# Patient Record
Sex: Male | Born: 1960 | Race: White | Hispanic: No | Marital: Married | Smoking: Former smoker
Health system: Southern US, Community
[De-identification: ages and names within clinical notes are randomized; demographics above are authoritative.]

## PROBLEM LIST (undated history)

## (undated) DIAGNOSIS — N200 Calculus of kidney: Secondary | ICD-10-CM

## (undated) DIAGNOSIS — K219 Gastro-esophageal reflux disease without esophagitis: Secondary | ICD-10-CM

## (undated) DIAGNOSIS — H8109 Meniere's disease, unspecified ear: Secondary | ICD-10-CM

## (undated) DIAGNOSIS — I4719 Other supraventricular tachycardia: Secondary | ICD-10-CM

## (undated) DIAGNOSIS — R0609 Other forms of dyspnea: Secondary | ICD-10-CM

## (undated) DIAGNOSIS — D649 Anemia, unspecified: Secondary | ICD-10-CM

## (undated) DIAGNOSIS — H5440 Blindness, one eye, unspecified eye: Secondary | ICD-10-CM

## (undated) DIAGNOSIS — R0600 Dyspnea, unspecified: Secondary | ICD-10-CM

## (undated) DIAGNOSIS — L299 Pruritus, unspecified: Secondary | ICD-10-CM

## (undated) DIAGNOSIS — B351 Tinea unguium: Secondary | ICD-10-CM

## (undated) DIAGNOSIS — I471 Supraventricular tachycardia: Secondary | ICD-10-CM

## (undated) DIAGNOSIS — K602 Anal fissure, unspecified: Secondary | ICD-10-CM

## (undated) DIAGNOSIS — R002 Palpitations: Secondary | ICD-10-CM

## (undated) DIAGNOSIS — R198 Other specified symptoms and signs involving the digestive system and abdomen: Secondary | ICD-10-CM

## (undated) DIAGNOSIS — H33051 Total retinal detachment, right eye: Secondary | ICD-10-CM

## (undated) DIAGNOSIS — R7303 Prediabetes: Secondary | ICD-10-CM

## (undated) DIAGNOSIS — Z8719 Personal history of other diseases of the digestive system: Secondary | ICD-10-CM

## (undated) DIAGNOSIS — M79641 Pain in right hand: Secondary | ICD-10-CM

## (undated) DIAGNOSIS — H9319 Tinnitus, unspecified ear: Secondary | ICD-10-CM

## (undated) DIAGNOSIS — G8929 Other chronic pain: Secondary | ICD-10-CM

## (undated) DIAGNOSIS — E291 Testicular hypofunction: Secondary | ICD-10-CM

## (undated) DIAGNOSIS — I10 Essential (primary) hypertension: Secondary | ICD-10-CM

## (undated) DIAGNOSIS — B356 Tinea cruris: Secondary | ICD-10-CM

## (undated) DIAGNOSIS — E042 Nontoxic multinodular goiter: Secondary | ICD-10-CM

## (undated) DIAGNOSIS — Z6830 Body mass index (BMI) 30.0-30.9, adult: Secondary | ICD-10-CM

## (undated) DIAGNOSIS — H3320 Serous retinal detachment, unspecified eye: Secondary | ICD-10-CM

## (undated) DIAGNOSIS — R079 Chest pain, unspecified: Secondary | ICD-10-CM

## (undated) DIAGNOSIS — N529 Male erectile dysfunction, unspecified: Secondary | ICD-10-CM

## (undated) HISTORY — DX: Pain in right hand: M79.641

## (undated) HISTORY — DX: Total retinal detachment, right eye: H33.051

## (undated) HISTORY — DX: Chest pain, unspecified: R07.9

## (undated) HISTORY — DX: Testicular hypofunction: E29.1

## (undated) HISTORY — DX: Other supraventricular tachycardia: I47.19

## (undated) HISTORY — DX: Male erectile dysfunction, unspecified: N52.9

## (undated) HISTORY — DX: Tinea unguium: B35.1

## (undated) HISTORY — DX: Nontoxic multinodular goiter: E04.2

## (undated) HISTORY — DX: Pruritus, unspecified: L29.9

## (undated) HISTORY — DX: Personal history of other diseases of the digestive system: Z87.19

## (undated) HISTORY — DX: Meniere's disease, unspecified ear: H81.09

## (undated) HISTORY — DX: Prediabetes: R73.03

## (undated) HISTORY — DX: Body mass index (BMI) 30.0-30.9, adult: Z68.30

## (undated) HISTORY — DX: Gastro-esophageal reflux disease without esophagitis: K21.9

## (undated) HISTORY — DX: Anemia, unspecified: D64.9

## (undated) HISTORY — DX: Serous retinal detachment, unspecified eye: H33.20

## (undated) HISTORY — DX: Palpitations: R00.2

## (undated) HISTORY — DX: Tinnitus, unspecified ear: H93.19

## (undated) HISTORY — DX: Other specified symptoms and signs involving the digestive system and abdomen: R19.8

## (undated) HISTORY — PX: APPENDECTOMY: SHX54

## (undated) HISTORY — PX: OTHER SURGICAL HISTORY: SHX169

## (undated) HISTORY — DX: Other forms of dyspnea: R06.09

## (undated) HISTORY — PX: BACK SURGERY: SHX140

## (undated) HISTORY — DX: Dyspnea, unspecified: R06.00

## (undated) HISTORY — DX: Anal fissure, unspecified: K60.2

## (undated) HISTORY — PX: COLONOSCOPY: SHX174

## (undated) HISTORY — DX: Supraventricular tachycardia: I47.1

## (undated) HISTORY — DX: Tinea cruris: B35.6

## (undated) HISTORY — DX: Other chronic pain: G89.29

---

## 1999-05-27 ENCOUNTER — Encounter: Payer: Self-pay | Admitting: Otolaryngology

## 1999-05-27 ENCOUNTER — Ambulatory Visit (HOSPITAL_COMMUNITY): Admission: RE | Admit: 1999-05-27 | Discharge: 1999-05-27 | Payer: Self-pay | Admitting: Otolaryngology

## 1999-11-22 ENCOUNTER — Ambulatory Visit (HOSPITAL_COMMUNITY): Admission: RE | Admit: 1999-11-22 | Discharge: 1999-11-22 | Payer: Self-pay | Admitting: Family Medicine

## 1999-11-22 ENCOUNTER — Encounter: Payer: Self-pay | Admitting: Family Medicine

## 2005-03-24 ENCOUNTER — Emergency Department (HOSPITAL_COMMUNITY): Admission: EM | Admit: 2005-03-24 | Discharge: 2005-03-25 | Payer: Self-pay | Admitting: Emergency Medicine

## 2011-01-05 ENCOUNTER — Encounter: Payer: Self-pay | Admitting: Otolaryngology

## 2012-08-22 ENCOUNTER — Encounter (HOSPITAL_COMMUNITY): Payer: Self-pay | Admitting: Emergency Medicine

## 2012-08-22 ENCOUNTER — Emergency Department (HOSPITAL_COMMUNITY)
Admission: EM | Admit: 2012-08-22 | Discharge: 2012-08-22 | Disposition: A | Discharge status: Home / Self Care | Payer: 59 | Attending: Emergency Medicine | Admitting: Emergency Medicine

## 2012-08-22 ENCOUNTER — Emergency Department (HOSPITAL_COMMUNITY): Payer: 59

## 2012-08-22 DIAGNOSIS — N200 Calculus of kidney: Secondary | ICD-10-CM | POA: Insufficient documentation

## 2012-08-22 DIAGNOSIS — N201 Calculus of ureter: Secondary | ICD-10-CM

## 2012-08-22 DIAGNOSIS — N1330 Unspecified hydronephrosis: Secondary | ICD-10-CM

## 2012-08-22 DIAGNOSIS — R911 Solitary pulmonary nodule: Secondary | ICD-10-CM

## 2012-08-22 LAB — CBC
HCT: 39.3 % (ref 39.0–52.0)
Hemoglobin: 14.2 g/dL (ref 13.0–17.0)
MCH: 31.1 pg (ref 26.0–34.0)
MCHC: 36.1 g/dL — ABNORMAL HIGH (ref 30.0–36.0)
MCV: 86 fL (ref 78.0–100.0)
Platelets: 233 10*3/uL (ref 150–400)
RBC: 4.57 MIL/uL (ref 4.22–5.81)
RDW: 12.1 % (ref 11.5–15.5)
WBC: 11.3 10*3/uL — ABNORMAL HIGH (ref 4.0–10.5)

## 2012-08-22 LAB — URINALYSIS, ROUTINE W REFLEX MICROSCOPIC
Bilirubin Urine: NEGATIVE
Glucose, UA: NEGATIVE mg/dL
Hgb urine dipstick: NEGATIVE
Ketones, ur: NEGATIVE mg/dL
Leukocytes, UA: NEGATIVE
Nitrite: NEGATIVE
Protein, ur: NEGATIVE mg/dL
Specific Gravity, Urine: 1.025 (ref 1.005–1.030)
Urobilinogen, UA: 0.2 mg/dL (ref 0.0–1.0)
pH: 5.5 (ref 5.0–8.0)

## 2012-08-22 LAB — COMPREHENSIVE METABOLIC PANEL
ALT: 16 U/L (ref 0–53)
AST: 18 U/L (ref 0–37)
Albumin: 4.4 g/dL (ref 3.5–5.2)
Alkaline Phosphatase: 48 U/L (ref 39–117)
BUN: 17 mg/dL (ref 6–23)
CO2: 25 mEq/L (ref 19–32)
Calcium: 9.2 mg/dL (ref 8.4–10.5)
Chloride: 100 mEq/L (ref 96–112)
Creatinine, Ser: 1.07 mg/dL (ref 0.50–1.35)
GFR calc Af Amer: >90 mL/min (ref >90)
GFR calc non Af Amer: 78 mL/min — ABNORMAL LOW (ref >90)
Glucose, Bld: 170 mg/dL — ABNORMAL HIGH (ref 70–99)
Potassium: 4 mEq/L (ref 3.5–5.1)
Sodium: 135 mEq/L (ref 135–145)
Total Bilirubin: 0.6 mg/dL (ref 0.3–1.2)
Total Protein: 7 g/dL (ref 6.0–8.3)

## 2012-08-22 LAB — LIPASE, BLOOD: Lipase: 63 U/L — ABNORMAL HIGH (ref 11–59)

## 2012-08-22 MED ORDER — SODIUM CHLORIDE 0.9 % IV SOLN: 1000.0000 mL | Freq: Once | INTRAVENOUS | Status: DC

## 2012-08-22 MED ORDER — FENTANYL CITRATE 0.05 MG/ML IJ SOLN
50.0000 ug | Freq: Once | INTRAMUSCULAR | Status: AC
  Administered 2012-08-22: 50 ug via INTRAVENOUS
  Filled 2012-08-22: qty 2

## 2012-08-22 MED ORDER — PROMETHAZINE HCL 25 MG/ML IJ SOLN
25.0000 mg | Freq: Once | INTRAMUSCULAR | Status: AC
  Administered 2012-08-22: 25 mg via INTRAVENOUS
  Filled 2012-08-22: qty 1

## 2012-08-22 MED ORDER — PROMETHAZINE HCL 25 MG PO TABS: 25.0000 mg | ORAL_TABLET | Freq: Four times a day (QID) | ORAL | Status: DC | PRN

## 2012-08-22 MED ORDER — OXYCODONE-ACETAMINOPHEN 5-325 MG PO TABS: 1.0000 | ORAL_TABLET | ORAL | Status: AC | PRN

## 2012-08-22 MED ORDER — SODIUM CHLORIDE 0.9 % IV SOLN
1000.0000 mL | Freq: Once | INTRAVENOUS | Status: AC
  Administered 2012-08-22: 1000 mL via INTRAVENOUS

## 2012-08-22 MED ORDER — TAMSULOSIN HCL 0.4 MG PO CAPS: 0.4000 mg | ORAL_CAPSULE | Freq: Every day | ORAL | Status: DC

## 2012-08-22 MED ORDER — HYDROMORPHONE HCL PF 1 MG/ML IJ SOLN
1.0000 mg | Freq: Once | INTRAMUSCULAR | Status: AC
  Administered 2012-08-22: 1 mg via INTRAVENOUS
  Filled 2012-08-22: qty 1

## 2012-08-22 MED ORDER — SODIUM CHLORIDE 0.9 % IV SOLN: 1000.0000 mL | INTRAVENOUS | Status: DC

## 2012-08-22 MED ORDER — OXYCODONE-ACETAMINOPHEN 5-325 MG PO TABS
2.0000 | ORAL_TABLET | Freq: Once | ORAL | Status: DC
  Filled 2012-08-22: qty 2

## 2012-08-22 MED ORDER — ONDANSETRON HCL 4 MG/2ML IJ SOLN
4.0000 mg | Freq: Once | INTRAMUSCULAR | Status: AC
  Administered 2012-08-22: 4 mg via INTRAVENOUS
  Filled 2012-08-22: qty 2

## 2012-08-22 NOTE — ED Notes (Signed)
Family at bedside. 

## 2012-08-22 NOTE — ED Provider Notes (Signed)
7:17 AM Assumed care of patient at change of shift.  Sign out received from Marlon Pel, PA-C.  Pt presenting with acute onset right testicular pain, US shows hydroceles and varicoceles.  Patient reports continued pain, have ordered dilaudid and phenergan.  States results to this point have been discussed with them.  Pending CT to r/o ureteral stone.   8:14 AM Pt resting comfortably, sleeping.  Discussed all results with wife and patient (though patient had difficulty staying awake).  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  Discussed return precautions with wife who verbalizes understanding and agrees with plan.    Results for orders placed during the hospital encounter of 08/22/12  URINALYSIS, ROUTINE W REFLEX MICROSCOPIC      Component Value Range   Color, Urine YELLOW  YELLOW   APPearance CLOUDY (*) CLEAR   Specific Gravity, Urine 1.025  1.005 - 1.030   pH 5.5  5.0 - 8.0   Glucose, UA NEGATIVE  NEGATIVE mg/dL   Hgb urine dipstick NEGATIVE  NEGATIVE   Bilirubin Urine NEGATIVE  NEGATIVE   Ketones, ur NEGATIVE  NEGATIVE mg/dL   Protein, ur NEGATIVE  NEGATIVE mg/dL   Urobilinogen, UA 0.2  0.0 - 1.0 mg/dL   Nitrite NEGATIVE  NEGATIVE   Leukocytes, UA NEGATIVE  NEGATIVE  CBC      Component Value Range   WBC 11.3 (*) 4.0 - 10.5 K/uL   RBC 4.57  4.22 - 5.81 MIL/uL   Hemoglobin 14.2  13.0 - 17.0 g/dL   HCT 91.4  78.2 - 95.6 %   MCV 86.0  78.0 - 100.0 fL   MCH 31.1  26.0 - 34.0 pg   MCHC 36.1 (*) 30.0 - 36.0 g/dL   RDW 21.3  08.6 - 57.8 %   Platelets 233  150 - 400 K/uL  COMPREHENSIVE METABOLIC PANEL      Component Value Range   Sodium 135  135 - 145 mEq/L   Potassium 4.0  3.5 - 5.1 mEq/L   Chloride 100  96 - 112 mEq/L   CO2 25  19 - 32 mEq/L   Glucose, Bld 170 (*) 70 - 99 mg/dL   BUN 17  6 - 23 mg/dL   Creatinine, Ser 4.69  0.50 - 1.35 mg/dL   Calcium 9.2  8.4 - 62.9 mg/dL     Total Protein 7.0  6.0 - 8.3 g/dL   Albumin 4.4  3.5 - 5.2 g/dL   AST 18  0 - 37 U/L   ALT 16  0 - 53 U/L   Alkaline Phosphatase 48  39 - 117 U/L   Total Bilirubin 0.6  0.3 - 1.2 mg/dL   GFR calc non Af Amer 78 (*) >90 mL/min   GFR calc Af Amer >90  >90 mL/min  LIPASE, BLOOD      Component Value Range   Lipase 63 (*) 11 - 59 U/L   Ct Abdomen Pelvis Wo Contrast  08/22/2012  *RADIOLOGY REPORT*  Clinical Data: Right testicular pain, vomiting, history of kidney stones  CT ABDOMEN AND PELVIS WITHOUT CONTRAST  Technique:  Multidetector CT imaging of the abdomen and pelvis was performed following the standard protocol without intravenous contrast.  Comparison: Testicular ultrasound performed today, 08/22/2012  Findings:  Lower Chest:  There is a 5 mm subpleural nodule in the right middle lobe on image 5  of series 6. The visualized lung bases are otherwise clear.  Visualized cardiac structures unremarkable.  Abdomen: Evaluation of the abdominal organs is limited in the absence of intravenous contrast material.  Given these limitations, the CT appearance of the stomach, duodenum, spleen, pancreas, adrenal glands, and liver are unremarkable. Gallbladder is unremarkable. No intra or extrahepatic biliary ductal dilatation.  Moderate right hydronephrosis with interstitial stranding in the perinephric space and around the renal pelvis.  There is mild ureterectasis leading up to with a 4 mm stone in the distal ureter just proximal to the UVJ.  No additional radiopaque renal calculi identified in the right kidney.  There are several small stones in the left kidney including a 3 mm stone in the upper pole collecting system and a tiny 1 - 2 mm stone in the interpolar collecting system.  There is mild - moderate left pelvicaliectasis without associated ureteral nephrosis. Some of the left pelvicaliectasis may in fact represent renal sinus cysts.  The ureter is normal caliber and no evidence of ureteral stone.   Normal-caliber large and small bowel throughout the abdomen without evidence of obstruction.  No significant colonic diverticular disease.  No free fluid in the abdomen, and no suspicious adenopathy.  Pelvis: 4 mm obstructing distal right ureteral stone just proximal to the UVJ.  The bladder is unremarkable in appearance as are the seminal vesicles.  Small central calcification in the prostate.  No free fluid or suspicious adenopathy in the pelvis.  Bones: No acute fracture or aggressive appearing lytic or blastic osseous lesion. Multilevel degenerative disc disease, most significant at L1-L2 where there is focal loss of disc space height, and reversal of the normal lumbar lordosis.  Vascular: Trace atherosclerotic vascular disease.  Evaluation significantly limited in the absence of intravenous contrast.  IMPRESSION:  1.  Obstructing 4 mm distal right ureteral stone with associated moderate hydronephrosis.  2.  Nonobstructing renal calculi in the left kidney measure up to 3 mm  3.  Query mild - moderate left hydronephrosis which may be chronic given the absence of associated ureteral dilatation.  Evaluation complicated by the presence of several renal sinus cysts.  In the absence of intravenous contrast, it is difficult to separate the renal sinus cysts from the collecting system.  4. A 5 mm sub pleural pulmonary nodule in the right middle lobe is highly likely to represent a subpleural lymph node.  However, additional follow-up is recommended to document stability.  If the patient is at high risk for bronchogenic carcinoma, follow-up chest CT at 6-12 months is recommended.  If the patient is at low risk for bronchogenic carcinoma, follow-up chest CT at 12 months is recommended.  This recommendation follows the consensus statement: Guidelines for Management of Small Pulmonary Nodules Detected on CT Scans: A Statement from the Fleischner Society as published in Radiology 2005; 237:395-400.   5.  Multilevel  degenerative disc disease in the lumbar spine most significant at L1-L2 and L2-L3.  6.  Trace atherosclerotic disease.   Original Report Authenticated By: Vilma Prader    US Scrotum  08/22/2012  *RADIOLOGY REPORT*  Clinical Data:  Right testicular pain.  No injury.  SCROTAL ULTRASOUND DOPPLER ULTRASOUND OF THE TESTICLES  Technique: Complete ultrasound examination of the testicles, epididymis, and other scrotal structures was performed.  Color and spectral Doppler ultrasound were also utilized to evaluate blood flow to the testicles.  Comparison:  None.  Findings:  Right testis:  The right testis measures 5.2 x 2.8 x 3.3 cm. Normal  homogeneous parenchymal echotexture.  Flow is demonstrated within the testis on color flow Doppler imaging.  No focal mass lesions.  Left testis:  The left testis measures 4.8 x 2.6 x 3.4 cm.  Normal homogeneous parenchymal echotexture.  Flow is demonstrated within the left testis on color flow Doppler imaging.  No focal mass lesions.  Right epididymis:  3 mm diameter right epididymal cyst or spermatic seal.  Low flow.  Left epididymis:  Normal in size and appearance.  Hydrocele:  Bilateral scrotal hydroceles are demonstrated.  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  Pulsed Doppler interrogation of both testes demonstrates low resistance flow bilaterally.  IMPRESSION: No evidence of testicular mass or torsion.  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   Original Report Authenticated By: Marlon Pel, M.D.    [place] Art/ven Flow Abd Pelv Doppler  08/22/2012  *RADIOLOGY REPORT*  Clinical Data:  Right testicular pain.  No injury.  SCROTAL ULTRASOUND DOPPLER ULTRASOUND OF THE TESTICLES  Technique: Complete ultrasound examination of the testicles, epididymis, and other scrotal structures was performed.  Color and spectral Doppler ultrasound were also utilized to evaluate blood flow to the testicles.  Comparison:  None.  Findings:  Right testis:   The right testis measures 5.2 x 2.8 x 3.3 cm. Normal homogeneous parenchymal echotexture.  Flow is demonstrated within the testis on color flow Doppler imaging.  No focal mass lesions.  Left testis:  The left testis measures 4.8 x 2.6 x 3.4 cm.  Normal homogeneous parenchymal echotexture.  Flow is demonstrated within the left testis on color flow Doppler imaging.  No focal mass lesions.  Right epididymis:  3 mm diameter right epididymal cyst or spermatic seal.  Low flow.  Left epididymis:  Normal in size and appearance.  Hydrocele:  Bilateral scrotal hydroceles are demonstrated.  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  Pulsed Doppler interrogation of both testes demonstrates low resistance flow bilaterally.  IMPRESSION: No evidence of testicular mass or torsion.  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   Original Report Authenticated By: Marlon Pel, M.D.       [place], [place] 08/22/12 [phone]

## 2012-08-22 NOTE — ED Notes (Signed)
Patient Ultrasound completed at bedside

## 2012-08-22 NOTE — ED Provider Notes (Signed)
Medical screening examination/treatment/procedure(s) were performed by non-physician practitioner and as supervising physician I was immediately available for consultation/collaboration.  Ambria Mayfield, MD 08/22/12 0718 

## 2012-08-22 NOTE — ED Notes (Signed)
Pt alert, arrives from home, c/o right testicle pain, onset was this evening, states radiates down right leg, resp even unlabored, skin pwd

## 2012-08-22 NOTE — ED Provider Notes (Signed)
History     CSN: [id number]  Arrival date & time 08/22/12  0224   First MD Initiated Contact with Patient 08/22/12 [phone]      Chief Complaint  Patient presents with  . Groin Pain    (Consider location/radiation/quality/duration/timing/severity/associated sxs/prior treatment) HPI  Pt to the ER from home after acute onset of right side testicular pain that woke him up from his sleep at midnight. He has had two episodes of vomiting. He feels as though his testicle is being pulled up into his abdomen. Normal bowel movement, no dysuria, no hematuria. His testicle does not hurt when palpated. Pt is uncomfortable but is in NAD at this time.  History reviewed. No pertinent past medical history.  History reviewed. No pertinent past surgical history.  No family history on file.  History  Substance Use Topics  . Smoking status: Never Smoker   . Smokeless tobacco: Not on file  . Alcohol Use: No      Review of Systems   Review of Systems  Gen: no weight loss, fevers, chills, night sweats  Eyes: no discharge or drainage, no occular pain or visual changes  Nose: no epistaxis or rhinorrhea  Mouth: no dental pain, no sore throat  Neck: no neck pain  Lungs:No wheezing, coughing or hemoptysis CV: no chest pain, palpitations, dependent edema or orthopnea  Abd: no abdominal pain, nausea, + vomiting  GU: no dysuria or gross hematuria, + testicular pain MSK:  No abnormalities  Neuro: no headache, no focal neurologic deficits  Skin: no abnormalities Psyche: negative.    Allergies  Review of patient's allergies indicates no known allergies.  Home Medications   Current Outpatient Rx  Name Route Sig Dispense Refill  . LISINOPRIL 10 MG PO TABS Oral Take 10 mg by mouth daily.      BP 119/75  Pulse 83  Temp 98.5 F (36.9 C) (Oral)  Resp 16  Wt 206 lb (93.441 kg)  SpO2 99%  Physical Exam  Nursing note and vitals reviewed. Constitutional: He appears well-developed and  well-nourished. No distress.  HENT:  Head: Normocephalic and atraumatic.  Eyes: Pupils are equal, round, and reactive to light.  Neck: Normal range of motion. Neck supple.  Cardiovascular: Normal rate and regular rhythm.   Pulmonary/Chest: Effort normal.  Abdominal: Soft.  Genitourinary: Testes normal and penis normal. Right testis shows no mass, no swelling and no tenderness. Right testis is descended. Cremasteric reflex is not absent on the right side. Left testis shows no mass, no swelling and no tenderness. Left testis is descended. Cremasteric reflex is not absent on the left side.  Neurological: He is alert.  Skin: Skin is warm and dry.    ED Course  Procedures (including critical care time)  Labs Reviewed  URINALYSIS, ROUTINE W REFLEX MICROSCOPIC - Abnormal; Notable for the following:    APPearance CLOUDY (*)     All other components within normal limits  CBC - Abnormal; Notable for the following:    WBC 11.3 (*)     MCHC 36.1 (*)     All other components within normal limits  COMPREHENSIVE METABOLIC PANEL - Abnormal; Notable for the following:    Glucose, Bld 170 (*)     GFR calc non Af Amer 78 (*)     All other components within normal limits  LIPASE, BLOOD - Abnormal; Notable for the following:    Lipase 63 (*)     All other components within normal limits   US Scrotum  08/22/2012  *RADIOLOGY REPORT*  Clinical Data:  Right testicular pain.  No injury.  SCROTAL ULTRASOUND DOPPLER ULTRASOUND OF THE TESTICLES  Technique: Complete ultrasound examination of the testicles, epididymis, and other scrotal structures was performed.  Color and spectral Doppler ultrasound were also utilized to evaluate blood flow to the testicles.  Comparison:  None.  Findings:  Right testis:  The right testis measures 5.2 x 2.8 x 3.3 cm. Normal homogeneous parenchymal echotexture.  Flow is demonstrated within the testis on color flow Doppler imaging.  No focal mass lesions.  Left testis:  The left  testis measures 4.8 x 2.6 x 3.4 cm.  Normal homogeneous parenchymal echotexture.  Flow is demonstrated within the left testis on color flow Doppler imaging.  No focal mass lesions.  Right epididymis:  3 mm diameter right epididymal cyst or spermatic seal.  Low flow.  Left epididymis:  Normal in size and appearance.  Hydrocele:  Bilateral scrotal hydroceles are demonstrated.  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  Pulsed Doppler interrogation of both testes demonstrates low resistance flow bilaterally.  IMPRESSION: No evidence of testicular mass or torsion.  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   Original Report Authenticated By: Marlon Pel, M.D.    [place] Art/ven Flow Abd Pelv Doppler  08/22/2012  *RADIOLOGY REPORT*  Clinical Data:  Right testicular pain.  No injury.  SCROTAL ULTRASOUND DOPPLER ULTRASOUND OF THE TESTICLES  Technique: Complete ultrasound examination of the testicles, epididymis, and other scrotal structures was performed.  Color and spectral Doppler ultrasound were also utilized to evaluate blood flow to the testicles.  Comparison:  None.  Findings:  Right testis:  The right testis measures 5.2 x 2.8 x 3.3 cm. Normal homogeneous parenchymal echotexture.  Flow is demonstrated within the testis on color flow Doppler imaging.  No focal mass lesions.  Left testis:  The left testis measures 4.8 x 2.6 x 3.4 cm.  Normal homogeneous parenchymal echotexture.  Flow is demonstrated within the left testis on color flow Doppler imaging.  No focal mass lesions.  Right epididymis:  3 mm diameter right epididymal cyst or spermatic seal.  Low flow.  Left epididymis:  Normal in size and appearance.  Hydrocele:  Bilateral scrotal hydroceles are demonstrated.  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  Pulsed Doppler interrogation of both testes demonstrates low resistance flow bilaterally.  IMPRESSION: No evidence of testicular mass or  torsion.  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   Original Report Authenticated By: Marlon Pel, M.D.      No diagnosis found.    MDM  After patient evaluated, STAT ultrasound of the testicle ordered.   Ultrasound of the testicles have come back normal, pt still in unbearable pain. CT abd/pelv wo contrast ordered to r/o atypical presentation of kidney stone.   End of shift patient hand off to [facility], PA-C. Pt needs Urology referral regardless of results.       Dorthula Matas, PA 08/22/12 [phone]

## 2012-08-23 NOTE — ED Provider Notes (Signed)
Medical screening examination/treatment/procedure(s) were performed by non-physician practitioner and as supervising physician I was immediately available for consultation/collaboration.  Mele Sylvester, MD 08/23/12 0049 

## 2012-08-24 ENCOUNTER — Inpatient Hospital Stay (HOSPITAL_COMMUNITY)
Admission: EM | Admit: 2012-08-24 | Discharge: 2012-08-27 | DRG: 854 | Disposition: A | Discharge status: Home / Self Care | Payer: 59 | Attending: Internal Medicine | Admitting: Internal Medicine

## 2012-08-24 ENCOUNTER — Inpatient Hospital Stay (HOSPITAL_COMMUNITY): Payer: 59 | Admitting: Anesthesiology

## 2012-08-24 ENCOUNTER — Encounter (HOSPITAL_COMMUNITY): Payer: Self-pay | Admitting: Anesthesiology

## 2012-08-24 ENCOUNTER — Emergency Department (HOSPITAL_COMMUNITY): Payer: 59

## 2012-08-24 ENCOUNTER — Other Ambulatory Visit: Payer: Self-pay | Admitting: Urology

## 2012-08-24 ENCOUNTER — Encounter (HOSPITAL_COMMUNITY): Payer: Self-pay

## 2012-08-24 ENCOUNTER — Encounter (HOSPITAL_COMMUNITY)
Admission: EM | Disposition: A | Discharge status: Home / Self Care | Payer: Self-pay | Source: Home / Self Care | Attending: Internal Medicine

## 2012-08-24 DIAGNOSIS — R6510 Systemic inflammatory response syndrome (SIRS) of non-infectious origin without acute organ dysfunction: Secondary | ICD-10-CM | POA: Diagnosis present

## 2012-08-24 DIAGNOSIS — E871 Hypo-osmolality and hyponatremia: Secondary | ICD-10-CM | POA: Diagnosis present

## 2012-08-24 DIAGNOSIS — N289 Disorder of kidney and ureter, unspecified: Secondary | ICD-10-CM

## 2012-08-24 DIAGNOSIS — Z9089 Acquired absence of other organs: Secondary | ICD-10-CM

## 2012-08-24 DIAGNOSIS — N1330 Unspecified hydronephrosis: Secondary | ICD-10-CM | POA: Diagnosis present

## 2012-08-24 DIAGNOSIS — A419 Sepsis, unspecified organism: Principal | ICD-10-CM | POA: Diagnosis present

## 2012-08-24 DIAGNOSIS — N390 Urinary tract infection, site not specified: Secondary | ICD-10-CM | POA: Diagnosis present

## 2012-08-24 DIAGNOSIS — I959 Hypotension, unspecified: Secondary | ICD-10-CM | POA: Clinically undetermined

## 2012-08-24 DIAGNOSIS — R109 Unspecified abdominal pain: Secondary | ICD-10-CM

## 2012-08-24 DIAGNOSIS — I10 Essential (primary) hypertension: Secondary | ICD-10-CM

## 2012-08-24 DIAGNOSIS — R000 Tachycardia, unspecified: Secondary | ICD-10-CM

## 2012-08-24 DIAGNOSIS — N201 Calculus of ureter: Secondary | ICD-10-CM | POA: Diagnosis present

## 2012-08-24 DIAGNOSIS — D72829 Elevated white blood cell count, unspecified: Secondary | ICD-10-CM | POA: Diagnosis present

## 2012-08-24 DIAGNOSIS — R1110 Vomiting, unspecified: Secondary | ICD-10-CM

## 2012-08-24 DIAGNOSIS — N179 Acute kidney failure, unspecified: Secondary | ICD-10-CM

## 2012-08-24 DIAGNOSIS — K5900 Constipation, unspecified: Secondary | ICD-10-CM | POA: Diagnosis present

## 2012-08-24 DIAGNOSIS — N200 Calculus of kidney: Secondary | ICD-10-CM | POA: Diagnosis present

## 2012-08-24 DIAGNOSIS — N132 Hydronephrosis with renal and ureteral calculous obstruction: Secondary | ICD-10-CM | POA: Diagnosis present

## 2012-08-24 DIAGNOSIS — Z79899 Other long term (current) drug therapy: Secondary | ICD-10-CM

## 2012-08-24 DIAGNOSIS — Z87442 Personal history of urinary calculi: Secondary | ICD-10-CM

## 2012-08-24 DIAGNOSIS — H5440 Blindness, one eye, unspecified eye: Secondary | ICD-10-CM | POA: Diagnosis present

## 2012-08-24 HISTORY — DX: Calculus of kidney: N20.0

## 2012-08-24 HISTORY — DX: Essential (primary) hypertension: I10

## 2012-08-24 HISTORY — PX: CYSTOSCOPY W/ URETERAL STENT PLACEMENT: SHX1429

## 2012-08-24 HISTORY — PX: CYSTOSCOPY/RETROGRADE/URETEROSCOPY: SHX5316

## 2012-08-24 HISTORY — DX: Blindness, one eye, unspecified eye: H54.40

## 2012-08-24 LAB — CBC WITH DIFFERENTIAL/PLATELET
Basophils Absolute: 0 10*3/uL (ref 0.0–0.1)
Basophils Relative: 0 % (ref 0–1)
Eosinophils Absolute: 0 10*3/uL (ref 0.0–0.7)
Eosinophils Relative: 0 % (ref 0–5)
HCT: 38.7 % — ABNORMAL LOW (ref 39.0–52.0)
Hemoglobin: 14.1 g/dL (ref 13.0–17.0)
Lymphocytes Relative: 4 % — ABNORMAL LOW (ref 12–46)
Lymphs Abs: 1 10*3/uL (ref 0.7–4.0)
MCH: 30.7 pg (ref 26.0–34.0)
MCHC: 36.4 g/dL — ABNORMAL HIGH (ref 30.0–36.0)
MCV: 84.1 fL (ref 78.0–100.0)
Monocytes Absolute: 3.2 10*3/uL — ABNORMAL HIGH (ref 0.1–1.0)
Monocytes Relative: 12 % (ref 3–12)
Neutro Abs: 22.7 10*3/uL — ABNORMAL HIGH (ref 1.7–7.7)
Neutrophils Relative %: 85 % — ABNORMAL HIGH (ref 43–77)
Platelets: 262 10*3/uL (ref 150–400)
RBC: 4.6 MIL/uL (ref 4.22–5.81)
RDW: 12.3 % (ref 11.5–15.5)
WBC: 26.9 10*3/uL — ABNORMAL HIGH (ref 4.0–10.5)

## 2012-08-24 LAB — URINALYSIS, ROUTINE W REFLEX MICROSCOPIC
Bilirubin Urine: NEGATIVE
Glucose, UA: NEGATIVE mg/dL
Ketones, ur: NEGATIVE mg/dL
Leukocytes, UA: NEGATIVE
Nitrite: NEGATIVE
Protein, ur: NEGATIVE mg/dL
Specific Gravity, Urine: 1.024 (ref 1.005–1.030)
Urobilinogen, UA: 0.2 mg/dL (ref 0.0–1.0)
pH: 6 (ref 5.0–8.0)

## 2012-08-24 LAB — MAGNESIUM: Magnesium: 2 mg/dL (ref 1.5–2.5)

## 2012-08-24 LAB — BASIC METABOLIC PANEL
BUN: 21 mg/dL (ref 6–23)
CO2: 25 mEq/L (ref 19–32)
Calcium: 9.1 mg/dL (ref 8.4–10.5)
Chloride: 88 mEq/L — ABNORMAL LOW (ref 96–112)
Creatinine, Ser: 1.38 mg/dL — ABNORMAL HIGH (ref 0.50–1.35)
GFR calc Af Amer: 66 mL/min — ABNORMAL LOW (ref >90)
GFR calc non Af Amer: 57 mL/min — ABNORMAL LOW (ref >90)
Glucose, Bld: 140 mg/dL — ABNORMAL HIGH (ref 70–99)
Potassium: 4.2 mEq/L (ref 3.5–5.1)
Sodium: 123 mEq/L — ABNORMAL LOW (ref 135–145)

## 2012-08-24 LAB — PHOSPHORUS: Phosphorus: 3.3 mg/dL (ref 2.3–4.6)

## 2012-08-24 LAB — TSH: TSH: 0.342 u[IU]/mL — ABNORMAL LOW (ref 0.350–4.500)

## 2012-08-24 LAB — MRSA PCR SCREENING: MRSA by PCR: NEGATIVE

## 2012-08-24 LAB — URINE MICROSCOPIC-ADD ON

## 2012-08-24 SURGERY — CYSTOSCOPY, WITH RETROGRADE PYELOGRAM AND URETERAL STENT INSERTION
Anesthesia: General | Site: Abdomen | Laterality: Right | Wound class: Clean Contaminated

## 2012-08-24 SURGERY — Surgical Case
Anesthesia: *Unknown

## 2012-08-24 MED ORDER — EPHEDRINE SULFATE 50 MG/ML IJ SOLN
INTRAMUSCULAR | Status: DC | PRN
  Administered 2012-08-24 (×2): 5 mg via INTRAVENOUS

## 2012-08-24 MED ORDER — SODIUM CHLORIDE 0.9 % IV SOLN: INTRAVENOUS | Status: DC

## 2012-08-24 MED ORDER — ONDANSETRON HCL 4 MG/2ML IJ SOLN
INTRAMUSCULAR | Status: DC | PRN
  Administered 2012-08-24: 4 mg via INTRAVENOUS

## 2012-08-24 MED ORDER — SENNA 8.6 MG PO TABS
1.0000 | ORAL_TABLET | Freq: Every day | ORAL | Status: DC | PRN
  Administered 2012-08-25: 8.6 mg via ORAL
  Filled 2012-08-24: qty 1

## 2012-08-24 MED ORDER — SODIUM CHLORIDE 0.9 % IJ SOLN
3.0000 mL | Freq: Two times a day (BID) | INTRAMUSCULAR | Status: DC
Start: 2012-08-24 — End: 2012-08-27

## 2012-08-24 MED ORDER — MORPHINE SULFATE 4 MG/ML IJ SOLN
4.0000 mg | Freq: Once | INTRAMUSCULAR | Status: AC
  Administered 2012-08-24: 4 mg via INTRAVENOUS
  Filled 2012-08-24: qty 1

## 2012-08-24 MED ORDER — MORPHINE SULFATE 2 MG/ML IJ SOLN: 1.0000 mg | INTRAMUSCULAR | Status: DC | PRN

## 2012-08-24 MED ORDER — BELLADONNA ALKALOIDS-OPIUM 16.2-60 MG RE SUPP
RECTAL | Status: AC
  Filled 2012-08-24: qty 1

## 2012-08-24 MED ORDER — LACTATED RINGERS IV SOLN
INTRAVENOUS | Status: DC
Start: 2012-08-24 — End: 2012-08-25

## 2012-08-24 MED ORDER — ONDANSETRON HCL 4 MG/2ML IJ SOLN: 4.0000 mg | Freq: Four times a day (QID) | INTRAMUSCULAR | Status: DC | PRN

## 2012-08-24 MED ORDER — DIPHENHYDRAMINE HCL 50 MG/ML IJ SOLN: 12.5000 mg | Freq: Four times a day (QID) | INTRAMUSCULAR | Status: DC | PRN

## 2012-08-24 MED ORDER — ACETAMINOPHEN 650 MG RE SUPP: 650.0000 mg | Freq: Four times a day (QID) | RECTAL | Status: DC | PRN

## 2012-08-24 MED ORDER — DEXTROSE 5 % IV SOLN
1.0000 g | Freq: Once | INTRAVENOUS | Status: AC
  Administered 2012-08-24: 1 g via INTRAVENOUS
  Filled 2012-08-24: qty 10

## 2012-08-24 MED ORDER — LIDOCAINE HCL 2 % EX GEL
CUTANEOUS | Status: AC
  Filled 2012-08-24: qty 10

## 2012-08-24 MED ORDER — PROPOFOL 10 MG/ML IV BOLUS
INTRAVENOUS | Status: DC | PRN
  Administered 2012-08-24: 170 mg via INTRAVENOUS
  Administered 2012-08-24: 30 mg via INTRAVENOUS

## 2012-08-24 MED ORDER — DEXTROSE-NACL 5-0.45 % IV SOLN
INTRAVENOUS | Status: DC
  Administered 2012-08-24: 100 mL/h via INTRAVENOUS
  Administered 2012-08-25: 06:00:00 via INTRAVENOUS

## 2012-08-24 MED ORDER — FENTANYL CITRATE 0.05 MG/ML IJ SOLN: 25.0000 ug | INTRAMUSCULAR | Status: DC | PRN

## 2012-08-24 MED ORDER — DOCUSATE SODIUM 100 MG PO CAPS
100.0000 mg | ORAL_CAPSULE | Freq: Every day | ORAL | Status: DC | PRN
  Filled 2012-08-24: qty 1

## 2012-08-24 MED ORDER — SODIUM CHLORIDE 0.9 % IV BOLUS (SEPSIS)
1000.0000 mL | Freq: Once | INTRAVENOUS | Status: AC
  Administered 2012-08-24: 1000 mL via INTRAVENOUS

## 2012-08-24 MED ORDER — ZOLPIDEM TARTRATE 5 MG PO TABS: 5.0000 mg | ORAL_TABLET | Freq: Every evening | ORAL | Status: DC | PRN

## 2012-08-24 MED ORDER — ACETAMINOPHEN 10 MG/ML IV SOLN
1000.0000 mg | Freq: Four times a day (QID) | INTRAVENOUS | Status: AC
  Administered 2012-08-25 (×4): 1000 mg via INTRAVENOUS
  Filled 2012-08-24 (×4): qty 100

## 2012-08-24 MED ORDER — IOHEXOL 300 MG/ML  SOLN
INTRAMUSCULAR | Status: AC
  Filled 2012-08-24: qty 1

## 2012-08-24 MED ORDER — FENTANYL CITRATE 0.05 MG/ML IJ SOLN
INTRAMUSCULAR | Status: DC | PRN
  Administered 2012-08-24: 50 ug via INTRAVENOUS
  Administered 2012-08-24: 100 ug via INTRAVENOUS

## 2012-08-24 MED ORDER — 0.9 % SODIUM CHLORIDE (POUR BTL) OPTIME
TOPICAL | Status: DC | PRN
  Administered 2012-08-24: 1000 mL

## 2012-08-24 MED ORDER — KCL IN DEXTROSE-NACL 20-5-0.45 MEQ/L-%-% IV SOLN: INTRAVENOUS | Status: DC

## 2012-08-24 MED ORDER — IBUPROFEN 200 MG PO TABS
600.0000 mg | ORAL_TABLET | Freq: Once | ORAL | Status: DC
  Filled 2012-08-24: qty 3

## 2012-08-24 MED ORDER — ONDANSETRON HCL 4 MG/2ML IJ SOLN
4.0000 mg | Freq: Once | INTRAMUSCULAR | Status: AC
  Administered 2012-08-24: 4 mg via INTRAVENOUS
  Filled 2012-08-24: qty 2

## 2012-08-24 MED ORDER — BELLADONNA ALKALOIDS-OPIUM 16.2-60 MG RE SUPP
RECTAL | Status: DC | PRN
  Administered 2012-08-24: 1 via RECTAL

## 2012-08-24 MED ORDER — MEPERIDINE HCL 50 MG/ML IJ SOLN: 6.2500 mg | INTRAMUSCULAR | Status: DC | PRN

## 2012-08-24 MED ORDER — SODIUM CHLORIDE 0.9 % IV SOLN
INTRAVENOUS | Status: DC | PRN
  Administered 2012-08-24 (×2): via INTRAVENOUS

## 2012-08-24 MED ORDER — KETOROLAC TROMETHAMINE 30 MG/ML IJ SOLN
30.0000 mg | Freq: Four times a day (QID) | INTRAMUSCULAR | Status: DC
  Administered 2012-08-24 – 2012-08-27 (×10): 30 mg via INTRAVENOUS
  Filled 2012-08-24 (×16): qty 1

## 2012-08-24 MED ORDER — SODIUM CHLORIDE 0.9 % IV SOLN
INTRAVENOUS | Status: AC
  Administered 2012-08-24: 16:00:00 via INTRAVENOUS

## 2012-08-24 MED ORDER — PROMETHAZINE HCL 25 MG/ML IJ SOLN: 6.2500 mg | INTRAMUSCULAR | Status: DC | PRN

## 2012-08-24 MED ORDER — OXYBUTYNIN CHLORIDE 5 MG PO TABS
5.0000 mg | ORAL_TABLET | Freq: Three times a day (TID) | ORAL | Status: DC | PRN
  Filled 2012-08-24: qty 1

## 2012-08-24 MED ORDER — CEFTRIAXONE SODIUM 1 G IJ SOLR
1.0000 g | INTRAMUSCULAR | Status: DC
  Administered 2012-08-25 – 2012-08-26 (×2): 1 g via INTRAVENOUS
  Filled 2012-08-24 (×3): qty 10

## 2012-08-24 MED ORDER — LIDOCAINE HCL (CARDIAC) 20 MG/ML IV SOLN
INTRAVENOUS | Status: DC | PRN
  Administered 2012-08-24: 80 mg via INTRAVENOUS

## 2012-08-24 MED ORDER — ACETAMINOPHEN 10 MG/ML IV SOLN
1000.0000 mg | Freq: Four times a day (QID) | INTRAVENOUS | Status: DC
  Administered 2012-08-24: 1000 mg via INTRAVENOUS
  Filled 2012-08-24 (×4): qty 100

## 2012-08-24 MED ORDER — ACETAMINOPHEN 325 MG PO TABS: 650.0000 mg | ORAL_TABLET | Freq: Four times a day (QID) | ORAL | Status: DC | PRN

## 2012-08-24 MED ORDER — KETOROLAC TROMETHAMINE 30 MG/ML IJ SOLN
INTRAMUSCULAR | Status: DC | PRN
  Administered 2012-08-24: 30 mg via INTRAVENOUS

## 2012-08-24 MED ORDER — HYDROMORPHONE HCL PF 1 MG/ML IJ SOLN: 0.5000 mg | INTRAMUSCULAR | Status: DC | PRN

## 2012-08-24 MED ORDER — DIPHENHYDRAMINE HCL 12.5 MG/5ML PO ELIX: 12.5000 mg | ORAL_SOLUTION | Freq: Four times a day (QID) | ORAL | Status: DC | PRN

## 2012-08-24 MED ORDER — SUCCINYLCHOLINE CHLORIDE 20 MG/ML IJ SOLN
INTRAMUSCULAR | Status: DC | PRN
  Administered 2012-08-24: 100 mg via INTRAVENOUS

## 2012-08-24 MED ORDER — SODIUM CHLORIDE 0.9 % IR SOLN
Status: DC | PRN
  Administered 2012-08-24: 3000 mL

## 2012-08-24 MED ORDER — ONDANSETRON HCL 4 MG PO TABS: 4.0000 mg | ORAL_TABLET | Freq: Four times a day (QID) | ORAL | Status: DC | PRN

## 2012-08-24 SURGICAL SUPPLY — 26 items
ADAPTER CATH URET PLST 4-6FR (CATHETERS) ×3 IMPLANT
ADPR CATH URET STRL DISP 4-6FR (CATHETERS) ×2
BAG URO CATCHER STRL LF (DRAPE) ×3 IMPLANT
BASKET STNLS GEMINI 4WIRE 3FR (BASKET) ×2 IMPLANT
BASKET ZERO TIP NITINOL 2.4FR (BASKET) IMPLANT
BSKT STON RTRVL GEM 120X11 3FR (BASKET) ×2
BSKT STON RTRVL ZERO TP 2.4FR (BASKET)
CATH INTERMIT  6FR 70CM (CATHETERS) ×3 IMPLANT
CATH URET 5FR 28IN OPEN ENDED (CATHETERS) ×3 IMPLANT
CLOTH BEACON ORANGE TIMEOUT ST (SAFETY) ×3 IMPLANT
DRAPE CAMERA CLOSED 9X96 (DRAPES) ×3 IMPLANT
GLOVE BIOGEL M STRL SZ7.5 (GLOVE) ×3 IMPLANT
GLOVE SURG SS PI 6.5 STRL IVOR (GLOVE) ×2 IMPLANT
GLOVE SURG SS PI 8.0 STRL IVOR (GLOVE) ×3 IMPLANT
GOWN PREVENTION PLUS XLARGE (GOWN DISPOSABLE) ×3 IMPLANT
GOWN STRL NON-REIN LRG LVL3 (GOWN DISPOSABLE) ×3 IMPLANT
GOWN STRL REIN XL XLG (GOWN DISPOSABLE) ×3 IMPLANT
GUIDEWIRE STR DUAL SENSOR (WIRE) ×3 IMPLANT
IV NS IRRIG 3000ML ARTHROMATIC (IV SOLUTION) ×4 IMPLANT
MANIFOLD NEPTUNE II (INSTRUMENTS) ×3 IMPLANT
MARKER SKIN DUAL TIP RULER LAB (MISCELLANEOUS) ×3 IMPLANT
NS IRRIG 1000ML POUR BTL (IV SOLUTION) ×3 IMPLANT
PACK CYSTO (CUSTOM PROCEDURE TRAY) ×3 IMPLANT
SCRUB PCMX 4 OZ (MISCELLANEOUS) ×1 IMPLANT
STENT CONTOUR 6FRX26X.038 (STENTS) ×2 IMPLANT
TUBING CONNECTING 10 (TUBING) ×3 IMPLANT

## 2012-08-24 NOTE — H&P (Signed)
Physician requesting consult: Caporossi  Reason for consult: Right ureteral stone with leukocytosis  History of Present Illness: Shawn [name] is a 51 y.o. cauc male with PMH significant for HTN who was initially evaled in the ED for RLQ and testicular pain on 08/22/12. He was found to have a right 4mm distal ureteral stone with moderate hydro. Pt was d/c'd home with pain medication and Flomax as well as a f/u appt with Dr. [name] in the office today at 1:00pm. Since that time the pt has been febrile at home with temp around 100 per his report . He was also having chills with intermittent vomiting and persistent lower abdominal discomfort. He states he became SOB last night and presented to the ED today for eval. Acute abd series reveals no bowel obstruction, increased stool in right colon, and possibly distal ureteral stone previously seen on CT although this could be a phlebolith. UA is not infected. His WBC is 26.9, NA 123, and Cr 1.38 (up from 1.07 on 08/22/12).  He currently denies chills, SOB, CP, N/V, diarrhea, hematuria, and dysuria. He has not had a BM since Sat 08/21/12.  IM is going to admit the pt to correct his medical issues. Urology has been consulted regarding probable infected ureteral stone.  He denies a history of voiding or storage urinary symptoms, hematuria, UTIs, STDs, or GU malignancy/trauma/surgery.  Past Medical History   Diagnosis  Date   .  Kidney stone    .  Hypertension    .  Blind right eye     Past Surgical History   Procedure  Date   .  Appendectomy    .  Right retina detachment     Current Hospital Medications:  Home Meds:  Prior to Admission medications   Medication  Sig  Start Date  End Date  Taking?  Authorizing Provider   lisinopril (PRINIVIL,ZESTRIL) 10 MG tablet  Take 10 mg by mouth daily.    Yes  Historical Provider, MD   oxyCODONE-acetaminophen (PERCOCET/ROXICET) 5-325 MG per tablet  Take 1-2 tablets by mouth every 4 (four) hours as needed for pain.  08/22/12   09/01/12  Yes  Trixie Dredge, PA   promethazine (PHENERGAN) 25 MG tablet  Take 1 tablet (25 mg total) by mouth every 6 (six) hours as needed for nausea.  08/22/12  08/29/12  Yes  Trixie Dredge, PA   Tamsulosin HCl (FLOMAX) 0.4 MG CAPS  Take 1 capsule (0.4 mg total) by mouth daily.  08/22/12   Yes  Trixie Dredge, PA   Scheduled Meds:  .  sodium chloride   Intravenous  STAT   .  acetaminophen  1,000 mg  Intravenous  Q6H   .  cefTRIAXone (ROCEPHIN) IV  1 g  Intravenous  Once   .  morphine injection  4 mg  Intravenous  Once   .  morphine injection  4 mg  Intravenous  Once   .  ondansetron (ZOFRAN) IV  4 mg  Intravenous  Once   .  sodium chloride  1,000 mL  Intravenous  Once   .  sodium chloride  1,000 mL  Intravenous  Once   .  DISCONTD: ibuprofen  600 mg  Oral  Once    Continuous Infusions:  PRN Meds:.  Allergies: No Known Allergies  History reviewed. No pertinent family history.  Social History: reports that he has never smoked. He has never used smokeless tobacco. He reports that he does not drink alcohol or use illicit  drugs.  ROS:  A complete review of systems was performed. All systems are negative except for pertinent findings as noted.  Physical Exam:  Vital signs in last 24 hours:  Temp: [98.2 F (36.8 C)-98.7 F (37.1 C)] 98.7 F (37.1 C) (09/10 1136)  Pulse Rate: [92-133] 92 (09/10 1136)  Resp: [18-26] 24 (09/10 1136)  BP: (121-138)/(69-81) 138/81 mmHg (09/10 1136)  SpO2: [96 %-100 %] 96 % (09/10 1136)  General: Alert and oriented, No acute distress  HEENT: Normocephalic, atraumatic  Neck: No JVD or lymphadenopathy  Cardiovascular: Regular rate and rhythm  Lungs: Clear bilaterally  Abdomen: Soft, nondistended, no abdominal masses; tender RLQ  Back: mild right CVA tenderness  Extremities: No edema  Neurologic: Grossly intact  Laboratory Data:   Basename  08/24/12 1022  08/22/12 0245   WBC  26.9*  11.3*   HGB  14.1  14.2   HCT  38.7*  39.3   PLT  262  233     Basename  08/24/12  1022  08/22/12 0245   NA  123*  135   K  4.2  4.0   CL  88*  100   GLUCOSE  140*  170*   BUN  21  17   CALCIUM  9.1  9.2   CREATININE  1.38*  1.07    Results for orders placed during the hospital encounter of 08/24/12 (from the past 24 hour(s))   BASIC METABOLIC PANEL Status: Abnormal    Collection Time    08/24/12 10:22 AM   Component  Value  Range    Sodium  123 (*)  135 - 145 mEq/L    Potassium  4.2  3.5 - 5.1 mEq/L    Chloride  88 (*)  96 - 112 mEq/L    CO2  25  19 - 32 mEq/L    Glucose, Bld  140 (*)  70 - 99 mg/dL    BUN  21  6 - 23 mg/dL    Creatinine, Ser  1.61 (*)  0.50 - 1.35 mg/dL    Calcium  9.1  8.4 - 10.5 mg/dL    GFR calc non Af Amer  57 (*)  >90 mL/min    GFR calc Af Amer  66 (*)  >90 mL/min   CBC WITH DIFFERENTIAL Status: Abnormal    Collection Time    08/24/12 10:22 AM   Component  Value  Range    WBC  26.9 (*)  4.0 - 10.5 K/uL    RBC  4.60  4.22 - 5.81 MIL/uL    Hemoglobin  14.1  13.0 - 17.0 g/dL    HCT  09.6 (*)  04.5 - 52.0 %    MCV  84.1  78.0 - 100.0 fL    MCH  30.7  26.0 - 34.0 pg    MCHC  36.4 (*)  30.0 - 36.0 g/dL    RDW  40.9  81.1 - 91.4 %    Platelets  262  150 - 400 K/uL    Neutrophils Relative  85 (*)  43 - 77 %    Neutro Abs  22.7 (*)  1.7 - 7.7 K/uL    Lymphocytes Relative  4 (*)  12 - 46 %    Lymphs Abs  1.0  0.7 - 4.0 K/uL    Monocytes Relative  12  3 - 12 %    Monocytes Absolute  3.2 (*)  0.1 - 1.0 K/uL    Eosinophils Relative  0  0 - 5 %    Eosinophils Absolute  0.0  0.0 - 0.7 K/uL    Basophils Relative  0  0 - 1 %    Basophils Absolute  0.0  0.0 - 0.1 K/uL   URINALYSIS, ROUTINE W REFLEX MICROSCOPIC Status: Abnormal    Collection Time    08/24/12 11:11 AM   Component  Value  Range    Color, Urine  AMBER (*)  YELLOW    APPearance  CLEAR  CLEAR    Specific Gravity, Urine  1.024  1.005 - 1.030    pH  6.0  5.0 - 8.0    Glucose, UA  NEGATIVE  NEGATIVE mg/dL    Hgb urine dipstick  TRACE (*)  NEGATIVE    Bilirubin Urine  NEGATIVE   NEGATIVE    Ketones, ur  NEGATIVE  NEGATIVE mg/dL    Protein, ur  NEGATIVE  NEGATIVE mg/dL    Urobilinogen, UA  0.2  0.0 - 1.0 mg/dL    Nitrite  NEGATIVE  NEGATIVE    Leukocytes, UA  NEGATIVE  NEGATIVE   URINE MICROSCOPIC-ADD ON Status: Normal    Collection Time    08/24/12 11:11 AM   Component  Value  Range    Squamous Epithelial / LPF  RARE  RARE    WBC, UA  0-2  <3 WBC/hpf    RBC / HPF  0-2  <3 RBC/hpf    Bacteria, UA  RARE  RARE    No results found for this or any previous visit (from the past 240 hour(s)).  Renal Function:   Basename  08/24/12 1022  08/22/12 0245   CREATININE  1.38*  1.07    CrCl is unknown because there is no height on file for the current visit.  Radiologic Imaging:  Dg Abd Acute W/chest  08/24/2012 *RADIOLOGY REPORT* Clinical Data: Right-sided abdominal pain for 3 days. History of kidney stones. ACUTE ABDOMEN SERIES (ABDOMEN 2 VIEW & CHEST 1 VIEW) Comparison: Abdomen and pelvis CT, 08/22/2012 Findings: A round stone projects in the right lower pelvis which is consistent in size and position with the distal ureteral stone seen on the recent prior CT. Other round calcifications in the pelvis are likely phleboliths. No definitive renal stone is evident. The soft tissues are otherwise unremarkable. There is mild increase stool most notable in the right colon. There is no bowel dilation to suggest obstruction. There are a few air-fluid levels on the erect view. Findings may reflect a mild adynamic ileus. No free air. The included frontal radiograph of the chest shows a right basilar lung opacity. This is new from the prior CT. It is likely atelectasis. Infiltrate should be considered in the proper clinical setting. The lungs otherwise clear. The bony thorax is intact. IMPRESSION: No evidence of a small bowel obstruction. Mild increase stool right colon and nonspecific air-fluid levels on the erect view suggests a mild adynamic ileus. Stone seen in the right distal ureter on  the recent CT is suggested in the right lower pelvis on the current study suggesting that the stone has not passed in the interval. It is possible that this is a phlebolith and not the distal ureteral stone seen on the recent prior CT. The right lung base opacity, likely atelectasis although infiltrate is possible. Original Report Authenticated By: Domenic Moras, M.D.   I independently reviewed the above imaging studies.  Impression/Assessment:  Distal right ureteral stone in pt with abnormal electrolytes and leukocytosis  Plan:  Admit per IM for electrolyte correction and treatment of constipation  Will schedule for cysto/possible right DJ stent placement/possible laser stone vaporization/possible stone extraction today with Dr. Patsi Sears  Rocephin 1g IV on call to OR  NPO  Consent to procedure  IV Tylenol for persistent abd pain and headache after Morphine  YARBROUGH,AMANDA G.  08/24/2012, 2:02 PM  Revision History.Marland KitchenMarland Kitchen

## 2012-08-24 NOTE — ED Provider Notes (Addendum)
History     CSN: [id number]  Arrival date & time 08/24/12  4098   First MD Initiated Contact with Patient 08/24/12 [phone]      Chief Complaint  Patient presents with  . Abdominal Pain    x1 day  . Constipation    x 3 days  . Nephrolithiasis    DX on Saturday here at [facility]  . Shortness of Breath    last night    (Consider location/radiation/quality/duration/timing/severity/associated sxs/prior treatment) Patient is a 51 y.o. male presenting with abdominal pain.  Abdominal Pain The primary symptoms of the illness include abdominal pain, fever, nausea and vomiting. The primary symptoms of the illness do not include shortness of breath or diarrhea.  Additional symptoms associated with the illness include constipation and hematuria. Symptoms associated with the illness do not include chills, diaphoresis or back pain.   51 year old, male, with history of appendectomy, and bilateral kidney stones, presents to emergency department complaining of abdominal pain, with nausea and vomiting, and no bowel movement for the past 3 days.  He has had fevers, and chills as well.  He denies blood in his emesis.  He denies cough, or shortness of breath.  He has had blood in his urine.  Level V caveat applies for severe pain  Past Medical History  Diagnosis Date  . Kidney stone   . Hypertension     No past surgical history on file.  No family history on file.  History  Substance Use Topics  . Smoking status: Never Smoker   . Smokeless tobacco: Not on file  . Alcohol Use: No      Review of Systems  Constitutional: Positive for fever. Negative for chills and diaphoresis.  Respiratory: Negative for cough and shortness of breath.   Cardiovascular: Negative for chest pain.  Gastrointestinal: Positive for nausea, vomiting, abdominal pain and constipation. Negative for diarrhea.  Genitourinary: Positive for hematuria.  Musculoskeletal: Negative for back pain.  Neurological: Negative for  headaches.  All other systems reviewed and are negative.    Allergies  Review of patient's allergies indicates no known allergies.  Home Medications   Current Outpatient Rx  Name Route Sig Dispense Refill  . LISINOPRIL 10 MG PO TABS Oral Take 10 mg by mouth daily.    . OXYCODONE-ACETAMINOPHEN 5-325 MG PO TABS Oral Take 1-2 tablets by mouth every 4 (four) hours as needed for pain. 20 tablet 0  . PROMETHAZINE HCL 25 MG PO TABS Oral Take 1 tablet (25 mg total) by mouth every 6 (six) hours as needed for nausea. 20 tablet 0  . TAMSULOSIN HCL 0.4 MG PO CAPS Oral Take 1 capsule (0.4 mg total) by mouth daily. 30 capsule 0    BP 132/73  Pulse 133  Temp 98.2 F (36.8 C) (Oral)  Resp 26  SpO2 97%  Physical Exam  Nursing note and vitals reviewed. Constitutional: He is oriented to person, place, and time. He appears well-developed and well-nourished. No distress.       Uncomfortable appearing holding lower abdomen  HENT:  Head: Normocephalic and atraumatic.  Eyes: Conjunctivae and EOM are normal.  Neck: Normal range of motion. Neck supple.  Cardiovascular: Regular rhythm and intact distal pulses.   No murmur heard.      Tachycardia  Pulmonary/Chest: Effort normal and breath sounds normal. No respiratory distress. He has no wheezes. He has no rales.  Abdominal: Soft. He exhibits no distension. There is tenderness. There is no rebound and no guarding.  Suprapubic and right lower quadrant tenderness, without peritoneal signs  Musculoskeletal: Normal range of motion. He exhibits no edema.  Neurological: He is alert and oriented to person, place, and time.  Skin: Skin is warm and dry.  Psychiatric: He has a normal mood and affect. Thought content normal.    ED Course  Procedures (including critical care time) 51 year old, male, with history of appendectomy, and bilateral kidney stones, presents emergency room with abdominal pain, nausea, and vomiting.  No bowel movement for 2 days.   We'll establish an IV give him analgesics and antiemetics, and perform laboratory testing, and an abdominal x-ray, to look for bowel obstruction.   Labs Reviewed  BASIC METABOLIC PANEL  CBC WITH DIFFERENTIAL  URINALYSIS, ROUTINE W REFLEX MICROSCOPIC   No results found.   No diagnosis found.  12:08 PM Pt still in pain  Discussed with RN for [facility].  She took data.  Will speak with him and call me back concerning who should admit pt.   1:01 PM Discussed with Dr. Patsi Sears.  He suggested medical admission.  Urology will consult.  1:10 PM Spoke with triad.  They will admit  CRITICAL CARE Performed by: Jasmine Mcbeth P   Total critical care time: 30 min  Critical care time was exclusive of separately billable procedures and treating other patients.  Critical care was necessary to treat or prevent imminent or life-threatening deterioration.  Critical care was time spent personally by me on the following activities: development of treatment plan with patient and/or surrogate as well as nursing, discussions with consultants, evaluation of patient's response to treatment, examination of patient, obtaining history from patient or surrogate, ordering and performing treatments and interventions, ordering and review of laboratory studies, ordering and review of radiographic studies, pulse oximetry and re-evaluation of patient's condition.   MDM  Abdominal pain, with nausea, and vomiting Hyponatremia Renal insufficiency leukocytosis        Cheri Guppy, MD 08/24/12 1209  Cheri Guppy, MD 08/24/12 1301  Cheri Guppy, MD 08/24/12 1311

## 2012-08-24 NOTE — ED Notes (Addendum)
Pt c/o of sudden on set of chest pain in triage- central chest only- pt c/o of pain to chest comes and goes

## 2012-08-24 NOTE — Progress Notes (Signed)
Shawn [name], is a 51 y.o. male,   MRN: [id number]  -  DOB - [birth year]  Outpatient Primary MD for the patient is No primary provider on file.  in for    Chief Complaint  Patient presents with  . Abdominal Pain    x1 day  . Constipation    x 3 days  . Nephrolithiasis    DX on Saturday here at [facility]  . Shortness of Breath    last night     Blood pressure 138/81, pulse 92, temperature 98.7 F (37.1 C), temperature source Oral, resp. rate 24, SpO2 96.00%.  Principal Problem:  *SIRS (systemic inflammatory response syndrome) Active Problems:  Hyponatremia  Kidney stone  Hypertension  Blind right eye  Nephrolithiasis  Hydronephrosis  Acute renal failure  51 yo hx kidney stone, HTN presents to ED cc abdominal pain, nausea, vomiting. Seen in ED 2 days ago and dx with kidney stone. Discharged with meds and follow up with urology. Symptoms worsened and developed fever, chills, constipation. Reports pain so intense he became SOB last evening. Denies CP, palpitation. Denies diarrhea, hematuria or coffee ground emesis.  In ED WC 26, sodium 123, Chloride 88 creatinine 1.3. Pt with HR 120's, respers 28, afebrile BP stable. CT abdomen/pelvis done 9/8 yields obstructing 4 mm distal right ureteral stone with associated moderate hydronephrosis.  Nonobstructing renal calculi in the left kidney measure up to 3 mm.  Query mild - moderate left hydronephrosis which may be chronic given the absence of associated ureteral dilatation. Evaluation complicated by the presence of several renal sinus cysts. In the absence of intravenous contrast, it is difficult to separate the renal sinus cysts from the collecting system.  A 5 mm sub pleural pulmonary nodule in the right middle lobe is highly likely to represent a subpleural lymph node. However, additional follow-up is recommended to document stability. If the patient is at high risk for bronchogenic carcinoma, follow-up chest  CT at 6-12 months is recommended. If  the patient is at low risk for bronchogenic carcinoma, follow-up chest CT at 12 months is recommended.  Multilevel degenerative disc disease in the lumbar spine most significant at L1-L2 and L2-L3.  Trace atherosclerotic disease. Acute abdominal xray with chest yields No evidence of a small bowel obstruction. Mild increase stool right colon and nonspecific air-fluid levels on the erect view suggests a mild adynamic ileus. Stone seen in the right distal ureter on the recent CT is suggested in the right lower pelvis on the current study suggesting that the stone has not passed in the interval. It is possible that this is a phlebolith and not the distal ureteral stone seen on the recent prior CT. The right lung base opacity, likely atelectasis although infiltrate is possible.  [place] of scrotum done 08/22/12 yields No evidence of testicular mass or torsion. 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   In ED given IV fluids at 150/hr after 1L bolus,  morphine x2 and zofran. ED MD spoke to urology and they will consult.   On exam pt alert, in obvious pain, ill appearing but not toxic. Concern for worsening symptoms/SIRS. Will admit to SD.

## 2012-08-24 NOTE — Anesthesia Postprocedure Evaluation (Signed)
  Anesthesia Post-op Note  Patient: Shawn [name]  Procedure(s) Performed: Procedure(s) (LRB): CYSTOSCOPY WITH RETROGRADE PYELOGRAM/URETERAL STENT PLACEMENT (Right) CYSTOSCOPY/RETROGRADE/URETEROSCOPY (Right)  Patient Location: PACU  Anesthesia Type: General  Level of Consciousness: awake and alert   Airway and Oxygen Therapy: Patient Spontanous Breathing  Post-op Pain: mild  Post-op Assessment: Post-op Vital signs reviewed, Patient's Cardiovascular Status Stable, Respiratory Function Stable, Patent Airway and No signs of Nausea or vomiting  Post-op Vital Signs: stable  Complications: No apparent anesthesia complications

## 2012-08-24 NOTE — H&P (Signed)
[facility] History and Physical  Shawn [name] DOB: 1961-02-04 DOA: 08/24/2012  Referring physician: Dr. Cheri Guppy PCP: Dr. Fulton Mole  Chief Complaint: Abdominal discomfort, nausea, constipation  HPI: Shawn [name] is a 51 y.o. male  Patient is a 51 y/o that was recently evaluated at the hospital for RLQ discomfort and testicular pain on 9/08.  Work up found a 4 mm distal ureteral stone with moderate hydro.  At that time patient was discharged with pain medication and flomax and he was instructed to f/u with Dr. [name] in the office today 9/10 at 1300.  He mentions that the pain got worse to the point that he was having difficulty breathing despite taking his pain medication and presented to the ED for further evaluation.  The problem was insidious since Saturday has persisted and got worse.  The problem is associated with nausea and poor oral intake since Sunday. Problem is characterized as a sharp pain that is non radiating in his abdomen. Denies any hematuria.  Also complaining of difficulty with bowel movements since this last weekend.    In the Ed was found to have an elevated WBC of 26.9, elevated initial HR of 133 which has since decreased to 100 with IVF's and pain medication, and CT of abdomen showed obstructing 4 mm distal right ureteral stone with moderate hydronephrosis as well as a non obstructing renal calculi in the left kidney measuring 3mm.  Urology was subsequently called for further evaluation and recommendations. They have made the patient NPO for possible procedural options today.  Review of Systems: The patient denies anorexia, + fever, weight loss,, vision loss, decreased hearing, hoarseness, chest pain, syncope, dyspnea on exertion, peripheral edema, balance deficits, hemoptysis, + abdominal pain, melena, hematochezia, severe indigestion/heartburn, hematuria, incontinence, genital sores, muscle weakness, suspicious skin lesions, transient  blindness, difficulty walking, depression, unusual weight change, abnormal bleeding, enlarged lymph nodes, angioedema, and breast masses.    Past Medical History  Diagnosis Date  . Kidney stone   . Hypertension   . Blind right eye    Past Surgical History  Procedure Date  . Appendectomy   . Right retina detachment    Social History:  reports that he has never smoked. He has never used smokeless tobacco. He reports that he does not drink alcohol or use illicit drugs. Lives at home with family  Can patient participate in ADLs? Yes  No Known Allergies  Family history: Reportedly mother has diabetes and father has had cardiac disease  Prior to Admission medications   Medication Sig Start Date End Date Taking? Authorizing Provider  lisinopril (PRINIVIL,ZESTRIL) 10 MG tablet Take 10 mg by mouth daily.   Yes Historical Provider, MD  oxyCODONE-acetaminophen (PERCOCET/ROXICET) 5-325 MG per tablet Take 1-2 tablets by mouth every 4 (four) hours as needed for pain. 08/22/12 09/01/12 Yes Trixie Dredge, PA  promethazine (PHENERGAN) 25 MG tablet Take 1 tablet (25 mg total) by mouth every 6 (six) hours as needed for nausea. 08/22/12 08/29/12 Yes Trixie Dredge, PA  Tamsulosin HCl (FLOMAX) 0.4 MG CAPS Take 1 capsule (0.4 mg total) by mouth daily. 08/22/12  Yes [place], [place]   Physical Exam: Filed Vitals:   08/24/12 1136 08/24/12 1424 08/24/12 1500 08/24/12 1507  BP: 138/81 119/72 125/74   Pulse: 92 100    Temp: 98.7 F (37.1 C)     TempSrc:      Resp: 24 20    Height:    6\' 1"  (1.854 m)  Weight:  91.7 kg (202 lb 2.6 oz)  SpO2: 96% 95%       General:  Pt looks uncomfortable but in NAD, Alert and Awake  Eyes: EOMI, non icteric  ENT: No masses on visual inspection, normal exterior appearance   Neck: no masses on visual inspection, no goiter  Cardiovascular: No MRG, RRR  Respiratory: Clear to auscultation, no wheezes  Abdomen: RLQ abdominal discomfort, positive bowel sounds  Skin: No rashes  or diaphoresis  Musculoskeletal: No cyanosis or clubbing  Psychiatric: Mood and affect appropriate  Neurologic: No focal neurological deficits, moves all extremities  Labs on Admission:  Basic Metabolic Panel:  Lab 08/24/12 1914 08/22/12 0245  NA 123* 135  K 4.2 4.0  CL 88* 100  CO2 25 25  GLUCOSE 140* 170*  BUN 21 17  CREATININE 1.38* 1.07  CALCIUM 9.1 9.2  MG -- --  PHOS -- --   Liver Function Tests:  Lab 08/22/12 0245  AST 18  ALT 16  ALKPHOS 48  BILITOT 0.6  PROT 7.0  ALBUMIN 4.4    Lab 08/22/12 0245  LIPASE 63*  AMYLASE --   No results found for this basename: AMMONIA:5 in the last 168 hours CBC:  Lab 08/24/12 1022 08/22/12 0245  WBC 26.9* 11.3*  NEUTROABS 22.7* --  HGB 14.1 14.2  HCT 38.7* 39.3  MCV 84.1 86.0  PLT 262 233   Cardiac Enzymes: No results found for this basename: CKTOTAL:5,CKMB:5,CKMBINDEX:5,TROPONINI:5 in the last 168 hours  BNP (last 3 results) No results found for this basename: PROBNP:3 in the last 8760 hours CBG: No results found for this basename: GLUCAP:5 in the last 168 hours  Radiological Exams on Admission: Dg Abd Acute W/chest  08/24/2012  *RADIOLOGY REPORT*  Clinical Data: Right-sided abdominal pain for 3 days.  History of kidney stones.  ACUTE ABDOMEN SERIES (ABDOMEN 2 VIEW & CHEST 1 VIEW)  Comparison: Abdomen and pelvis CT, 08/22/2012  Findings: A round stone projects in the right lower pelvis which is consistent in size and position with the distal ureteral stone seen on the recent prior CT.  Other round calcifications in the pelvis are likely phleboliths.  No definitive renal stone is evident.  The soft tissues are otherwise unremarkable.  There is mild increase stool most notable in the right colon. There is no bowel dilation to suggest obstruction.  There are a few air-fluid levels on the erect view.  Findings may reflect a mild adynamic ileus.  No free air.  The included frontal radiograph of the chest shows a right  basilar lung opacity.  This is new from the prior CT.  It is likely atelectasis.  Infiltrate should be considered in the proper clinical setting.  The lungs otherwise clear.  The bony thorax is intact.  IMPRESSION: No evidence of a small bowel obstruction.  Mild increase stool right colon and nonspecific air-fluid levels on the erect view suggests a mild adynamic ileus.  Stone seen in the right distal ureter on the recent CT is suggested in the right lower pelvis on the current study suggesting that the stone has not passed in the interval.  It is possible that this is a phlebolith and not the distal ureteral stone seen on the recent prior  CT.  The right lung base opacity, likely atelectasis although infiltrate is possible.   Original Report Authenticated By: Domenic Moras, M.D.     EKG: Independently reviewed. Normal sinus rhythm no st elevations or depressions  Assessment/Plan Principal Problem:  *  SIRS (systemic inflammatory response syndrome) Active Problems:  Hyponatremia  Kidney stone  Hypertension  Blind right eye  Nephrolithiasis  Hydronephrosis  Acute renal failure   1. Obstructing nephrolithiasis - Per urology - Continue NPO  2. Hyponatremia - Likely secondary to poor oral intake given history - Place on normal saline while patient is NPO.  Will avoid 1/2 normal saline as this could make patient's hyponatremia worse - Given poor oral intake history feel like this is most likely cause.  Should patient's condition not improve with normal saline would consider further work up with Urine/serum osmolality and urine sodium levels  3. ARF - At this point likely due to combination of obstructing nephrolithiasis and poor oral intake  - discontinue nephrotoxins lisinopril - check BMP next am - Provide IVF's and reassess  4. Constipation - Likely made worse secondary to poor oral intake and recent discomfort from nephrolithiasis.  Once patient can take po would consider encouraging  po intake with fluids, ambulation, senna and colace.    5. Leukocytosis - Likely secondary from stress of recent discomfort associated from nephrolithiasis - Would plan on continuing rocephin and reassessing need for expanding antibiotic regimen pending clinical scenario.   Code Status: full Family Communication: spoke with patient and family at bedside Disposition Plan: Pending clinical improvement  Time spent: > 65 minutes  Penny Pia [facility] Pager [phone]  If 7PM-7AM, please contact night-coverage [email or web address] Password [email or web address] 08/24/2012, 4:00 PM

## 2012-08-24 NOTE — Interval H&P Note (Signed)
History and Physical Interval Note:  08/24/2012 7:11 PM  Shawn [name]  has presented today for surgery, with the diagnosis of left ureteral obstuction  The various methods of treatment have been discussed with the patient and family. After consideration of risks, benefits and other options for treatment, the patient has consented to  Procedure(s) (LRB) with comments: CYSTOSCOPY WITH RETROGRADE PYELOGRAM/URETERAL STENT PLACEMENT (Right) CYSTOSCOPY/RETROGRADE/URETEROSCOPY (Right) HOLMIUM LASER APPLICATION (Right) as a surgical intervention .  The patient's history has been reviewed, patient examined, no change in status, stable for surgery.  I have reviewed the patient's chart and labs.  Questions were answered to the patient's satisfaction.     Jethro Bolus I

## 2012-08-24 NOTE — OR Nursing (Signed)
Posted for left , stone is on right. Surgery was done on the right side. Jeanell Sparrow, RN

## 2012-08-24 NOTE — ED Notes (Signed)
Report called to Amanda, RN.

## 2012-08-24 NOTE — Consult Note (Signed)
Urology Consult   Physician requesting consult: Caporossi  Reason for consult: Right ureteral stone with leukocytosis  History of Present Illness: Shawn [name] is a 51 y.o. cauc male with PMH significant for HTN who was initially evaled in the ED for RLQ and testicular pain on 08/22/12.  He was found to have a right 4mm distal ureteral stone with moderate hydro.  Pt was d/c'd home with pain medication and Flomax as well as a f/u appt with Dr. [name] in the office today at 1:00pm.  Since that time the pt has been febrile at home with temp around 100 per his report .  He was also having chills with intermittent vomiting and persistent lower abdominal discomfort.  He states he became SOB last night and presented to the ED today for eval. Acute abd series reveals no bowel obstruction, increased stool in right colon, and possibly distal ureteral stone previously seen on CT although this could be a phlebolith.  UA is not infected.  His WBC is 26.9, NA 123, and Cr 1.38 (up from 1.07 on 08/22/12).    He currently denies chills, SOB, CP, N/V, diarrhea, hematuria, and dysuria.  He has not had a BM since Sat 08/21/12.  IM is going to admit the pt to correct his medical issues.  Urology has been consulted regarding probable infected ureteral stone.    He denies a history of voiding or storage urinary symptoms, hematuria, UTIs, STDs, or GU malignancy/trauma/surgery.  Past Medical History  Diagnosis Date  . Kidney stone   . Hypertension   . Blind right eye     Past Surgical History  Procedure Date  . Appendectomy   . Right retina detachment     Current Hospital Medications:  Home Meds:  Prior to Admission medications   Medication Sig Start Date End Date Taking? Authorizing Provider  lisinopril (PRINIVIL,ZESTRIL) 10 MG tablet Take 10 mg by mouth daily.   Yes Historical Provider, MD  oxyCODONE-acetaminophen (PERCOCET/ROXICET) 5-325 MG per tablet Take 1-2 tablets by mouth every 4 (four) hours as needed  for pain. 08/22/12 09/01/12 Yes Trixie Dredge, PA  promethazine (PHENERGAN) 25 MG tablet Take 1 tablet (25 mg total) by mouth every 6 (six) hours as needed for nausea. 08/22/12 08/29/12 Yes Trixie Dredge, PA  Tamsulosin HCl (FLOMAX) 0.4 MG CAPS Take 1 capsule (0.4 mg total) by mouth daily. 08/22/12  Yes Trixie Dredge, PA     Scheduled Meds:   . sodium chloride   Intravenous STAT  . acetaminophen  1,000 mg Intravenous Q6H  . cefTRIAXone (ROCEPHIN)  IV  1 g Intravenous Once  .  morphine injection  4 mg Intravenous Once  .  morphine injection  4 mg Intravenous Once  . ondansetron (ZOFRAN) IV  4 mg Intravenous Once  . sodium chloride  1,000 mL Intravenous Once  . sodium chloride  1,000 mL Intravenous Once  . DISCONTD: ibuprofen  600 mg Oral Once   Continuous Infusions:  PRN Meds:.  Allergies: No Known Allergies  History reviewed. No pertinent family history.  Social History:  reports that he has never smoked. He has never used smokeless tobacco. He reports that he does not drink alcohol or use illicit drugs.  ROS: A complete review of systems was performed.  All systems are negative except for pertinent findings as noted.  Physical Exam:  Vital signs in last 24 hours: Temp:  [98.2 F (36.8 C)-98.7 F (37.1 C)] 98.7 F (37.1 C) (09/10 1136) Pulse Rate:  [92-133] 92  (  09/10 1136) Resp:  [18-26] 24  (09/10 1136) BP: (121-138)/(69-81) 138/81 mmHg (09/10 1136) SpO2:  [96 %-100 %] 96 % (09/10 1136) General:  Alert and oriented, No acute distress HEENT: Normocephalic, atraumatic Neck: No JVD or lymphadenopathy Cardiovascular: Regular rate and rhythm Lungs: Clear bilaterally Abdomen: Soft, nondistended, no abdominal masses; tender RLQ Back: mild right CVA tenderness Extremities: No edema Neurologic: Grossly intact  Laboratory Data:   Basename 08/24/12 1022 08/22/12 0245  WBC 26.9* 11.3*  HGB 14.1 14.2  HCT 38.7* 39.3  PLT 262 233     Basename 08/24/12 1022 08/22/12 0245  NA 123* 135    K 4.2 4.0  CL 88* 100  GLUCOSE 140* 170*  BUN 21 17  CALCIUM 9.1 9.2  CREATININE 1.38* 1.07     Results for orders placed during the hospital encounter of 08/24/12 (from the past 24 hour(s))  BASIC METABOLIC PANEL     Status: Abnormal   Collection Time   08/24/12 10:22 AM      Component Value Range   Sodium 123 (*) 135 - 145 mEq/L   Potassium 4.2  3.5 - 5.1 mEq/L   Chloride 88 (*) 96 - 112 mEq/L   CO2 25  19 - 32 mEq/L   Glucose, Bld 140 (*) 70 - 99 mg/dL   BUN 21  6 - 23 mg/dL   Creatinine, Ser 4.09 (*) 0.50 - 1.35 mg/dL   Calcium 9.1  8.4 - 81.1 mg/dL   GFR calc non Af Amer 57 (*) >90 mL/min   GFR calc Af Amer 66 (*) >90 mL/min  CBC WITH DIFFERENTIAL     Status: Abnormal   Collection Time   08/24/12 10:22 AM      Component Value Range   WBC 26.9 (*) 4.0 - 10.5 K/uL   RBC 4.60  4.22 - 5.81 MIL/uL   Hemoglobin 14.1  13.0 - 17.0 g/dL   HCT 91.4 (*) 78.2 - 95.6 %   MCV 84.1  78.0 - 100.0 fL   MCH 30.7  26.0 - 34.0 pg   MCHC 36.4 (*) 30.0 - 36.0 g/dL   RDW 21.3  08.6 - 57.8 %   Platelets 262  150 - 400 K/uL   Neutrophils Relative 85 (*) 43 - 77 %   Neutro Abs 22.7 (*) 1.7 - 7.7 K/uL   Lymphocytes Relative 4 (*) 12 - 46 %   Lymphs Abs 1.0  0.7 - 4.0 K/uL   Monocytes Relative 12  3 - 12 %   Monocytes Absolute 3.2 (*) 0.1 - 1.0 K/uL   Eosinophils Relative 0  0 - 5 %   Eosinophils Absolute 0.0  0.0 - 0.7 K/uL   Basophils Relative 0  0 - 1 %   Basophils Absolute 0.0  0.0 - 0.1 K/uL  URINALYSIS, ROUTINE W REFLEX MICROSCOPIC     Status: Abnormal   Collection Time   08/24/12 11:11 AM      Component Value Range   Color, Urine AMBER (*) YELLOW   APPearance CLEAR  CLEAR   Specific Gravity, Urine 1.024  1.005 - 1.030   pH 6.0  5.0 - 8.0   Glucose, UA NEGATIVE  NEGATIVE mg/dL   Hgb urine dipstick TRACE (*) NEGATIVE   Bilirubin Urine NEGATIVE  NEGATIVE   Ketones, ur NEGATIVE  NEGATIVE mg/dL   Protein, ur NEGATIVE  NEGATIVE mg/dL   Urobilinogen, UA 0.2  0.0 - 1.0 mg/dL    Nitrite NEGATIVE  NEGATIVE   Leukocytes,  UA NEGATIVE  NEGATIVE  URINE MICROSCOPIC-ADD ON     Status: Normal   Collection Time   08/24/12 11:11 AM      Component Value Range   Squamous Epithelial / LPF RARE  RARE   WBC, UA 0-2  <3 WBC/hpf   RBC / HPF 0-2  <3 RBC/hpf   Bacteria, UA RARE  RARE   No results found for this or any previous visit (from the past 240 hour(s)).  Renal Function:  Basename 08/24/12 1022 08/22/12 0245  CREATININE 1.38* 1.07   CrCl is unknown because there is no height on file for the current visit.  Radiologic Imaging: Dg Abd Acute W/chest  08/24/2012  *RADIOLOGY REPORT*  Clinical Data: Right-sided abdominal pain for 3 days.  History of kidney stones.  ACUTE ABDOMEN SERIES (ABDOMEN 2 VIEW & CHEST 1 VIEW)  Comparison: Abdomen and pelvis CT, 08/22/2012  Findings: A round stone projects in the right lower pelvis which is consistent in size and position with the distal ureteral stone seen on the recent prior CT.  Other round calcifications in the pelvis are likely phleboliths.  No definitive renal stone is evident.  The soft tissues are otherwise unremarkable.  There is mild increase stool most notable in the right colon. There is no bowel dilation to suggest obstruction.  There are a few air-fluid levels on the erect view.  Findings may reflect a mild adynamic ileus.  No free air.  The included frontal radiograph of the chest shows a right basilar lung opacity.  This is new from the prior CT.  It is likely atelectasis.  Infiltrate should be considered in the proper clinical setting.  The lungs otherwise clear.  The bony thorax is intact.  IMPRESSION: No evidence of a small bowel obstruction.  Mild increase stool right colon and nonspecific air-fluid levels on the erect view suggests a mild adynamic ileus.  Stone seen in the right distal ureter on the recent CT is suggested in the right lower pelvis on the current study suggesting that the stone has not passed in the interval.   It is possible that this is a phlebolith and not the distal ureteral stone seen on the recent prior  CT.  The right lung base opacity, likely atelectasis although infiltrate is possible.   Original Report Authenticated By: Domenic Moras, M.D.     I independently reviewed the above imaging studies.  Impression/Assessment:  Distal right ureteral stone in pt with abnormal electrolytes and leukocytosis  Plan:  Admit per IM for electrolyte correction and treatment of constipation Will schedule for cysto/possible right DJ stent placement/possible laser stone vaporization/possible stone extraction today with Dr. Patsi Sears Rocephin 1g IV on call to OR NPO Consent to procedure IV Tylenol for persistent abd pain and headache after Morphine  YARBROUGH,AMANDA G. 08/24/2012, 2:02 PM

## 2012-08-24 NOTE — ED Notes (Signed)
HR at 1000 am incorrect.  HR 91.

## 2012-08-24 NOTE — Transfer of Care (Signed)
Immediate Anesthesia Transfer of Care Note  Patient: Shawn [name]  Procedure(s) Performed: Procedure(s) (LRB) with comments: CYSTOSCOPY WITH RETROGRADE PYELOGRAM/URETERAL STENT PLACEMENT (Right) CYSTOSCOPY/RETROGRADE/URETEROSCOPY (Right)  Patient Location: PACU  Anesthesia Type: General  Level of Consciousness: awake, alert  and patient cooperative  Airway & Oxygen Therapy: Patient Spontanous Breathing and Patient connected to face mask oxygen  Post-op Assessment: Report given to PACU RN and Post -op Vital signs reviewed and stable  Post vital signs: Reviewed and stable  Complications: No apparent anesthesia complications

## 2012-08-24 NOTE — Op Note (Signed)
Pre-operative diagnosis : Obstructing right lower ureteral calculus with hydronephrosis and urosepsis  Postoperative diagnosis: Same  Operation: Cystourethroscopy, right retrograde pyelogram with interpretation, right ureteroscopy, basket extraction of right lower ureteral calculus, insertion of 6 French x 26 cm right double-J stent.  Surgeon:  Kathie Rhodes. Shawn Sears, MD  First assistant: None  Anesthesia:  general  Preparation: After appropriate preanesthesia, the patient was brought to the operating room, placed on the operating table in the dorsal supine position where general endotracheal anesthesia was introduced. The patient was replaced in the dorsal lithotomy position where the armband was double checked. The pubis was prepped with Betadine solution and draped in usual fashion.  Review history:: Shawn [name] is a 51 y.o. cauc male with PMH significant for HTN who was initially evaled in the ED for RLQ and testicular pain on 08/22/12. He was found to have a right 4mm distal ureteral stone with moderate hydro. Pt was d/c'd home with pain medication and Flomax as well as a f/u appt with Dr. [name] in the office today at 1:00pm. Since that time the pt has been febrile at home with temp around 100 per his report . He was also having chills with intermittent vomiting and persistent lower abdominal discomfort. He states he became SOB last night and presented to the ED today for eval. Acute abd series reveals no bowel obstruction, increased stool in right colon, and possibly distal ureteral stone previously seen on CT although this could be a phlebolith. UA is not infected. His WBC is 26.9, NA 123, and Cr 1.38 (up from 1.07 on 08/22/12).  He currently denies chills, SOB, CP, N/V, diarrhea, hematuria, and dysuria. He has not had a BM since Sat 08/21/12.  IM is going to admit the pt to correct his medical issues. Urology has been consulted regarding probable infected ureteral stone.  He denies a history of voiding  or storage urinary symptoms, hematuria, UTIs, STDs, or GU malignancy/trauma/surgery.    Statement of  Likelihood of Success: Excellent. TIME-OUT observed.:  Procedure: Cystourethroscopy was accomplished. The penile glans was normal. The pendulous urethra was normal. The proximal urethra was normal. The prostate was nonocclusive. The bladder neck was normal. The bladder base was normal. There was no evidence of bladder stone, tumor, or bladder diverticular formation. The trigone was normal. The right ureteral orifice appeared to be smaller than the left orifice. Retrograde PolyGram was difficult to perform because it was difficult to insert the 6 [place] open-ended catheter through the orifice. However, contrast was placed through the orifice, and retrograde pyelogram showed the 6 mm stone free-floating in the lower ureter. A guidewire was passed through the 6 [place] open-ended catheter, into the renal pelvis, and the open-end catheter was removed.  The short ureteroscope was then passed into the orifice, and negotiated around the wire, into the lower ureter with difficulty. The stone was identified after negotiating the ureteroscope into the mid ureter, and a basket was placed around the stone, and photodocumentation was accomplished. The stone was then dragged easily through the mid to lower ureter, which had been dilated with the ureteroscope, and removed. I then elected to place a double-J stent, and a 6 [place] by 26 cm double-J stent without the suture was then placed over the guidewire, and coiled under fluoroscopic control in the renal pelvis, and in the bladder. The patient received 30 mg of IV Toradol, was awakened, and taken to recovery room in good condition.

## 2012-08-24 NOTE — H&P (View-Only) (Signed)
ANTIBIOTIC CONSULT NOTE - INITIAL  Pharmacy Consult for Ceftriaxone Indication: Urinary coverage  No Known Allergies  Patient Measurements: Height: 6' 1" (185.4 cm) Weight: 202 lb 2.6 oz (91.7 kg) IBW/kg (Calculated) : 79.9   Vital Signs: Temp: 101.7 F (38.7 C) (09/10 1600) Temp src: Oral (09/10 1600) BP: 117/56 mmHg (09/10 1600) Pulse Rate: 96  (09/10 1600) Intake/Output from previous day:   Intake/Output from this shift: Total I/O In: 250 [I.V.:150; IV Piggyback:100] Out: 280 [Urine:280]  Labs:  Basename 08/24/12 1022 08/22/12 0245  WBC 26.9* 11.3*  HGB 14.1 14.2  PLT 262 233  LABCREA -- --  CREATININE 1.38* 1.07   Estimated Creatinine Clearance: 70.8 ml/min (by C-G formula based on Cr of 1.38). No results found for this basename: VANCOTROUGH:2,VANCOPEAK:2,VANCORANDOM:2,GENTTROUGH:2,GENTPEAK:2,GENTRANDOM:2,TOBRATROUGH:2,TOBRAPEAK:2,TOBRARND:2,AMIKACINPEAK:2,AMIKACINTROU:2,AMIKACIN:2, in the last 72 hours   Microbiology: Recent Results (from the past 720 hour(s))  MRSA PCR SCREENING     Status: Normal   Collection Time   08/24/12  3:14 PM      Component Value Range Status Comment   MRSA by PCR NEGATIVE  NEGATIVE Final     Medical History: Past Medical History  Diagnosis Date  . Kidney stone   . Hypertension   . Blind right eye    Assessment:  50 yof with obstructing nephrolithiasis.  MD would like to continue Ceftriaxone for urinary coverage.   Tmax 101.7, WBC 26.9.  NO cultures collected.  UA not impressive for infection.    MD noted to expand antibiotics as needed.  Patient received a dose of Rocephin at 1405 on 08/24/12.  Plan:   Rocephin 1gm IV q24h  Pharmacy will sign off since Rocephin is not dosed renally.    Aeon Kessner Thi 08/24/2012,4:46 PM  

## 2012-08-24 NOTE — ED Notes (Signed)
Pt presents with severe abdominal pain generalized. N/V since yesterday- pt c/o of constipation since Saturday making it difficulty to breath

## 2012-08-24 NOTE — Anesthesia Preprocedure Evaluation (Addendum)
Anesthesia Evaluation  Patient identified by MRN, date of birth, ID band Patient awake    Reviewed: Allergy & Precautions, H&P , NPO status , Patient's Chart, lab work & pertinent test results  Airway Mallampati: II TM Distance: >3 FB Neck ROM: Full    Dental No notable dental hx.    Pulmonary neg pulmonary ROS,  breath sounds clear to auscultation  Pulmonary exam normal       Cardiovascular hypertension, Pt. on medications negative cardio ROS  Rhythm:Regular Rate:Normal     Neuro/Psych negative neurological ROS  negative psych ROS   GI/Hepatic negative GI ROS, Neg liver ROS,   Endo/Other  negative endocrine ROS  Renal/GU negative Renal ROS  negative genitourinary   Musculoskeletal negative musculoskeletal ROS (+)   Abdominal   Peds negative pediatric ROS (+)  Hematology negative hematology ROS (+)   Anesthesia Other Findings   Reproductive/Obstetrics negative OB ROS                          Anesthesia Physical Anesthesia Plan  ASA: II  Anesthesia Plan: General   Post-op Pain Management:    Induction: Intravenous  Airway Management Planned: Oral ETT  Additional Equipment:   Intra-op Plan:   Post-operative Plan: Extubation in OR  Informed Consent: I have reviewed the patients History and Physical, chart, labs and discussed the procedure including the risks, benefits and alternatives for the proposed anesthesia with the patient or authorized representative who has indicated his/her understanding and acceptance.   Dental advisory given  Plan Discussed with: CRNA  Anesthesia Plan Comments:         Anesthesia Quick Evaluation  

## 2012-08-24 NOTE — Progress Notes (Signed)
ANTIBIOTIC CONSULT NOTE - INITIAL  Pharmacy Consult for Ceftriaxone Indication: Urinary coverage  No Known Allergies  Patient Measurements: Height: 6\' 1"  (185.4 cm) Weight: 202 lb 2.6 oz (91.7 kg) IBW/kg (Calculated) : 79.9   Vital Signs: Temp: 101.7 F (38.7 C) (09/10 1600) Temp src: Oral (09/10 1600) BP: 117/56 mmHg (09/10 1600) Pulse Rate: 96  (09/10 1600) Intake/Output from previous day:   Intake/Output from this shift: Total I/O In: 250 [I.V.:150; IV Piggyback:100] Out: 280 [Urine:280]  Labs:  [facility] 08/24/12 1022 08/22/12 0245  WBC 26.9* 11.3*  HGB 14.1 14.2  PLT 262 233  LABCREA -- --  CREATININE 1.38* 1.07   Estimated Creatinine Clearance: 70.8 ml/min (by C-G formula based on Cr of 1.38). No results found for this basename: VANCOTROUGH:2,VANCOPEAK:2,VANCORANDOM:2,GENTTROUGH:2,GENTPEAK:2,GENTRANDOM:2,TOBRATROUGH:2,TOBRAPEAK:2,TOBRARND:2,AMIKACINPEAK:2,AMIKACINTROU:2,AMIKACIN:2, in the last 72 hours   Microbiology: Recent Results (from the past 720 hour(s))  MRSA PCR SCREENING     Status: Normal   Collection Time   08/24/12  3:14 PM      Component Value Range Status Comment   MRSA by PCR NEGATIVE  NEGATIVE Final     Medical History: Past Medical History  Diagnosis Date  . Kidney stone   . Hypertension   . Blind right eye    Assessment:  32 yof with obstructing nephrolithiasis.  MD would like to continue Ceftriaxone for urinary coverage.   Tmax 101.7, WBC 26.9.  NO cultures collected.  UA not impressive for infection.    MD noted to expand antibiotics as needed.  Patient received a dose of Rocephin at 1405 on 08/24/12.  Plan:   Rocephin 1gm IV q24h  Pharmacy will sign off since Rocephin is not dosed renally.    Geoffry Paradise Thi 08/24/2012,4:46 PM

## 2012-08-24 NOTE — Preoperative (Signed)
Beta Blockers   Reason not to administer Beta Blockers:Not Applicable 

## 2012-08-25 ENCOUNTER — Inpatient Hospital Stay (HOSPITAL_COMMUNITY): Payer: 59

## 2012-08-25 ENCOUNTER — Encounter (HOSPITAL_COMMUNITY): Payer: Self-pay | Admitting: Urology

## 2012-08-25 DIAGNOSIS — R6510 Systemic inflammatory response syndrome (SIRS) of non-infectious origin without acute organ dysfunction: Secondary | ICD-10-CM

## 2012-08-25 DIAGNOSIS — N132 Hydronephrosis with renal and ureteral calculous obstruction: Secondary | ICD-10-CM | POA: Diagnosis present

## 2012-08-25 DIAGNOSIS — I959 Hypotension, unspecified: Secondary | ICD-10-CM

## 2012-08-25 LAB — CBC
HCT: 32.8 % — ABNORMAL LOW (ref 39.0–52.0)
Hemoglobin: 11.5 g/dL — ABNORMAL LOW (ref 13.0–17.0)
MCH: 30.6 pg (ref 26.0–34.0)
MCHC: 35.1 g/dL (ref 30.0–36.0)
MCV: 87.2 fL (ref 78.0–100.0)
Platelets: 199 10*3/uL (ref 150–400)
RBC: 3.76 MIL/uL — ABNORMAL LOW (ref 4.22–5.81)
RDW: 12.5 % (ref 11.5–15.5)
WBC: 16 10*3/uL — ABNORMAL HIGH (ref 4.0–10.5)

## 2012-08-25 LAB — BASIC METABOLIC PANEL
BUN: 26 mg/dL — ABNORMAL HIGH (ref 6–23)
CO2: 26 mEq/L (ref 19–32)
Calcium: 8 mg/dL — ABNORMAL LOW (ref 8.4–10.5)
Chloride: 99 mEq/L (ref 96–112)
Creatinine, Ser: 1.49 mg/dL — ABNORMAL HIGH (ref 0.50–1.35)
GFR calc Af Amer: 61 mL/min — ABNORMAL LOW (ref >90)
GFR calc non Af Amer: 52 mL/min — ABNORMAL LOW (ref >90)
Glucose, Bld: 109 mg/dL — ABNORMAL HIGH (ref 70–99)
Potassium: 4.2 mEq/L (ref 3.5–5.1)
Sodium: 132 mEq/L — ABNORMAL LOW (ref 135–145)

## 2012-08-25 LAB — URINALYSIS, ROUTINE W REFLEX MICROSCOPIC
Bilirubin Urine: NEGATIVE
Glucose, UA: NEGATIVE mg/dL
Nitrite: NEGATIVE
Protein, ur: 100 mg/dL — AB
Specific Gravity, Urine: 1.017 (ref 1.005–1.030)
Urobilinogen, UA: 0.2 mg/dL (ref 0.0–1.0)
pH: 6 (ref 5.0–8.0)

## 2012-08-25 LAB — CORTISOL: Cortisol, Plasma: 9 ug/dL

## 2012-08-25 LAB — LACTIC ACID, PLASMA: Lactic Acid, Venous: 1.5 mmol/L (ref 0.5–2.2)

## 2012-08-25 LAB — URINE MICROSCOPIC-ADD ON

## 2012-08-25 LAB — PROCALCITONIN: Procalcitonin: 0.55 ng/mL

## 2012-08-25 MED ORDER — IOHEXOL 300 MG/ML  SOLN
INTRAMUSCULAR | Status: DC | PRN
  Administered 2012-08-25: 10 mL

## 2012-08-25 MED ORDER — SODIUM CHLORIDE 0.9 % IV BOLUS (SEPSIS)
500.0000 mL | Freq: Once | INTRAVENOUS | Status: AC
  Administered 2012-08-25: 500 mL via INTRAVENOUS

## 2012-08-25 MED ORDER — SODIUM CHLORIDE 0.9 % IV SOLN
INTRAVENOUS | Status: DC
  Administered 2012-08-25: 125 mL/h via INTRAVENOUS
  Administered 2012-08-25 – 2012-08-26 (×3): via INTRAVENOUS
  Administered 2012-08-26: 1000 mL via INTRAVENOUS

## 2012-08-25 MED ORDER — BIOTENE DRY MOUTH MT LIQD
15.0000 mL | Freq: Two times a day (BID) | OROMUCOSAL | Status: DC
  Administered 2012-08-25 – 2012-08-26 (×3): 15 mL via OROMUCOSAL

## 2012-08-25 NOTE — Progress Notes (Signed)
CARE MANAGEMENT NOTE 08/25/2012  Patient:  Shawn [name], Shawn [name]   Account Number:  [id number]  Date Initiated:  08/25/2012  Documentation initiated by:  Parnell Spieler  Subjective/Objective Assessment:   sirs , pt seen in ed several days ago, now present with fever, elevated wbc and increased testicular pain     Action/Plan:   lives at home   Anticipated DC Date:  08/28/2012   Anticipated DC Plan:  HOME/SELF CARE  In-house referral  NA      DC Planning Services  NA      [facility] Choice  NA   Choice offered to / List presented to:  NA   DME arranged  NA      DME agency  NA     HH arranged  NA      HH agency  NA   Status of service:  In process, will continue to follow Medicare Important Message given?  NA - LOS <3 / Initial given by admissions (If response is "NO", the following Medicare IM given date fields will be blank) Date Medicare IM given:   Date Additional Medicare IM given:    Discharge Disposition:    Per UR Regulation:  Reviewed for med. necessity/level of care/duration of stay  If discussed at Long Length of Stay Meetings, dates discussed:    Comments:  [id number]/Alianah Lofton Earlene Plater, RN, BSN, CCM: CHART REVIEWED AND UPDATED. NO DISCHARGE NEEDS PRESENT AT THIS TIME. CASE MANAGEMENT [phone]

## 2012-08-25 NOTE — Progress Notes (Signed)
INITIAL ADULT NUTRITION ASSESSMENT Date: 08/25/2012   Time: 11:01 AM Reason for Assessment: Nutrition Risk   ASSESSMENT: Male 51 y.o.  Dx: SIRS (systemic inflammatory response syndrome)  INTERVENTION: 1. Will order patient snacks TID to increase PO intake.  2. RD to follow for nutrition plan of care.  Hx:  Past Medical History  Diagnosis Date  . Kidney stone   . Hypertension   . Blind right eye     Related Meds:  Scheduled Meds:   . sodium chloride   Intravenous STAT  . acetaminophen  1,000 mg Intravenous Q6H  . antiseptic oral rinse  15 mL Mouth Rinse BID  . cefTRIAXone (ROCEPHIN)  IV  1 g Intravenous Once  . cefTRIAXone (ROCEPHIN)  IV  1 g Intravenous Q24H  . ketorolac  30 mg Intravenous Q6H  .  morphine injection  4 mg Intravenous Once  .  morphine injection  4 mg Intravenous Once  . ondansetron (ZOFRAN) IV  4 mg Intravenous Once  . sodium chloride  1,000 mL Intravenous Once  . sodium chloride  1,000 mL Intravenous Once  . sodium chloride  500 mL Intravenous Once  . sodium chloride  500 mL Intravenous Once  . sodium chloride  3 mL Intravenous Q12H  . DISCONTD: acetaminophen  1,000 mg Intravenous Q6H  . DISCONTD: ibuprofen  600 mg Oral Once   Continuous Infusions:   . sodium chloride 125 mL/hr at 08/25/12 1017  . DISCONTD: sodium chloride 125 mL/hr at 08/24/12 1645  . DISCONTD: dextrose 5 % and 0.45 % NaCl with KCl 20 mEq/L Stopped (08/24/12 1630)  . DISCONTD: dextrose 5 % and 0.45% NaCl 100 mL/hr at 08/25/12 0620  . DISCONTD: lactated ringers     PRN Meds:.acetaminophen, diphenhydrAMINE, diphenhydrAMINE, fentaNYL, HYDROmorphone (DILAUDID) injection, meperidine (DEMEROL) injection, morphine injection, ondansetron (ZOFRAN) IV, ondansetron, oxybutynin, promethazine, senna, zolpidem, DISCONTD: 0.9 % irrigation (POUR BTL), DISCONTD: acetaminophen, DISCONTD: docusate sodium, DISCONTD: opium-belladonna, DISCONTD: sodium chloride irrigation   Ht: 6\' 1"  (185.4 cm)  Wt:  198 lb 13.7 oz (90.2 kg)  Ideal Wt: 83.6 kg % Ideal Wt: 107.6% Wt Readings from Last 10 Encounters:  08/25/12 198 lb 13.7 oz (90.2 kg)  08/25/12 198 lb 13.7 oz (90.2 kg)  08/22/12 206 lb (93.441 kg)  *Weight loss of 8 lb over 3 days, 3.8% from baseline.   Usual Wt: 217 lb per patient  % Usual Wt: 91.2%  Body mass index is 26.24 kg/(m^2). (Overweight)  Food/Nutrition Related Hx: Patient reported his appetite and intake have not been well over the past five days. He reported only bites of food at meals. Patient reported prior to 5 days ago his appetite and intake were very well. Patient diet advanced from clear to regular today. Patient reported his UBW ranges between 215-220 lb..   Labs:  CMP     Component Value Date/Time   NA 132* 08/25/2012 0306   K 4.2 08/25/2012 0306   CL 99 08/25/2012 0306   CO2 26 08/25/2012 0306   GLUCOSE 109* 08/25/2012 0306   BUN 26* 08/25/2012 0306   CREATININE 1.49* 08/25/2012 0306   CALCIUM 8.0* 08/25/2012 0306   PROT 7.0 08/22/2012 0245   ALBUMIN 4.4 08/22/2012 0245   AST 18 08/22/2012 0245   ALT 16 08/22/2012 0245   ALKPHOS 48 08/22/2012 0245   BILITOT 0.6 08/22/2012 0245   GFRNONAA 52* 08/25/2012 0306   GFRAA 61* 08/25/2012 0306    Intake/Output Summary (Last 24 hours) at 08/25/12 1103 Last data  filed at 08/25/12 0700  Gross per 24 hour  Intake 3811.67 ml  Output   1420 ml  Net 2391.67 ml  *Fluid retention noted.    Diet Order: General  Supplements/Tube Feeding: none at this time  IVF:    sodium chloride Last Rate: 125 mL/hr at 08/25/12 1017  DISCONTD: sodium chloride Last Rate: 125 mL/hr at 08/24/12 1645  DISCONTD: dextrose 5 % and 0.45 % NaCl with KCl 20 mEq/L Last Rate: Stopped (08/24/12 1630)  DISCONTD: dextrose 5 % and 0.45% NaCl Last Rate: 100 mL/hr at 08/25/12 0620  DISCONTD: lactated ringers     Estimated Nutritional Needs:   Kcal: 9604-5409 Protein: 90-108 grams  Fluid: 1 ml per kcal intake   NUTRITION DIAGNOSIS: -Inadequate oral  intake (NI-2.1).  Status: Ongoing  RELATED TO: poor appetite and weight loss  AS EVIDENCE BY: pt reports only bites at meals over the past 5 days and unintentional weight loss of 8 lb over past few days.   MONITORING/EVALUATION(Goals): PO intake/ tolerance, weights, labs, I/O's 1. PO intake > 75% at meals and supplements. 2. Promote weight maintenance.   EDUCATION NEEDS: -No education needs identified at this time  INTERVENTION: 1. Will order patient snacks TID to increase PO intake.  2. RD to follow for nutrition plan of care.   Dietitian [phone]  DOCUMENTATION CODES Per approved criteria  -Severe malnutrition in the context of acute illness or injury  *Patient meets malnutrition criteria due to >2 weight loss from baseline over 1 week and PO intake meeting < 50% of estimated energy needs over the past 5 days.   Iven Finn [facility] 08/25/2012, 11:01 AM

## 2012-08-25 NOTE — Progress Notes (Signed)
Urology Progress Note  1 Day Post-Op   Subjective: awake and alert. No pain.     No acute urologic events overnight. Ambulation:   negative Flatus:    negative Bowel movement  negative  Pain: complete resolution  Objective:  Blood pressure 90/54, pulse 77, temperature 98.6 F (37 C), temperature source Oral, resp. rate 16, height 6\' 1"  (1.854 m), weight 90.2 kg (198 lb 13.7 oz), SpO2 93.00%.  Physical Exam:  General:  No acute distress, awake Resp: clear to auscultation bilaterally Genitourinary:  Normal GU exam. Foley: no foley    I/O last 3 completed shifts: In: 3811.7 [I.V.:2511.7; Other:1000; IV Piggyback:300] Out: 1420 [Urine:1420]  Recent Labs  [facility] 08/25/12 0306 08/24/12 1022   HGB 11.5* 14.1   WBC 16.0* 26.9*   PLT 199 262    Recent Labs  Basename 08/25/12 0306 08/24/12 1022   NA 132* 123*   K 4.2 4.2   CL 99 88*   CO2 26 25   BUN 26* 21   CREATININE 1.49* 1.38*   CALCIUM 8.0* 9.1   GFRNONAA 52* 57*   GFRAA 61* 66*     No results found for this basename: PT:2,INR:2,APTT:2 in the last 72 hours   No components found with this basename: ABG:2  Assessment/Plan:  Pt with hypovolemia/hypotention/ sepsis responding to volume/antibiotics. Na 132 this AM, ( (123 yesterday) but Creatinine 1.49  ( 1.38 yesterday) ? ATN.  WBC 16,000 ( was 26.9) Hgb 11.5 ( was 14.1)  He feels much better this AM. He is beginning clear liquids.Will need ambulation. Improved sepsis, but drop in Hgb and elevation in Cr concerning this AM. Stone will be sent for analysis. Pt has Right JJ stent-will need to be removed in office in approximately 2 weeks.  Would continue Roecphin 1g/24 hrs.

## 2012-08-25 NOTE — Plan of Care (Signed)
Problem: Phase I Progression Outcomes Goal: Other Phase I Outcomes/Goals Outcome: Progressing Cough/turn/deep breathe - patient alert and oriented.  Using teach back method reviewed strategies to cough, turn and deep breathe, and use incentive spirometer to maintain lung expansion.

## 2012-08-25 NOTE — Progress Notes (Signed)
[facility] PROGRESS NOTE  Shawn [name] JYN:[id number] DOB: 07-28-1961 DOA: 08/24/2012 PCP: [name], MD  Assessment/Plan: Principal Problem:  *SIRS (systemic inflammatory response syndrome) Active Problems:  Hyponatremia  Kidney stone  Hypertension  Blind right eye  Nephrolithiasis  Hydronephrosis  Acute renal failure  Leukocytosis  Hypotension  Hydronephrosis with obstructing calculus  #1 systemic inflammatory response syndrome Likely secondary to all obstructing nephrolithiasis with possible UTI versus infectious etiology. Patient with hypotension this morning. Urinalysis which was done on admission was negative. Will repeat UA with cultures and sensitivities. Will check blood cultures x2. Will check a pro calcitonin level. Check a lactic acid level. Repeat chest x-ray. Leukocytosis is trending down. Patient is currently afebrile. Patient is status post double JJ stent. Continue empiric IV Rocephin. Follow.  #2 hypotension Likely secondary to an infectious etiology. Repeat UA with cultures and sensitivities. Check a chest x-ray. Check blood cultures x2. Check a pro calcitonin level. Check a lactic acid level. Check a random cortisol level. Hydrate with IV fluids. Continue empiric IV antibiotics.  #3 hyponatremia Likely secondary to hypovolemic hyponatremia. Will check a urine sodium and a urine creatinine. Change IV fluids to normal saline from D5 half normal. Follow.  #4 acute renal failure Likely secondary to a post renal azotemia secondary to obstructing nephrolithiasis in the setting of lisinopril and poor oral intake. Will check a urine sodium and a urine creatinine. Patient is status post double JJ stent. IV fluids. Follow.  #5 leukocytosis Likely secondary to obstructing nephrolithiasis. Will repeat UA with cultures and sensitivities. Check a portable chest x-ray. Check blood cultures x2. Check a pro calcitonin level. Check a lactic acid level.  Leukocytosis trending down. Continue empiric IV Rocephin.  #5 hydronephrosis with obstructing calculus Patient has been seen by urology. Patient is status post right double-J stent. Continue empiric IV Rocephin. Repeat UA with cultures and sensitivities. Per urology.  #6 hypertension Will hold BP medications secondary to problem #2.  #7 prophylaxis SCDs for DVT prophylaxis.   Code Status: Full Family Communication: Updated patient at bedside. Disposition Plan: Home when medically stable   Brief narrative: Patient is a 51 y/o that was recently evaluated at the hospital for RLQ discomfort and testicular pain on 9/08. Work up found a 4 mm distal ureteral stone with moderate hydro. At that time patient was discharged with pain medication and flomax and he was instructed to f/u with Dr. [name] in the office today 9/10 at 1300. He mentions that the pain got worse to the point that he was having difficulty breathing despite taking his pain medication and presented to the ED for further evaluation. The problem was insidious since Saturday has persisted and got worse. The problem is associated with nausea and poor oral intake since Sunday. Problem is characterized as a sharp pain that is non radiating in his abdomen. Denies any hematuria. Also complaining of difficulty with bowel movements since this last weekend.  In the Ed was found to have an elevated WBC of 26.9, elevated initial HR of 133 which has since decreased to 100 with IVF's and pain medication, and CT of abdomen showed obstructing 4 mm distal right ureteral stone with moderate hydronephrosis as well as a non obstructing renal calculi in the left kidney measuring 3mm.  Urology was subsequently called for further evaluation and recommendations. They have made the patient NPO for possible procedural options today.   Consultants:  Urology Dr. Patsi Sears 08/24/2012  Procedures:  Acute abdominal series 08/24/2012  Cystoscopy ureteroscopy,  right retrograde myelogram with interpretation, right keratoscopy, basket extraction of right lower ureteral calculus, insertion of 6 French x26 cm right double J stent 08/24/2012 per Dr. Patsi Sears  Antibiotics:  IV Rocephin 08/24/2012  HPI/Subjective: Patient states abdominal pain improved. Patient denies any chest pain. Patient denies any shortness of breath. Patient states has good urine output. No complaints.  Objective: Filed Vitals:   08/25/12 0600 08/25/12 0630 08/25/12 0700 08/25/12 0800  BP: 87/55 94/63 90/54  93/59  Pulse: 79 77  74  Temp:    97.9 F (36.6 C)  TempSrc:    Axillary  Resp: 20 15 16 19   Height:      Weight:      SpO2: 100% 100% 93% 98%    Intake/Output Summary (Last 24 hours) at 08/25/12 0905 Last data filed at 08/25/12 0700  Gross per 24 hour  Intake 3811.67 ml  Output   1420 ml  Net 2391.67 ml   Filed Weights   08/24/12 1507 08/25/12 0000  Weight: 91.7 kg (202 lb 2.6 oz) 90.2 kg (198 lb 13.7 oz)    Exam: General: Alert, awake, oriented x3, in no acute distress. HEENT: No bruits, no goiter. Heart: Regular rate and rhythm, without murmurs, rubs, gallops. Lungs: Left basilar coarse BS. No wheezing Abdomen: Soft, nontender, nondistended, positive bowel sounds. Extremities: No clubbing cyanosis or edema with positive pedal pulses. Neuro: Grossly intact, nonfocal.  Data Reviewed: Basic Metabolic Panel:  Lab 08/25/12 1610 08/24/12 1750 08/24/12 1022 08/22/12 0245  NA 132* -- 123* 135  K 4.2 -- 4.2 4.0  CL 99 -- 88* 100  CO2 26 -- 25 25  GLUCOSE 109* -- 140* 170*  BUN 26* -- 21 17  CREATININE 1.49* -- 1.38* 1.07  CALCIUM 8.0* -- 9.1 9.2  MG -- 2.0 -- --  PHOS -- 3.3 -- --   Liver Function Tests:  Lab 08/22/12 0245  AST 18  ALT 16  ALKPHOS 48  BILITOT 0.6  PROT 7.0  ALBUMIN 4.4    Lab 08/22/12 0245  LIPASE 63*  AMYLASE --   No results found for this basename: AMMONIA:5 in the last 168 hours CBC:  Lab 08/25/12 0306 08/24/12  1022 08/22/12 0245  WBC 16.0* 26.9* 11.3*  NEUTROABS -- 22.7* --  HGB 11.5* 14.1 14.2  HCT 32.8* 38.7* 39.3  MCV 87.2 84.1 86.0  PLT 199 262 233   Cardiac Enzymes: No results found for this basename: CKTOTAL:5,CKMB:5,CKMBINDEX:5,TROPONINI:5 in the last 168 hours BNP (last 3 results) No results found for this basename: PROBNP:3 in the last 8760 hours CBG: No results found for this basename: GLUCAP:5 in the last 168 hours  Recent Results (from the past 240 hour(s))  MRSA PCR SCREENING     Status: Normal   Collection Time   08/24/12  3:14 PM      Component Value Range Status Comment   MRSA by PCR NEGATIVE  NEGATIVE Final      Studies: Dg Abd Acute W/chest  08/24/2012  *RADIOLOGY REPORT*  Clinical Data: Right-sided abdominal pain for 3 days.  History of kidney stones.  ACUTE ABDOMEN SERIES (ABDOMEN 2 VIEW & CHEST 1 VIEW)  Comparison: Abdomen and pelvis CT, 08/22/2012  Findings: A round stone projects in the right lower pelvis which is consistent in size and position with the distal ureteral stone seen on the recent prior CT.  Other round calcifications in the pelvis are likely phleboliths.  No definitive renal stone is evident.  The soft tissues are otherwise  unremarkable.  There is mild increase stool most notable in the right colon. There is no bowel dilation to suggest obstruction.  There are a few air-fluid levels on the erect view.  Findings may reflect a mild adynamic ileus.  No free air.  The included frontal radiograph of the chest shows a right basilar lung opacity.  This is new from the prior CT.  It is likely atelectasis.  Infiltrate should be considered in the proper clinical setting.  The lungs otherwise clear.  The bony thorax is intact.  IMPRESSION: No evidence of a small bowel obstruction.  Mild increase stool right colon and nonspecific air-fluid levels on the erect view suggests a mild adynamic ileus.  Stone seen in the right distal ureter on the recent CT is suggested in the  right lower pelvis on the current study suggesting that the stone has not passed in the interval.  It is possible that this is a phlebolith and not the distal ureteral stone seen on the recent prior  CT.  The right lung base opacity, likely atelectasis although infiltrate is possible.   Original Report Authenticated By: Domenic Moras, M.D.     Scheduled Meds:    . sodium chloride   Intravenous STAT  . acetaminophen  1,000 mg Intravenous Q6H  . antiseptic oral rinse  15 mL Mouth Rinse BID  . cefTRIAXone (ROCEPHIN)  IV  1 g Intravenous Once  . cefTRIAXone (ROCEPHIN)  IV  1 g Intravenous Q24H  . ketorolac  30 mg Intravenous Q6H  .  morphine injection  4 mg Intravenous Once  .  morphine injection  4 mg Intravenous Once  . ondansetron (ZOFRAN) IV  4 mg Intravenous Once  . sodium chloride  1,000 mL Intravenous Once  . sodium chloride  1,000 mL Intravenous Once  . sodium chloride  500 mL Intravenous Once  . sodium chloride  500 mL Intravenous Once  . sodium chloride  3 mL Intravenous Q12H  . DISCONTD: acetaminophen  1,000 mg Intravenous Q6H  . DISCONTD: ibuprofen  600 mg Oral Once   Continuous Infusions:    . sodium chloride    . DISCONTD: sodium chloride 125 mL/hr at 08/24/12 1645  . DISCONTD: dextrose 5 % and 0.45 % NaCl with KCl 20 mEq/L Stopped (08/24/12 1630)  . DISCONTD: dextrose 5 % and 0.45% NaCl 100 mL/hr at 08/25/12 0620  . DISCONTD: lactated ringers      Principal Problem:  *SIRS (systemic inflammatory response syndrome) Active Problems:  Hyponatremia  Kidney stone  Hypertension  Blind right eye  Nephrolithiasis  Hydronephrosis  Acute renal failure  Leukocytosis  Hypotension  Hydronephrosis with obstructing calculus    Time spent: > 30 mins    [facility]  [facility] Pager [phone]. If 8PM-8AM, please contact night-coverage at [email or web address], password [facility] 08/25/2012, 9:05 AM  LOS: 1 day

## 2012-08-26 DIAGNOSIS — N390 Urinary tract infection, site not specified: Secondary | ICD-10-CM | POA: Diagnosis present

## 2012-08-26 LAB — CBC WITH DIFFERENTIAL/PLATELET
Basophils Absolute: 0 10*3/uL (ref 0.0–0.1)
Basophils Relative: 0 % (ref 0–1)
Eosinophils Absolute: 0.2 10*3/uL (ref 0.0–0.7)
Eosinophils Relative: 2 % (ref 0–5)
HCT: 32.8 % — ABNORMAL LOW (ref 39.0–52.0)
Hemoglobin: 11.3 g/dL — ABNORMAL LOW (ref 13.0–17.0)
Lymphocytes Relative: 12 % (ref 12–46)
Lymphs Abs: 1.2 10*3/uL (ref 0.7–4.0)
MCH: 30.2 pg (ref 26.0–34.0)
MCHC: 34.5 g/dL (ref 30.0–36.0)
MCV: 87.7 fL (ref 78.0–100.0)
Monocytes Absolute: 0.8 10*3/uL (ref 0.1–1.0)
Monocytes Relative: 8 % (ref 3–12)
Neutro Abs: 8.1 10*3/uL — ABNORMAL HIGH (ref 1.7–7.7)
Neutrophils Relative %: 79 % — ABNORMAL HIGH (ref 43–77)
Platelets: 214 10*3/uL (ref 150–400)
RBC: 3.74 MIL/uL — ABNORMAL LOW (ref 4.22–5.81)
RDW: 12.2 % (ref 11.5–15.5)
WBC: 10.2 10*3/uL (ref 4.0–10.5)

## 2012-08-26 LAB — URINE CULTURE
Colony Count: NO GROWTH
Culture: NO GROWTH

## 2012-08-26 LAB — BASIC METABOLIC PANEL
BUN: 21 mg/dL (ref 6–23)
CO2: 24 mEq/L (ref 19–32)
Calcium: 7.9 mg/dL — ABNORMAL LOW (ref 8.4–10.5)
Chloride: 104 mEq/L (ref 96–112)
Creatinine, Ser: 0.92 mg/dL (ref 0.50–1.35)
GFR calc Af Amer: >90 mL/min (ref >90)
GFR calc non Af Amer: >90 mL/min (ref >90)
Glucose, Bld: 96 mg/dL (ref 70–99)
Potassium: 4.2 mEq/L (ref 3.5–5.1)
Sodium: 135 mEq/L (ref 135–145)

## 2012-08-26 LAB — CREATININE, URINE, RANDOM: Creatinine, Urine: 98 mg/dL

## 2012-08-26 LAB — SODIUM, URINE, RANDOM: Sodium, Ur: <15 mEq/L

## 2012-08-26 NOTE — Progress Notes (Signed)
Urology Progress Note  2 Days Post-Op . Awake, alert. Discussed case with hospital ist.   Subjective:     No acute urologic events overnight. Ambulation:   positive Flatus:    positive Bowel movement  positive  Pain: some relief  Objective:  Blood pressure 125/79, pulse 83, temperature 98.8 F (37.1 C), temperature source Oral, resp. rate 19, height 6\' 1"  (1.854 m), weight 90.2 kg (198 lb 13.7 oz), SpO2 99.00%.  Physical Exam:  General:  No acute distress, awake Resp: clear to auscultation bilaterally Genitourinary:  Normal.  Foley:voiding spontaneously. ( has JJ stent)    I/O last 3 completed shifts: In: 5951.3 [I.V.:4501.3; Other:1000; IV Piggyback:450] Out: 3165 [Urine:3165]  Recent Labs  [facility] 08/26/12 0800 08/25/12 0306   HGB 11.3* 11.5*   WBC 10.2 16.0*   PLT 214 199    Recent Labs  Basename 08/26/12 0800 08/25/12 0306   NA 135 132*   K 4.2 4.2   CL 104 99   CO2 24 26   BUN 21 26*   CREATININE 0.92 1.49*   CALCIUM 7.9* 8.0*   GFRNONAA >90 52*   GFRAA >90 61*     No results found for this basename: PT:2,INR:2,APTT:2 in the last 72 hours   No components found with this basename: ABG:2  Assessment/Plan:  Follow up in 1 day. Agree with plan for transfer to floor, and possible d/c in AM on ceftin. He will RTC next week for JJ removal. He has had stone sent for analysis, and I wlil review with him at op visit.

## 2012-08-26 NOTE — Progress Notes (Signed)
Shawn [name] 1228 TRANSFERRED TO 1409- REPORT WAS CALLED TO KRISTYN RN. NO CHANGE IN HIS CONDITION. ON RA. DENIES PAIN. VSS.

## 2012-08-26 NOTE — Progress Notes (Signed)
[facility] PROGRESS NOTE  Shawn [name] WUJ:[id number] DOB: [birth year] DOA: 08/24/2012 PCP: Shawn Patella, MD  Assessment/Plan: Principal Problem:  *SIRS (systemic inflammatory response syndrome) Active Problems:  Hyponatremia  Kidney stone  Hypertension  Blind right eye  Nephrolithiasis  Hydronephrosis  Acute renal failure  Leukocytosis  Hypotension  Hydronephrosis with obstructing calculus  #1 systemic inflammatory response syndrome Likely secondary to all obstructing nephrolithiasis with possible UTI versus infectious etiology. Patient with hypotension yesterday and improved. Urinalysis which was done on admission was negative. Repeat UA c/w UTI. Blood cultures pending.  Repeat chest x-ray neg.. Leukocytosis is trending down. Patient is currently afebrile. Patient is status post double JJ stent. Continue empiric IV Rocephin. Follow.  #2 hypotension Likely secondary to an infectious etiology. Repeat UA c/w UTI.with cultures and sensitivities. Check a Chest x-ray negative. Bood cultures pending.  Improved with IVF. Continue empiric IV antibiotics.  #3 hyponatremia Likely secondary to hypovolemic hyponatremia. Urine sodium <15, improving with hydration. Decrease IVF. Follow.  #4 acute renal failure Likely secondary to pre renal azotemia with urine sodium  < 15 in setting of lisinopril and poor oral intake. Improved. Patient is status post double JJ stent. Decrease IV fluids. Follow.  #5 leukocytosis Likely secondary to obstructing nephrolithiasis. Repeat UA  C/w UTI, cultures pending. Chest x-ray neg. Blood cultures pending. Leukocytosis trending down. Continue empiric IV Rocephin.  #5 hydronephrosis with obstructing calculus Patient has been seen by urology. Patient is status post right double-J stent. Continue empiric IV Rocephin. Repeat UA c/w UTI. Cultures pending. Per urology.  #6 hypertension Will hold BP medications secondary to problem #2.  #7  prophylaxis SCDs for DVT prophylaxis.   Code Status: Full Family Communication: Updated patient at bedside. Disposition Plan: Transfer to telemetry. Home when medically stable   Brief narrative: Patient is a 51 y/o that was recently evaluated at the hospital for RLQ discomfort and testicular pain on 9/08. Work up found a 4 mm distal ureteral stone with moderate hydro. At that time patient was discharged with pain medication and flomax and he was instructed to f/u with Dr. [name] in the office today 9/10 at 1300. He mentions that the pain got worse to the point that he was having difficulty breathing despite taking his pain medication and presented to the ED for further evaluation. The problem was insidious since Saturday has persisted and got worse. The problem is associated with nausea and poor oral intake since Sunday. Problem is characterized as a sharp pain that is non radiating in his abdomen. Denies any hematuria. Also complaining of difficulty with bowel movements since this last weekend.  In the Ed was found to have an elevated WBC of 26.9, elevated initial HR of 133 which has since decreased to 100 with IVF's and pain medication, and CT of abdomen showed obstructing 4 mm distal right ureteral stone with moderate hydronephrosis as well as a non obstructing renal calculi in the left kidney measuring 3mm.  Urology was subsequently called for further evaluation and recommendations. They have made the patient NPO for possible procedural options today.   Consultants:  Urology Dr. Patsi Sears 08/24/2012  Procedures:  Acute abdominal series 08/24/2012  Cystoscopy ureteroscopy, right retrograde myelogram with interpretation, right keratoscopy, basket extraction of right lower ureteral calculus, insertion of 6 French x26 cm right double J stent 08/24/2012 per Dr. Patsi Sears  Antibiotics:  IV Rocephin 08/24/2012  HPI/Subjective: Patient states abdominal pain improved. Patient denies any  chest pain. Patient denies any shortness of breath. Patient states has  good urine output. No complaints.  Objective: Filed Vitals:   08/26/12 0400 08/26/12 0500 08/26/12 0600 08/26/12 0800  BP: 116/71 111/72 118/76   Pulse: 79 80 80   Temp: 99.6 F (37.6 C)   98.8 F (37.1 C)  TempSrc: Oral   Oral  Resp: 22 21 19    Height:      Weight:      SpO2: 95% 93% 99%     Intake/Output Summary (Last 24 hours) at 08/26/12 0903 Last data filed at 08/26/12 0600  Gross per 24 hour  Intake 2714.58 ml  Output   2175 ml  Net 539.58 ml   Filed Weights   08/24/12 1507 08/25/12 0000  Weight: 91.7 kg (202 lb 2.6 oz) 90.2 kg (198 lb 13.7 oz)    Exam: General: Alert, awake, oriented x3, in no acute distress. HEENT: No bruits, no goiter. Heart: Regular rate and rhythm, without murmurs, rubs, gallops. Lungs: CTAB Abdomen: Soft, nontender, nondistended, positive bowel sounds. Extremities: No clubbing cyanosis or edema with positive pedal pulses. Neuro: Grossly intact, nonfocal.  Data Reviewed: Basic Metabolic Panel:  Lab 08/26/12 1610 08/25/12 0306 08/24/12 1750 08/24/12 1022 08/22/12 0245  NA 135 132* -- 123* 135  K 4.2 4.2 -- 4.2 4.0  CL 104 99 -- 88* 100  CO2 24 26 -- 25 25  GLUCOSE 96 109* -- 140* 170*  BUN 21 26* -- 21 17  CREATININE 0.92 1.49* -- 1.38* 1.07  CALCIUM 7.9* 8.0* -- 9.1 9.2  MG -- -- 2.0 -- --  PHOS -- -- 3.3 -- --   Liver Function Tests:  Lab 08/22/12 0245  AST 18  ALT 16  ALKPHOS 48  BILITOT 0.6  PROT 7.0  ALBUMIN 4.4    Lab 08/22/12 0245  LIPASE 63*  AMYLASE --   No results found for this basename: AMMONIA:5 in the last 168 hours CBC:  Lab 08/26/12 0800 08/25/12 0306 08/24/12 1022 08/22/12 0245  WBC 10.2 16.0* 26.9* 11.3*  NEUTROABS 8.1* -- 22.7* --  HGB 11.3* 11.5* 14.1 14.2  HCT 32.8* 32.8* 38.7* 39.3  MCV 87.7 87.2 84.1 86.0  PLT 214 199 262 233   Cardiac Enzymes: No results found for this basename:  CKTOTAL:5,CKMB:5,CKMBINDEX:5,TROPONINI:5 in the last 168 hours BNP (last 3 results) No results found for this basename: PROBNP:3 in the last 8760 hours CBG: No results found for this basename: GLUCAP:5 in the last 168 hours  Recent Results (from the past 240 hour(s))  MRSA PCR SCREENING     Status: Normal   Collection Time   08/24/12  3:14 PM      Component Value Range Status Comment   MRSA by PCR NEGATIVE  NEGATIVE Final   CULTURE, BLOOD (ROUTINE X 2)     Status: Normal (Preliminary result)   Collection Time   08/25/12  9:15 AM      Component Value Range Status Comment   Specimen Description BLOOD RIGHT ARM   Final    Special Requests BOTTLES DRAWN AEROBIC AND ANAEROBIC 5CC   Final    Culture  Setup Time 08/25/2012 14:38   Final    Culture     Final    Value:        BLOOD CULTURE RECEIVED NO GROWTH TO DATE CULTURE WILL BE HELD FOR 5 DAYS BEFORE ISSUING A FINAL NEGATIVE REPORT   Report Status PENDING   Incomplete   CULTURE, BLOOD (ROUTINE X 2)     Status: Normal (Preliminary result)  Collection Time   08/25/12  9:25 AM      Component Value Range Status Comment   Specimen Description BLOOD RIGHT HAND   Final    Special Requests BOTTLES DRAWN AEROBIC AND ANAEROBIC [facility]   Final    Culture  Setup Time 08/25/2012 14:38   Final    Culture     Final    Value:        BLOOD CULTURE RECEIVED NO GROWTH TO DATE CULTURE WILL BE HELD FOR 5 DAYS BEFORE ISSUING A FINAL NEGATIVE REPORT   Report Status PENDING   Incomplete   URINE CULTURE     Status: Normal   Collection Time   08/25/12 10:42 AM      Component Value Range Status Comment   Specimen Description URINE, CLEAN CATCH   Final    Special Requests NONE   Final    Culture  Setup Time 08/25/2012 14:59   Final    Colony Count NO GROWTH   Final    Culture NO GROWTH   Final    Report Status 08/26/2012 FINAL   Final      Studies: Dg Chest Port 1 View  08/25/2012  *RADIOLOGY REPORT*  Clinical Data: Systemic inflammatory response syndrome.  Hypotension.  Productive cough.  Difficulty breathing.  PORTABLE CHEST - 1 VIEW 08/25/2012 0912 hours:  Comparison: One-view chest x-ray yesterday.  Findings: Suboptimal inspiration accounts for crowded bronchovascular markings, especially in the lung bases, and accentuates the cardiac silhouette.  Taking this into account, cardiomediastinal silhouette unremarkable for technique. Consolidation at the right lung base, unchanged.  New linear atelectasis at the left lung base.  Upper lungs remain clear.  IMPRESSION: Stable atelectasis versus pneumonia at the right lung base.  New linear atelectasis at the left lung base.   Original Report Authenticated By: Arnell Sieving, M.D.    Dg Abd Acute W/chest  08/24/2012  *RADIOLOGY REPORT*  Clinical Data: Right-sided abdominal pain for 3 days.  History of kidney stones.  ACUTE ABDOMEN SERIES (ABDOMEN 2 VIEW & CHEST 1 VIEW)  Comparison: Abdomen and pelvis CT, 08/22/2012  Findings: A round stone projects in the right lower pelvis which is consistent in size and position with the distal ureteral stone seen on the recent prior CT.  Other round calcifications in the pelvis are likely phleboliths.  No definitive renal stone is evident.  The soft tissues are otherwise unremarkable.  There is mild increase stool most notable in the right colon. There is no bowel dilation to suggest obstruction.  There are a few air-fluid levels on the erect view.  Findings may reflect a mild adynamic ileus.  No free air.  The included frontal radiograph of the chest shows a right basilar lung opacity.  This is new from the prior CT.  It is likely atelectasis.  Infiltrate should be considered in the proper clinical setting.  The lungs otherwise clear.  The bony thorax is intact.  IMPRESSION: No evidence of a small bowel obstruction.  Mild increase stool right colon and nonspecific air-fluid levels on the erect view suggests a mild adynamic ileus.  Stone seen in the right distal ureter on the  recent CT is suggested in the right lower pelvis on the current study suggesting that the stone has not passed in the interval.  It is possible that this is a phlebolith and not the distal ureteral stone seen on the recent prior  CT.  The right lung base opacity, likely atelectasis although infiltrate is  possible.   Original Report Authenticated By: Domenic Moras, M.D.     Scheduled Meds:    . acetaminophen  1,000 mg Intravenous Q6H  . antiseptic oral rinse  15 mL Mouth Rinse BID  . cefTRIAXone (ROCEPHIN)  IV  1 g Intravenous Q24H  . ketorolac  30 mg Intravenous Q6H  . sodium chloride  3 mL Intravenous Q12H   Continuous Infusions:    . sodium chloride 125 mL/hr at 08/26/12 0325    Principal Problem:  *SIRS (systemic inflammatory response syndrome) Active Problems:  Hyponatremia  Kidney stone  Hypertension  Blind right eye  Nephrolithiasis  Hydronephrosis  Acute renal failure  Leukocytosis  Hypotension  Hydronephrosis with obstructing calculus    Time spent: > 30 mins    [facility]  [facility] Pager [phone]. If 8PM-8AM, please contact night-coverage at [email or web address], password [facility] 08/26/2012, 9:03 AM  LOS: 2 days

## 2012-08-27 DIAGNOSIS — A419 Sepsis, unspecified organism: Secondary | ICD-10-CM

## 2012-08-27 LAB — CBC
HCT: 30.2 % — ABNORMAL LOW (ref 39.0–52.0)
Hemoglobin: 10.4 g/dL — ABNORMAL LOW (ref 13.0–17.0)
MCH: 30.1 pg (ref 26.0–34.0)
MCHC: 34.4 g/dL (ref 30.0–36.0)
MCV: 87.5 fL (ref 78.0–100.0)
Platelets: 244 10*3/uL (ref 150–400)
RBC: 3.45 MIL/uL — ABNORMAL LOW (ref 4.22–5.81)
RDW: 12 % (ref 11.5–15.5)
WBC: 9 10*3/uL (ref 4.0–10.5)

## 2012-08-27 LAB — BASIC METABOLIC PANEL
BUN: 19 mg/dL (ref 6–23)
CO2: 23 mEq/L (ref 19–32)
Calcium: 7.9 mg/dL — ABNORMAL LOW (ref 8.4–10.5)
Chloride: 107 mEq/L (ref 96–112)
Creatinine, Ser: 0.93 mg/dL (ref 0.50–1.35)
GFR calc Af Amer: >90 mL/min (ref >90)
GFR calc non Af Amer: >90 mL/min (ref >90)
Glucose, Bld: 107 mg/dL — ABNORMAL HIGH (ref 70–99)
Potassium: 3.9 mEq/L (ref 3.5–5.1)
Sodium: 137 mEq/L (ref 135–145)

## 2012-08-27 MED ORDER — CEFUROXIME AXETIL 500 MG PO TABS: 500.0000 mg | ORAL_TABLET | Freq: Two times a day (BID) | ORAL | Status: AC

## 2012-08-27 MED ORDER — OXYBUTYNIN CHLORIDE 5 MG PO TABS: 5.0000 mg | ORAL_TABLET | Freq: Three times a day (TID) | ORAL | Status: DC | PRN

## 2012-08-27 MED ORDER — CEFUROXIME AXETIL 500 MG PO TABS
500.0000 mg | ORAL_TABLET | Freq: Two times a day (BID) | ORAL | Status: DC
  Administered 2012-08-27: 500 mg via ORAL
  Filled 2012-08-27 (×2): qty 1

## 2012-08-27 NOTE — Progress Notes (Signed)
TO WHOM IT MAY CONCERN  Mr. Wise Fees was admitted to [facility] in [place] and under my care from 08/24/2012 through 08/27/2012. Mr. Trompeter will need to be out of work until 09/02/2012 after he has been assessed by his urologist Dr. Patsi Sears. Please excuse him for this abscence. You may call [phone] if you have any questions. Thank you for attention to this matter.   Yours sincerely,   Ramiro Harvest, M.D.

## 2012-08-27 NOTE — Discharge Summary (Signed)
Physician Discharge Summary  Shawn [name] ZHY:[id number] DOB: [birth year] DOA: 08/24/2012  PCP: [name], MD  Admit date: 08/24/2012 Discharge date: 08/27/2012  Recommendations for Outpatient Follow-up:  1. Followup with urology Dr. Patsi Sears next week for stent removal and further management. 2. Patient is to followup with PCP one week post discharge on followup will need a basic metabolic profile done to followup on his electrolytes and renal function. Patient blood pressure will also need to be reassessed at that time. Patient will also need a CBC done to followup on his leukocytosis.  Discharge Diagnoses:  Principal Problem:  *Sepsis Active Problems:  SIRS (systemic inflammatory response syndrome)  Hyponatremia  Kidney stone  Hypertension  Blind right eye  Nephrolithiasis  Hydronephrosis  Acute renal failure  Leukocytosis  Hypotension  Hydronephrosis with obstructing calculus  UTI (lower urinary tract infection)   Discharge Condition: Stable and improved  Diet recommendation: Regular  Filed Weights   08/24/12 1507 08/25/12 0000  Weight: 91.7 kg (202 lb 2.6 oz) 90.2 kg (198 lb 13.7 oz)    History of present illness:  Patient is a 51 y/o that was recently evaluated at the hospital for RLQ discomfort and testicular pain on 9/08. Work up found a 4 mm distal ureteral stone with moderate hydro. At that time patient was discharged with pain medication and flomax and he was instructed to f/u with Dr. [name] in the office today 9/10 at 1300. He mentions that the pain got worse to the point that he was having difficulty breathing despite taking his pain medication and presented to the ED for further evaluation. The problem was insidious since Saturday has persisted and got worse. The problem is associated with nausea and poor oral intake since Sunday. Problem is characterized as a sharp pain that is non radiating in his abdomen. Denies any hematuria. Also complaining of  difficulty with bowel movements since this last weekend.  In the Ed was found to have an elevated WBC of 26.9, elevated initial HR of 133 which has since decreased to 100 with IVF's and pain medication, and CT of abdomen showed obstructing 4 mm distal right ureteral stone with moderate hydronephrosis as well as a non obstructing renal calculi in the left kidney measuring 3mm.  Urology was subsequently called for further evaluation and recommendations. They have made the patient NPO for possible procedural options today.      Hospital Course:  #1 sepsis/systemic inflammatory response syndrome  On admission patient was noted to have an elevated white count of 26.9, heart rate of 133 prior to IV fluids which improved to 100 also did have subjective fevers and a CT of the abdomen and pelvis showing an obstructing 4 mm distal right ureteral stone with moderate hydronephrosis as well as a non-obstructing renal calculi in the left kidney measuring 3 mm. It was felt patient was a likely septic/systemic inflammatory response syndrome secondary to obstructing nephrolithiasis with possible UTI. Patient was placed on empiric IV Rocephin and admitted to the step down unit. Blood cultures were obtained and were pending to date. Chest x-ray which was obtained was negative for any acute infection. Patient was seen in consultation by Dr. Patsi Sears of urology was subsequently took patient to the operating room where he had a cystourethroscopy, right retrograde pyelogram with interpretation, right ureteroscopy, basket extraction of right lower ureteral calculus and insertion of 6 French x26 cm right double-J stent. Patient was monitored. In the step down unit patient was noted to be hypotensive and placed on  IV fluids. Random cortisol level was obtained which was within normal limits. Initial urinalysis which was done on admission was negative however repeat urinalysis which was done was consistent with a UTI. Patient's also  noted to have a leukocytosis. Patient was monitored patient improved clinically his blood pressure regimen was held and he was hydrated aggressively with IV fluids. Patient's leukocytosis improved such that by day of discharge patient's white count was down to 9.0 from 26.9 on admission. Urine cultures which were obtained were negative the patient had been on antibiotics prior to repeat the urinalysis. Patient improved clinically IV fluids were subsequently saline lock with stabilization of his blood pressure. Patient remained in stable condition he was followed by urology during the hospitalization be discharged home in stable and improved condition. Patient is to followup with PCP as outpatient. Patient is to resume his blood pressure regimen tomorrow. Patient is also to followup with Dr. Patsi Sears of urology in one week for stent removal and hospital followup. Patient was discharged in stable and improved condition. Patient will be discharged on 11 days of oral Ceftin.   #2 hypotension  During the hospitalization patient was noted to be hypotensive. It was felt patient hypotension was secondary to sepsis secondary to probable complicated urinary tract infection. Blood cultures which were obtained with no growth to date. Random cortisol level was within normal limits. Lactic acid level was within normal limits a pro calcitonin level was 0.55. Patient was hydrated with IV fluids. Repeat urinalysis which was done was consistent with a UTI however urine cultures had no growth as patient had been on antibiotics one to 2 days prior to repeat urinalysis. Patient was maintained on IV Rocephin he improved clinically IV fluids and subsequently saline lock and his systolic blood pressure remained greater than 100. Patient be discharged in stable and improved condition to followup with PCP and urology as outpatient.  #3 hyponatremia  Likely secondary to hypovolemic hyponatremia. Urine sodium was < 15. Patient was  hydrated with IVF with resolution of hyponatremia. #4 acute renal failure  During the hospitalization patient was noted to be in acute renal failure with his creatinine going up as high as 1.49. It was felt this was likely secondary to a prerenal azotemia in the setting of ACE inhibitor and hydronephrosis with obstructing calculus. Patient's ACE inhibitor was discontinued. A urine sodium was obtained which was less than 15. Patient was hydrated with IV fluids. Patient also underwent double-J stent placement and stone extraction per his urologist. Patient's renal function improved on a daily basis and had resolved by day of discharge. Patient will need to followup with his PCP one week post discharge where repeat basic metabolic profile need to be obtained to followup on his electrolytes and renal function. Patient will be discharged in stable and improved condition. #5 complicated urinary tract infection On admission patient was noted to be septic and CT scan did show hydronephrosis with obstructing calculus. Initial urinalysis which was done on admission was negative however repeat urinalysis which was done was consistent with a UTI. Patient on admission was started on IV Rocephin and he was seen per urology. Patient was subsequently taken to the operating room where he had stone extraction and placement of right double-J stent. Patient was maintained on IV Rocephin urine cultures which were done came back negative as repeat cultures which were done were done after patient had been on antibiotics. Patient improved clinically. Due to patient's obstructing calculus and sepsis patient's UTI is being treated  as a complicated urinary tract infection. Patient will be discharged home on oral Ceftin for 11 more days to complete a two-week course of antibiotic therapy. Patient will followup with urology one week post discharge. #6 leukocytosis  On admission patient was noted to have a leukocytosis with a white count  of 26.9. It was felt likely secondary to obstructive nephrolithiasis or urinary tract infection. Patient was placed on IV Rocephin. Blood cultures which were obtained with no growth to date. Chest x-ray was also obtained which was negative. Patient's white count improved and trended down such that by day of discharge his leukocytosis had resolved and his white count was 9.0. Patient was maintained on IV Rocephin during the hospitalization has been transitioned to oral Ceftin for 11 more days to complete a two-week course of antibiotic therapy for complicated urinary tract infection. Patient will followup with urology at his PCP as outpatient. #7 hydronephrosis with obstructing calculus  On admission patient had presented in sepsis CT scan of the abdomen did show hydronephrosis with obstructing calculus. Blood cultures were obtained. UA which was done on admission was negative however repeat urinalysis which was done was consistent with a UTI. Patient was empirically started on IV Rocephin and a urology consultation was obtained. Patient was seen in consultation by Dr. Patsi Sears who took the patient to the operating room where patient underwent cystourethroscopy, right retrograde pyelogram with interpretation, right ureteroscopy, basket extraction of right lower ureteral calculus, insertion of 6 French x26 cm right double-J stent. Patient had good urinary output. Patient improved clinically. Patient was maintained on IV Rocephin throughout the hospitalization and has been transitioned to oral Ceftin to complete a two-week course of antibiotic therapy for complicated UTI. Patient will followup with Dr. Patsi Sears as outpatient for stent removal and further management.  #8 hypertension  During the hospitalization due to patient's sepsis and hypotension patient's blood pressure regimen was held. Patient was noted to also be in acute renal failure and a such his ACE inhibitor was held. Patient's hypotension improved  and resolved with IV fluids and empiric IV Rocephin. Patient's blood pressure remained stable. Patient will be discharged with resumption of his home regimen of antihypertensive medications and will need to followup with PCP one week post discharge. Patient be discharged in stable and improved condition.   The rest of patient's chronic medical issues remained stable throughout the hospitalization and patient was discharged in stable and improved condition.     Procedures: Acute abdominal series 08/24/2012  Cystoscopy ureteroscopy, right retrograde pyelogram with interpretation, right ureteroscopy, basket extraction of right lower ureteral calculus, insertion of 6 French x26 cm right double J stent 08/24/2012 per Dr. Patsi Sears   Consultations:  Urology: Dr. Patsi Sears 08/24/2012  Discharge Exam: Filed Vitals:   08/26/12 2148 08/27/12 0224 08/27/12 0534 08/27/12 1010  BP: 135/76 137/78 145/89 147/82  Pulse: 85 74 73 84  Temp: 98.7 F (37.1 C) 98.1 F (36.7 C) 97.8 F (36.6 C) 98.6 F (37 C)  TempSrc: Oral Oral Oral Oral  Resp: 18 18 18 18   Height:      Weight:      SpO2: 97% 94% 98% 97%    General: NAD Cardiovascular: RRR Respiratory: soft/NT/ND/+BS  Discharge Instructions  Discharge Orders    Future Orders Please Complete By Expires   Diet general      Increase activity slowly      Discharge instructions      Comments:   Follow up with Dt Patsi Sears as scheduled Follow up  with [name], MD in 1 week.       Medication List     As of 08/27/2012  2:36 PM    TAKE these medications         cefUROXime 500 MG tablet   Commonly known as: CEFTIN   Take 1 tablet (500 mg total) by mouth 2 (two) times daily with a meal. Take for 11 days.      lisinopril 10 MG tablet   Commonly known as: PRINIVIL,ZESTRIL   Take 10 mg by mouth daily.      oxybutynin 5 MG tablet   Commonly known as: DITROPAN   Take 1 tablet (5 mg total) by mouth every 8 (eight) hours as  needed. Take as needed for bladder spasms.      oxyCODONE-acetaminophen 5-325 MG per tablet   Commonly known as: PERCOCET/ROXICET   Take 1-2 tablets by mouth every 4 (four) hours as needed for pain.      promethazine 25 MG tablet   Commonly known as: PHENERGAN   Take 1 tablet (25 mg total) by mouth every 6 (six) hours as needed for nausea.      Tamsulosin HCl 0.4 MG Caps   Commonly known as: FLOMAX   Take 1 capsule (0.4 mg total) by mouth daily.           Follow-up Information    Follow up with Jethro Bolus I, MD. (call for appointment for stent removal)    Contact information:   [address] [place], [address] [facility] [place] [address] [phone]       Follow up with [name], MD. Schedule an appointment as soon as possible for a visit in 1 week.   Contact information:   [facility] [address] [place] [address] [phone]           The results of significant diagnostics from this hospitalization (including imaging, microbiology, ancillary and laboratory) are listed below for reference.    Significant Diagnostic Studies: Ct Abdomen Pelvis Wo Contrast  08/22/2012  *RADIOLOGY REPORT*  Clinical Data: Right testicular pain, vomiting, history of kidney stones  CT ABDOMEN AND PELVIS WITHOUT CONTRAST  Technique:  Multidetector CT imaging of the abdomen and pelvis was performed following the standard protocol without intravenous contrast.  Comparison: Testicular ultrasound performed today, 08/22/2012  Findings:  Lower Chest:  There is a 5 mm subpleural nodule in the right middle lobe on image 5 of series 6. The visualized lung bases are otherwise clear.  Visualized cardiac structures unremarkable.  Abdomen: Evaluation of the abdominal organs is limited in the absence of intravenous contrast material.  Given these limitations, the CT appearance of the stomach, duodenum, spleen,  pancreas, adrenal glands, and liver are unremarkable. Gallbladder is unremarkable. No intra or extrahepatic biliary ductal dilatation.  Moderate right hydronephrosis with interstitial stranding in the perinephric space and around the renal pelvis.  There is mild ureterectasis leading up to with a 4 mm stone in the distal ureter just proximal to the UVJ.  No additional radiopaque renal calculi identified in the right kidney.  There are several small stones in the left kidney including a 3 mm stone in the upper pole collecting system and a tiny 1 - 2 mm stone in the interpolar collecting system.  There is mild - moderate left pelvicaliectasis without associated ureteral nephrosis. Some of the left pelvicaliectasis may in fact represent renal sinus cysts.  The ureter is normal  caliber and no evidence of ureteral stone.  Normal-caliber large and small bowel throughout the abdomen without evidence of obstruction.  No significant colonic diverticular disease.  No free fluid in the abdomen, and no suspicious adenopathy.  Pelvis: 4 mm obstructing distal right ureteral stone just proximal to the UVJ.  The bladder is unremarkable in appearance as are the seminal vesicles.  Small central calcification in the prostate.  No free fluid or suspicious adenopathy in the pelvis.  Bones: No acute fracture or aggressive appearing lytic or blastic osseous lesion. Multilevel degenerative disc disease, most significant at L1-L2 where there is focal loss of disc space height, and reversal of the normal lumbar lordosis.  Vascular: Trace atherosclerotic vascular disease.  Evaluation significantly limited in the absence of intravenous contrast.  IMPRESSION:  1.  Obstructing 4 mm distal right ureteral stone with associated moderate hydronephrosis.  2.  Nonobstructing renal calculi in the left kidney measure up to 3 mm  3.  Query mild - moderate left hydronephrosis which may be chronic given the absence of associated ureteral dilatation.   Evaluation complicated by the presence of several renal sinus cysts.  In the absence of intravenous contrast, it is difficult to separate the renal sinus cysts from the collecting system.  4. A 5 mm sub pleural pulmonary nodule in the right middle lobe is highly likely to represent a subpleural lymph node.  However, additional follow-up is recommended to document stability.  If the patient is at high risk for bronchogenic carcinoma, follow-up chest CT at 6-12 months is recommended.  If the patient is at low risk for bronchogenic carcinoma, follow-up chest CT at 12 months is recommended.  This recommendation follows the consensus statement: Guidelines for Management of Small Pulmonary Nodules Detected on CT Scans: A Statement from the Fleischner Society as published in Radiology 2005; 237:395-400.   5.  Multilevel degenerative disc disease in the lumbar spine most significant at L1-L2 and L2-L3.  6.  Trace atherosclerotic disease.   Original Report Authenticated By: Vilma Prader    US Scrotum  08/22/2012  *RADIOLOGY REPORT*  Clinical Data:  Right testicular pain.  No injury.  SCROTAL ULTRASOUND DOPPLER ULTRASOUND OF THE TESTICLES  Technique: Complete ultrasound examination of the testicles, epididymis, and other scrotal structures was performed.  Color and spectral Doppler ultrasound were also utilized to evaluate blood flow to the testicles.  Comparison:  None.  Findings:  Right testis:  The right testis measures 5.2 x 2.8 x 3.3 cm. Normal homogeneous parenchymal echotexture.  Flow is demonstrated within the testis on color flow Doppler imaging.  No focal mass lesions.  Left testis:  The left testis measures 4.8 x 2.6 x 3.4 cm.  Normal homogeneous parenchymal echotexture.  Flow is demonstrated within the left testis on color flow Doppler imaging.  No focal mass lesions.  Right epididymis:  3 mm diameter right epididymal cyst or spermatic seal.  Low flow.  Left epididymis:  Normal in size and appearance.  Hydrocele:   Bilateral scrotal hydroceles are demonstrated.  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  Pulsed Doppler interrogation of both testes demonstrates low resistance flow bilaterally.  IMPRESSION: No evidence of testicular mass or torsion.  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   Original Report Authenticated By: Marlon Pel, M.D.    [place] Art/ven Flow Abd Pelv Doppler  08/22/2012  *RADIOLOGY REPORT*  Clinical Data:  Right testicular pain.  No injury.  SCROTAL ULTRASOUND DOPPLER ULTRASOUND OF THE TESTICLES  Technique:  Complete ultrasound examination of the testicles, epididymis, and other scrotal structures was performed.  Color and spectral Doppler ultrasound were also utilized to evaluate blood flow to the testicles.  Comparison:  None.  Findings:  Right testis:  The right testis measures 5.2 x 2.8 x 3.3 cm. Normal homogeneous parenchymal echotexture.  Flow is demonstrated within the testis on color flow Doppler imaging.  No focal mass lesions.  Left testis:  The left testis measures 4.8 x 2.6 x 3.4 cm.  Normal homogeneous parenchymal echotexture.  Flow is demonstrated within the left testis on color flow Doppler imaging.  No focal mass lesions.  Right epididymis:  3 mm diameter right epididymal cyst or spermatic seal.  Low flow.  Left epididymis:  Normal in size and appearance.  Hydrocele:  Bilateral scrotal hydroceles are demonstrated.  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  Pulsed Doppler interrogation of both testes demonstrates low resistance flow bilaterally.  IMPRESSION: No evidence of testicular mass or torsion.  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   Original Report Authenticated By: Marlon Pel, M.D.    Dg Chest Port 1 View  08/25/2012  *RADIOLOGY REPORT*  Clinical Data: Systemic inflammatory response syndrome. Hypotension.  Productive cough.  Difficulty breathing.  PORTABLE CHEST - 1 VIEW  08/25/2012 0912 hours:  Comparison: One-view chest x-ray yesterday.  Findings: Suboptimal inspiration accounts for crowded bronchovascular markings, especially in the lung bases, and accentuates the cardiac silhouette.  Taking this into account, cardiomediastinal silhouette unremarkable for technique. Consolidation at the right lung base, unchanged.  New linear atelectasis at the left lung base.  Upper lungs remain clear.  IMPRESSION: Stable atelectasis versus pneumonia at the right lung base.  New linear atelectasis at the left lung base.   Original Report Authenticated By: Arnell Sieving, M.D.    Dg Abd Acute W/chest  08/24/2012  *RADIOLOGY REPORT*  Clinical Data: Right-sided abdominal pain for 3 days.  History of kidney stones.  ACUTE ABDOMEN SERIES (ABDOMEN 2 VIEW & CHEST 1 VIEW)  Comparison: Abdomen and pelvis CT, 08/22/2012  Findings: A round stone projects in the right lower pelvis which is consistent in size and position with the distal ureteral stone seen on the recent prior CT.  Other round calcifications in the pelvis are likely phleboliths.  No definitive renal stone is evident.  The soft tissues are otherwise unremarkable.  There is mild increase stool most notable in the right colon. There is no bowel dilation to suggest obstruction.  There are a few air-fluid levels on the erect view.  Findings may reflect a mild adynamic ileus.  No free air.  The included frontal radiograph of the chest shows a right basilar lung opacity.  This is new from the prior CT.  It is likely atelectasis.  Infiltrate should be considered in the proper clinical setting.  The lungs otherwise clear.  The bony thorax is intact.  IMPRESSION: No evidence of a small bowel obstruction.  Mild increase stool right colon and nonspecific air-fluid levels on the erect view suggests a mild adynamic ileus.  Stone seen in the right distal ureter on the recent CT is suggested in the right lower pelvis on the current study suggesting  that the stone has not passed in the interval.  It is possible that this is a phlebolith and not the distal ureteral stone seen on the recent prior  CT.  The right lung base opacity, likely atelectasis although infiltrate is possible.   Original Report Authenticated  By: Domenic Moras, M.D.     Microbiology: Recent Results (from the past 240 hour(s))  MRSA PCR SCREENING     Status: Normal   Collection Time   08/24/12  3:14 PM      Component Value Range Status Comment   MRSA by PCR NEGATIVE  NEGATIVE Final   CULTURE, BLOOD (ROUTINE X 2)     Status: Normal (Preliminary result)   Collection Time   08/25/12  9:15 AM      Component Value Range Status Comment   Specimen Description BLOOD RIGHT ARM   Final    Special Requests BOTTLES DRAWN AEROBIC AND ANAEROBIC 5CC   Final    Culture  Setup Time 08/25/2012 14:38   Final    Culture     Final    Value:        BLOOD CULTURE RECEIVED NO GROWTH TO DATE CULTURE WILL BE HELD FOR 5 DAYS BEFORE ISSUING A FINAL NEGATIVE REPORT   Report Status PENDING   Incomplete   CULTURE, BLOOD (ROUTINE X 2)     Status: Normal (Preliminary result)   Collection Time   08/25/12  9:25 AM      Component Value Range Status Comment   Specimen Description BLOOD RIGHT HAND   Final    Special Requests BOTTLES DRAWN AEROBIC AND ANAEROBIC 6CC   Final    Culture  Setup Time 08/25/2012 14:38   Final    Culture     Final    Value:        BLOOD CULTURE RECEIVED NO GROWTH TO DATE CULTURE WILL BE HELD FOR 5 DAYS BEFORE ISSUING A FINAL NEGATIVE REPORT   Report Status PENDING   Incomplete   URINE CULTURE     Status: Normal   Collection Time   08/25/12 10:42 AM      Component Value Range Status Comment   Specimen Description URINE, CLEAN CATCH   Final    Special Requests NONE   Final    Culture  Setup Time 08/25/2012 14:59   Final    Colony Count NO GROWTH   Final    Culture NO GROWTH   Final    Report Status 08/26/2012 FINAL   Final      Labs: Basic Metabolic Panel:  Lab  08/27/12 0430 08/26/12 0800 08/25/12 0306 08/24/12 1750 08/24/12 1022 08/22/12 0245  NA 137 135 132* -- 123* 135  K 3.9 4.2 4.2 -- 4.2 4.0  CL 107 104 99 -- 88* 100  CO2 23 24 26  -- 25 25  GLUCOSE 107* 96 109* -- 140* 170*  BUN 19 21 26* -- 21 17  CREATININE 0.93 0.92 1.49* -- 1.38* 1.07  CALCIUM 7.9* 7.9* 8.0* -- 9.1 9.2  MG -- -- -- 2.0 -- --  PHOS -- -- -- 3.3 -- --   Liver Function Tests:  Lab 08/22/12 0245  AST 18  ALT 16  ALKPHOS 48  BILITOT 0.6  PROT 7.0  ALBUMIN 4.4    Lab 08/22/12 0245  LIPASE 63*  AMYLASE --   No results found for this basename: AMMONIA:5 in the last 168 hours CBC:  Lab 08/27/12 0430 08/26/12 0800 08/25/12 0306 08/24/12 1022 08/22/12 0245  WBC 9.0 10.2 16.0* 26.9* 11.3*  NEUTROABS -- 8.1* -- 22.7* --  HGB 10.4* 11.3* 11.5* 14.1 14.2  HCT 30.2* 32.8* 32.8* 38.7* 39.3  MCV 87.5 87.7 87.2 84.1 86.0  PLT 244 214 199 262 233   Cardiac Enzymes: No  results found for this basename: CKTOTAL:5,CKMB:5,CKMBINDEX:5,TROPONINI:5 in the last 168 hours BNP: BNP (last 3 results) No results found for this basename: PROBNP:3 in the last 8760 hours CBG: No results found for this basename: GLUCAP:5 in the last 168 hours  Time coordinating discharge: 60 minutes  Signed:  THOMPSON,DANIEL  [facility] 08/27/2012, 2:36 PM

## 2012-08-31 LAB — CULTURE, BLOOD (ROUTINE X 2)
Culture: NO GROWTH
Culture: NO GROWTH

## 2013-03-14 ENCOUNTER — Encounter (HOSPITAL_COMMUNITY): Payer: Self-pay | Admitting: Emergency Medicine

## 2013-03-14 ENCOUNTER — Emergency Department (HOSPITAL_COMMUNITY): Payer: BC Managed Care – PPO

## 2013-03-14 ENCOUNTER — Emergency Department (HOSPITAL_COMMUNITY)
Admission: EM | Admit: 2013-03-14 | Discharge: 2013-03-15 | Disposition: A | Discharge status: Home / Self Care | Payer: BC Managed Care – PPO | Attending: Emergency Medicine | Admitting: Emergency Medicine

## 2013-03-14 DIAGNOSIS — Z87442 Personal history of urinary calculi: Secondary | ICD-10-CM | POA: Insufficient documentation

## 2013-03-14 DIAGNOSIS — Z9089 Acquired absence of other organs: Secondary | ICD-10-CM | POA: Insufficient documentation

## 2013-03-14 DIAGNOSIS — N201 Calculus of ureter: Secondary | ICD-10-CM

## 2013-03-14 DIAGNOSIS — H5440 Blindness, one eye, unspecified eye: Secondary | ICD-10-CM | POA: Insufficient documentation

## 2013-03-14 DIAGNOSIS — I10 Essential (primary) hypertension: Secondary | ICD-10-CM | POA: Insufficient documentation

## 2013-03-14 DIAGNOSIS — Z79899 Other long term (current) drug therapy: Secondary | ICD-10-CM | POA: Insufficient documentation

## 2013-03-14 DIAGNOSIS — R110 Nausea: Secondary | ICD-10-CM | POA: Insufficient documentation

## 2013-03-14 DIAGNOSIS — Z791 Long term (current) use of non-steroidal anti-inflammatories (NSAID): Secondary | ICD-10-CM | POA: Insufficient documentation

## 2013-03-14 LAB — URINALYSIS, MICROSCOPIC ONLY
Bilirubin Urine: NEGATIVE
Glucose, UA: NEGATIVE mg/dL
Ketones, ur: NEGATIVE mg/dL
Leukocytes, UA: NEGATIVE
Nitrite: NEGATIVE
Protein, ur: NEGATIVE mg/dL
Specific Gravity, Urine: 1.025 (ref 1.005–1.030)
Urobilinogen, UA: 0.2 mg/dL (ref 0.0–1.0)
pH: 7 (ref 5.0–8.0)

## 2013-03-14 LAB — COMPREHENSIVE METABOLIC PANEL
ALT: 24 U/L (ref 0–53)
AST: 21 U/L (ref 0–37)
Albumin: 4.3 g/dL (ref 3.5–5.2)
Alkaline Phosphatase: 46 U/L (ref 39–117)
BUN: 17 mg/dL (ref 6–23)
CO2: 27 mEq/L (ref 19–32)
Calcium: 9.1 mg/dL (ref 8.4–10.5)
Chloride: 101 mEq/L (ref 96–112)
Creatinine, Ser: 1.48 mg/dL — ABNORMAL HIGH (ref 0.50–1.35)
GFR calc Af Amer: 61 mL/min — ABNORMAL LOW (ref >90)
GFR calc non Af Amer: 52 mL/min — ABNORMAL LOW (ref >90)
Glucose, Bld: 131 mg/dL — ABNORMAL HIGH (ref 70–99)
Potassium: 4 mEq/L (ref 3.5–5.1)
Sodium: 137 mEq/L (ref 135–145)
Total Bilirubin: 0.5 mg/dL (ref 0.3–1.2)
Total Protein: 6.9 g/dL (ref 6.0–8.3)

## 2013-03-14 LAB — CBC WITH DIFFERENTIAL/PLATELET
Basophils Absolute: 0.1 10*3/uL (ref 0.0–0.1)
Basophils Relative: 0 % (ref 0–1)
Eosinophils Absolute: 0.2 10*3/uL (ref 0.0–0.7)
Eosinophils Relative: 1 % (ref 0–5)
HCT: 38 % — ABNORMAL LOW (ref 39.0–52.0)
Hemoglobin: 13.6 g/dL (ref 13.0–17.0)
Lymphocytes Relative: 11 % — ABNORMAL LOW (ref 12–46)
Lymphs Abs: 1.4 10*3/uL (ref 0.7–4.0)
MCH: 30.8 pg (ref 26.0–34.0)
MCHC: 35.8 g/dL (ref 30.0–36.0)
MCV: 86 fL (ref 78.0–100.0)
Monocytes Absolute: 0.7 10*3/uL (ref 0.1–1.0)
Monocytes Relative: 6 % (ref 3–12)
Neutro Abs: 10 10*3/uL — ABNORMAL HIGH (ref 1.7–7.7)
Neutrophils Relative %: 81 % — ABNORMAL HIGH (ref 43–77)
Platelets: 217 10*3/uL (ref 150–400)
RBC: 4.42 MIL/uL (ref 4.22–5.81)
RDW: 12.1 % (ref 11.5–15.5)
WBC: 12.4 10*3/uL — ABNORMAL HIGH (ref 4.0–10.5)

## 2013-03-14 LAB — LIPASE, BLOOD: Lipase: 46 U/L (ref 11–59)

## 2013-03-14 MED ORDER — HYDROMORPHONE HCL PF 1 MG/ML IJ SOLN
1.0000 mg | Freq: Once | INTRAMUSCULAR | Status: AC
  Administered 2013-03-14: 1 mg via INTRAVENOUS
  Filled 2013-03-14: qty 1

## 2013-03-14 NOTE — ED Provider Notes (Signed)
History     CSN: [id number]  Arrival date & time 03/14/13  2101   First MD Initiated Contact with Patient 03/14/13 2211      Chief Complaint  Patient presents with  . Flank Pain    (Consider location/radiation/quality/duration/timing/severity/associated sxs/prior treatment) HPI Comments: Hx of renal stones on the right side before - leading to septic stone. This time the pain is left sided. Started yday, was intermittent, but starting today at 7 pm, it's constant, sharp pain, with some nausea. No UTI like sx.   Patient is a 52 y.o. male presenting with flank pain. The history is provided by the patient and medical records.  Flank Pain This is a new problem. The current episode started 1 to 2 hours ago. The problem occurs constantly. The problem has not changed since onset.Pertinent negatives include no chest pain, no abdominal pain and no shortness of breath. Nothing aggravates the symptoms. Nothing relieves the symptoms.    Past Medical History  Diagnosis Date  . Kidney stone   . Hypertension   . Blind right eye     Past Surgical History  Procedure Laterality Date  . Appendectomy    . Right retina detachment    . Cystoscopy w/ ureteral stent placement  08/24/2012    Procedure: CYSTOSCOPY WITH RETROGRADE PYELOGRAM/URETERAL STENT PLACEMENT;  Surgeon: Kathi Ludwig, MD;  Location: [facility];  Service: Urology;  Laterality: Right;  . Cystoscopy/retrograde/ureteroscopy  08/24/2012    Procedure: CYSTOSCOPY/RETROGRADE/URETEROSCOPY;  Surgeon: Kathi Ludwig, MD;  Location: [facility];  Service: Urology;  Laterality: Right;  Stone Basketing    History reviewed. No pertinent family history.  History  Substance Use Topics  . Smoking status: Never Smoker   . Smokeless tobacco: Never Used  . Alcohol Use: No      Review of Systems  Constitutional: Negative for activity change and appetite change.  Respiratory: Negative for cough and shortness of breath.    Cardiovascular: Negative for chest pain.  Gastrointestinal: Positive for nausea. Negative for abdominal pain.  Genitourinary: Positive for flank pain. Negative for dysuria.    Allergies  Review of patient's allergies indicates no known allergies.  Home Medications   Current Outpatient Rx  Name  Route  Sig  Dispense  Refill  . lisinopril (PRINIVIL,ZESTRIL) 10 MG tablet   Oral   Take 10 mg by mouth daily.         . meloxicam (MOBIC) 7.5 MG tablet   Oral   Take 7.5 mg by mouth daily.         . metoprolol (LOPRESSOR) 50 MG tablet   Oral   Take 50 mg by mouth 2 (two) times daily.           BP 156/94  Pulse 76  Temp(Src) 98.8 F (37.1 C) (Oral)  Resp 18  Wt 217 lb (98.431 kg)  BMI 28.64 kg/m2  SpO2 100%  Physical Exam  Constitutional: He is oriented to person, place, and time. He appears well-developed.  HENT:  Head: Normocephalic and atraumatic.  Eyes: Conjunctivae and EOM are normal. Pupils are equal, round, and reactive to light.  Neck: Normal range of motion. Neck supple.  Cardiovascular: Normal rate and regular rhythm.   Pulmonary/Chest: Effort normal and breath sounds normal.  Abdominal: Soft. Bowel sounds are normal. He exhibits no distension. There is no tenderness. There is no rebound and no guarding.  Neurological: He is alert and oriented to person, place, and time.  Skin: Skin is warm.  ED Course  Procedures (including critical care time)  Labs Reviewed  CBC WITH DIFFERENTIAL - Abnormal; Notable for the following:    WBC 12.4 (*)    HCT 38.0 (*)    Neutrophils Relative 81 (*)    Neutro Abs 10.0 (*)    Lymphocytes Relative 11 (*)    All other components within normal limits  COMPREHENSIVE METABOLIC PANEL - Abnormal; Notable for the following:    Glucose, Bld 131 (*)    Creatinine, Ser 1.48 (*)    GFR calc non Af Amer 52 (*)    GFR calc Af Amer 61 (*)    All other components within normal limits  URINALYSIS, MICROSCOPIC ONLY - Abnormal;  Notable for the following:    Hgb urine dipstick TRACE (*)    All other components within normal limits  LIPASE, BLOOD   Ct Abdomen Pelvis Wo Contrast  03/14/2013  *RADIOLOGY REPORT*  Clinical Data: Left flank pain today.  Previous history of stones.  CT ABDOMEN AND PELVIS WITHOUT CONTRAST  Technique:  Multidetector CT imaging of the abdomen and pelvis was performed following the standard protocol without intravenous contrast.  Comparison: 08/22/2012  Findings: 5 mm subpleural nodule in the anterior right middle lung is stable since previous study.  Mild dependent changes in the lung bases.  There is a 4 mm stone in the distal left ureter just above the ureterovesicle junction.  There is periureteral and pararenal stranding with left pyelocaliectasis and ureterectasis.  A stone previously demonstrated in the left upper pole is no longer visualized, likely representing the current ureteral stone.  No right-sided pyelocaliectasis or ureterectasis.  No right renal, ureteral, or bladder stones are visualized.  Bladder wall is not thickened.  The unenhanced appearance of the liver, spleen, gallbladder, pancreas, adrenal glands, abdominal aorta, inferior vena cava, retroperitoneal lymph nodes, stomach, small bowel, and colon is unremarkable.  No free air or free fluid in the abdomen.  Abdominal wall musculature appears intact.  Pelvis:  Prostate gland is not enlarged.  Bladder wall is not thickened.  No free or loculated pelvic fluid collections.  The appendix is not identified.  No evidence of diverticulitis.  No significant pelvic lymphadenopathy.  Degenerative changes in the lumbar spine.  IMPRESSION: 4 mm stone in the distal left ureter with moderate proximal obstruction.   Original Report Authenticated By: Burman Nieves, M.D.      No diagnosis found.    MDM  Pt comes in with renal stones comes in with cc of left sided stone per CT. UA is clean. No emesis. Called Dr. Margarita Grizzle, who is recommending  close f/u - especially given previous hx of severe complications.   Derwood Kaplan, MD 03/15/13 0030

## 2013-03-14 NOTE — ED Notes (Signed)
Patient states that he has had pain to his lower back radiating down into his groin area. N/ V

## 2013-03-15 MED ORDER — HYDROCODONE-ACETAMINOPHEN 5-325 MG PO TABS: 1.0000 | ORAL_TABLET | Freq: Four times a day (QID) | ORAL | Status: DC | PRN

## 2013-03-15 MED ORDER — ONDANSETRON 8 MG PO TBDP: 8.0000 mg | ORAL_TABLET | Freq: Three times a day (TID) | ORAL | Status: DC | PRN

## 2013-03-15 MED ORDER — TAMSULOSIN HCL 0.4 MG PO CAPS: 0.4000 mg | ORAL_CAPSULE | Freq: Every day | ORAL | Status: DC

## 2013-03-15 MED ORDER — HYDROMORPHONE HCL PF 1 MG/ML IJ SOLN
1.0000 mg | Freq: Once | INTRAMUSCULAR | Status: AC
  Administered 2013-03-15: 1 mg via INTRAVENOUS
  Filled 2013-03-15: qty 1

## 2013-04-05 ENCOUNTER — Emergency Department (HOSPITAL_COMMUNITY)
Admission: EM | Admit: 2013-04-05 | Discharge: 2013-04-06 | Disposition: A | Discharge status: Home / Self Care | Payer: BC Managed Care – PPO | Attending: Emergency Medicine | Admitting: Emergency Medicine

## 2013-04-05 ENCOUNTER — Other Ambulatory Visit: Payer: Self-pay | Admitting: Urology

## 2013-04-05 ENCOUNTER — Encounter (HOSPITAL_COMMUNITY): Payer: Self-pay | Admitting: *Deleted

## 2013-04-05 ENCOUNTER — Emergency Department (HOSPITAL_COMMUNITY): Payer: BC Managed Care – PPO

## 2013-04-05 DIAGNOSIS — I10 Essential (primary) hypertension: Secondary | ICD-10-CM | POA: Insufficient documentation

## 2013-04-05 DIAGNOSIS — R112 Nausea with vomiting, unspecified: Secondary | ICD-10-CM | POA: Insufficient documentation

## 2013-04-05 DIAGNOSIS — Z79899 Other long term (current) drug therapy: Secondary | ICD-10-CM | POA: Insufficient documentation

## 2013-04-05 DIAGNOSIS — H5440 Blindness, one eye, unspecified eye: Secondary | ICD-10-CM | POA: Insufficient documentation

## 2013-04-05 DIAGNOSIS — N200 Calculus of kidney: Secondary | ICD-10-CM

## 2013-04-05 DIAGNOSIS — R630 Anorexia: Secondary | ICD-10-CM | POA: Insufficient documentation

## 2013-04-05 LAB — URINALYSIS, ROUTINE W REFLEX MICROSCOPIC
Bilirubin Urine: NEGATIVE
Glucose, UA: NEGATIVE mg/dL
Ketones, ur: NEGATIVE mg/dL
Leukocytes, UA: NEGATIVE
Nitrite: NEGATIVE
Protein, ur: NEGATIVE mg/dL
Specific Gravity, Urine: 1.028 (ref 1.005–1.030)
Urobilinogen, UA: 1 mg/dL (ref 0.0–1.0)
pH: 7 (ref 5.0–8.0)

## 2013-04-05 LAB — URINE MICROSCOPIC-ADD ON

## 2013-04-05 MED ORDER — SODIUM CHLORIDE 0.9 % IV BOLUS (SEPSIS)
1000.0000 mL | Freq: Once | INTRAVENOUS | Status: AC
  Administered 2013-04-05: 1000 mL via INTRAVENOUS

## 2013-04-05 MED ORDER — HYDROMORPHONE HCL PF 1 MG/ML IJ SOLN
1.0000 mg | Freq: Once | INTRAMUSCULAR | Status: AC
  Administered 2013-04-05: 1 mg via INTRAVENOUS
  Filled 2013-04-05: qty 1

## 2013-04-05 MED ORDER — ONDANSETRON HCL 4 MG/2ML IJ SOLN
4.0000 mg | Freq: Once | INTRAMUSCULAR | Status: AC
  Administered 2013-04-05: 4 mg via INTRAVENOUS
  Filled 2013-04-05: qty 2

## 2013-04-05 NOTE — ED Notes (Signed)
Pt has known kidney stone; c/o lower left abd pain since lunchtime; having nausea/vomiting; pale/diaphoretic; having urine urgency and hesitation no hematuria noted

## 2013-04-06 LAB — POCT I-STAT, CHEM 8
BUN: 21 mg/dL (ref 6–23)
Calcium, Ion: 1.1 mmol/L — ABNORMAL LOW (ref 1.12–1.23)
Chloride: 104 mEq/L (ref 96–112)
Creatinine, Ser: 1.4 mg/dL — ABNORMAL HIGH (ref 0.50–1.35)
Glucose, Bld: 135 mg/dL — ABNORMAL HIGH (ref 70–99)
HCT: 38 % — ABNORMAL LOW (ref 39.0–52.0)
Hemoglobin: 12.9 g/dL — ABNORMAL LOW (ref 13.0–17.0)
Potassium: 5.1 mEq/L (ref 3.5–5.1)
Sodium: 136 mEq/L (ref 135–145)
TCO2: 22 mmol/L (ref 0–100)

## 2013-04-06 MED ORDER — OXYCODONE-ACETAMINOPHEN 5-325 MG PO TABS
1.0000 | ORAL_TABLET | Freq: Once | ORAL | Status: AC
  Administered 2013-04-06: 1 via ORAL
  Filled 2013-04-06: qty 1

## 2013-04-06 MED ORDER — ONDANSETRON HCL 4 MG/2ML IJ SOLN
4.0000 mg | Freq: Once | INTRAMUSCULAR | Status: AC
  Administered 2013-04-06: 4 mg via INTRAVENOUS
  Filled 2013-04-06: qty 2

## 2013-04-06 MED ORDER — HYDROMORPHONE HCL PF 1 MG/ML IJ SOLN
0.5000 mg | Freq: Once | INTRAMUSCULAR | Status: AC
  Administered 2013-04-06: 0.5 mg via INTRAVENOUS
  Filled 2013-04-06: qty 1

## 2013-04-06 NOTE — ED Provider Notes (Addendum)
History     CSN: [id number]  Arrival date & time 04/05/13  2125   First MD Initiated Contact with Patient 04/05/13 2155      Chief Complaint  Patient presents with  . Abdominal Pain    (Consider location/radiation/quality/duration/timing/severity/associated sxs/prior treatment) Patient is a 52 y.o. male presenting with abdominal pain. The history is provided by the patient.  Abdominal Pain Pain location:  L flank Pain quality: sharp and stabbing   Pain radiates to:  Groin Pain severity:  Severe Onset quality:  Sudden Duration:  8 hours Timing:  Constant Progression:  Unchanged Chronicity:  Recurrent Context comment:  Known left kidney stone for the last 1 month and scheduled for removal on monday when today developed severe pain Relieved by:  Nothing Worsened by:  Nothing tried Ineffective treatments: opiates. Associated symptoms: anorexia, nausea and vomiting   Associated symptoms: no diarrhea, no dysuria and no fever   Associated symptoms comment:  Urinary urgency and feelings of retention Risk factors comment:  Known kidney stone   Past Medical History  Diagnosis Date  . Kidney stone   . Hypertension   . Blind right eye     Past Surgical History  Procedure Laterality Date  . Appendectomy    . Right retina detachment    . Cystoscopy w/ ureteral stent placement  08/24/2012    Procedure: CYSTOSCOPY WITH RETROGRADE PYELOGRAM/URETERAL STENT PLACEMENT;  Surgeon: Kathi Ludwig, MD;  Location: [facility];  Service: Urology;  Laterality: Right;  . Cystoscopy/retrograde/ureteroscopy  08/24/2012    Procedure: CYSTOSCOPY/RETROGRADE/URETEROSCOPY;  Surgeon: Kathi Ludwig, MD;  Location: [facility];  Service: Urology;  Laterality: Right;  Stone Basketing    No family history on file.  History  Substance Use Topics  . Smoking status: Never Smoker   . Smokeless tobacco: Never Used  . Alcohol Use: No      Review of Systems  Constitutional: Negative for fever.   Gastrointestinal: Positive for nausea, vomiting, abdominal pain and anorexia. Negative for diarrhea.  Genitourinary: Negative for dysuria.  All other systems reviewed and are negative.    Allergies  Review of patient's allergies indicates no known allergies.  Home Medications   Current Outpatient Rx  Name  Route  Sig  Dispense  Refill  . HYDROcodone-acetaminophen (NORCO/VICODIN) 5-325 MG per tablet   Oral   Take 1 tablet by mouth every 6 (six) hours as needed for pain.   15 tablet   0   . lisinopril (PRINIVIL,ZESTRIL) 10 MG tablet   Oral   Take 10 mg by mouth daily.         . metoprolol (LOPRESSOR) 50 MG tablet   Oral   Take 50 mg by mouth 2 (two) times daily.         . ondansetron (ZOFRAN ODT) 8 MG disintegrating tablet   Oral   Take 1 tablet (8 mg total) by mouth every 8 (eight) hours as needed for nausea.   20 tablet   0   . tamsulosin (FLOMAX) 0.4 MG CAPS   Oral   Take 1 capsule (0.4 mg total) by mouth daily.   10 capsule   0     BP 141/100  Pulse 94  Temp(Src) 98.7 F (37.1 C) (Oral)  Resp 18  Ht 6\' 1"  (1.854 m)  Wt 215 lb (97.523 kg)  BMI 28.37 kg/m2  SpO2 100%  Physical Exam  Nursing note and vitals reviewed. Constitutional: He is oriented to person, place, and time. He appears well-developed  and well-nourished. He appears distressed.  HENT:  Head: Normocephalic and atraumatic.  Mouth/Throat: Oropharynx is clear and moist.  Eyes: Conjunctivae and EOM are normal. Pupils are equal, round, and reactive to light.  Neck: Normal range of motion. Neck supple.  Cardiovascular: Normal rate, regular rhythm and intact distal pulses.   No murmur heard. Pulmonary/Chest: Effort normal and breath sounds normal. No respiratory distress. He has no wheezes. He has no rales.  Abdominal: Soft. He exhibits no distension. There is no tenderness. There is CVA tenderness. There is no rebound and no guarding.  Left CVA tenderness  Musculoskeletal: Normal range of  motion. He exhibits no edema and no tenderness.  Neurological: He is alert and oriented to person, place, and time.  Skin: Skin is warm and dry. No rash noted. No erythema.  Psychiatric: He has a normal mood and affect. His behavior is normal.    ED Course  Procedures (including critical care time)  Labs Reviewed  URINALYSIS, ROUTINE W REFLEX MICROSCOPIC - Abnormal; Notable for the following:    Hgb urine dipstick MODERATE (*)    All other components within normal limits  URINE MICROSCOPIC-ADD ON   Dg Abd 1 View  04/05/2013  *RADIOLOGY REPORT*  Clinical Data: Left lower abdominal pain, known left sided stone  ABDOMEN - 1 VIEW  Comparison: 04/04/2013  Findings: 4 mm calcific density projecting over the region of the bladder or left UVJ.  Multiple pelvic phlebolith. The bowel gas pattern is non-obstructive. Organ outlines are normal where seen. No acute or aggressive osseous abnormality identified. Hemidiaphragms excluded from the image.  IMPRESSION: 4 mm calcification in a similar location to the recent CT, either at the left UVJ or within the bladder.   Original Report Authenticated By: Jearld Lesch, M.D.      1. Kidney stone on left side       MDM   Patient with known 4 mm stem on the left he has had for approximately one month and had followed up with urology to have it removed on Monday when he developed severe pain starting at lunchtime today with nausea, vomiting and feelings of urinary retention. Bedside ultrasound shows no sign of urinary retention plain film shows a 4 mm stone in the left UVJ which appear to be in a similar location as prior. I-STAT with a normal creatinine and UA without signs of infection.  After pain medicine patient is feeling better we'll discharge home to follow up with Dr. Delman Cheadle, MD 04/06/13 1610  Gwyneth Sprout, MD 04/06/13 [id number]

## 2013-04-08 ENCOUNTER — Encounter (HOSPITAL_BASED_OUTPATIENT_CLINIC_OR_DEPARTMENT_OTHER): Payer: Self-pay | Admitting: *Deleted

## 2013-04-08 NOTE — Progress Notes (Signed)
To [facility] at 0930- Istat on arrival,Ekg in epic-Npo after Mn-instructed to take metoprolol with small amt water that am.

## 2013-04-11 ENCOUNTER — Encounter (HOSPITAL_BASED_OUTPATIENT_CLINIC_OR_DEPARTMENT_OTHER): Payer: Self-pay | Admitting: Anesthesiology

## 2013-04-11 ENCOUNTER — Encounter (HOSPITAL_BASED_OUTPATIENT_CLINIC_OR_DEPARTMENT_OTHER): Payer: Self-pay

## 2013-04-11 ENCOUNTER — Ambulatory Visit (HOSPITAL_BASED_OUTPATIENT_CLINIC_OR_DEPARTMENT_OTHER)
Admission: RE | Admit: 2013-04-11 | Discharge: 2013-04-11 | Disposition: A | Discharge status: Home / Self Care | Payer: BC Managed Care – PPO | Source: Ambulatory Visit | Attending: Urology | Admitting: Urology

## 2013-04-11 ENCOUNTER — Encounter (HOSPITAL_BASED_OUTPATIENT_CLINIC_OR_DEPARTMENT_OTHER)
Admission: RE | Disposition: A | Discharge status: Home / Self Care | Payer: Self-pay | Source: Ambulatory Visit | Attending: Urology

## 2013-04-11 ENCOUNTER — Ambulatory Visit (HOSPITAL_BASED_OUTPATIENT_CLINIC_OR_DEPARTMENT_OTHER): Payer: BC Managed Care – PPO | Admitting: Anesthesiology

## 2013-04-11 DIAGNOSIS — N201 Calculus of ureter: Secondary | ICD-10-CM | POA: Insufficient documentation

## 2013-04-11 DIAGNOSIS — N200 Calculus of kidney: Secondary | ICD-10-CM | POA: Insufficient documentation

## 2013-04-11 DIAGNOSIS — I10 Essential (primary) hypertension: Secondary | ICD-10-CM | POA: Insufficient documentation

## 2013-04-11 DIAGNOSIS — Z79899 Other long term (current) drug therapy: Secondary | ICD-10-CM | POA: Insufficient documentation

## 2013-04-11 HISTORY — PX: CYSTOSCOPY/RETROGRADE/URETEROSCOPY/STONE EXTRACTION WITH BASKET: SHX5317

## 2013-04-11 LAB — POCT I-STAT, CHEM 8
BUN: 16 mg/dL (ref 6–23)
Calcium, Ion: 1.22 mmol/L (ref 1.12–1.23)
Chloride: 103 mEq/L (ref 96–112)
Creatinine, Ser: 1 mg/dL (ref 0.50–1.35)
Glucose, Bld: 104 mg/dL — ABNORMAL HIGH (ref 70–99)
HCT: 41 % (ref 39.0–52.0)
Hemoglobin: 13.9 g/dL (ref 13.0–17.0)
Potassium: 4.4 mEq/L (ref 3.5–5.1)
Sodium: 140 mEq/L (ref 135–145)
TCO2: 27 mmol/L (ref 0–100)

## 2013-04-11 SURGERY — CYSTOSCOPY, WITH CALCULUS REMOVAL USING BASKET
Anesthesia: General | Site: Ureter | Laterality: Left | Wound class: Clean Contaminated

## 2013-04-11 MED ORDER — KETOROLAC TROMETHAMINE 30 MG/ML IJ SOLN
INTRAMUSCULAR | Status: DC | PRN
  Administered 2013-04-11: 30 mg via INTRAVENOUS

## 2013-04-11 MED ORDER — ONDANSETRON HCL 4 MG/2ML IJ SOLN
INTRAMUSCULAR | Status: DC | PRN
  Administered 2013-04-11: 4 mg via INTRAVENOUS

## 2013-04-11 MED ORDER — OXYCODONE-ACETAMINOPHEN 5-325 MG PO TABS: 1.0000 | ORAL_TABLET | ORAL | Status: DC | PRN

## 2013-04-11 MED ORDER — MIDAZOLAM HCL 5 MG/5ML IJ SOLN
INTRAMUSCULAR | Status: DC | PRN
  Administered 2013-04-11: 2 mg via INTRAVENOUS

## 2013-04-11 MED ORDER — URELLE 81 MG PO TABS: 1.0000 | ORAL_TABLET | Freq: Three times a day (TID) | ORAL | Status: DC

## 2013-04-11 MED ORDER — SODIUM CHLORIDE 0.9 % IR SOLN
Status: DC | PRN
  Administered 2013-04-11: 3000 mL

## 2013-04-11 MED ORDER — CEFAZOLIN SODIUM-DEXTROSE 2-3 GM-% IV SOLR
2.0000 g | INTRAVENOUS | Status: AC
  Administered 2013-04-11: 2 g via INTRAVENOUS
  Filled 2013-04-11: qty 50

## 2013-04-11 MED ORDER — LACTATED RINGERS IV SOLN
INTRAVENOUS | Status: DC
Start: 2013-04-11 — End: 2013-04-11
  Administered 2013-04-11: 10:00:00 via INTRAVENOUS
  Filled 2013-04-11: qty 1000

## 2013-04-11 MED ORDER — DEXAMETHASONE SODIUM PHOSPHATE 4 MG/ML IJ SOLN
INTRAMUSCULAR | Status: DC | PRN
  Administered 2013-04-11: 10 mg via INTRAVENOUS

## 2013-04-11 MED ORDER — ACETAMINOPHEN 10 MG/ML IV SOLN
INTRAVENOUS | Status: DC | PRN
  Administered 2013-04-11: 1000 mg via INTRAVENOUS

## 2013-04-11 MED ORDER — PROPOFOL 10 MG/ML IV BOLUS
INTRAVENOUS | Status: DC | PRN
  Administered 2013-04-11: 300 mg via INTRAVENOUS

## 2013-04-11 MED ORDER — URELLE 81 MG PO TABS
1.0000 | ORAL_TABLET | Freq: Four times a day (QID) | ORAL | Status: DC
  Administered 2013-04-11: 81 mg via ORAL
  Filled 2013-04-11: qty 1

## 2013-04-11 MED ORDER — OXYCODONE-ACETAMINOPHEN 5-325 MG PO TABS
1.0000 | ORAL_TABLET | Freq: Once | ORAL | Status: AC
  Administered 2013-04-11: 1 via ORAL
  Filled 2013-04-11: qty 1

## 2013-04-11 MED ORDER — FENTANYL CITRATE 0.05 MG/ML IJ SOLN
INTRAMUSCULAR | Status: DC | PRN
  Administered 2013-04-11 (×3): 50 ug via INTRAVENOUS

## 2013-04-11 MED ORDER — LIDOCAINE HCL (CARDIAC) 20 MG/ML IV SOLN
INTRAVENOUS | Status: DC | PRN
  Administered 2013-04-11: 100 mg via INTRAVENOUS

## 2013-04-11 MED ORDER — FENTANYL CITRATE 0.05 MG/ML IJ SOLN
25.0000 ug | INTRAMUSCULAR | Status: DC | PRN
  Filled 2013-04-11: qty 1

## 2013-04-11 MED ORDER — BELLADONNA ALKALOIDS-OPIUM 16.2-60 MG RE SUPP
RECTAL | Status: DC | PRN
  Administered 2013-04-11: 1 via RECTAL

## 2013-04-11 MED ORDER — CEPHALEXIN 500 MG PO CAPS: 500.0000 mg | ORAL_CAPSULE | Freq: Two times a day (BID) | ORAL | Status: DC

## 2013-04-11 MED ORDER — IOHEXOL 350 MG/ML SOLN
INTRAVENOUS | Status: DC | PRN
  Administered 2013-04-11: 20 mL

## 2013-04-11 MED ORDER — EPHEDRINE SULFATE 50 MG/ML IJ SOLN
INTRAMUSCULAR | Status: DC | PRN
  Administered 2013-04-11 (×2): 10 mg via INTRAVENOUS

## 2013-04-11 MED ORDER — LACTATED RINGERS IV SOLN
INTRAVENOUS | Status: DC
  Filled 2013-04-11: qty 1000

## 2013-04-11 SURGICAL SUPPLY — 36 items
ADAPTER CATH URET PLST 4-6FR (CATHETERS) IMPLANT
ADPR CATH URET STRL DISP 4-6FR (CATHETERS)
BAG DRAIN URO-CYSTO SKYTR STRL (DRAIN) ×2 IMPLANT
BAG DRN UROCATH (DRAIN) ×1
BASKET LASER NITINOL 1.9FR (BASKET) IMPLANT
BASKET STNLS GEMINI 4WIRE 3FR (BASKET) IMPLANT
BASKET ZERO TIP NITINOL 2.4FR (BASKET) ×1 IMPLANT
BOOTIES KNEE HIGH SLOAN (MISCELLANEOUS) ×2 IMPLANT
BRUSH URET BIOPSY 3F (UROLOGICAL SUPPLIES) IMPLANT
BSKT STON RTRVL 120 1.9FR (BASKET)
BSKT STON RTRVL GEM 120X11 3FR (BASKET)
BSKT STON RTRVL ZERO TP 2.4FR (BASKET) ×1
CANISTER SUCT LVC 12 LTR MEDI- (MISCELLANEOUS) IMPLANT
CATH CLEAR GEL 3F BACKSTOP (CATHETERS) ×1 IMPLANT
CATH INTERMIT  6FR 70CM (CATHETERS) IMPLANT
CATH URET 5FR 28IN CONE TIP (BALLOONS)
CATH URET 5FR 28IN OPEN ENDED (CATHETERS) IMPLANT
CATH URET 5FR 70CM CONE TIP (BALLOONS) IMPLANT
CATH URET DUAL LUMEN 6-10FR 50 (CATHETERS) IMPLANT
CLOTH BEACON ORANGE TIMEOUT ST (SAFETY) ×2 IMPLANT
DRAPE CAMERA CLOSED 9X96 (DRAPES) ×2 IMPLANT
ELECT REM PT RETURN 9FT ADLT (ELECTROSURGICAL)
ELECTRODE REM PT RTRN 9FT ADLT (ELECTROSURGICAL) IMPLANT
GLOVE BIO SURGEON STRL SZ7 (GLOVE) ×2 IMPLANT
GOWN PREVENTION PLUS LG XLONG (DISPOSABLE) ×2 IMPLANT
GOWN STRL REIN XL XLG (GOWN DISPOSABLE) ×2 IMPLANT
GUIDEWIRE 0.038 PTFE COATED (WIRE) IMPLANT
GUIDEWIRE ANG ZIPWIRE 038X150 (WIRE) IMPLANT
GUIDEWIRE STR DUAL SENSOR (WIRE) IMPLANT
IV NS IRRIG 3000ML ARTHROMATIC (IV SOLUTION) ×4 IMPLANT
KIT BALLIN UROMAX 15FX10 (LABEL) IMPLANT
KIT BALLN UROMAX 15FX4 (MISCELLANEOUS) IMPLANT
KIT BALLN UROMAX 26 75X4 (MISCELLANEOUS)
SET HIGH PRES BAL DIL (LABEL)
SHEATH ACCESS URETERAL 38CM (SHEATH) IMPLANT
SHEATH ACCESS URETERAL 54CM (SHEATH) IMPLANT

## 2013-04-11 NOTE — Interval H&P Note (Signed)
History and Physical Interval Note:  04/11/2013 11:02 AM  Shawn [name]  has presented today for surgery, with the diagnosis of Left Lower Ureteral Stone  The various methods of treatment have been discussed with the patient and family. After consideration of risks, benefits and other options for treatment, the patient has consented to  Procedure(s) with comments: CYSTOSCOPY/RETROGRADE/URETEROSCOPY/STONE EXTRACTION WITH BASKET (Left) - BASKET STONE EXTRACTION WITH URETEROSCOPE   as a surgical intervention .  The patient's history has been reviewed, patient examined, no change in status, stable for surgery.  I have reviewed the patient's chart and labs.  Questions were answered to the patient's satisfaction.     Jethro Bolus I

## 2013-04-11 NOTE — Anesthesia Preprocedure Evaluation (Signed)
Anesthesia Evaluation  Patient identified by MRN, date of birth, ID band Patient awake    Reviewed: Allergy & Precautions, H&P , NPO status , Patient's Chart, lab work & pertinent test results, reviewed documented beta blocker date and time   Airway Mallampati: II TM Distance: >3 FB Neck ROM: full    Dental no notable dental hx. (+) Teeth Intact and Dental Advisory Given   Pulmonary neg pulmonary ROS,  breath sounds clear to auscultation  Pulmonary exam normal       Cardiovascular Exercise Tolerance: Good hypertension, Pt. on home beta blockers Rhythm:regular Rate:Normal     Neuro/Psych negative neurological ROS  negative psych ROS   GI/Hepatic negative GI ROS, Neg liver ROS,   Endo/Other  negative endocrine ROS  Renal/GU negative Renal ROS  negative genitourinary   Musculoskeletal   Abdominal   Peds  Hematology negative hematology ROS (+)   Anesthesia Other Findings   Reproductive/Obstetrics negative OB ROS                           Anesthesia Physical Anesthesia Plan  ASA: II  Anesthesia Plan: General   Post-op Pain Management:    Induction: Intravenous  Airway Management Planned: LMA  Additional Equipment:   Intra-op Plan:   Post-operative Plan:   Informed Consent: I have reviewed the patients History and Physical, chart, labs and discussed the procedure including the risks, benefits and alternatives for the proposed anesthesia with the patient or authorized representative who has indicated his/her understanding and acceptance.   Dental Advisory Given  Plan Discussed with: CRNA and Surgeon  Anesthesia Plan Comments:         Anesthesia Quick Evaluation

## 2013-04-11 NOTE — Anesthesia Procedure Notes (Signed)
Procedure Name: LMA Insertion Date/Time: 04/11/2013 11:08 AM Performed by: Norva Pavlov Pre-anesthesia Checklist: Patient identified, Emergency Drugs available, Suction available and Patient being monitored Patient Re-evaluated:Patient Re-evaluated prior to inductionOxygen Delivery Method: Circle System Utilized Preoxygenation: Pre-oxygenation with 100% oxygen Intubation Type: IV induction Ventilation: Mask ventilation without difficulty LMA: LMA inserted LMA Size: 5.0 Number of attempts: 1 Airway Equipment and Method: bite block Placement Confirmation: positive ETCO2 Tube secured with: Tape Dental Injury: Teeth and Oropharynx as per pre-operative assessment

## 2013-04-11 NOTE — Transfer of Care (Signed)
Immediate Anesthesia Transfer of Care Note  Patient: Shawn [name]  Procedure(s) Performed: Procedure(s) (LRB): CYSTOSCOPY/RETROGRADE/URETEROSCOPY/STONE EXTRACTION WITH BASKET (Left)  Patient Location: PACU  Anesthesia Type: General  Level of Consciousness: awake, alert  and oriented  Airway & Oxygen Therapy: Patient Spontanous Breathing and Patient connected to face mask oxygen  Post-op Assessment: Report given to PACU RN and Post -op Vital signs reviewed and stable  Post vital signs: Reviewed and stable  Complications: No apparent anesthesia complications

## 2013-04-11 NOTE — Interval H&P Note (Signed)
History and Physical Interval Note:  04/11/2013 8:26 AM  Shawn [name]  has presented today for surgery, with the diagnosis of Left Lower Ureteral Stone  The various methods of treatment have been discussed with the patient and family. After consideration of risks, benefits and other options for treatment, the patient has consented to  Procedure(s) with comments: CYSTOSCOPY/RETROGRADE/URETEROSCOPY/STONE EXTRACTION WITH BASKET (Left) - BASKET STONE EXTRACTION WITH URETEROSCOPE   as a surgical intervention .  The patient's history has been reviewed, patient examined, no change in status, stable for surgery.  I have reviewed the patient's chart and labs.  Questions were answered to the patient's satisfaction.   ctive Problems Problems  1. Distal Ureteral Stone On The Left 592.1 2. 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  Hx of a 4mm Lt distal ureteral stone.  Originally seen in the ER on 03/14/13 for Lt flank pain.  CT showed a 4mm Lt distal ureteral stone with moderate obstruction.       Hx of a previously having a 4mm Rt distal obstructing ureteral stone requiring cystoscopy, Rt RPG, Rt ureteroscopy, basket extraction & Rt JJ stent on 08/24/12.     [place]: 1 Dr. Tomasita Morrow ( hx of  6/day x 35 yrs). ; fast foods: occasional only: 2x/week. Water: 3-4 8 oz glasses.   Past Medical History Problems  1. History of  Hypertension 401.9 2. 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 History of  Appendectomy 2. History of  Cystoscopy With Insertion Of Ureteral Stent Right 3. History of  Cystoscopy With Ureteroscopy With Removal Of Calculus 4. History of  Repair Of Retinal Detachment Right  Current Meds 1. Lisinopril 10 MG Oral Tablet; Therapy: (Recorded:18Sep2013) to 2. Meloxicam 7.5 MG Oral Tablet; Therapy: (Recorded:01Apr2014) to 3. Metoprolol Tartrate 50 MG  Oral Tablet; Therapy: (Recorded:01Apr2014) to 4. Rapaflo 8 MG Oral Capsule; TAKE 1 CAPSULE DAILY WITH FOOD; Therapy: 01Apr2014 to  (Evaluate:01May2014)  Requested for: 01Apr2014; Last Rx:01Apr2014  Allergies Medication  1. No Known Drug Allergies  Family History Problems  1. Maternal history of  Diabetes Mellitus V18.0 2. Family history of  Family Health Status - Father's Age 79. Family history of  Family Health Status - Mother's Age 22. Family history of  Family Health Status Number Of Children 1 son 5. Paternal history of  Heart Disease V17.49 6. Paternal history of  Nephrolithiasis  Social History Problems  1. Alcohol Use 1-2 per mo 2. Caffeine Use 3-4 per day 3. Marital History - Currently Married 4. Never A Smoker 5. Occupation: Engineer, water  Review of Systems Genitourinary, constitutional, skin, eye, otolaryngeal, hematologic/lymphatic, cardiovascular, pulmonary, endocrine, musculoskeletal, gastrointestinal, neurological and psychiatric system(s) were reviewed and pertinent findings if present are noted.    Vitals Vital Signs [Data Includes: Last 1 Day]  21Apr2014 02:56PM  Blood Pressure: 116 / 78 Temperature: 98.3 F Heart Rate: 102  Results/Data Urine [Data Includes: Last 1 Day]   21Apr2014  COLOR AMBER   APPEARANCE CLEAR   SPECIFIC GRAVITY 1.025   pH 6.0   GLUCOSE NEG mg/dL  BILIRUBIN NEG   KETONE TRACE mg/dL  BLOOD NEG   PROTEIN NEG mg/dL  UROBILINOGEN 0.2 mg/dL  NITRITE NEG   LEUKOCYTE ESTERASE NEG   Selected Results  UA With REFLEX 21Apr2014 02:27PM Jethro Bolus   Test Name Result Flag Reference  COLOR AMBER A YELLOW  BIOCHEMICALS MAY BE AFFECTED BY THE COLOR OF THE URINE.  APPEARANCE CLEAR  CLEAR  SPECIFIC GRAVITY 1.025  1.005-1.030  pH 6.0  5.0-8.0  GLUCOSE NEG mg/dL  NEG  BILIRUBIN NEG  NEG  KETONE TRACE mg/dL A NEG  BLOOD NEG  NEG  PROTEIN NEG mg/dL  NEG  UROBILINOGEN 0.2 mg/dL  1.6-1.0  NITRITE NEG  NEG  LEUKOCYTE ESTERASE NEG   NEG   Assessment Assessed  1. Distal Ureteral Stone On The Left 592.1   52 yo male with L lower ureteral stone, with CT today-awaiting CT overread. He is having his 25th anniversary in May, and is going on a Church mission to [place] in August. We will await his CT overread, and then plan cysto Left retrograde pyelogram and basket extraction in June.   Plan Health Maintenance (V70.0)  1. UA With REFLEX  Done: 21Apr2014 02:27PM   Plan basket extraction for May/June.   Signatures Electronically signed by : Jethro Bolus, M.D.; Apr 04 2013  3:43PM  ctive Problems Problems  1. Distal Ureteral Stone On The Left 592.1 2. 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  Hx of a 4mm Lt distal ureteral stone.  Originally seen in the ER on 03/14/13 for Lt flank pain.  CT showed a 4mm Lt distal ureteral stone with moderate obstruction.       Hx of a previously having a 4mm Rt distal obstructing ureteral stone requiring cystoscopy, Rt RPG, Rt ureteroscopy, basket extraction & Rt JJ stent on 08/24/12.     [place]: 1 Dr. Tomasita Morrow ( hx of  6/day x 35 yrs). ; fast foods: occasional only: 2x/week. Water: 3-4 8 oz glasses.   Past Medical History Problems  1. History of  Hypertension 401.9 2. 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 History of  Appendectomy 2. History of  Cystoscopy With Insertion Of Ureteral Stent Right 3. History of  Cystoscopy With Ureteroscopy With Removal Of Calculus 4. History of  Repair Of Retinal Detachment Right  Current Meds 1. Lisinopril 10 MG Oral Tablet; Therapy: (Recorded:18Sep2013) to 2. Meloxicam 7.5 MG Oral Tablet; Therapy: (Recorded:01Apr2014) to 3. Metoprolol Tartrate 50 MG Oral Tablet; Therapy: (Recorded:01Apr2014) to 4. Rapaflo 8 MG Oral Capsule; TAKE 1 CAPSULE DAILY WITH FOOD; Therapy: 01Apr2014 to   (Evaluate:01May2014)  Requested for: 01Apr2014; Last Rx:01Apr2014  Allergies Medication  1. No Known Drug Allergies  Family History Problems  1. Maternal history of  Diabetes Mellitus V18.0 2. Family history of  Family Health Status - Father's Age 1. Family history of  Family Health Status - Mother's Age 14. Family history of  Family Health Status Number Of Children 1 son 5. Paternal history of  Heart Disease V17.49 6. Paternal history of  Nephrolithiasis  Social History Problems  1. Alcohol Use 1-2 per mo 2. Caffeine Use 3-4 per day 3. Marital History - Currently Married 4. Never A Smoker 5. Occupation: Engineer, water  Review of Systems Genitourinary, constitutional, skin, eye, otolaryngeal, hematologic/lymphatic, cardiovascular, pulmonary, endocrine, musculoskeletal, gastrointestinal, neurological and psychiatric system(s) were reviewed and pertinent findings if present are noted.    Vitals Vital Signs [Data Includes: Last 1 Day]  21Apr2014 02:56PM  Blood Pressure: 116 / 78 Temperature: 98.3 F Heart Rate: 102  Results/Data Urine [Data Includes: Last 1 Day]  21Apr2014  COLOR AMBER   APPEARANCE CLEAR   SPECIFIC GRAVITY 1.025   pH 6.0   GLUCOSE NEG mg/dL  BILIRUBIN NEG   KETONE TRACE mg/dL  BLOOD NEG   PROTEIN NEG mg/dL  UROBILINOGEN 0.2 mg/dL  NITRITE NEG   LEUKOCYTE ESTERASE NEG   Selected Results  UA With REFLEX 21Apr2014 02:27PM Jethro Bolus   Test Name Result Flag Reference  COLOR AMBER A YELLOW  BIOCHEMICALS MAY BE AFFECTED BY THE COLOR OF THE URINE.  APPEARANCE CLEAR  CLEAR  SPECIFIC GRAVITY 1.025  1.005-1.030  pH 6.0  5.0-8.0  GLUCOSE NEG mg/dL  NEG  BILIRUBIN NEG  NEG  KETONE TRACE mg/dL A NEG  BLOOD NEG  NEG  PROTEIN NEG mg/dL  NEG  UROBILINOGEN 0.2 mg/dL  0.4-5.4  NITRITE NEG  NEG  LEUKOCYTE ESTERASE NEG  NEG   Assessment Assessed  1. Distal Ureteral Stone On The Left 592.1   52 yo male with L lower ureteral stone, with CT  today-awaiting CT overread. He is having his 25th anniversary in May, and is going on a Church mission to [place] in August. We will await his CT overread, and then plan cysto Left retrograde pyelogram and basket extraction in June.   Plan Health Maintenance (V70.0)  1. UA With REFLEX  Done: 21Apr2014 02:27PM   Plan basket extraction for May/June.   Signatures Electronically signed by : Jethro Bolus, M.D.; Apr 04 2013  3:43PM    Jethro Bolus I

## 2013-04-11 NOTE — Anesthesia Postprocedure Evaluation (Signed)
  Anesthesia Post-op Note  Patient: Shawn [name]  Procedure(s) Performed: Procedure(s) (LRB): CYSTOSCOPY/RETROGRADE/URETEROSCOPY/STONE EXTRACTION WITH BASKET (Left)  Patient Location: PACU  Anesthesia Type: General  Level of Consciousness: awake and alert   Airway and Oxygen Therapy: Patient Spontanous Breathing  Post-op Pain: mild  Post-op Assessment: Post-op Vital signs reviewed, Patient's Cardiovascular Status Stable, Respiratory Function Stable, Patent Airway and No signs of Nausea or vomiting  Last Vitals:  Filed Vitals:   04/11/13 1150  BP: 125/78  Pulse:   Temp: 37 C  Resp: 14    Post-op Vital Signs: stable   Complications: No apparent anesthesia complications

## 2013-04-11 NOTE — H&P (View-Only) (Signed)
To [facility] at 0930- Istat on arrival,Ekg in epic-Npo after Mn-instructed to take metoprolol with small amt water that am. 

## 2013-04-11 NOTE — Op Note (Signed)
Pre-operative diagnosis :    Left lower ureteral calculus  Postoperative diagnosis: Same     Operation:   Cystourethroscopy, left retrograde PolyGram interpretation, left ureteroscopy, instillation of backstop, basket extraction of left midureteral stone   Surgeon:  S. Patsi Sears, MD  First assistant:  None   Anesthesia:  General  LMA   Preparation:  After appropriate preanesthesia, the patient was brought to the operating room, placed on the operating table in the dorsal supine position where general LMA anesthesia was introduced. He was replaced in the dorsal lithotomy position with pubis was prepped with Betadine solution and draped in usual fashion. The left arm was previously marked. History was reviewed.   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 Hx of a 4mm Lt distal ureteral stone. Originally seen in the ER on 03/14/13 for Lt flank pain. CT showed a 4mm Lt distal ureteral stone with moderate obstruction.  Hx of a previously having a 4mm Rt distal obstructing ureteral stone requiring cystoscopy, Rt RPG, Rt ureteroscopy, basket extraction & Rt JJ stent on 08/24/12.  [place]: 1 Dr. Tomasita Morrow ( hx of 6/day x 35 yrs). ; fast foods: occasional only: 2x/week. Water: 3-4 8 oz glasses.      Statement of  Likelihood of Success: Excellent. TIME-OUT observed.:  Procedure:  Cystourethroscopy was accomplished, which showed normal appearing urethra. The bladder neck was normal. The bladder base showed normal trigone. There was no trabeculation. Clear reflux was seen from the right orifice. The left orifice was in normal position on the trigone. There was no evidence of bladder stone or tumor or diverticular formation.  Left retrograde pyelogram revealed stone in the ureterovesical junction of the left lower ureter. The left ureteral orifice was very small, and a guidewire was placed through the left ureteral orifice into the left  kidney under fluoroscopic control. The left ureteral orifice would not easily admit the short 6 French ureteroscope because of a very small ureteral orifice opening. Finally, however, the scope was passed into the ureteral orifice, and I was able to identify that the ureteral wire traversed submucosally, and this was removed, and replaced. It is felt that the stone forceps of the wire lateralward, submucosally. The fluid of the ureteroscope pushed the stone back into the mid ureter. Following replacement of the wire into the renal pelvis, the ureteroscope was replaced, and the stone was identified the mid ureter. The stone was multifaceted and was photo documented. A catheter was placed above the stone, and Backstop was placed above the stone to prevent further proximal migration. With this in place, a 4 wire flat basket was placed around the stone, and the stone was extracted without difficulty. Repeat ureteroscopy revealed no evidence of any other stone. The ureteral orifice appeared to be dilated. I elected to not leave a double-J stent. The safety wire was removed. The patient received IV Toradol, and was awakened, and taken to recovery room in good condition.

## 2013-04-11 NOTE — H&P (Signed)
ctive Problems Problems  1. Distal Ureteral Stone On The Left 592.1 2. 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  Hx of a 4mm Lt distal ureteral stone.  Originally seen in the ER on 03/14/13 for Lt flank pain.  CT showed a 4mm Lt distal ureteral stone with moderate obstruction.       Hx of a previously having a 4mm Rt distal obstructing ureteral stone requiring cystoscopy, Rt RPG, Rt ureteroscopy, basket extraction & Rt JJ stent on 08/24/12.     [place]: 1 Dr. Tomasita Morrow ( hx of  6/day x 35 yrs). ; fast foods: occasional only: 2x/week. Water: 3-4 8 oz glasses.   Past Medical History Problems  1. History of  Hypertension 401.9 2. 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 History of  Appendectomy 2. History of  Cystoscopy With Insertion Of Ureteral Stent Right 3. History of  Cystoscopy With Ureteroscopy With Removal Of Calculus 4. History of  Repair Of Retinal Detachment Right  Current Meds 1. Lisinopril 10 MG Oral Tablet; Therapy: (Recorded:18Sep2013) to 2. Meloxicam 7.5 MG Oral Tablet; Therapy: (Recorded:01Apr2014) to 3. Metoprolol Tartrate 50 MG Oral Tablet; Therapy: (Recorded:01Apr2014) to 4. Rapaflo 8 MG Oral Capsule; TAKE 1 CAPSULE DAILY WITH FOOD; Therapy: 01Apr2014 to  (Evaluate:01May2014)  Requested for: 01Apr2014; Last Rx:01Apr2014  Allergies Medication  1. No Known Drug Allergies  Family History Problems  1. Maternal history of  Diabetes Mellitus V18.0 2. Family history of  Family Health Status - Father's Age 501. Family history of  Family Health Status - Mother's Age 50. Family history of  Family Health Status Number Of Children 1 son 5. Paternal history of  Heart Disease V17.49 6. Paternal history of  Nephrolithiasis  Social History Problems  1. Alcohol Use 1-2 per mo 2. Caffeine Use 3-4 per day 3. Marital History - Currently  Married 4. Never A Smoker 5. Occupation: Engineer, water  Review of Systems Genitourinary, constitutional, skin, eye, otolaryngeal, hematologic/lymphatic, cardiovascular, pulmonary, endocrine, musculoskeletal, gastrointestinal, neurological and psychiatric system(s) were reviewed and pertinent findings if present are noted.    Vitals Vital Signs [Data Includes: Last 1 Day]  21Apr2014 02:56PM  Blood Pressure: 116 / 78 Temperature: 98.3 F Heart Rate: 102  Results/Data Urine [Data Includes: Last 1 Day]   21Apr2014  COLOR AMBER   APPEARANCE CLEAR   SPECIFIC GRAVITY 1.025   pH 6.0   GLUCOSE NEG mg/dL  BILIRUBIN NEG   KETONE TRACE mg/dL  BLOOD NEG   PROTEIN NEG mg/dL  UROBILINOGEN 0.2 mg/dL  NITRITE NEG   LEUKOCYTE ESTERASE NEG   Selected Results  UA With REFLEX 21Apr2014 02:27PM Jethro Bolus   Test Name Result Flag Reference  COLOR AMBER A YELLOW  BIOCHEMICALS MAY BE AFFECTED BY THE COLOR OF THE URINE.  APPEARANCE CLEAR  CLEAR  SPECIFIC GRAVITY 1.025  1.005-1.030  pH 6.0  5.0-8.0  GLUCOSE NEG mg/dL  NEG  BILIRUBIN NEG  NEG  KETONE TRACE mg/dL A NEG  BLOOD NEG  NEG  PROTEIN NEG mg/dL  NEG  UROBILINOGEN 0.2 mg/dL  1.6-1.0  NITRITE NEG  NEG  LEUKOCYTE ESTERASE NEG  NEG   Assessment Assessed  1. Distal Ureteral Stone On The Left 592.1   52 yo male with L lower ureteral stone, with CT today-awaiting CT overread. He is having his 25th anniversary in  May, and is going on a Church mission to [place] in August. We will await his CT overread, and then plan cysto Left retrograde pyelogram and basket extraction in June.   Plan Health Maintenance (V70.0)  1. UA With REFLEX  Done: 21Apr2014 02:27PM   Plan basket extraction for May/June.   Signatures Electronically signed by : Jethro Bolus, M.D.; Apr 04 2013  3:43PM  ctive Problems Problems  1. Distal Ureteral Stone On The Left 592.1 2. 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  Hx of a 4mm Lt distal ureteral stone.  Originally seen in the ER on 03/14/13 for Lt flank pain.  CT showed a 4mm Lt distal ureteral stone with moderate obstruction.       Hx of a previously having a 4mm Rt distal obstructing ureteral stone requiring cystoscopy, Rt RPG, Rt ureteroscopy, basket extraction & Rt JJ stent on 08/24/12.     [place]: 1 Dr. Tomasita Morrow ( hx of  6/day x 35 yrs). ; fast foods: occasional only: 2x/week. Water: 3-4 8 oz glasses.   Past Medical History Problems  1. History of  Hypertension 401.9 2. 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 History of  Appendectomy 2. History of  Cystoscopy With Insertion Of Ureteral Stent Right 3. History of  Cystoscopy With Ureteroscopy With Removal Of Calculus 4. History of  Repair Of Retinal Detachment Right  Current Meds 1. Lisinopril 10 MG Oral Tablet; Therapy: (Recorded:18Sep2013) to 2. Meloxicam 7.5 MG Oral Tablet; Therapy: (Recorded:01Apr2014) to 3. Metoprolol Tartrate 50 MG Oral Tablet; Therapy: (Recorded:01Apr2014) to 4. Rapaflo 8 MG Oral Capsule; TAKE 1 CAPSULE DAILY WITH FOOD; Therapy: 01Apr2014 to  (Evaluate:01May2014)  Requested for: 01Apr2014; Last Rx:01Apr2014  Allergies Medication  1. No Known Drug Allergies  Family History Problems  1. Maternal history of  Diabetes Mellitus V18.0 2. Family history of  Family Health Status - Father's Age 18. Family history of  Family Health Status - Mother's Age 63. Family history of  Family Health Status Number Of Children 1 son 5. Paternal history of  Heart Disease V17.49 6. Paternal history of  Nephrolithiasis  Social History Problems  1. Alcohol Use 1-2 per mo 2. Caffeine Use 3-4 per day 3. Marital History - Currently Married 4. Never A Smoker 5. Occupation: Engineer, water  Review of Systems Genitourinary, constitutional, skin, eye, otolaryngeal,  hematologic/lymphatic, cardiovascular, pulmonary, endocrine, musculoskeletal, gastrointestinal, neurological and psychiatric system(s) were reviewed and pertinent findings if present are noted.    Vitals Vital Signs [Data Includes: Last 1 Day]  21Apr2014 02:56PM  Blood Pressure: 116 / 78 Temperature: 98.3 F Heart Rate: 102  Results/Data Urine [Data Includes: Last 1 Day]   21Apr2014  COLOR AMBER   APPEARANCE CLEAR   SPECIFIC GRAVITY 1.025   pH 6.0   GLUCOSE NEG mg/dL  BILIRUBIN NEG   KETONE TRACE mg/dL  BLOOD NEG   PROTEIN NEG mg/dL  UROBILINOGEN 0.2 mg/dL  NITRITE NEG   LEUKOCYTE ESTERASE NEG   Selected Results  UA With REFLEX 21Apr2014 02:27PM Jethro Bolus   Test Name Result Flag Reference  COLOR AMBER A YELLOW  BIOCHEMICALS MAY BE AFFECTED BY THE COLOR OF THE URINE.  APPEARANCE CLEAR  CLEAR  SPECIFIC GRAVITY 1.025  1.005-1.030  pH 6.0  5.0-8.0  GLUCOSE NEG mg/dL  NEG  BILIRUBIN NEG  NEG  KETONE TRACE mg/dL A NEG  BLOOD NEG  NEG  PROTEIN NEG mg/dL  NEG  UROBILINOGEN 0.2 mg/dL  0.8-6.5  NITRITE NEG  NEG  LEUKOCYTE ESTERASE NEG  NEG   Assessment Assessed  1. Distal Ureteral Stone On The Left 592.1   52 yo male with L lower ureteral stone, with CT today-awaiting CT overread. He is having his 25th anniversary in May, and is going on a Church mission to [place] in August. We will await his CT overread, and then plan cysto Left retrograde pyelogram and basket extraction in June.   Plan Health Maintenance (V70.0)  1. UA With REFLEX  Done: 21Apr2014 02:27PM   Plan basket extraction for May/June.   Signatures Electronically signed by : Jethro Bolus, M.D.; Apr 04 2013  3:43PM  ctive Problems Problems  1. Distal Ureteral Stone On The Left 592.1 2. 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  Hx of a 4mm Lt distal ureteral stone.   Originally seen in the ER on 03/14/13 for Lt flank pain.  CT showed a 4mm Lt distal ureteral stone with moderate obstruction.       Hx of a previously having a 4mm Rt distal obstructing ureteral stone requiring cystoscopy, Rt RPG, Rt ureteroscopy, basket extraction & Rt JJ stent on 08/24/12.     [place]: 1 Dr. Tomasita Morrow ( hx of  6/day x 35 yrs). ; fast foods: occasional only: 2x/week. Water: 3-4 8 oz glasses.   Past Medical History Problems  1. History of  Hypertension 401.9 2. 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 History of  Appendectomy 2. History of  Cystoscopy With Insertion Of Ureteral Stent Right 3. History of  Cystoscopy With Ureteroscopy With Removal Of Calculus 4. History of  Repair Of Retinal Detachment Right  Current Meds 1. Lisinopril 10 MG Oral Tablet; Therapy: (Recorded:18Sep2013) to 2. Meloxicam 7.5 MG Oral Tablet; Therapy: (Recorded:01Apr2014) to 3. Metoprolol Tartrate 50 MG Oral Tablet; Therapy: (Recorded:01Apr2014) to 4. Rapaflo 8 MG Oral Capsule; TAKE 1 CAPSULE DAILY WITH FOOD; Therapy: 01Apr2014 to  (Evaluate:01May2014)  Requested for: 01Apr2014; Last Rx:01Apr2014  Allergies Medication  1. No Known Drug Allergies  Family History Problems  1. Maternal history of  Diabetes Mellitus V18.0 2. Family history of  Family Health Status - Father's Age 27. Family history of  Family Health Status - Mother's Age 83. Family history of  Family Health Status Number Of Children 1 son 5. Paternal history of  Heart Disease V17.49 6. Paternal history of  Nephrolithiasis  Social History Problems  1. Alcohol Use 1-2 per mo 2. Caffeine Use 3-4 per day 3. Marital History - Currently Married 4. Never A Smoker 5. Occupation: Engineer, water  Review of Systems Genitourinary, constitutional, skin, eye, otolaryngeal, hematologic/lymphatic, cardiovascular, pulmonary, endocrine, musculoskeletal, gastrointestinal, neurological and psychiatric system(s)  were reviewed and pertinent findings if present are noted.    Vitals Vital Signs [Data Includes: Last 1 Day]  21Apr2014 02:56PM  Blood Pressure: 116 / 78 Temperature: 98.3 F Heart Rate: 102  Results/Data Urine [Data Includes: Last 1 Day]   21Apr2014  COLOR AMBER   APPEARANCE CLEAR   SPECIFIC GRAVITY 1.025   pH 6.0   GLUCOSE NEG mg/dL  BILIRUBIN NEG   KETONE TRACE mg/dL  BLOOD NEG   PROTEIN NEG mg/dL  UROBILINOGEN 0.2  mg/dL  NITRITE NEG   LEUKOCYTE ESTERASE NEG   Selected Results  UA With REFLEX 21Apr2014 02:27PM Jethro Bolus   Test Name Result Flag Reference  COLOR AMBER A YELLOW  BIOCHEMICALS MAY BE AFFECTED BY THE COLOR OF THE URINE.  APPEARANCE CLEAR  CLEAR  SPECIFIC GRAVITY 1.025  1.005-1.030  pH 6.0  5.0-8.0  GLUCOSE NEG mg/dL  NEG  BILIRUBIN NEG  NEG  KETONE TRACE mg/dL A NEG  BLOOD NEG  NEG  PROTEIN NEG mg/dL  NEG  UROBILINOGEN 0.2 mg/dL  4.0-9.8  NITRITE NEG  NEG  LEUKOCYTE ESTERASE NEG  NEG   Assessment Assessed  1. Distal Ureteral Stone On The Left 592.1   52 yo male with L lower ureteral stone, with CT today-awaiting CT overread. He is having his 25th anniversary in May, and is going on a Church mission to [place] in August. We will await his CT overread, and then plan cysto Left retrograde pyelogram and basket extraction in June.   Plan Health Maintenance (V70.0)  1. UA With REFLEX  Done: 21Apr2014 02:27PM   Plan basket extraction for May/June.   Signatures Electronically signed by : Jethro Bolus, M.D.; Apr 04 2013  3:43PM   ctive Problems Problems  1. Distal Ureteral Stone On The Left 592.1 2. 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  Hx of a 4mm Lt distal ureteral stone.  Originally seen in the ER on 03/14/13 for Lt flank pain.  CT showed a 4mm Lt distal ureteral stone with moderate obstruction.       Hx of a  previously having a 4mm Rt distal obstructing ureteral stone requiring cystoscopy, Rt RPG, Rt ureteroscopy, basket extraction & Rt JJ stent on 08/24/12.     [place]: 1 Dr. Tomasita Morrow ( hx of  6/day x 35 yrs). ; fast foods: occasional only: 2x/week. Water: 3-4 8 oz glasses.   Past Medical History Problems  1. History of  Hypertension 401.9 2. 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 History of  Appendectomy 2. History of  Cystoscopy With Insertion Of Ureteral Stent Right 3. History of  Cystoscopy With Ureteroscopy With Removal Of Calculus 4. History of  Repair Of Retinal Detachment Right  Current Meds 1. Lisinopril 10 MG Oral Tablet; Therapy: (Recorded:18Sep2013) to 2. Meloxicam 7.5 MG Oral Tablet; Therapy: (Recorded:01Apr2014) to 3. Metoprolol Tartrate 50 MG Oral Tablet; Therapy: (Recorded:01Apr2014) to 4. Rapaflo 8 MG Oral Capsule; TAKE 1 CAPSULE DAILY WITH FOOD; Therapy: 01Apr2014 to  (Evaluate:01May2014)  Requested for: 01Apr2014; Last Rx:01Apr2014  Allergies Medication  1. No Known Drug Allergies  Family History Problems  1. Maternal history of  Diabetes Mellitus V18.0 2. Family history of  Family Health Status - Father's Age 54. Family history of  Family Health Status - Mother's Age 80. Family history of  Family Health Status Number Of Children 1 son 5. Paternal history of  Heart Disease V17.49 6. Paternal history of  Nephrolithiasis  Social History Problems  1. Alcohol Use 1-2 per mo 2. Caffeine Use 3-4 per day 3. Marital History - Currently Married 4. Never A Smoker 5. Occupation: Engineer, water  Review of Systems Genitourinary, constitutional, skin, eye, otolaryngeal, hematologic/lymphatic, cardiovascular, pulmonary, endocrine, musculoskeletal, gastrointestinal, neurological and psychiatric system(s) were reviewed and pertinent findings if present are noted.    Vitals Vital Signs Tempie Donning  Includes: Last 1 Day]  21Apr2014 02:56PM  Blood  Pressure: 116 / 78 Temperature: 98.3 F Heart Rate: 102  Results/Data Urine [Data Includes: Last 1 Day]   21Apr2014  COLOR AMBER   APPEARANCE CLEAR   SPECIFIC GRAVITY 1.025   pH 6.0   GLUCOSE NEG mg/dL  BILIRUBIN NEG   KETONE TRACE mg/dL  BLOOD NEG   PROTEIN NEG mg/dL  UROBILINOGEN 0.2 mg/dL  NITRITE NEG   LEUKOCYTE ESTERASE NEG   Selected Results  UA With REFLEX 21Apr2014 02:27PM Jethro Bolus   Test Name Result Flag Reference  COLOR AMBER A YELLOW  BIOCHEMICALS MAY BE AFFECTED BY THE COLOR OF THE URINE.  APPEARANCE CLEAR  CLEAR  SPECIFIC GRAVITY 1.025  1.005-1.030  pH 6.0  5.0-8.0  GLUCOSE NEG mg/dL  NEG  BILIRUBIN NEG  NEG  KETONE TRACE mg/dL A NEG  BLOOD NEG  NEG  PROTEIN NEG mg/dL  NEG  UROBILINOGEN 0.2 mg/dL  5.6-2.1  NITRITE NEG  NEG  LEUKOCYTE ESTERASE NEG  NEG   Assessment Assessed  1. Distal Ureteral Stone On The Left 592.1   52 yo male with L lower ureteral stone, with CT today-awaiting CT overread. He is having his 25th anniversary in May, and is going on a Church mission to [place] in August. We will await his CT overread, and then plan cysto Left retrograde pyelogram and basket extraction in June.   Plan Health Maintenance (V70.0)  1. UA With REFLEX  Done: 21Apr2014 02:27PM   Plan basket extraction for May/June.   Signatures Electronically signed by : Jethro Bolus, M.D.; Apr 04 2013  3:43PM

## 2013-04-12 ENCOUNTER — Observation Stay (HOSPITAL_COMMUNITY)
Admission: EM | Admit: 2013-04-12 | Discharge: 2013-04-14 | Disposition: A | Discharge status: Home / Self Care | Payer: BC Managed Care – PPO | Attending: Urology | Admitting: Urology

## 2013-04-12 ENCOUNTER — Encounter (HOSPITAL_COMMUNITY): Payer: Self-pay | Admitting: *Deleted

## 2013-04-12 ENCOUNTER — Emergency Department (HOSPITAL_COMMUNITY): Payer: BC Managed Care – PPO

## 2013-04-12 DIAGNOSIS — Z79899 Other long term (current) drug therapy: Secondary | ICD-10-CM | POA: Insufficient documentation

## 2013-04-12 DIAGNOSIS — D72829 Elevated white blood cell count, unspecified: Secondary | ICD-10-CM | POA: Insufficient documentation

## 2013-04-12 DIAGNOSIS — N132 Hydronephrosis with renal and ureteral calculous obstruction: Secondary | ICD-10-CM

## 2013-04-12 DIAGNOSIS — Z87442 Personal history of urinary calculi: Secondary | ICD-10-CM | POA: Insufficient documentation

## 2013-04-12 DIAGNOSIS — I10 Essential (primary) hypertension: Secondary | ICD-10-CM | POA: Insufficient documentation

## 2013-04-12 DIAGNOSIS — R109 Unspecified abdominal pain: Principal | ICD-10-CM | POA: Insufficient documentation

## 2013-04-12 DIAGNOSIS — N1330 Unspecified hydronephrosis: Secondary | ICD-10-CM | POA: Insufficient documentation

## 2013-04-12 DIAGNOSIS — N200 Calculus of kidney: Secondary | ICD-10-CM

## 2013-04-12 LAB — CBC WITH DIFFERENTIAL/PLATELET
Basophils Absolute: 0 10*3/uL (ref 0.0–0.1)
Basophils Relative: 0 % (ref 0–1)
Eosinophils Absolute: 0 10*3/uL (ref 0.0–0.7)
Eosinophils Relative: 0 % (ref 0–5)
HCT: 39.8 % (ref 39.0–52.0)
Hemoglobin: 14.5 g/dL (ref 13.0–17.0)
Lymphocytes Relative: 4 % — ABNORMAL LOW (ref 12–46)
Lymphs Abs: 0.7 10*3/uL (ref 0.7–4.0)
MCH: 30.9 pg (ref 26.0–34.0)
MCHC: 36.4 g/dL — ABNORMAL HIGH (ref 30.0–36.0)
MCV: 84.7 fL (ref 78.0–100.0)
Monocytes Absolute: 0.9 10*3/uL (ref 0.1–1.0)
Monocytes Relative: 5 % (ref 3–12)
Neutro Abs: 17.6 10*3/uL — ABNORMAL HIGH (ref 1.7–7.7)
Neutrophils Relative %: 92 % — ABNORMAL HIGH (ref 43–77)
Platelets: 284 10*3/uL (ref 150–400)
RBC: 4.7 MIL/uL (ref 4.22–5.81)
RDW: 11.7 % (ref 11.5–15.5)
WBC: 19.2 10*3/uL — ABNORMAL HIGH (ref 4.0–10.5)

## 2013-04-12 LAB — COMPREHENSIVE METABOLIC PANEL
ALT: 19 U/L (ref 0–53)
AST: 16 U/L (ref 0–37)
Albumin: 4.3 g/dL (ref 3.5–5.2)
Alkaline Phosphatase: 47 U/L (ref 39–117)
BUN: 20 mg/dL (ref 6–23)
CO2: 25 mEq/L (ref 19–32)
Calcium: 9 mg/dL (ref 8.4–10.5)
Chloride: 93 mEq/L — ABNORMAL LOW (ref 96–112)
Creatinine, Ser: 1.43 mg/dL — ABNORMAL HIGH (ref 0.50–1.35)
GFR calc Af Amer: 63 mL/min — ABNORMAL LOW (ref >90)
GFR calc non Af Amer: 55 mL/min — ABNORMAL LOW (ref >90)
Glucose, Bld: 150 mg/dL — ABNORMAL HIGH (ref 70–99)
Potassium: 4.6 mEq/L (ref 3.5–5.1)
Sodium: 130 mEq/L — ABNORMAL LOW (ref 135–145)
Total Bilirubin: 0.6 mg/dL (ref 0.3–1.2)
Total Protein: 7.4 g/dL (ref 6.0–8.3)

## 2013-04-12 LAB — URINALYSIS, ROUTINE W REFLEX MICROSCOPIC
Bilirubin Urine: NEGATIVE
Glucose, UA: NEGATIVE mg/dL
Ketones, ur: NEGATIVE mg/dL
Leukocytes, UA: NEGATIVE
Nitrite: NEGATIVE
Protein, ur: 30 mg/dL — AB
Specific Gravity, Urine: 1.031 — ABNORMAL HIGH (ref 1.005–1.030)
Urobilinogen, UA: 0.2 mg/dL (ref 0.0–1.0)
pH: 6 (ref 5.0–8.0)

## 2013-04-12 LAB — BASIC METABOLIC PANEL
BUN: 23 mg/dL (ref 6–23)
CO2: 25 mEq/L (ref 19–32)
Calcium: 8.5 mg/dL (ref 8.4–10.5)
Chloride: 100 mEq/L (ref 96–112)
Creatinine, Ser: 1.61 mg/dL — ABNORMAL HIGH (ref 0.50–1.35)
GFR calc Af Amer: 55 mL/min — ABNORMAL LOW (ref >90)
GFR calc non Af Amer: 47 mL/min — ABNORMAL LOW (ref >90)
Glucose, Bld: 100 mg/dL — ABNORMAL HIGH (ref 70–99)
Potassium: 4.6 mEq/L (ref 3.5–5.1)
Sodium: 136 mEq/L (ref 135–145)

## 2013-04-12 LAB — URINE MICROSCOPIC-ADD ON

## 2013-04-12 MED ORDER — DEXTROSE 5 % IV SOLN
1.0000 g | Freq: Once | INTRAVENOUS | Status: AC
  Administered 2013-04-12: 1 g via INTRAVENOUS
  Filled 2013-04-12: qty 10

## 2013-04-12 MED ORDER — KETOROLAC TROMETHAMINE 30 MG/ML IJ SOLN
30.0000 mg | Freq: Once | INTRAMUSCULAR | Status: AC
  Administered 2013-04-12: 30 mg via INTRAVENOUS
  Filled 2013-04-12: qty 1

## 2013-04-12 MED ORDER — SODIUM CHLORIDE 0.9 % IV BOLUS (SEPSIS)
1000.0000 mL | Freq: Once | INTRAVENOUS | Status: AC
  Administered 2013-04-12: 1000 mL via INTRAVENOUS

## 2013-04-12 MED ORDER — ONDANSETRON HCL 4 MG/2ML IJ SOLN
4.0000 mg | Freq: Four times a day (QID) | INTRAMUSCULAR | Status: DC | PRN
  Administered 2013-04-12 – 2013-04-13 (×2): 4 mg via INTRAVENOUS
  Filled 2013-04-12 (×3): qty 2

## 2013-04-12 MED ORDER — SODIUM CHLORIDE 0.45 % IV SOLN
INTRAVENOUS | Status: DC
  Administered 2013-04-12 – 2013-04-14 (×5): via INTRAVENOUS

## 2013-04-12 MED ORDER — FENTANYL CITRATE 0.05 MG/ML IJ SOLN
50.0000 ug | Freq: Once | INTRAMUSCULAR | Status: AC
  Administered 2013-04-12: 50 ug via INTRAVENOUS
  Filled 2013-04-12: qty 2

## 2013-04-12 MED ORDER — ONDANSETRON HCL 4 MG/2ML IJ SOLN
4.0000 mg | Freq: Once | INTRAMUSCULAR | Status: AC
  Administered 2013-04-12: 4 mg via INTRAVENOUS
  Filled 2013-04-12: qty 2

## 2013-04-12 MED ORDER — ACETAMINOPHEN 325 MG PO TABS: 650.0000 mg | ORAL_TABLET | ORAL | Status: DC | PRN

## 2013-04-12 MED ORDER — KETOROLAC TROMETHAMINE 15 MG/ML IJ SOLN
15.0000 mg | Freq: Four times a day (QID) | INTRAMUSCULAR | Status: DC
  Administered 2013-04-12 – 2013-04-14 (×8): 15 mg via INTRAVENOUS
  Filled 2013-04-12 (×18): qty 1

## 2013-04-12 MED ORDER — HYDROMORPHONE HCL PF 1 MG/ML IJ SOLN
1.0000 mg | Freq: Once | INTRAMUSCULAR | Status: AC
  Administered 2013-04-12: 1 mg via INTRAVENOUS
  Filled 2013-04-12: qty 1

## 2013-04-12 MED ORDER — SODIUM CHLORIDE 0.9 % IV SOLN
Freq: Once | INTRAVENOUS | Status: AC
  Administered 2013-04-12: 08:00:00 via INTRAVENOUS

## 2013-04-12 MED ORDER — HYDROMORPHONE HCL PF 1 MG/ML IJ SOLN
0.5000 mg | INTRAMUSCULAR | Status: DC | PRN
  Administered 2013-04-12 – 2013-04-13 (×5): 1 mg via INTRAVENOUS
  Filled 2013-04-12 (×6): qty 1

## 2013-04-12 MED ORDER — OXYBUTYNIN CHLORIDE 5 MG PO TABS
5.0000 mg | ORAL_TABLET | Freq: Three times a day (TID) | ORAL | Status: DC | PRN
  Administered 2013-04-12 – 2013-04-13 (×2): 5 mg via ORAL
  Filled 2013-04-12 (×3): qty 1

## 2013-04-12 MED ORDER — ONDANSETRON HCL 4 MG/2ML IJ SOLN
4.0000 mg | Freq: Once | INTRAMUSCULAR | Status: AC
  Administered 2013-04-12: 4 mg via INTRAVENOUS
  Filled 2013-04-12 (×2): qty 2

## 2013-04-12 MED ORDER — OXYCODONE-ACETAMINOPHEN 5-325 MG PO TABS
1.0000 | ORAL_TABLET | ORAL | Status: DC | PRN
  Administered 2013-04-13 – 2013-04-14 (×3): 2 via ORAL
  Filled 2013-04-12 (×4): qty 2

## 2013-04-12 NOTE — ED Notes (Signed)
Patient transported to CT 

## 2013-04-12 NOTE — ED Notes (Signed)
Pt states he had a kidney stone removed 4/28 at [facility]; pt states that he has had increased pain, nausea and vomiting this pm and has continued to have blood with urination; pt states that the pain medication prescribed this am after surgery is not able to control pain.

## 2013-04-12 NOTE — H&P (Signed)
Shawn [name] is an 52 y.o. male.    Chief Complaint: Left Flank Pain After Ureteroscopy  HPI:  1 - Left Flank Pain and Hydronephrosis After Ureteroscopic Stone Manipulation - pt POD 1 s/p left ureteroscopic stone manipulation for distal left ureteral stone on 04/11/13 which was uncomplicated. He presented to ER this AM with increased left flank pain and refractory nausea / vomitting. CT demonstrates let hydro without residual stone fragments. Cr 1.43, WBC 19k. No fevers.   Past Medical History  Diagnosis Date  . Kidney stone   . Hypertension   . Blind right eye     accident age 59    Past Surgical History  Procedure Laterality Date  . Appendectomy    . Right retina detachment    . Cystoscopy w/ ureteral stent placement  08/24/2012    Procedure: CYSTOSCOPY WITH RETROGRADE PYELOGRAM/URETERAL STENT PLACEMENT;  Surgeon: Kathi Ludwig, MD;  Location: [facility];  Service: Urology;  Laterality: Right;  . Cystoscopy/retrograde/ureteroscopy  08/24/2012    Procedure: CYSTOSCOPY/RETROGRADE/URETEROSCOPY;  Surgeon: Kathi Ludwig, MD;  Location: [facility];  Service: Urology;  Laterality: Right;  Stone Basketing  . Cystoscopy/retrograde/ureteroscopy/stone extraction with basket Left 04/11/2013    Procedure: CYSTOSCOPY/RETROGRADE/URETEROSCOPY/STONE EXTRACTION WITH BASKET;  Surgeon: Kathi Ludwig, MD;  Location: [facility];  Service: Urology;  Laterality: Left;  BASKET STONE EXTRACTION WITH URETEROSCOPE      History reviewed. No pertinent family history. Social History:  reports that he has never smoked. He has never used smokeless tobacco. He reports that he does not drink alcohol or use illicit drugs.  Allergies: No Known Allergies  Medications Prior to Admission  Medication Sig Dispense Refill  . cephALEXin (KEFLEX) 500 MG capsule Take 1 capsule (500 mg total) by mouth 2 (two) times daily.  10 capsule  0  . HYDROcodone-acetaminophen (NORCO/VICODIN) 5-325 MG per  tablet Take 1 tablet by mouth every 6 (six) hours as needed for pain.  15 tablet  0  . URELLE (URELLE/URISED) 81 MG TABS Take 1 tablet (81 mg total) by mouth 3 (three) times daily.  30 each  2  . lisinopril (PRINIVIL,ZESTRIL) 10 MG tablet Take 10 mg by mouth daily.      . metoprolol (LOPRESSOR) 50 MG tablet Take 50 mg by mouth 2 (two) times daily.      . ondansetron (ZOFRAN ODT) 8 MG disintegrating tablet Take 1 tablet (8 mg total) by mouth every 8 (eight) hours as needed for nausea.  20 tablet  0  . oxyCODONE-acetaminophen (ROXICET) 5-325 MG per tablet Take 1 tablet by mouth every 4 (four) hours as needed for pain.  30 tablet  0  . tamsulosin (FLOMAX) 0.4 MG CAPS Take 1 capsule (0.4 mg total) by mouth daily.  10 capsule  0    Results for orders placed during the hospital encounter of 04/12/13 (from the past 48 hour(s))  CBC WITH DIFFERENTIAL     Status: Abnormal   Collection Time    04/12/13  3:00 AM      Result Value Range   WBC 19.2 (*) 4.0 - 10.5 K/uL   RBC 4.70  4.22 - 5.81 MIL/uL   Hemoglobin 14.5  13.0 - 17.0 g/dL   HCT 16.1  09.6 - 04.5 %   MCV 84.7  78.0 - 100.0 fL   MCH 30.9  26.0 - 34.0 pg   MCHC 36.4 (*) 30.0 - 36.0 g/dL   RDW 40.9  81.1 - 91.4 %   Platelets 284  150 - 400 K/uL   Neutrophils Relative 92 (*) 43 - 77 %   Neutro Abs 17.6 (*) 1.7 - 7.7 K/uL   Lymphocytes Relative 4 (*) 12 - 46 %   Lymphs Abs 0.7  0.7 - 4.0 K/uL   Monocytes Relative 5  3 - 12 %   Monocytes Absolute 0.9  0.1 - 1.0 K/uL   Eosinophils Relative 0  0 - 5 %   Eosinophils Absolute 0.0  0.0 - 0.7 K/uL   Basophils Relative 0  0 - 1 %   Basophils Absolute 0.0  0.0 - 0.1 K/uL  COMPREHENSIVE METABOLIC PANEL     Status: Abnormal   Collection Time    04/12/13  3:00 AM      Result Value Range   Sodium 130 (*) 135 - 145 mEq/L   Comment: DELTA CHECK NOTED     REPEATED TO VERIFY   Potassium 4.6  3.5 - 5.1 mEq/L   Chloride 93 (*) 96 - 112 mEq/L   Comment: DELTA CHECK NOTED     REPEATED TO VERIFY   CO2  25  19 - 32 mEq/L   Glucose, Bld 150 (*) 70 - 99 mg/dL   BUN 20  6 - 23 mg/dL   Creatinine, Ser 1.61 (*) 0.50 - 1.35 mg/dL   Calcium 9.0  8.4 - 09.6 mg/dL   Total Protein 7.4  6.0 - 8.3 g/dL   Albumin 4.3  3.5 - 5.2 g/dL   AST 16  0 - 37 U/L   ALT 19  0 - 53 U/L   Alkaline Phosphatase 47  39 - 117 U/L   Total Bilirubin 0.6  0.3 - 1.2 mg/dL   GFR calc non Af Amer 55 (*) >90 mL/min   GFR calc Af Amer 63 (*) >90 mL/min   Comment:            The eGFR has been calculated     using the CKD EPI equation.     This calculation has not been     validated in all clinical     situations.     eGFR's persistently     <90 mL/min signify     possible Chronic Kidney Disease.  URINALYSIS, ROUTINE W REFLEX MICROSCOPIC     Status: Abnormal   Collection Time    04/12/13  4:44 AM      Result Value Range   Color, Urine GREEN (*) YELLOW   Comment: BIOCHEMICALS MAY BE AFFECTED BY COLOR   APPearance CLOUDY (*) CLEAR   Specific Gravity, Urine 1.031 (*) 1.005 - 1.030   pH 6.0  5.0 - 8.0   Glucose, UA NEGATIVE  NEGATIVE mg/dL   Hgb urine dipstick LARGE (*) NEGATIVE   Bilirubin Urine NEGATIVE  NEGATIVE   Ketones, ur NEGATIVE  NEGATIVE mg/dL   Protein, ur 30 (*) NEGATIVE mg/dL   Urobilinogen, UA 0.2  0.0 - 1.0 mg/dL   Nitrite NEGATIVE  NEGATIVE   Leukocytes, UA NEGATIVE  NEGATIVE  URINE MICROSCOPIC-ADD ON     Status: Abnormal   Collection Time    04/12/13  4:44 AM      Result Value Range   Squamous Epithelial / LPF RARE  RARE   WBC, UA 0-2  <3 WBC/hpf   RBC / HPF 21-50  <3 RBC/hpf   Bacteria, UA FEW (*) RARE   Ct Abdomen Pelvis Wo Contrast  04/12/2013  *RADIOLOGY REPORT*  Clinical Data: Left lower quadrant pain  CT ABDOMEN  AND PELVIS WITHOUT CONTRAST  Technique:  Multidetector CT imaging of the abdomen and pelvis was performed following the standard protocol without intravenous contrast.  Comparison: The CT 04/04/2013  Findings:  Renal:  There is interval increase in hydronephrosis and hydroureter  on the left compared to CT of 04/04/2013.  The calculus previously seen in the distal left ureter is no longer identified. There is perinephric stranding surrounding the right kidney which is also new from prior.  The right kidney is normal.  No evidence of right ureterolithiasis.  There is small amount of gas within the bladder.  This may relate to recent instrumentation.  There is no stone evident within the bladder.  Lung bases are clear.  There is mild basilar linear atelectasis. There is no focal hepatic lesion this noncontrast exam.  The gallbladder, pancreas, spleen, adrenal glands are normal.  The stomach, small bowel, and colon are normal.  Abdominal aorta normal caliber.  No retroperitoneal periportal lymphadenopathy.  No free fluid the pelvis.  Prostate gland bladder normal.  IMPRESSION:  1.  Interval increase in hydronephrosis, hydroureter, and perinephric fluid on the left compared to prior.  2.  Interval clearing of the distal left ureteral calculus.  Small amount gas in the bladder suggests instrumentation.   Original Report Authenticated By: Genevive Bi, M.D.     Review of Systems  Constitutional: Negative.  Negative for fever and chills.  HENT: Negative.   Eyes: Negative.   Respiratory: Negative.   Cardiovascular: Negative.   Gastrointestinal: Positive for nausea and vomiting.  Genitourinary: Positive for hematuria and flank pain.  Musculoskeletal: Negative.   Skin: Negative.   Neurological: Negative.   Endo/Heme/Allergies: Negative.   Psychiatric/Behavioral: Negative.     Blood pressure 135/83, pulse 82, temperature 98.5 F (36.9 C), temperature source Oral, resp. rate 18, height 6\' 1"  (1.854 m), weight 98.431 kg (217 lb), SpO2 99.00%. Physical Exam  Constitutional: He is oriented to person, place, and time. He appears well-developed and well-nourished.  HENT:  Head: Normocephalic and atraumatic.  Eyes: EOM are normal. Pupils are equal, round, and reactive to light.   Neck: Normal range of motion. Neck supple.  Cardiovascular: Normal rate and regular rhythm.   Respiratory: Effort normal and breath sounds normal.  GI: Soft. Bowel sounds are normal.  Moderate Left CVAT  Genitourinary: Penis normal.  Musculoskeletal: Normal range of motion.  Neurological: He is alert and oriented to person, place, and time.  Skin: Skin is warm and dry.  Psychiatric: He has a normal mood and affect. His behavior is normal. Judgment and thought content normal.     Assessment/Plan  1 - Left Flank Pain and Hydronephrosis After Ureteroscopic Stone Manipulation - Likely ureteral edema leading to renal colic symptoms. Leukocytosis likely reactive from pain as no fevers. Discussed OR for stent v. Admit for observation and pain control, as hydro and edema should resolve without intervention. He has opted for observation admission, this is reasonable. NPO for now and re-assess in PM. If improving will remain non-operative intervention.  Will obtain AM BMP, CBC to verify Cr and WBC trend down.   Querida Beretta 04/12/2013, 4:24 PM

## 2013-04-12 NOTE — ED Notes (Addendum)
Pt transported to 1415 accompanied by Sharon Seller, NT with chart, meds and personal belongings, condition stable at time of transfer.

## 2013-04-12 NOTE — Care Management Note (Addendum)
    Page 1 of 1   04/14/2013     11:28:14 AM   CARE MANAGEMENT NOTE 04/14/2013  Patient:  ZIERE, DOCKEN   Account Number:  [id number]  Date Initiated:  04/12/2013  Documentation initiated by:  Lanier Clam  Subjective/Objective Assessment:   ADMITTED W/L URETERAL STONE.     Action/Plan:   FROM HOME W/SPOUSE.HAS PCP,PHARMACY.   Anticipated DC Date:  04/14/2013   Anticipated DC Plan:  HOME/SELF CARE      DC Planning Services  CM consult      Choice offered to / List presented to:             Status of service:  Completed, signed off Medicare Important Message given?   (If response is "NO", the following Medicare IM given date fields will be blank) Date Medicare IM given:   Date Additional Medicare IM given:    Discharge Disposition:  HOME/SELF CARE  Per UR Regulation:  Reviewed for med. necessity/level of care/duration of stay  If discussed at Long Length of Stay Meetings, dates discussed:    Comments:  04/14/13 Cecile Gillispie RN,BSN NCM [phone] D/C HOME NO ORDERS OR NEEDS.  04/12/13 Chereese Cilento RN,BSN NCM [phone] POD#1 CYSTOURETHROSCOPY.

## 2013-04-12 NOTE — ED Provider Notes (Signed)
History     CSN: [id number]  Arrival date & time 04/12/13  0142   First MD Initiated Contact with Patient 04/12/13 0701      Chief Complaint  Patient presents with  . Postoperative Pain     (Consider location/radiation/quality/duration/timing/severity/associated sxs/prior treatment) HPI Patient s/p basket retrieval of left ureteral stone yesterday by Dr. Patsi Sears with pain increasing through afternoon.  Took 4 oxycodone q2 hours.  No pain control- Pain worsened about 6 pm.  Pain worsened to 10.  Pain sudden and severe like with kidney stone.  Nausea, some emesisi, only dry heaves here.  No fever, chills.  Patient making urine- blue.   Past Medical History  Diagnosis Date  . Kidney stone   . Hypertension   . Blind right eye     accident age 62    Past Surgical History  Procedure Laterality Date  . Appendectomy    . Right retina detachment    . Cystoscopy w/ ureteral stent placement  08/24/2012    Procedure: CYSTOSCOPY WITH RETROGRADE PYELOGRAM/URETERAL STENT PLACEMENT;  Surgeon: Kathi Ludwig, MD;  Location: [facility];  Service: Urology;  Laterality: Right;  . Cystoscopy/retrograde/ureteroscopy  08/24/2012    Procedure: CYSTOSCOPY/RETROGRADE/URETEROSCOPY;  Surgeon: Kathi Ludwig, MD;  Location: [facility];  Service: Urology;  Laterality: Right;  Stone Basketing    No family history on file.  History  Substance Use Topics  . Smoking status: Never Smoker   . Smokeless tobacco: Never Used  . Alcohol Use: No      Review of Systems  All other systems reviewed and are negative.    Allergies  Review of patient's allergies indicates no known allergies.  Home Medications   Current Outpatient Rx  Name  Route  Sig  Dispense  Refill  . cephALEXin (KEFLEX) 500 MG capsule   Oral   Take 1 capsule (500 mg total) by mouth 2 (two) times daily.   10 capsule   0   . HYDROcodone-acetaminophen (NORCO/VICODIN) 5-325 MG per tablet   Oral   Take 1 tablet by mouth every 6  (six) hours as needed for pain.   15 tablet   0   . URELLE (URELLE/URISED) 81 MG TABS   Oral   Take 1 tablet (81 mg total) by mouth 3 (three) times daily.   30 each   2   . lisinopril (PRINIVIL,ZESTRIL) 10 MG tablet   Oral   Take 10 mg by mouth daily.         . metoprolol (LOPRESSOR) 50 MG tablet   Oral   Take 50 mg by mouth 2 (two) times daily.         . ondansetron (ZOFRAN ODT) 8 MG disintegrating tablet   Oral   Take 1 tablet (8 mg total) by mouth every 8 (eight) hours as needed for nausea.   20 tablet   0   . oxyCODONE-acetaminophen (ROXICET) 5-325 MG per tablet   Oral   Take 1 tablet by mouth every 4 (four) hours as needed for pain.   30 tablet   0   . tamsulosin (FLOMAX) 0.4 MG CAPS   Oral   Take 1 capsule (0.4 mg total) by mouth daily.   10 capsule   0     BP 130/86  Pulse 85  Temp(Src) 98.8 F (37.1 C) (Oral)  Resp 20  SpO2 96%  Physical Exam  Nursing note and vitals reviewed. Constitutional: He is oriented to person, place, and time. He appears  well-developed and well-nourished.  HENT:  Head: Normocephalic and atraumatic.  Eyes: Conjunctivae are normal.  Neck: Normal range of motion. Neck supple.  Cardiovascular: Normal rate and regular rhythm.   Pulmonary/Chest: Effort normal and breath sounds normal.  Abdominal: Soft. Bowel sounds are normal.  Musculoskeletal: Normal range of motion.  Neurological: He is alert and oriented to person, place, and time.  Skin: Skin is warm and dry.  Psychiatric: He has a normal mood and affect.    ED Course  Procedures (including critical care time)  Labs Reviewed  URINALYSIS, ROUTINE W REFLEX MICROSCOPIC - Abnormal; Notable for the following:    Color, Urine GREEN (*)    APPearance CLOUDY (*)    Specific Gravity, Urine 1.031 (*)    Hgb urine dipstick LARGE (*)    Protein, ur 30 (*)    All other components within normal limits  CBC WITH DIFFERENTIAL - Abnormal; Notable for the following:    WBC 19.2  (*)    MCHC 36.4 (*)    Neutrophils Relative 92 (*)    Neutro Abs 17.6 (*)    Lymphocytes Relative 4 (*)    All other components within normal limits  COMPREHENSIVE METABOLIC PANEL - Abnormal; Notable for the following:    Sodium 130 (*)    Chloride 93 (*)    Glucose, Bld 150 (*)    Creatinine, Ser 1.43 (*)    GFR calc non Af Amer 55 (*)    GFR calc Af Amer 63 (*)    All other components within normal limits  URINE MICROSCOPIC-ADD ON - Abnormal; Notable for the following:    Bacteria, UA FEW (*)    All other components within normal limits   No results found.   No diagnosis found.    MDM  Discussed with Dr.Manny and advised ct scan which reveals hydronephrosis of the left kidney and ureter.  Dr. [name] will see and assess.   Dr. [name] in to see and plans admission.        Hilario Quarry, MD 04/12/13 1011

## 2013-04-13 ENCOUNTER — Observation Stay (HOSPITAL_COMMUNITY): Payer: BC Managed Care – PPO

## 2013-04-13 LAB — URINE CULTURE
Colony Count: NO GROWTH
Culture: NO GROWTH
Special Requests: NORMAL

## 2013-04-13 LAB — BASIC METABOLIC PANEL
BUN: 26 mg/dL — ABNORMAL HIGH (ref 6–23)
CO2: 25 mEq/L (ref 19–32)
Calcium: 8.1 mg/dL — ABNORMAL LOW (ref 8.4–10.5)
Chloride: 101 mEq/L (ref 96–112)
Creatinine, Ser: 1.69 mg/dL — ABNORMAL HIGH (ref 0.50–1.35)
GFR calc Af Amer: 52 mL/min — ABNORMAL LOW (ref >90)
GFR calc non Af Amer: 45 mL/min — ABNORMAL LOW (ref >90)
Glucose, Bld: 116 mg/dL — ABNORMAL HIGH (ref 70–99)
Potassium: 4.4 mEq/L (ref 3.5–5.1)
Sodium: 133 mEq/L — ABNORMAL LOW (ref 135–145)

## 2013-04-13 LAB — CBC
HCT: 33.5 % — ABNORMAL LOW (ref 39.0–52.0)
Hemoglobin: 11.4 g/dL — ABNORMAL LOW (ref 13.0–17.0)
MCH: 29.5 pg (ref 26.0–34.0)
MCHC: 34 g/dL (ref 30.0–36.0)
MCV: 86.8 fL (ref 78.0–100.0)
Platelets: 199 10*3/uL (ref 150–400)
RBC: 3.86 MIL/uL — ABNORMAL LOW (ref 4.22–5.81)
RDW: 12.3 % (ref 11.5–15.5)
WBC: 15.9 10*3/uL — ABNORMAL HIGH (ref 4.0–10.5)

## 2013-04-13 MED ORDER — BISACODYL 10 MG RE SUPP: 10.0000 mg | Freq: Every day | RECTAL | Status: DC | PRN

## 2013-04-13 MED ORDER — CIPROFLOXACIN IN D5W 400 MG/200ML IV SOLN
400.0000 mg | Freq: Two times a day (BID) | INTRAVENOUS | Status: AC
  Administered 2013-04-13 (×2): 400 mg via INTRAVENOUS
  Filled 2013-04-13 (×3): qty 200

## 2013-04-13 MED ORDER — BISACODYL 5 MG PO TBEC
10.0000 mg | DELAYED_RELEASE_TABLET | Freq: Every day | ORAL | Status: DC | PRN
  Administered 2013-04-13 – 2013-04-14 (×2): 10 mg via ORAL
  Filled 2013-04-13 (×2): qty 2

## 2013-04-13 NOTE — Progress Notes (Signed)
Urology Progress Note : pt with improved u/s today, but still has abdominal discomfort, probably 2ndary to constipation.   Subjective:     No acute urologic events overnight. Ambulation:   positive Flatus:    negative Bowel movement  negative  Pain: some relief  Objective:  Blood pressure 121/69, pulse 101, temperature 98.4 F (36.9 C), temperature source Oral, resp. rate 16, height 6\' 1"  (1.854 m), weight 98.431 kg (217 lb), SpO2 97.00%.  Physical Exam:  General:  No acute distress, awake Resp: clear to auscultation bilaterally Genitourinary:  Abd: benign: + BS. Mild pain in L flank.  Foley:  none    I/O last 3 completed shifts: In: 3761.3 [P.O.:480; I.V.:3281.3] Out: 2725 [Urine:2725]  Recent Labs     04/12/13  0300  04/13/13  0440  HGB  14.5  11.4*  WBC  19.2*  15.9*  PLT  284  199    Recent Labs     04/12/13  1644  04/13/13  0440  NA  136  133*  K  4.6  4.4  CL  100  101  CO2  25  25  BUN  23  26*  CREATININE  1.61*  1.69*  CALCIUM  8.5  8.1*  GFRNONAA  47*  45*  GFRAA  55*  52*     No results found for this basename: PT, INR, APTT,  in the last 72 hours   No components found with this basename: ABG,   Assessment/Plan:  Continue any current medications. Tap water enema Dulcolax D/c planning for AM.

## 2013-04-13 NOTE — Progress Notes (Signed)
Patient anxious, requesting that I page Dr. Patsi Sears to find out whether or not he is to have a stent placed today.  Patient is also concerned because his last bowel movement was last Sunday and he states he is not passing any gas.  He was positive for bowel sounds upon assessment this morning.  Will encourage the patient to ambulate, and also received orders to put patient on regular diet and for dulcolax PRN.  Will continue to monitor.

## 2013-04-13 NOTE — Progress Notes (Signed)
Urology Progress Note    Subjective: 1 - Left Flank Pain and Hydronephrosis After Ureteroscopic Stone Manipulation - pt POD 1 s/p left ureteroscopic stone manipulation for distal left ureteral stone on 04/11/13 which was uncomplicated. He presented to ER , with increased left flank pain and refractory nausea / vomitting. CT demonstrates let hydro without residual stone fragments. Cr 1.43, WBC 19k, decreasing to 15l. No fevers. Pain controlled with alternating toradol and IV dilauded.  Past Medical History   Diagnosis  Date   .  Kidney stone    .  Hypertension    .  Blind right eye      accident age 52     Meds: Rocephin 1 gram yesterday.    No acute urologic events overnight. Ambulation:   negative Flatus:    negative Bowel movement  negative  Pain: some relief  Objective:  Blood pressure 125/85, pulse 79, temperature 98.2 F (36.8 C), temperature source Oral, resp. rate 18, height 6\' 1"  (1.854 m), weight 98.431 kg (217 lb), SpO2 94.00%.  Physical Exam:  General:  No acute distress, awake Abd: Soft. decreased BS. mild tenderness LUQ and L flank.  Genitourinary:  Normal testes and penis Foley: none    I/O last 3 completed shifts: In: 3761.3 [P.O.:480; I.V.:3281.3] Out: 2725 [Urine:2725]  Recent Labs     04/12/13  0300  04/13/13  0440  HGB  14.5  11.4*  WBC  19.2*  15.9*  PLT  284  199    Recent Labs     04/12/13  1644  04/13/13  0440  NA  136  133*  K  4.6  4.4  CL  100  101  CO2  25  25  BUN  23  26*  CREATININE  1.61*  1.69*  CALCIUM  8.5  8.1*  GFRNONAA  47*  45*  GFRAA  55*  52*       Assessment/Plan: L flank pain continues, although somewhat improved. He had hydro yesterday post basket extraction of stone. Will u/s today to see if he has significant hydro as compared to his CT. ? Need for stent.    current medications:.  1. Cipro coverage 2. Continue NPO for now pending u/s evaluation.

## 2013-04-14 MED ORDER — TAMSULOSIN HCL 0.4 MG PO CAPS: 0.4000 mg | ORAL_CAPSULE | Freq: Every day | ORAL | Status: DC

## 2013-04-14 MED ORDER — TRAMADOL-ACETAMINOPHEN 37.5-325 MG PO TABS: 1.0000 | ORAL_TABLET | Freq: Four times a day (QID) | ORAL | Status: DC | PRN

## 2013-04-14 MED ORDER — CEPHALEXIN 500 MG PO CAPS: 500.0000 mg | ORAL_CAPSULE | Freq: Two times a day (BID) | ORAL | Status: DC

## 2013-04-14 NOTE — Progress Notes (Signed)
Patient is a 52 yr old male POD # 3 s/p ureteroscopy left UVJ stone extraction w/ a post op complicated course of left flank pain,  left hydronephrosis,  and refractory nausea was discharged earlier today.  Received call from wife this evening regarding patient w/ fever of 101 F but asymptomatic otherwise.  Suggested that patient takes tylenol, increase hydration, and continue his course of cephalexin.  Asked that wife calls me back within 2 hours to report his progress.  If fever remains elevated after Tylenol, then she should bring him to the ER.  Wife verbalized understanding.

## 2013-04-14 NOTE — Progress Notes (Signed)
Pt tolerated tap water enema well.  Pt reported having one medium sized BM after the enema.  Pt reports some relief now. Will continue to monitor pt.

## 2013-04-14 NOTE — Progress Notes (Signed)
Urology Progress Note  : continued LUQ pain: + BM after enema: small.    Subjective: constipation post p[ain med.      No acute urologic events overnight. Ambulation:   positive Flatus:    positive Bowel movement  positive  Pain: some relief  Objective:  Blood pressure 123/80, pulse 81, temperature 99.1 F (37.3 C), temperature source Oral, resp. rate 19, height 6\' 1"  (1.854 m), weight 98.431 kg (217 lb), SpO2 97.00%.  Physical Exam:  General:  No acute distress, awake Abd: L:UQ pain to palpation: no rebound. + BS Genitourinary:   Normal  Foley: none    I/O last 3 completed shifts: In: 5667.9 [P.O.:820; I.V.:4447.9; IV Piggyback:400] Out: 4655 [Urine:4655]  Recent Labs     04/12/13  0300  04/13/13  0440  HGB  14.5  11.4*  WBC  19.2*  15.9*  PLT  284  199    Recent Labs     04/12/13  1644  04/13/13  0440  NA  136  133*  K  4.6  4.4  CL  100  101  CO2  25  25  BUN  23  26*  CREATININE  1.61*  1.69*  CALCIUM  8.5  8.1*  GFRNONAA  47*  45*  GFRAA  55*  52*     No results found for this basename: PT, INR, APTT,  in the last 72 hours   No components found with this basename: ABG,   Assessment/Plan:  D/c today Citrate of Mg at home, walk. Tramadol for pain.

## 2013-04-14 NOTE — Discharge Summary (Signed)
Physician Discharge Summary  Patient ID: Shawn [name] MRN: [id number] DOB/AGE: 1961-03-23 52 y.o.  Admit date: 04/12/2013 Discharge date: 04/14/2013  Admission Diagnoses: flank pain and hydronephrosis post basket extraction of left U-V junction stone  Discharge Diagnoses:  Active Problems:   * No active hospital problems. *   Discharged Condition: good  Hospital Course:   Pain control, constipation control  Consults: None  Significant Diagnostic Studies: radiology:CT/u/s  Treatments: IV hydration, antibiotics: Ancef and analgesia: Dilaudid  Discharge Exam: Blood pressure 123/80, pulse 81, temperature 99.1 F (37.3 C), temperature source Oral, resp. rate 19, height 6\' 1"  (1.854 m), weight 98.431 kg (217 lb), SpO2 97.00%. General appearance: alert and cooperative GI: soft, non-tender; bowel sounds normal; no masses,  no organomegaly. LUQ stil some tenderness.   Disposition: 01-Home or Self Care. Rx constipation with cathartics at home.  Follow-up in offic for ultrasound to document complete resolution of hydronephrosis.   Discharge Orders   Future Orders Complete By Expires     Call MD for:  persistant nausea and vomiting  As directed     Call MD for:  severe uncontrolled pain  As directed     Call MD for:  As directed     Diet - low sodium heart healthy  As directed     Discharge instructions  As directed     Comments:      Walk. Citrate of Magnesia for constipation ( otc).    Discharge patient  As directed     Discontinue IV  As directed     Increase activity slowly  As directed         Medication List    TAKE these medications       cephALEXin 500 MG capsule  Commonly known as:  KEFLEX  Take 1 capsule (500 mg total) by mouth 2 (two) times daily.     cephALEXin 500 MG capsule  Commonly known as:  KEFLEX  Take 1 capsule (500 mg total) by mouth 2 (two) times daily.     HYDROcodone-acetaminophen 5-325 MG per tablet  Commonly known as:  NORCO/VICODIN  Take 1  tablet by mouth every 6 (six) hours as needed for pain.     lisinopril 10 MG tablet  Commonly known as:  PRINIVIL,ZESTRIL  Take 10 mg by mouth daily.     metoprolol 50 MG tablet  Commonly known as:  LOPRESSOR  Take 50 mg by mouth 2 (two) times daily.     ondansetron 8 MG disintegrating tablet  Commonly known as:  ZOFRAN ODT  Take 1 tablet (8 mg total) by mouth every 8 (eight) hours as needed for nausea.     oxyCODONE-acetaminophen 5-325 MG per tablet  Commonly known as:  ROXICET  Take 1 tablet by mouth every 4 (four) hours as needed for pain.     tamsulosin 0.4 MG Caps  Commonly known as:  FLOMAX  Take 1 capsule (0.4 mg total) by mouth daily.     tamsulosin 0.4 MG Caps  Commonly known as:  FLOMAX  Take 1 capsule (0.4 mg total) by mouth daily.     traMADol-acetaminophen 37.5-325 MG per tablet  Commonly known as:  ULTRACET  Take 1 tablet by mouth every 6 (six) hours as needed for pain.     URELLE 81 MG Tabs  Take 1 tablet (81 mg total) by mouth 3 (three) times daily.           Follow-up Information   Follow up with Artina Minella  I, MD In 1 week.   Contact information:   [address] [place] [address] [phone]       Signed: Jethro Bolus I 04/14/2013, 10:49 AM

## 2014-01-19 ENCOUNTER — Encounter: Payer: Self-pay | Admitting: General Surgery

## 2014-09-27 ENCOUNTER — Ambulatory Visit (INDEPENDENT_AMBULATORY_CARE_PROVIDER_SITE_OTHER): Payer: 59 | Admitting: Family Medicine

## 2014-09-27 ENCOUNTER — Ambulatory Visit (INDEPENDENT_AMBULATORY_CARE_PROVIDER_SITE_OTHER): Payer: 59

## 2014-09-27 VITALS — BP 102/70 | HR 96 | Temp 99.1°F | Resp 16

## 2014-09-27 DIAGNOSIS — R42 Dizziness and giddiness: Secondary | ICD-10-CM

## 2014-09-27 DIAGNOSIS — H5440 Blindness, one eye, unspecified eye: Secondary | ICD-10-CM

## 2014-09-27 DIAGNOSIS — R002 Palpitations: Secondary | ICD-10-CM

## 2014-09-27 LAB — CBC
HCT: 39.7 % (ref 39.0–52.0)
Hemoglobin: 14.1 g/dL (ref 13.0–17.0)
MCH: 30.8 pg (ref 26.0–34.0)
MCHC: 35.5 g/dL (ref 30.0–36.0)
MCV: 86.7 fL (ref 78.0–100.0)
Platelets: 241 10*3/uL (ref 150–400)
RBC: 4.58 MIL/uL (ref 4.22–5.81)
RDW: 12.8 % (ref 11.5–15.5)
WBC: 9.1 10*3/uL (ref 4.0–10.5)

## 2014-09-27 LAB — TROPONIN I: Troponin I: 0.01 ng/mL (ref <0.06)

## 2014-09-27 NOTE — Progress Notes (Signed)
[facility] [address] [place] [address] [phone]  Date:  09/27/2014   Name:  Shawn [name]   DOB:  17-Dec-1960   MRN:  [id number]  PCP:  Vena Austria, MD    Chief Complaint: Palpitations and Dizziness   History of Present Illness:  Shawn [name] is a 53 y.o. very pleasant male patient who presents with the following:  Here today with heart palpitations.  States that about a year ago his PCP diagnosed him with "atrial fibrillation" which they are treating with metoprolol.  He changed from daily toprol xl to BID lopressor recently due to cost.   He noted palpitations for about 90 minutes today that "would not go away."  He did not have any chest pain during the episode.  He did not have any LOC but did feel lightheaded.   His palpitations are now resolved.  However he was concerned and decided to come in.   He first noted the palpitations about one year ago.  This is when he was dx with the "atrial fibrillation."  Called Dr. Alyson Ingles and it does not seem that he had a fib; suspect he was actually told he had PVCs or similar He did see cardiology and had an echo and holter about one year ago he thinks- I do not have any record of this evaluation.  He saw Dr. Lacie Draft per his recollection.    He is not aware of any other heart trouble.  His father does have a pacemaker.   He has never been a smoker.   No exertional chest pain- he is quite active at his job. 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    Otherwise his history is significant for an obstructive kidney stone that led to sepsis and ICU admission in 2013.  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  Patient Active Problem List   Diagnosis Date Noted  . Sepsis(995.91) 08/27/2012  . UTI (lower urinary tract infection) 08/26/2012  . Hypotension 08/25/2012  . Hydronephrosis with obstructing calculus 08/25/2012  . SIRS (systemic inflammatory response syndrome)  08/24/2012  . Hyponatremia 08/24/2012  . Nephrolithiasis 08/24/2012  . Hydronephrosis 08/24/2012  . Acute renal failure 08/24/2012  . Leukocytosis 08/24/2012  . Kidney stone   . Hypertension   . Blind right eye     Past Medical History  Diagnosis Date  . Kidney stone   . Hypertension   . Blind right eye     accident age 12  . 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 History of gastroesophageal reflux (GERD)   . Retinal detachment     right eye caused blindness  . History of anal fissures     Episodic resulting in hematochezia  . Onychomycosis     treated with lamisil in the past    Past Surgical History  Procedure Laterality Date  . Appendectomy    . Right retina detachment    . Cystoscopy w/ ureteral stent placement  08/24/2012    Procedure: CYSTOSCOPY WITH RETROGRADE PYELOGRAM/URETERAL STENT PLACEMENT;  Surgeon: Ailene Rud, MD;  Location: [facility];  Service: Urology;  Laterality: Right;  . Cystoscopy/retrograde/ureteroscopy  08/24/2012    Procedure: CYSTOSCOPY/RETROGRADE/URETEROSCOPY;  Surgeon: Ailene Rud, MD;  Location: [facility];  Service: Urology;  Laterality: Right;  Stone Basketing  . Cystoscopy/retrograde/ureteroscopy/stone extraction with basket Left 04/11/2013    Procedure:  CYSTOSCOPY/RETROGRADE/URETEROSCOPY/STONE EXTRACTION WITH BASKET;  Surgeon: Ailene Rud, MD;  Location: [facility];  Service: Urology;  Laterality: Left;  BASKET STONE EXTRACTION WITH URETEROSCOPE      History  Substance Use Topics  . Smoking status: Former Smoker    Types: Pipe  . Smokeless tobacco: Never Used  . Alcohol Use: Yes     Comment: ocassionally beer or wine    Family History  Problem Relation Age of Onset  . Diabetes Mother   . Heart attack Father   . Hypertension Father   . CAD Father     No Known Allergies  Medication list has been reviewed and updated.  Current  Outpatient Prescriptions on File Prior to Visit  Medication Sig Dispense Refill  . lisinopril (PRINIVIL,ZESTRIL) 10 MG tablet Take 20 mg by mouth daily.       . metoprolol succinate (TOPROL-XL) 50 MG 24 hr tablet Take 50 mg by mouth daily. Take with or immediately following a meal.       No current facility-administered medications on file prior to visit.    Review of Systems:  As per HPI- otherwise negative.   Physical Examination: Filed Vitals:   09/27/14 1404  BP: 102/70  Pulse: 96  Temp: 99.1 F (37.3 C)  Resp: 16   There were no vitals filed for this visit. There is no weight on file to calculate BMI. Ideal Body Weight:    GEN: WDWN, NAD, Non-toxic, A & O x 3, muscular build, looks well HEENT: Atraumatic, Normocephalic. Neck supple. No masses, No LAD.  Bilateral TM wnl, oropharynx normal.  blind right eye due to trauma, left eye normal  Ears and Nose: No external deformity. CV: RRR, No M/G/R. No JVD. No thrill. No extra heart sounds. PULM: CTA B, no wheezes, crackles, rhonchi. No retractions. No resp. distress. No accessory muscle use. ABD: S, NT, ND.  EXTR: No c/c/e NEURO Normal gait.  PSYCH: Normally interactive. Conversant. Not depressed or anxious appearing.  Calm demeanor.   EKG:  NSR, no ST elevation or depression.  Full page rhythm strip showed no abnormal beats.  Rate is normal at 87  [facility] reading (PRIMARY) by  Dr. Lorelei Pont. CXR:  negative CHEST 2 VIEW  COMPARISON: 08/25/2012.  FINDINGS: Mediastinum structures nor free lungs are clear. Heart size normal. No acute bony abnormality.  IMPRESSION: No active cardiopulmonary disease.  Assessment and Plan: Palpitations - Plan: EKG 12-Lead, DG Chest 2 View, Troponin I, CBC, Comprehensive metabolic panel, TSH, Ambulatory referral to Cardiology  Lightheaded  Blind right eye  [name] is here today with palpitations that lasted for about 90 minutes earlier today, now resolved. No chest pain.  Suspect he has a  history of PVCs or other benign early beats but do not have records. He did have a cardiac evaluation in the past, we think per Dr. Lacie Draft who is now with [facility].  Will refer back to Dr. Lacie Draft for possible repeat holter.  Continue BB for now, and will check troponin to ensure no sign of myocardial ischemia.  Also check TSH and CMP, CBC.    Results for orders placed in visit on 09/27/14  TROPONIN I      Result Value Ref Range   Troponin I 0.01  <0.06 ng/mL   Called in the evening to let Khyran know his troponin is negative.  He will watch his sx and follow-up in the meantime if any prolonged palpitations, right away if chest pain.  Signed Lamar Blinks, MD

## 2014-09-27 NOTE — Patient Instructions (Signed)
I will be in touch with you regarding your troponin test later today, and the rest of your labs asap.  We will get you back in to see your cardiologist Dr. Lacie Draft.  It seems that your palpitations are likely caused by a benign process such as "PVCs."  However if you have another episode of prolonged palpitations I would recommend that you come in for an EKG.    If you have any chest pain please seek care right away!

## 2014-09-28 ENCOUNTER — Encounter: Payer: Self-pay | Admitting: Family Medicine

## 2014-09-28 LAB — COMPREHENSIVE METABOLIC PANEL
ALT: 27 U/L (ref 0–53)
AST: 22 U/L (ref 0–37)
Albumin: 4.6 g/dL (ref 3.5–5.2)
Alkaline Phosphatase: 40 U/L (ref 39–117)
BUN: 13 mg/dL (ref 6–23)
CO2: 27 mEq/L (ref 19–32)
Calcium: 9.6 mg/dL (ref 8.4–10.5)
Chloride: 101 mEq/L (ref 96–112)
Creat: 1 mg/dL (ref 0.50–1.35)
Glucose, Bld: 135 mg/dL — ABNORMAL HIGH (ref 70–99)
Potassium: 4.2 mEq/L (ref 3.5–5.3)
Sodium: 138 mEq/L (ref 135–145)
Total Bilirubin: 0.7 mg/dL (ref 0.2–1.2)
Total Protein: 7.1 g/dL (ref 6.0–8.3)

## 2014-09-28 LAB — TSH: TSH: 0.72 u[IU]/mL (ref 0.350–4.500)

## 2014-11-08 ENCOUNTER — Ambulatory Visit: Payer: 59 | Admitting: Cardiovascular Disease

## 2014-12-19 ENCOUNTER — Ambulatory Visit (INDEPENDENT_AMBULATORY_CARE_PROVIDER_SITE_OTHER): Payer: BC Managed Care – PPO | Admitting: Cardiovascular Disease

## 2014-12-19 ENCOUNTER — Encounter: Payer: Self-pay | Admitting: Cardiovascular Disease

## 2014-12-19 VITALS — BP 110/62 | HR 68 | Ht 73.0 in | Wt 217.3 lb

## 2014-12-19 DIAGNOSIS — R0609 Other forms of dyspnea: Secondary | ICD-10-CM | POA: Insufficient documentation

## 2014-12-19 DIAGNOSIS — R0600 Dyspnea, unspecified: Secondary | ICD-10-CM

## 2014-12-19 DIAGNOSIS — R079 Chest pain, unspecified: Secondary | ICD-10-CM | POA: Insufficient documentation

## 2014-12-19 NOTE — Patient Instructions (Signed)
Echocardiogram. Echocardiography is a painless test that uses sound waves to create images of your heart. It provides your doctor with information about the size and shape of your heart and how well your heart's chambers and valves are working. This procedure takes approximately one hour. There are no restrictions for this procedure.  Your physician has requested that you have an exercise stress myoview. For further information please visit [email or web address]. Please follow instruction sheet, as given.  Please follow up with Dr. Gwenlyn Found in 4-6 weeks.

## 2014-12-19 NOTE — Progress Notes (Signed)
     12/19/2014 Shawn [name]   03/12/1961  [id number]  Primary Physician Vena Austria, MD Primary Cardiologist: Lorretta Harp MD Renae Gloss   HPI:  Mr. Pietrzak is a 54 year old mildly overweight married Caucasian male father of one child who works at [employer]. He was referred by Dr. Silvestre Mesi for cardiovascular evaluation because of these symptoms and palpitations. He basically has no cardiac risk factors other than family history. His father did have heart disease in his older years. He has never had a heart attack or stroke. He gives a 2 to three-month history of aggressive dyspnea on exertion and substernal chest burning which she treated with reflux. The pain does not radiate. It is not related to eating and can occur randomly.   Current Outpatient Prescriptions  Medication Sig Dispense Refill  . lisinopril (PRINIVIL,ZESTRIL) 20 MG tablet Take 20 mg by mouth daily.  1   No current facility-administered medications for this visit.    No Known Allergies  History   Social History  . Marital Status: Married    Spouse Name: N/A    Number of Children: N/A  . Years of Education: N/A   Occupational History  . Not on file.   Social History Main Topics  . Smoking status: Former Smoker    Types: Pipe  . Smokeless tobacco: Never Used  . Alcohol Use: Yes     Comment: ocassionally beer or wine  . Drug Use: No  . Sexual Activity: No   Other Topics Concern  . Not on file   Social History Narrative     Review of Systems: General: negative for chills, fever, night sweats or weight changes.  Cardiovascular: negative for chest pain, dyspnea on exertion, edema, orthopnea, palpitations, paroxysmal nocturnal dyspnea or shortness of breath Dermatological: negative for rash Respiratory: negative for cough or wheezing Urologic: negative for hematuria Abdominal: negative for nausea, vomiting, diarrhea, bright red blood per rectum, melena,  or hematemesis Neurologic: negative for visual changes, syncope, or dizziness All other systems reviewed and are otherwise negative except as noted above.    Blood pressure 110/62, pulse 68, height 6\' 1"  (1.854 m), weight 217 lb 4.8 oz (98.567 kg).  General appearance: alert and no distress Neck: no adenopathy, no carotid bruit, no JVD, supple, symmetrical, trachea midline and thyroid not enlarged, symmetric, no tenderness/mass/nodules Lungs: clear to auscultation bilaterally Heart: regular rate and rhythm, S1, S2 normal, no murmur, click, rub or gallop Extremities: extremities normal, atraumatic, no cyanosis or edema  EKG normal sinus rhythm at 68 with early R-wave transition. Apparently reviewed this EKG  ASSESSMENT AND PLAN:   Chest pain Patient gives a 2-3 month history of chest pain which she attributes to reflux. As a burning type pain with no association with eating. It occurs randomly but has been improved with PPI. He does have a family history of heart disease with a father who has had a heart attack in the past. I'm going to get an exercise Myoview stress test to rule out an ischemic etiology  Dyspnea on exertion Patient gives a three-month history of progressive dyspnea on exertion. He does not smoke. I'm concerned this may be an anginal equivalent. Alternatively, it could be related to reflux. I'm going to get a 2-D echo to evaluate LV function.      Lorretta Harp MD FACP,FACC,FAHA, [facility] 12/19/2014 3:50 PM

## 2014-12-19 NOTE — Assessment & Plan Note (Signed)
Patient gives a 2-3 month history of chest pain which she attributes to reflux. As a burning type pain with no association with eating. It occurs randomly but has been improved with PPI. He does have a family history of heart disease with a father who has had a heart attack in the past. I'm going to get an exercise Myoview stress test to rule out an ischemic etiology

## 2014-12-19 NOTE — Assessment & Plan Note (Signed)
Patient gives a three-month history of progressive dyspnea on exertion. He does not smoke. I'm concerned this may be an anginal equivalent. Alternatively, it could be related to reflux. I'm going to get a 2-D echo to evaluate LV function.

## 2014-12-22 ENCOUNTER — Encounter (HOSPITAL_COMMUNITY): Payer: Self-pay | Admitting: *Deleted

## 2014-12-28 ENCOUNTER — Telehealth (HOSPITAL_COMMUNITY): Payer: Self-pay

## 2014-12-28 NOTE — Telephone Encounter (Signed)
Encounter complete. 

## 2015-01-02 ENCOUNTER — Ambulatory Visit (HOSPITAL_BASED_OUTPATIENT_CLINIC_OR_DEPARTMENT_OTHER)
Admission: RE | Admit: 2015-01-02 | Discharge: 2015-01-02 | Disposition: A | Discharge status: Home / Self Care | Payer: BLUE CROSS/BLUE SHIELD | Source: Ambulatory Visit | Attending: Cardiology | Admitting: Cardiology

## 2015-01-02 ENCOUNTER — Ambulatory Visit (HOSPITAL_COMMUNITY)
Admission: RE | Admit: 2015-01-02 | Discharge: 2015-01-02 | Disposition: A | Discharge status: Home / Self Care | Payer: BLUE CROSS/BLUE SHIELD | Source: Ambulatory Visit | Attending: Cardiology | Admitting: Cardiology

## 2015-01-02 DIAGNOSIS — R079 Chest pain, unspecified: Secondary | ICD-10-CM | POA: Insufficient documentation

## 2015-01-02 DIAGNOSIS — R0609 Other forms of dyspnea: Secondary | ICD-10-CM | POA: Insufficient documentation

## 2015-01-02 DIAGNOSIS — R55 Syncope and collapse: Secondary | ICD-10-CM | POA: Diagnosis not present

## 2015-01-02 DIAGNOSIS — E669 Obesity, unspecified: Secondary | ICD-10-CM | POA: Insufficient documentation

## 2015-01-02 DIAGNOSIS — I10 Essential (primary) hypertension: Secondary | ICD-10-CM | POA: Diagnosis not present

## 2015-01-02 DIAGNOSIS — R42 Dizziness and giddiness: Secondary | ICD-10-CM | POA: Insufficient documentation

## 2015-01-02 DIAGNOSIS — R0600 Dyspnea, unspecified: Secondary | ICD-10-CM

## 2015-01-02 DIAGNOSIS — Z8249 Family history of ischemic heart disease and other diseases of the circulatory system: Secondary | ICD-10-CM | POA: Insufficient documentation

## 2015-01-02 DIAGNOSIS — R002 Palpitations: Secondary | ICD-10-CM | POA: Insufficient documentation

## 2015-01-02 DIAGNOSIS — R110 Nausea: Secondary | ICD-10-CM | POA: Diagnosis not present

## 2015-01-02 DIAGNOSIS — I369 Nonrheumatic tricuspid valve disorder, unspecified: Secondary | ICD-10-CM

## 2015-01-02 MED ORDER — TECHNETIUM TC 99M SESTAMIBI GENERIC - CARDIOLITE
32.7000 | Freq: Once | INTRAVENOUS | Status: AC | PRN
  Administered 2015-01-02: 32.7 via INTRAVENOUS

## 2015-01-02 MED ORDER — TECHNETIUM TC 99M SESTAMIBI GENERIC - CARDIOLITE
10.3000 | Freq: Once | INTRAVENOUS | Status: AC | PRN
  Administered 2015-01-02: 10 via INTRAVENOUS

## 2015-01-02 NOTE — Progress Notes (Signed)
2D Echocardiogram Complete.  01/02/2015   Shawn [name], Shawn [name]

## 2015-01-02 NOTE — Procedures (Addendum)
[facility] [address] [place] [address] [phone]  Cardiology Nuclear Med Study  Shawn [name] is a 54 y.o. male     MRN : [id number]     DOB: [birth year]  Procedure Date: 01/02/2015  Nuclear Med Background Indication for Stress Test:  Evaluation for Ischemia History:  AFIB;No prior respiratory history reported. No prior NUC MPI for comparison. Cardiac Risk Factors: Family History - CAD, Hypertension and Overweight  Symptoms:  Chest Pain, Dizziness, DOE, Fatigue, Light-Headedness, Nausea, Near Syncope and Palpitations   Nuclear Pre-Procedure Caffeine/Decaff Intake:  9:00pm NPO After: 7:00am   IV Site: R Forearm  IV 0.9% NS with Angio Cath:  22g  Chest Size (in):  46"  IV Started by: Rolene Course, RN  Height: 6\' 1"  (1.854 m)  Cup Size: n/a  BMI:  Body mass index is 28.64 kg/(m^2). Weight:  217 lb (98.431 kg)   Tech Comments:  n/a    Nuclear Med Study 1 or 2 day study: 1 day  Stress Test Type:  Stress  Order Authorizing Provider:  Quay Burow, MD   Resting Radionuclide: Technetium 8m Sestamibi  Resting Radionuclide Dose: 10.3 mCi   Stress Radionuclide:  Technetium 53m Sestamibi  Stress Radionuclide Dose: 32.7 mCi           Stress Protocol Rest HR: 80 Stress HR:  166  Rest BP: 133/94 Stress BP: 172/93  Exercise Time (min): 11 METS: 13.3   Predicted Max HR: 165 bpm % Max HR: 100.61 bpm Rate Pressure Product: 28552  Dose of Adenosine (mg):  n/a Dose of Lexiscan: n/a mg  Dose of Atropine (mg): n/a Dose of Dobutamine: n/a mcg/kg/min (at max HR)  Stress Test Technologist: Leane Para, CCT Nuclear Technologist: Imagene Riches, CNMT   Rest Procedure:  Myocardial perfusion imaging was performed at rest 45 minutes following the intravenous administration of Technetium 2m Sestamibi. Stress Procedure:  The patient performed treadmill exercise using a Bruce  Protocol for 11 minutes. The patient stopped due  to Fatigue and denied any chest pain.  There were  ST-T wave changes.  Technetium 61m Sestamibi was injected IV at peak exercise and myocardial perfusion imaging was performed after a brief delay.  Transient Ischemic Dilatation (Normal <1.22):  0.93  QGS EDV:  107 ml QGS ESV:  47 ml LV Ejection Fraction: 56%  Rest ECG: NSR - Normal EKG  Stress ECG: No significant change from baseline ECG  QPS Raw Data Images:  Normal; no motion artifact; normal heart/lung ratio. Stress Images:  Normal homogeneous uptake in all areas of the myocardium. Rest Images:  Normal homogeneous uptake in all areas of the myocardium. Subtraction (SDS):  No evidence of ischemia.  Impression Exercise Capacity:  Excellent exercise capacity. BP Response:  Normal blood pressure response. Clinical Symptoms:  No significant symptoms noted. ECG Impression:  No significant ST segment change suggestive of ischemia. Comparison with Prior Nuclear Study: No previous nuclear study performed  Overall Impression:  Normal stress nuclear study.  LV Wall Motion:  NL LV Function; NL Wall Motion; LVEF 56%  Pixie Casino, MD, [facility] Board Certified in Nuclear Cardiology Attending Cardiologist [place], MD  01/02/2015 6:04 PM

## 2015-01-04 ENCOUNTER — Other Ambulatory Visit: Payer: Self-pay | Admitting: Family Medicine

## 2015-01-04 DIAGNOSIS — R1011 Right upper quadrant pain: Secondary | ICD-10-CM

## 2015-01-08 ENCOUNTER — Encounter: Payer: Self-pay | Admitting: *Deleted

## 2015-01-12 ENCOUNTER — Ambulatory Visit
Admission: RE | Admit: 2015-01-12 | Discharge: 2015-01-12 | Disposition: A | Discharge status: Home / Self Care | Payer: BLUE CROSS/BLUE SHIELD | Source: Ambulatory Visit | Attending: Family Medicine | Admitting: Family Medicine

## 2015-01-12 DIAGNOSIS — R1011 Right upper quadrant pain: Secondary | ICD-10-CM

## 2015-01-16 ENCOUNTER — Other Ambulatory Visit: Payer: Self-pay | Admitting: Family Medicine

## 2015-01-16 DIAGNOSIS — G8929 Other chronic pain: Secondary | ICD-10-CM

## 2015-01-16 DIAGNOSIS — R109 Unspecified abdominal pain: Principal | ICD-10-CM

## 2015-01-18 ENCOUNTER — Ambulatory Visit
Admission: RE | Admit: 2015-01-18 | Discharge: 2015-01-18 | Disposition: A | Discharge status: Home / Self Care | Payer: BLUE CROSS/BLUE SHIELD | Source: Ambulatory Visit | Attending: Family Medicine | Admitting: Family Medicine

## 2015-01-18 DIAGNOSIS — G8929 Other chronic pain: Secondary | ICD-10-CM

## 2015-01-18 DIAGNOSIS — R109 Unspecified abdominal pain: Principal | ICD-10-CM

## 2015-01-30 ENCOUNTER — Ambulatory Visit: Payer: Self-pay | Admitting: Cardiovascular Disease

## 2015-05-22 ENCOUNTER — Ambulatory Visit (INDEPENDENT_AMBULATORY_CARE_PROVIDER_SITE_OTHER): Payer: BLUE CROSS/BLUE SHIELD | Admitting: Emergency Medicine

## 2015-05-22 VITALS — BP 114/66 | HR 93 | Temp 98.6°F | Resp 18 | Ht 71.25 in | Wt 215.6 lb

## 2015-05-22 DIAGNOSIS — R61 Generalized hyperhidrosis: Secondary | ICD-10-CM

## 2015-05-22 DIAGNOSIS — K219 Gastro-esophageal reflux disease without esophagitis: Secondary | ICD-10-CM | POA: Diagnosis not present

## 2015-05-22 LAB — CBC WITH DIFFERENTIAL/PLATELET
Basophils Absolute: 0.1 10*3/uL (ref 0.0–0.1)
Basophils Relative: 1 % (ref 0–1)
Eosinophils Absolute: 0.2 10*3/uL (ref 0.0–0.7)
Eosinophils Relative: 2 % (ref 0–5)
HCT: 38.5 % — ABNORMAL LOW (ref 39.0–52.0)
Hemoglobin: 13.3 g/dL (ref 13.0–17.0)
Lymphocytes Relative: 18 % (ref 12–46)
Lymphs Abs: 1.6 10*3/uL (ref 0.7–4.0)
MCH: 30 pg (ref 26.0–34.0)
MCHC: 34.5 g/dL (ref 30.0–36.0)
MCV: 86.7 fL (ref 78.0–100.0)
MPV: 10.2 fL (ref 8.6–12.4)
Monocytes Absolute: 0.7 10*3/uL (ref 0.1–1.0)
Monocytes Relative: 8 % (ref 3–12)
Neutro Abs: 6.2 10*3/uL (ref 1.7–7.7)
Neutrophils Relative %: 71 % (ref 43–77)
Platelets: 268 10*3/uL (ref 150–400)
RBC: 4.44 MIL/uL (ref 4.22–5.81)
RDW: 12.8 % (ref 11.5–15.5)
WBC: 8.8 10*3/uL (ref 4.0–10.5)

## 2015-05-22 LAB — COMPREHENSIVE METABOLIC PANEL
ALT: 24 U/L (ref 0–53)
AST: 20 U/L (ref 0–37)
Albumin: 4.4 g/dL (ref 3.5–5.2)
Alkaline Phosphatase: 41 U/L (ref 39–117)
BUN: 11 mg/dL (ref 6–23)
CO2: 27 mEq/L (ref 19–32)
Calcium: 9.3 mg/dL (ref 8.4–10.5)
Chloride: 103 mEq/L (ref 96–112)
Creat: 0.86 mg/dL (ref 0.50–1.35)
Glucose, Bld: 107 mg/dL — ABNORMAL HIGH (ref 70–99)
Potassium: 4 mEq/L (ref 3.5–5.3)
Sodium: 139 mEq/L (ref 135–145)
Total Bilirubin: 0.5 mg/dL (ref 0.2–1.2)
Total Protein: 7.1 g/dL (ref 6.0–8.3)

## 2015-05-22 LAB — POCT URINALYSIS DIPSTICK
Bilirubin, UA: NEGATIVE
Blood, UA: NEGATIVE
Glucose, UA: NEGATIVE
Ketones, UA: NEGATIVE
Leukocytes, UA: NEGATIVE
Nitrite, UA: NEGATIVE
Protein, UA: NEGATIVE
Spec Grav, UA: 1.025
Urobilinogen, UA: 0.2
pH, UA: 6

## 2015-05-22 LAB — POCT UA - MICROSCOPIC ONLY
Bacteria, U Microscopic: NEGATIVE
Casts, Ur, LPF, POC: NEGATIVE
Crystals, Ur, HPF, POC: NEGATIVE
Mucus, UA: POSITIVE
Yeast, UA: NEGATIVE

## 2015-05-22 LAB — POCT SEDIMENTATION RATE: POCT SED RATE: 3 mm/hr (ref 0–22)

## 2015-05-22 MED ORDER — PAROXETINE HCL 20 MG PO TABS: 20.0000 mg | ORAL_TABLET | Freq: Every day | ORAL | Status: DC

## 2015-05-22 MED ORDER — LANSOPRAZOLE 30 MG PO CPDR: 30.0000 mg | DELAYED_RELEASE_CAPSULE | Freq: Every day | ORAL | Status: DC

## 2015-05-22 MED ORDER — SUCRALFATE 1 G PO TABS: ORAL_TABLET | ORAL | Status: DC

## 2015-05-22 NOTE — Progress Notes (Signed)
  Tuberculosis Risk Questionnaire  1. No Were you born outside the [place] in one of the following parts of the world: [place], [place], [place], [place] or [place]?    2. No Have you traveled outside the [place] and lived for more than one month in one of the following parts of the world: [place], [place], [place], [place] or [place]?    3. No Do you have a compromised immune system such as from any of the following conditions:HIV/AIDS, organ or bone marrow transplantation, diabetes, immunosuppressive medicines (e.g. Prednisone, Remicaide), leukemia, lymphoma, cancer of the head or neck, gastrectomy or jejunal bypass, end-stage renal disease (on dialysis), or silicosis?     4. No Have you ever or do you plan on working in: a residential care center, a health care facility, a jail or prison or homeless shelter?    5. No Have you ever: injected illegal drugs, used crack cocaine, lived in a homeless shelter  or been in jail or prison?     6. No Have you ever been exposed to anyone with infectious tuberculosis?    Tuberculosis Symptom Questionnaire  Do you currently have any of the following symptoms?  1. No Unexplained cough lasting more than 3 weeks?   2. No Unexplained fever lasting more than 3 weeks.   3. No  Night Sweats (sweating that leaves the bedclothes and sheets wet)     4. Yes  Shortness of Breath   5. No Chest Pain   6. No Unintentional weight loss    7. No Unexplained fatigue (very tired for no reason)

## 2015-05-22 NOTE — Progress Notes (Signed)
Subjective:  Patient ID: Shawn [name], male    DOB: October 26, 1961  Age: 54 y.o. MRN: [id number]  CC: Shortness of Breath and Abdominal Pain   HPI Shawn [name] presents  with multiple complaints. He gets very hot and lightheaded and short of breath. This most recently occurred on Sunday when he was in his choir robes in the loft at church she became very hot and sweaty and had a to go out side and remove his choir gowns. As he was overheated.  He said that he awakens at night multiple occasions sometimes with a hot sweaty feeling and dry mouth. On other times he has episodes of shortness of breath palpitations and chest tightness associated with some numbness in his arms. He has been evaluated at length in the past for cardiovascular disease with stress test and lab work. He was seen by his family doctor last week and had lab work done he doesn't know what the results of his tests were. He's not been eating normally has a lot of heartburn and indigestion he said he was told that in the past that he had gastroesophageal reflux disorder and was taking Prevacid at that time. He stopped taking the medication in the past probably 5 years ago because he stopped having symptoms now have returned. He has no blood in stool or black stool. No nausea or vomiting. He is a nonsmoker. Drinks occasionally. His supervisor and small business. Denies being under any particular stress.Marland Kitchen  History Hillery has a past medical history of Kidney stone; Hypertension; Blind right eye; Meniere's disease; History of gastroesophageal reflux (GERD); Retinal detachment; History of anal fissures; Onychomycosis; Chest pain; and Dyspnea on exertion.   He has past surgical history that includes Appendectomy; Right retina detachment; Cystoscopy w/ ureteral stent placement (08/24/2012); Cystoscopy/retrograde/ureteroscopy (08/24/2012); and Cystoscopy/retrograde/ureteroscopy/stone extraction with basket (Left, 04/11/2013).   His  family  history includes Aneurysm in his mother and paternal grandmother; CAD in his father; Diabetes in his mother; Heart attack in his father and paternal grandfather; Heart disease in his father; Hypertension in his father; Stroke in his mother.  He   reports that he has quit smoking. His smoking use included Pipe. He has never used smokeless tobacco. He reports that he drinks alcohol. He reports that he does not use illicit drugs.  Outpatient Prescriptions Prior to Visit  Medication Sig Dispense Refill  . lisinopril (PRINIVIL,ZESTRIL) 20 MG tablet Take 20 mg by mouth daily.  1   No facility-administered medications prior to visit.    History   Social History  . Marital Status: Married    Spouse Name: N/A  . Number of Children: N/A  . Years of Education: N/A   Social History Main Topics  . Smoking status: Former Smoker    Types: Pipe  . Smokeless tobacco: Never Used  . Alcohol Use: Yes     Comment: ocassionally beer or wine  . Drug Use: No  . Sexual Activity: No   Other Topics Concern  . None   Social History Narrative     Review of Systems  Constitutional: Positive for appetite change and fatigue. Negative for fever and chills.  HENT: Negative for congestion, ear pain, postnasal drip, sinus pressure and sore throat.   Eyes: Negative for pain and redness.  Respiratory: Positive for chest tightness and shortness of breath. Negative for cough and wheezing.   Cardiovascular: Positive for palpitations. Negative for leg swelling.  Gastrointestinal: Positive for abdominal pain. Negative for nausea, vomiting, diarrhea, constipation  and blood in stool.  Endocrine: Negative for polyuria.  Genitourinary: Negative for dysuria, urgency, frequency and flank pain.  Musculoskeletal: Negative for gait problem.  Skin: Negative for rash.  Neurological: Negative for weakness and headaches.  Psychiatric/Behavioral: Positive for sleep disturbance. Negative for confusion and decreased  concentration. The patient is not nervous/anxious.     Objective:  BP 114/66 mmHg  Pulse 93  Temp(Src) 98.6 F (37 C) (Oral)  Resp 18  Ht 5' 11.25" (1.81 m)  Wt 215 lb 9.6 oz (97.796 kg)  BMI 29.85 kg/m2  SpO2 98%  Physical Exam  Constitutional: He is oriented to person, place, and time. He appears well-developed and well-nourished. No distress.  HENT:  Head: Normocephalic and atraumatic.  Right Ear: External ear normal.  Left Ear: External ear normal.  Nose: Nose normal.  Eyes: Conjunctivae and EOM are normal. Pupils are equal, round, and reactive to light. No scleral icterus.  Neck: Normal range of motion. Neck supple. No tracheal deviation present.  Cardiovascular: Normal rate, regular rhythm and normal heart sounds.   Pulmonary/Chest: Effort normal. No respiratory distress. He has no wheezes. He has no rales.  Abdominal: He exhibits no mass. There is no tenderness. There is no rebound and no guarding.  Musculoskeletal: He exhibits no edema.  Lymphadenopathy:    He has no cervical adenopathy.  Neurological: He is alert and oriented to person, place, and time. Coordination normal.  Skin: Skin is warm and dry. No rash noted.  Psychiatric: He has a normal mood and affect. His behavior is normal.      Assessment & Plan:   Shawn [name] was seen today for shortness of breath and abdominal pain.  Diagnoses and all orders for this visit:  Night sweats Orders: -     Comprehensive metabolic panel -     CBC with Differential/Platelet -     TSH -     HIV antibody -     POCT SEDIMENTATION RATE -     POCT urinalysis dipstick -     POCT UA - Microscopic Only -     TB Skin Test -     RPR  Gastroesophageal reflux disease, esophagitis presence not specified  Other orders -     sucralfate (CARAFATE) 1 G tablet; 1 tablet 1 hr ac and hs -     lansoprazole (PREVACID) 30 MG capsule; Take 1 capsule (30 mg total) by mouth daily at 12 noon. -     PARoxetine (PAXIL) 20 MG tablet; Take 1  tablet (20 mg total) by mouth daily.   I am having Shawn [name] start on sucralfate, lansoprazole, and PARoxetine. I am also having him maintain his lisinopril and testosterone.  Meds ordered this encounter  Medications  . testosterone (ANDROGEL) 50 MG/5GM (1%) GEL    Sig: Place 5 g onto the skin daily.  . sucralfate (CARAFATE) 1 G tablet    Sig: 1 tablet 1 hr ac and hs    Dispense:  120 tablet    Refill:  0  . lansoprazole (PREVACID) 30 MG capsule    Sig: Take 1 capsule (30 mg total) by mouth daily at 12 noon.    Dispense:  30 capsule    Refill:  12  . PARoxetine (PAXIL) 20 MG tablet    Sig: Take 1 tablet (20 mg total) by mouth daily.    Dispense:  30 tablet    Refill:  5   Number possibilities exist in this patient including cardiovascular disease which  seems to have been excluded by prior testing. He has no particular stress in his life he believes other than the death of his mother and father and and a disabled nephew within the last 6 months. I believe his problem is a panic disorder and he also has symptoms suggestive of gastroesophageal reflux disorder. His night sweats deserve workup and will discuss that matter with him further when we have the results of his lab tests. He has no other complaint. He was content with this plan   Appropriate red flag conditions were discussed with the patient as well as actions that should be taken.  Patient expressed his understanding.  Follow-up: Return in about 1 month (around 06/21/2015).  Roselee Culver, MD

## 2015-05-22 NOTE — Patient Instructions (Signed)
Panic Attacks Panic attacks are sudden, short-livedsurges of severe anxiety, fear, or discomfort. They may occur for no reason when you are relaxed, when you are anxious, or when you are sleeping. Panic attacks may occur for a number of reasons:   Healthy people occasionally have panic attacks in extreme, life-threatening situations, such as war or natural disasters. Normal anxiety is a protective mechanism of the body that helps us react to danger (fight or flight response).  Panic attacks are often seen with anxiety disorders, such as panic disorder, social anxiety disorder, generalized anxiety disorder, and phobias. Anxiety disorders cause excessive or uncontrollable anxiety. They may interfere with your relationships or other life activities.  Panic attacks are sometimes seen with other mental illnesses, such as depression and posttraumatic stress disorder.  Certain medical conditions, prescription medicines, and drugs of abuse can cause panic attacks. SYMPTOMS  Panic attacks start suddenly, peak within 20 minutes, and are accompanied by four or more of the following symptoms:  Pounding heart or fast heart rate (palpitations).  Sweating.  Trembling or shaking.  Shortness of breath or feeling smothered.  Feeling choked.  Chest pain or discomfort.  Nausea or strange feeling in your stomach.  Dizziness, light-headedness, or feeling like you will faint.  Chills or hot flushes.  Numbness or tingling in your lips or hands and feet.  Feeling that things are not real or feeling that you are not yourself.  Fear of losing control or going crazy.  Fear of dying. Some of these symptoms can mimic serious medical conditions. For example, you may think you are having a heart attack. Although panic attacks can be very scary, they are not life threatening. DIAGNOSIS  Panic attacks are diagnosed through an assessment by your health care provider. Your health care provider will ask  questions about your symptoms, such as where and when they occurred. Your health care provider will also ask about your medical history and use of alcohol and drugs, including prescription medicines. Your health care provider may order blood tests or other studies to rule out a serious medical condition. Your health care provider may refer you to a mental health professional for further evaluation. TREATMENT   Most healthy people who have one or two panic attacks in an extreme, life-threatening situation will not require treatment.  The treatment for panic attacks associated with anxiety disorders or other mental illness typically involves counseling with a mental health professional, medicine, or a combination of both. Your health care provider will help determine what treatment is best for you.  Panic attacks due to physical illness usually go away with treatment of the illness. If prescription medicine is causing panic attacks, talk with your health care provider about stopping the medicine, decreasing the dose, or substituting another medicine.  Panic attacks due to alcohol or drug abuse go away with abstinence. Some adults need professional help in order to stop drinking or using drugs. HOME CARE INSTRUCTIONS   Take all medicines as directed by your health care provider.   Schedule and attend follow-up visits as directed by your health care provider. It is important to keep all your appointments. SEEK MEDICAL CARE IF:  You are not able to take your medicines as prescribed.  Your symptoms do not improve or get worse. SEEK IMMEDIATE MEDICAL CARE IF:   You experience panic attack symptoms that are different than your usual symptoms.  You have serious thoughts about hurting yourself or others.  You are taking medicine for panic attacks and   have a serious side effect. MAKE SURE YOU:  Understand these instructions.  Will watch your condition.  Will get help right away if you are not  doing well or get worse. Document Released: 12/01/2005 Document Revised: 12/06/2013 Document Reviewed: 07/15/2013 [facility] Patient Information 2015 [place], [place]. This information is not intended to replace advice given to you by your health care provider. Make sure you discuss any questions you have with your health care provider. Gastroesophageal Reflux Disease, Adult Gastroesophageal reflux disease (GERD) happens when acid from your stomach flows up into the esophagus. When acid comes in contact with the esophagus, the acid causes soreness (inflammation) in the esophagus. Over time, GERD may create small holes (ulcers) in the lining of the esophagus. CAUSES   Increased body weight. This puts pressure on the stomach, making acid rise from the stomach into the esophagus.  Smoking. This increases acid production in the stomach.  Drinking alcohol. This causes decreased pressure in the lower esophageal sphincter (valve or ring of muscle between the esophagus and stomach), allowing acid from the stomach into the esophagus.  Late evening meals and a full stomach. This increases pressure and acid production in the stomach.  A malformed lower esophageal sphincter. Sometimes, no cause is found. SYMPTOMS   Burning pain in the lower part of the mid-chest behind the breastbone and in the mid-stomach area. This may occur twice a week or more often.  Trouble swallowing.  Sore throat.  Dry cough.  Asthma-like symptoms including chest tightness, shortness of breath, or wheezing. DIAGNOSIS  Your caregiver may be able to diagnose GERD based on your symptoms. In some cases, X-rays and other tests may be done to check for complications or to check the condition of your stomach and esophagus. TREATMENT  Your caregiver may recommend over-the-counter or prescription medicines to help decrease acid production. Ask your caregiver before starting or adding any new medicines.  HOME CARE INSTRUCTIONS    Change the factors that you can control. Ask your caregiver for guidance concerning weight loss, quitting smoking, and alcohol consumption.  Avoid foods and drinks that make your symptoms worse, such as:  Caffeine or alcoholic drinks.  Chocolate.  Peppermint or mint flavorings.  Garlic and onions.  Spicy foods.  Citrus fruits, such as oranges, lemons, or limes.  Tomato-based foods such as sauce, chili, salsa, and pizza.  Fried and fatty foods.  Avoid lying down for the 3 hours prior to your bedtime or prior to taking a nap.  Eat small, frequent meals instead of large meals.  Wear loose-fitting clothing. Do not wear anything tight around your waist that causes pressure on your stomach.  Raise the head of your bed 6 to 8 inches with wood blocks to help you sleep. Extra pillows will not help.  Only take over-the-counter or prescription medicines for pain, discomfort, or fever as directed by your caregiver.  Do not take aspirin, ibuprofen, or other nonsteroidal anti-inflammatory drugs (NSAIDs). SEEK IMMEDIATE MEDICAL CARE IF:   You have pain in your arms, neck, jaw, teeth, or back.  Your pain increases or changes in intensity or duration.  You develop nausea, vomiting, or sweating (diaphoresis).  You develop shortness of breath, or you faint.  Your vomit is green, yellow, black, or looks like coffee grounds or blood.  Your stool is red, bloody, or black. These symptoms could be signs of other problems, such as heart disease, gastric bleeding, or esophageal bleeding. MAKE SURE YOU:   Understand these instructions.  Will watch  your condition.  Will get help right away if you are not doing well or get worse. Document Released: 09/10/2005 Document Revised: 02/23/2012 Document Reviewed: 06/20/2011 [facility] Patient Information 2015 [place], [place]. This information is not intended to replace advice given to you by your health care provider. Make sure you discuss any  questions you have with your health care provider.

## 2015-05-23 LAB — HIV ANTIBODY (ROUTINE TESTING W REFLEX): HIV 1&2 Ab, 4th Generation: NONREACTIVE

## 2015-05-23 LAB — RPR

## 2015-05-23 LAB — TSH: TSH: 0.499 u[IU]/mL (ref 0.350–4.500)

## 2015-05-23 NOTE — Progress Notes (Signed)
LMOM to give [place] a call back to make an appt  With Dr copland for continuing care

## 2015-05-25 ENCOUNTER — Ambulatory Visit (INDEPENDENT_AMBULATORY_CARE_PROVIDER_SITE_OTHER): Payer: BLUE CROSS/BLUE SHIELD | Admitting: Emergency Medicine

## 2015-05-25 VITALS — BP 100/68 | HR 80 | Temp 98.9°F | Resp 16 | Ht 72.0 in | Wt 214.0 lb

## 2015-05-25 DIAGNOSIS — R61 Generalized hyperhidrosis: Secondary | ICD-10-CM | POA: Diagnosis not present

## 2015-05-25 DIAGNOSIS — F410 Panic disorder [episodic paroxysmal anxiety] without agoraphobia: Secondary | ICD-10-CM

## 2015-05-25 DIAGNOSIS — N200 Calculus of kidney: Secondary | ICD-10-CM

## 2015-05-25 LAB — TB SKIN TEST
Induration: 0 mm
TB Skin Test: NEGATIVE

## 2015-05-25 NOTE — Progress Notes (Signed)
Subjective:  Patient ID: Shawn [name], male    DOB: 17-Mar-1961  Age: 54 y.o. MRN: [id number]  CC: Follow-up   HPI Shawn [name] presents  for reevaluation of his fatigue and night sweats. His lab work done on the previous visit was all normal. There is no evidence of urinary tract infection is concerned because he has some left flank pain is been intermittently does have a history of kidney stones. He recently underwent CT scan of the abdomen and pelvis which were negative with the exception of a kidney stone. He has no dysuria urgency or frequency denies any hesitancy or dribbling. Denies any pain in his perineum. Urethral discharge. He is frustrated because he continues to have night sweats. His TB test was read negative.  Outpatient Prescriptions Prior to Visit  Medication Sig Dispense Refill  . lisinopril (PRINIVIL,ZESTRIL) 20 MG tablet Take 20 mg by mouth daily.  1  . PARoxetine (PAXIL) 20 MG tablet Take 1 tablet (20 mg total) by mouth daily. 30 tablet 5  . testosterone (ANDROGEL) 50 MG/5GM (1%) GEL Place 5 g onto the skin daily.    . lansoprazole (PREVACID) 30 MG capsule Take 1 capsule (30 mg total) by mouth daily at 12 noon. (Patient not taking: Reported on 05/25/2015) 30 capsule 12  . sucralfate (CARAFATE) 1 G tablet 1 tablet 1 hr ac and hs (Patient not taking: Reported on 05/25/2015) 120 tablet 0   No facility-administered medications prior to visit.    History   Social History  . Marital Status: Married    Spouse Name: N/A  . Number of Children: N/A  . Years of Education: N/A   Social History Main Topics  . Smoking status: Former Smoker    Types: Pipe  . Smokeless tobacco: Never Used  . Alcohol Use: Yes     Comment: ocassionally beer or wine  . Drug Use: No  . Sexual Activity: No   Other Topics Concern  . None   Social History Narrative    Family History  Problem Relation Age of Onset  . Diabetes Mother   . Stroke Mother   . Aneurysm Mother   . Heart attack  Father   . Hypertension Father   . CAD Father   . Heart disease Father   . Aneurysm Paternal Grandmother   . Heart attack Paternal Grandfather     Past Medical History  Diagnosis Date  . Kidney stone   . Hypertension   . Blind right eye     accident age 11  . 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 History of gastroesophageal reflux (GERD)   . Retinal detachment     right eye caused blindness  . History of anal fissures     Episodic resulting in hematochezia  . Onychomycosis     treated with lamisil in the past  . Chest pain   . Dyspnea on exertion      Review of Systems  Constitutional: Positive for fever, chills and fatigue. Negative for appetite change.  HENT: Negative for congestion, ear pain, postnasal drip, sinus pressure and sore throat.   Eyes: Negative for pain and redness.  Respiratory: Negative for cough, shortness of breath and wheezing.   Cardiovascular: Negative for leg swelling.  Gastrointestinal: Negative for nausea, vomiting, abdominal pain, diarrhea, constipation and blood in stool.  Endocrine: Negative for polyuria.  Genitourinary: Negative for dysuria, urgency, frequency and flank pain.  Musculoskeletal: Negative for gait problem.  Skin: Negative for rash.  Neurological: Negative for weakness and headaches.  Psychiatric/Behavioral: Negative for confusion and decreased concentration. The patient is not nervous/anxious.     Objective:  BP 100/68 mmHg  Pulse 80  Temp(Src) 98.9 F (37.2 C)  Resp 16  Ht 6' (1.829 m)  Wt 214 lb (97.07 kg)  BMI 29.02 kg/m2  SpO2 99%  BP Readings from Last 3 Encounters:  05/25/15 100/68  05/22/15 114/66  12/19/14 110/62    Wt Readings from Last 3 Encounters:  05/25/15 214 lb (97.07 kg)  05/22/15 215 lb 9.6 oz (97.796 kg)  01/02/15 217 lb (98.431 kg)    Physical Exam  Constitutional: He is oriented to person, place, and time. He appears  well-developed and well-nourished.  HENT:  Head: Normocephalic and atraumatic.  Eyes: Conjunctivae are normal. Pupils are equal, round, and reactive to light.  Pulmonary/Chest: Effort normal.  Musculoskeletal: He exhibits no edema.  Neurological: He is alert and oriented to person, place, and time.  Skin: Skin is dry.  Psychiatric: He has a normal mood and affect. His behavior is normal. Thought content normal.    Lab Results  Component Value Date   WBC 8.8 05/22/2015   HGB 13.3 05/22/2015   HCT 38.5* 05/22/2015   PLT 268 05/22/2015   GLUCOSE 107* 05/22/2015   ALT 24 05/22/2015   AST 20 05/22/2015   NA 139 05/22/2015   K 4.0 05/22/2015   CL 103 05/22/2015   CREATININE 0.86 05/22/2015   BUN 11 05/22/2015   CO2 27 05/22/2015   TSH 0.499 05/22/2015      .  Assessment & Plan:   Shawn [name] was seen today for follow-up.  Diagnoses and all orders for this visit:  Night sweats Orders: -     Urine culture -     Culture, blood (single) -     Culture, blood (single)  Panic disorder  Kidney stone   I am having Shawn [name] maintain his lisinopril, testosterone, sucralfate, lansoprazole, and PARoxetine.  No orders of the defined types were placed in this encounter.   He has an appointment with his urologist today at noon. I suggested if he does not have a reason for this night sweats will range CAT scan of his chest as well. He'll follow up at that time.   Appropriate red flag conditions were discussed with the patient as well as actions that should be taken.  Patient expressed his understanding.  Follow-up: Return if symptoms worsen or fail to improve.  Roselee Culver, MD

## 2015-05-25 NOTE — Patient Instructions (Signed)

## 2015-05-26 LAB — URINE CULTURE
Colony Count: NO GROWTH
Organism ID, Bacteria: NO GROWTH

## 2015-05-29 ENCOUNTER — Emergency Department (HOSPITAL_COMMUNITY)
Admission: EM | Admit: 2015-05-29 | Discharge: 2015-05-29 | Disposition: A | Discharge status: Home / Self Care | Payer: BLUE CROSS/BLUE SHIELD | Attending: Emergency Medicine | Admitting: Emergency Medicine

## 2015-05-29 ENCOUNTER — Emergency Department (HOSPITAL_COMMUNITY): Payer: BLUE CROSS/BLUE SHIELD

## 2015-05-29 ENCOUNTER — Encounter (HOSPITAL_COMMUNITY): Payer: Self-pay | Admitting: Emergency Medicine

## 2015-05-29 DIAGNOSIS — R42 Dizziness and giddiness: Secondary | ICD-10-CM | POA: Diagnosis not present

## 2015-05-29 DIAGNOSIS — Z8619 Personal history of other infectious and parasitic diseases: Secondary | ICD-10-CM | POA: Diagnosis not present

## 2015-05-29 DIAGNOSIS — R002 Palpitations: Secondary | ICD-10-CM

## 2015-05-29 DIAGNOSIS — I10 Essential (primary) hypertension: Secondary | ICD-10-CM | POA: Diagnosis not present

## 2015-05-29 DIAGNOSIS — Z79899 Other long term (current) drug therapy: Secondary | ICD-10-CM | POA: Diagnosis not present

## 2015-05-29 DIAGNOSIS — H5441 Blindness, right eye, normal vision left eye: Secondary | ICD-10-CM | POA: Diagnosis not present

## 2015-05-29 DIAGNOSIS — Z87891 Personal history of nicotine dependence: Secondary | ICD-10-CM | POA: Insufficient documentation

## 2015-05-29 DIAGNOSIS — Z87442 Personal history of urinary calculi: Secondary | ICD-10-CM | POA: Diagnosis not present

## 2015-05-29 DIAGNOSIS — K219 Gastro-esophageal reflux disease without esophagitis: Secondary | ICD-10-CM | POA: Diagnosis not present

## 2015-05-29 LAB — CBC WITH DIFFERENTIAL/PLATELET
Basophils Absolute: 0 10*3/uL (ref 0.0–0.1)
Basophils Relative: 0 % (ref 0–1)
Eosinophils Absolute: 0.2 10*3/uL (ref 0.0–0.7)
Eosinophils Relative: 3 % (ref 0–5)
HCT: 37.3 % — ABNORMAL LOW (ref 39.0–52.0)
Hemoglobin: 12.9 g/dL — ABNORMAL LOW (ref 13.0–17.0)
Lymphocytes Relative: 22 % (ref 12–46)
Lymphs Abs: 1.6 10*3/uL (ref 0.7–4.0)
MCH: 30.9 pg (ref 26.0–34.0)
MCHC: 34.6 g/dL (ref 30.0–36.0)
MCV: 89.2 fL (ref 78.0–100.0)
Monocytes Absolute: 0.6 10*3/uL (ref 0.1–1.0)
Monocytes Relative: 9 % (ref 3–12)
Neutro Abs: 4.8 10*3/uL (ref 1.7–7.7)
Neutrophils Relative %: 66 % (ref 43–77)
Platelets: 218 10*3/uL (ref 150–400)
RBC: 4.18 MIL/uL — ABNORMAL LOW (ref 4.22–5.81)
RDW: 12.6 % (ref 11.5–15.5)
WBC: 7.3 10*3/uL (ref 4.0–10.5)

## 2015-05-29 LAB — BASIC METABOLIC PANEL
Anion gap: 7 (ref 5–15)
BUN: 16 mg/dL (ref 6–20)
CO2: 25 mmol/L (ref 22–32)
Calcium: 8.6 mg/dL — ABNORMAL LOW (ref 8.9–10.3)
Chloride: 105 mmol/L (ref 101–111)
Creatinine, Ser: 0.81 mg/dL (ref 0.61–1.24)
GFR calc Af Amer: >60 mL/min (ref >60)
GFR calc non Af Amer: >60 mL/min (ref >60)
Glucose, Bld: 120 mg/dL — ABNORMAL HIGH (ref 65–99)
Potassium: 3.6 mmol/L (ref 3.5–5.1)
Sodium: 137 mmol/L (ref 135–145)

## 2015-05-29 LAB — T4, FREE: Free T4: 0.87 ng/dL (ref 0.61–1.12)

## 2015-05-29 LAB — TROPONIN I: Troponin I: <0.03 ng/mL (ref <0.031)

## 2015-05-29 LAB — TSH: TSH: 1.093 u[IU]/mL (ref 0.350–4.500)

## 2015-05-29 NOTE — ED Provider Notes (Signed)
CSN: [id number]     Arrival date & time 05/29/15  0236 History   First MD Initiated Contact with Patient 05/29/15 0327     No chief complaint on file.    (Consider location/radiation/quality/duration/timing/severity/associated sxs/prior Treatment) HPI Comments: He has been having episodes of symptoms that start as "a rush of heat" that affects him all over, followed by an irregular heartbeat. This has been on and off for some time. He has been put on metoprolol in the past but not currently taking it. Currently he is asymptomatic. He presents for evaluation after he was woken from sleep with symptoms, which tonight included lightheadedness and generalized weakness. When symptoms occur, he states they last about an hour before resolving.   The history is provided by the patient and the spouse. No language interpreter was used.    Past Medical History  Diagnosis Date  . Kidney stone   . Hypertension   . Blind right eye     accident age 26  . 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 History of gastroesophageal reflux (GERD)   . Retinal detachment     right eye caused blindness  . History of anal fissures     Episodic resulting in hematochezia  . Onychomycosis     treated with lamisil in the past  . Chest pain   . Dyspnea on exertion    Past Surgical History  Procedure Laterality Date  . Appendectomy    . Right retina detachment    . Cystoscopy w/ ureteral stent placement  08/24/2012    Procedure: CYSTOSCOPY WITH RETROGRADE PYELOGRAM/URETERAL STENT PLACEMENT;  Surgeon: Ailene Rud, MD;  Location: [facility];  Service: Urology;  Laterality: Right;  . Cystoscopy/retrograde/ureteroscopy  08/24/2012    Procedure: CYSTOSCOPY/RETROGRADE/URETEROSCOPY;  Surgeon: Ailene Rud, MD;  Location: [facility];  Service: Urology;  Laterality: Right;  Stone [place]  . Cystoscopy/retrograde/ureteroscopy/stone extraction with  basket Left 04/11/2013    Procedure: CYSTOSCOPY/RETROGRADE/URETEROSCOPY/STONE EXTRACTION WITH BASKET;  Surgeon: Ailene Rud, MD;  Location: [facility];  Service: Urology;  Laterality: Left;  BASKET STONE EXTRACTION WITH URETEROSCOPE     Family History  Problem Relation Age of Onset  . Diabetes Mother   . Stroke Mother   . Aneurysm Mother   . Heart attack Father   . Hypertension Father   . CAD Father   . Heart disease Father   . Aneurysm Paternal Grandmother   . Heart attack Paternal Grandfather    History  Substance Use Topics  . Smoking status: Former Smoker    Types: Pipe  . Smokeless tobacco: Never Used  . Alcohol Use: Yes     Comment: ocassionally beer or wine    Review of Systems  Constitutional: Negative for fever and chills.  HENT: Negative.   Respiratory: Negative.  Negative for shortness of breath.   Cardiovascular: Positive for palpitations. Negative for chest pain.  Gastrointestinal: Negative.  Negative for nausea and vomiting.  Musculoskeletal: Negative.   Skin: Negative.   Neurological: Positive for light-headedness.      Allergies  Review of patient's allergies indicates no known allergies.  Home Medications   Prior to Admission medications   Medication Sig Start Date End Date Taking? Authorizing Provider  lansoprazole (PREVACID) 30 MG capsule Take 1 capsule (30 mg total) by mouth daily at 12 noon. 05/22/15  Yes Roselee Culver, MD  lisinopril (PRINIVIL,ZESTRIL) 20 MG tablet Take 20  mg by mouth daily. 10/30/14  Yes Historical Provider, MD  PARoxetine (PAXIL) 20 MG tablet Take 1 tablet (20 mg total) by mouth daily. 05/22/15  Yes Roselee Culver, MD  testosterone (ANDROGEL) 50 MG/5GM (1%) GEL Place 5 g onto the skin daily.   Yes Historical Provider, MD  sucralfate (CARAFATE) 1 G tablet 1 tablet 1 hr ac and hs Patient not taking: Reported on 05/25/2015 05/22/15   Roselee Culver, MD   BP 120/89 mmHg  Pulse 71  Temp(Src) 98 F  (36.7 C) (Oral)  Resp 16  SpO2 100% Physical Exam  Constitutional: He appears well-developed and well-nourished.  HENT:  Head: Normocephalic.  Neck: Normal range of motion. Neck supple.  Cardiovascular: Normal rate and regular rhythm.   Pulmonary/Chest: Effort normal and breath sounds normal.  Abdominal: Soft. Bowel sounds are normal. There is no tenderness. There is no rebound and no guarding.  Musculoskeletal: Normal range of motion.  Neurological: He is alert. No cranial nerve deficit.  Skin: Skin is warm and dry. No rash noted.  Psychiatric: He has a normal mood and affect.    ED Course  Procedures (including critical care time) Labs Review Labs Reviewed  CBC WITH DIFFERENTIAL/PLATELET - Abnormal; Notable for the following:    RBC 4.18 (*)    Hemoglobin 12.9 (*)    HCT 37.3 (*)    All other components within normal limits  BASIC METABOLIC PANEL  TROPONIN I  T4, FREE  TSH   Results for orders placed or performed during the hospital encounter of 99/24/26  Basic metabolic panel  Result Value Ref Range   Sodium 137 135 - 145 mmol/L   Potassium 3.6 3.5 - 5.1 mmol/L   Chloride 105 101 - 111 mmol/L   CO2 25 22 - 32 mmol/L   Glucose, Bld 120 (H) 65 - 99 mg/dL   BUN 16 6 - 20 mg/dL   Creatinine, Ser 0.81 0.61 - 1.24 mg/dL   Calcium 8.6 (L) 8.9 - 10.3 mg/dL   GFR calc non Af Amer >60 >60 mL/min   GFR calc Af Amer >60 >60 mL/min   Anion gap 7 5 - 15  CBC with Differential  Result Value Ref Range   WBC 7.3 4.0 - 10.5 K/uL   RBC 4.18 (L) 4.22 - 5.81 MIL/uL   Hemoglobin 12.9 (L) 13.0 - 17.0 g/dL   HCT 37.3 (L) 39.0 - 52.0 %   MCV 89.2 78.0 - 100.0 fL   MCH 30.9 26.0 - 34.0 pg   MCHC 34.6 30.0 - 36.0 g/dL   RDW 12.6 11.5 - 15.5 %   Platelets 218 150 - 400 K/uL   Neutrophils Relative % 66 43 - 77 %   Neutro Abs 4.8 1.7 - 7.7 K/uL   Lymphocytes Relative 22 12 - 46 %   Lymphs Abs 1.6 0.7 - 4.0 K/uL   Monocytes Relative 9 3 - 12 %   Monocytes Absolute 0.6 0.1 - 1.0 K/uL    Eosinophils Relative 3 0 - 5 %   Eosinophils Absolute 0.2 0.0 - 0.7 K/uL   Basophils Relative 0 0 - 1 %   Basophils Absolute 0.0 0.0 - 0.1 K/uL  Troponin I  Result Value Ref Range   Troponin I <0.03 <0.031 ng/mL     Imaging Review Dg Chest 2 View  05/29/2015   CLINICAL DATA:  Sudden onset heart palpitations. Felt like he was going to pass out. Has been happening off and on for several  weeks. High blood pressure.  EXAM: CHEST  2 VIEW  COMPARISON:  09/27/2014  FINDINGS: The heart size and mediastinal contours are within normal limits. Both lungs are clear. The visualized skeletal structures are unremarkable.  IMPRESSION: No active cardiopulmonary disease.   Electronically Signed   By: Lucienne Capers M.D.   On: 05/29/2015 04:31     EKG Interpretation None      MDM   Final diagnoses:  None    1. Palpitation  Follow up with cardiology is recommended for consideration of holter monitoring. Patient continues to be symptom free. Discussed with Dr. Sharol Given. Stable for discharge home.     Charlann Lange, PA-C 05/29/15 [place], MD 05/29/15 [phone]

## 2015-05-29 NOTE — ED Notes (Addendum)
Pt presents for "irregular heart beat" at approximately 0200 this morning with "rush of heat" and lightheadedness. Denies nausea/vomiting, pain, SOB.

## 2015-05-29 NOTE — Discharge Instructions (Signed)
Cardiac Event Monitoring A cardiac event monitor is a small recording device used to help detect abnormal heart rhythms (arrhythmias). The monitor is used to record heart rhythm when noticeable symptoms such as the following occur:  Fast heartbeats (palpitations), such as heart racing or fluttering.  Dizziness.  Fainting or light-headedness.  Unexplained weakness. The monitor is wired to two electrodes placed on your chest. Electrodes are flat, sticky disks that attach to your skin. The monitor can be worn for up to 30 days. You will wear the monitor at all times, except when bathing.  HOW TO USE YOUR CARDIAC EVENT MONITOR A technician will prepare your chest for the electrode placement. The technician will show you how to place the electrodes, how to work the monitor, and how to replace the batteries. Take time to practice using the monitor before you leave the office. Make sure you understand how to send the information from the monitor to your health care provider. This requires a telephone with a landline, not a cell phone. You need to:  Wear your monitor at all times, except when you are in water:  Do not get the monitor wet.  Take the monitor off when bathing. Do not swim or use a hot tub with it on.  Keep your skin clean. Do not put body lotion or moisturizer on your chest.  Change the electrodes daily or any time they stop sticking to your skin. You might need to use tape to keep them on.  It is possible that your skin under the electrodes could become irritated. To keep this from happening, try to put the electrodes in slightly different places on your chest. However, they must remain in the area under your left breast and in the upper right section of your chest.  Make sure the monitor is safely clipped to your clothing or in a location close to your body that your health care provider recommends.  Press the button to record when you feel symptoms of heart trouble, such as  dizziness, weakness, light-headedness, palpitations, thumping, shortness of breath, unexplained weakness, or a fluttering or racing heart. The monitor is always on and records what happened slightly before you pressed the button, so do not worry about being too late to get good information.  Keep a diary of your activities, such as walking, doing chores, and taking medicine. It is especially important to note what you were doing when you pushed the button to record your symptoms. This will help your health care provider determine what might be contributing to your symptoms. The information stored in your monitor will be reviewed by your health care provider alongside your diary entries.  Send the recorded information as recommended by your health care provider. It is important to understand that it will take some time for your health care provider to process the results.  Change the batteries as recommended by your health care provider. SEEK IMMEDIATE MEDICAL CARE IF:   You have chest pain.  You have extreme difficulty breathing or shortness of breath.  You develop a very fast heartbeat that persists.  You develop dizziness that does not go away.  You faint or constantly feel you are about to faint. Document Released: 09/09/2008 Document Revised: 04/17/2014 Document Reviewed: 05/30/2013 [employer] Patient Information 2015 [employer]. This information is not intended to replace advice given to you by your health care provider. Make sure you discuss any questions you have with your health care provider.  

## 2015-05-31 LAB — CULTURE, BLOOD (SINGLE)
Organism ID, Bacteria: NO GROWTH
Organism ID, Bacteria: NO GROWTH

## 2015-06-11 ENCOUNTER — Ambulatory Visit (INDEPENDENT_AMBULATORY_CARE_PROVIDER_SITE_OTHER): Payer: BLUE CROSS/BLUE SHIELD | Admitting: Emergency Medicine

## 2015-06-11 ENCOUNTER — Encounter: Payer: Self-pay | Admitting: Emergency Medicine

## 2015-06-11 VITALS — BP 112/68 | HR 81 | Temp 98.2°F | Resp 18 | Wt 211.0 lb

## 2015-06-11 DIAGNOSIS — R002 Palpitations: Secondary | ICD-10-CM | POA: Diagnosis not present

## 2015-06-11 DIAGNOSIS — R232 Flushing: Secondary | ICD-10-CM

## 2015-06-11 NOTE — Progress Notes (Addendum)
Subjective:  This chart was scribed for Nena Jordan, MD by Thea Alken, ED Scribe. This patient was seen in room 7 and the patient's care was started at 11:00 AM.  Patient ID: Shawn [name], male    DOB: 13-Oct-1961, 54 y.o.   MRN: [id number]  HPI  HPI Comments: Shawn [name] is a 54 y.o. male who presents to the [facility] complaining of progressively worsening, intermittent, palpitation for the past 4-6 months. Pt states he has intermittent episodes of an irregular heart beat as if his heart stops. States episodes occur only in the morning and can last for 15 minutes or for a couple hours. He reports associated light headedness, dizziness, SOB, hot flashes, and feeling flushed. He works for a [employer] and denies his job being stressful or physical. He states he was diagnosed with an irregular heart beat in the past and was taking metoprolol but stop taking medication several years ago. He has had several labs drawn in the past which all resulted as normal. He typically drinks 1 cup of coffee in the morning but denies drinking energy drinks. He has an appointment with Dr. Einar Gip in late July.  Pt denies being diagnosed with anxiety disorder in the past. He denies breaking out in hives and diaphoresis.   Past Medical History  Diagnosis Date  . Kidney stone   . Hypertension   . Blind right eye     accident age 66  . 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 History of gastroesophageal reflux (GERD)   . Retinal detachment     right eye caused blindness  . History of anal fissures     Episodic resulting in hematochezia  . Onychomycosis     treated with lamisil in the past  . Chest pain   . Dyspnea on exertion    Past Surgical History  Procedure Laterality Date  . Appendectomy    . Right retina detachment    . Cystoscopy w/ ureteral stent placement  08/24/2012    Procedure: CYSTOSCOPY WITH  RETROGRADE PYELOGRAM/URETERAL STENT PLACEMENT;  Surgeon: Ailene Rud, MD;  Location: [facility];  Service: Urology;  Laterality: Right;  . Cystoscopy/retrograde/ureteroscopy  08/24/2012    Procedure: CYSTOSCOPY/RETROGRADE/URETEROSCOPY;  Surgeon: Ailene Rud, MD;  Location: [facility];  Service: Urology;  Laterality: Right;  Stone Basketing  . Cystoscopy/retrograde/ureteroscopy/stone extraction with basket Left 04/11/2013    Procedure: CYSTOSCOPY/RETROGRADE/URETEROSCOPY/STONE EXTRACTION WITH BASKET;  Surgeon: Ailene Rud, MD;  Location: [facility];  Service: Urology;  Laterality: Left;  BASKET STONE EXTRACTION WITH URETEROSCOPE     Prior to Admission medications   Medication Sig Start Date End Date Taking? Authorizing Provider  lansoprazole (PREVACID) 30 MG capsule Take 1 capsule (30 mg total) by mouth daily at 12 noon. 05/22/15  Yes Roselee Culver, MD  lisinopril (PRINIVIL,ZESTRIL) 20 MG tablet Take 20 mg by mouth daily. 10/30/14  Yes Historical Provider, MD  PARoxetine (PAXIL) 20 MG tablet Take 1 tablet (20 mg total) by mouth daily. Patient not taking: Reported on 06/11/2015 05/22/15   Roselee Culver, MD  sucralfate (CARAFATE) 1 G tablet 1 tablet 1 hr ac and hs Patient not taking: Reported on 06/11/2015 05/22/15   Roselee Culver, MD  testosterone (ANDROGEL) 50 MG/5GM (1%) GEL Place 5 g onto the skin daily.    Historical Provider, MD   Review of Systems  Constitutional: Negative for  fatigue.  Respiratory: Positive for shortness of breath.   Cardiovascular: Positive for palpitations.  Skin: Negative for rash.  Neurological: Positive for dizziness and light-headedness.  Psychiatric/Behavioral: The patient is not nervous/anxious.    Objective:   Physical Exam CONSTITUTIONAL: Well developed/well nourished HEAD: Normocephalic/atraumatic EYES: EOMI/PERRL ENMT: Mucous membranes moist NECK: supple no meningeal signs SPINE/BACK:entire spine nontender CV:  S1/S2 noted, no murmurs/rubs/gallops noted LUNGS: Lungs are clear to auscultation bilaterally, no apparent distress ABDOMEN: soft, nontender, no rebound or guarding, bowel sounds noted throughout abdomen GU:no cva tenderness NEURO: Pt is awake/alert/appropriate, moves all extremitiesx4.  No facial droop.   EXTREMITIES: pulses normal/equal, full ROM SKIN: warm, color normal catheter without tenderness or swelling PSYCH: no abnormalities of mood noted, alert and oriented to situation; EKG there is an RSR prime in lead V1 but no other abnormality seen Filed Vitals:   06/11/15 1054  BP: 112/68  Pulse: 81  Temp: 98.2 F (36.8 C)  TempSrc: Oral  Resp: 18  Weight: 211 lb (95.709 kg)  SpO2: 99%   Assessment & Plan:  Unclear if these episodes represent a cardiac arrhythmias PSVT versus PVCs. With his feeling of feeling flushed fatigue and feeling as if he will faint these may be panic episodes. Certainly a Holter monitor will help in differentiating what is actually going on. He states these episodes have become more and more frequent and becoming more and more debilitating for him.I personally performed the services described in this documentation, which was scribed in my presence. The recorded information has been reviewed and is accurate. Patient saw Dr. Gwenlyn Found in January. At that time he had a normal echocardiogram and normal myocardial perfusion test. With the symptoms he is having now certainly a Holter would be indicated. Records sent to [facility] and they will work on this referral.  Nena Jordan, MD

## 2015-06-12 ENCOUNTER — Telehealth: Payer: Self-pay | Admitting: Radiology

## 2015-07-30 ENCOUNTER — Encounter: Payer: Self-pay | Admitting: Family Medicine

## 2015-07-30 ENCOUNTER — Ambulatory Visit (INDEPENDENT_AMBULATORY_CARE_PROVIDER_SITE_OTHER): Payer: BLUE CROSS/BLUE SHIELD | Admitting: Family Medicine

## 2015-07-30 VITALS — BP 126/80 | HR 76 | Temp 98.5°F | Resp 16 | Ht 71.5 in | Wt 209.8 lb

## 2015-07-30 DIAGNOSIS — R079 Chest pain, unspecified: Secondary | ICD-10-CM | POA: Diagnosis not present

## 2015-07-30 DIAGNOSIS — E291 Testicular hypofunction: Secondary | ICD-10-CM

## 2015-07-30 LAB — TROPONIN I: Troponin I: 0.01 ng/mL (ref <0.06)

## 2015-07-30 NOTE — Progress Notes (Addendum)
[facility] [address] [place] [address] [phone]  Date:  07/30/2015   Name:  Shawn [name]   DOB:  08/26/1961   MRN:  [id number]  PCP:  Vena Austria, MD    Chief Complaint: Shortness of Breath; Encopresis; Abdominal Pain; Gas; and Nocturia   History of Present Illness:  Shawn [name] is a 54 y.o. very pleasant male patient who presents with the following:  He is here today with multiple concerns.  He has been seen a few times recently, most recently in June by Dr. Everlene Farrier.  At that time he had complaint of palpitations, and was seen by Dr. Einar Gip.   He also describes "a really weird feeling" in his epigastric area that is a "1 or 2 on a scale of 10" that may wake him up at night, and a lot of gas.  He feels as though someone is squeezing his rib cage.  He reports that he last had this squeezing feeling a week or so ago. It is not exertional  In addition to his heart racing and SOB, he has noted some vague trunk/ abd discomfort that is concerning him.  He has been seen at [facility] and has some evaluation/ labs which I cannot view at this time   He sees Dr. Alyson Ingles as his PCP, and has had also seen one of his partners Dr. Kenton Kingfisher with [facility] for GI concerns.  He has noted gas, "irregular bowel movements, sometimes at night."  It sounds like Dr. Kenton Kingfisher started him on align, and some other medication -he cannot recall the name.  He may get up 3-4x to urinate during the night, and may have fecal urgency.  He also notes his stomach gurgling.   He also states that he will wake up with his heart racing.    History of kidney stones and a severe attack with obstruction and SIRS back in 2013 Gaynelle Arabian is his urologist.   Recent labs showed normal renal function, mild anemia.    He had a recent negative nuclear med study, normal echo as well back in January.   Dr. Einar Gip did a holter monitor and changed his BB for what sounds like a benign arrhythmia. Saw him  most recently about 2 weeks ago.   He did have a colonoscopy 3 years ago, and will follow-up to see his GI doctor again with [facility].  He may have another colonoscopy.   He notes a "pulsating feeling of being hot and cold, worst when I get up in the morning."  However he does not have any sweats  Negative CXR in June.   Wt Readings from Last 3 Encounters:  07/30/15 209 lb 12.8 oz (95.165 kg)  06/11/15 211 lb (95.709 kg)  05/25/15 214 lb (97.07 kg)     Patient Active Problem List   Diagnosis Date Noted  . GERD (gastroesophageal reflux disease) 05/22/2015  . Chest pain 12/19/2014  . Dyspnea on exertion 12/19/2014  . Sepsis(995.91) 08/27/2012  . UTI (lower urinary tract infection) 08/26/2012  . Hypotension 08/25/2012  . Hydronephrosis with obstructing calculus 08/25/2012  . SIRS (systemic inflammatory response syndrome) 08/24/2012  . Hyponatremia 08/24/2012  . Nephrolithiasis 08/24/2012  . Hydronephrosis 08/24/2012  . Acute renal failure 08/24/2012  . Leukocytosis 08/24/2012  . Kidney stone   . Hypertension   . Blind right eye     Past Medical History  Diagnosis Date  . Kidney stone   . Hypertension   . Blind  right eye     accident age 23  . 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 History of gastroesophageal reflux (GERD)   . Retinal detachment     right eye caused blindness  . History of anal fissures     Episodic resulting in hematochezia  . Onychomycosis     treated with lamisil in the past  . Chest pain   . Dyspnea on exertion     Past Surgical History  Procedure Laterality Date  . Appendectomy    . Right retina detachment    . Cystoscopy w/ ureteral stent placement  08/24/2012    Procedure: CYSTOSCOPY WITH RETROGRADE PYELOGRAM/URETERAL STENT PLACEMENT;  Surgeon: Ailene Rud, MD;  Location: [facility];  Service: Urology;  Laterality: Right;  . Cystoscopy/retrograde/ureteroscopy  08/24/2012     Procedure: CYSTOSCOPY/RETROGRADE/URETEROSCOPY;  Surgeon: Ailene Rud, MD;  Location: [facility];  Service: Urology;  Laterality: Right;  Stone Basketing  . Cystoscopy/retrograde/ureteroscopy/stone extraction with basket Left 04/11/2013    Procedure: CYSTOSCOPY/RETROGRADE/URETEROSCOPY/STONE EXTRACTION WITH BASKET;  Surgeon: Ailene Rud, MD;  Location: [facility];  Service: Urology;  Laterality: Left;  BASKET STONE EXTRACTION WITH URETEROSCOPE      Social History  Substance Use Topics  . Smoking status: Former Smoker    Types: Pipe  . Smokeless tobacco: Never Used  . Alcohol Use: Yes     Comment: ocassionally beer or wine    Family History  Problem Relation Age of Onset  . Diabetes Mother   . Stroke Mother   . Aneurysm Mother   . Heart attack Father   . Hypertension Father   . CAD Father   . Heart disease Father   . Aneurysm Paternal Grandmother   . Heart attack Paternal Grandfather     No Known Allergies  Medication list has been reviewed and updated.  Current Outpatient Prescriptions on File Prior to Visit  Medication Sig Dispense Refill  . testosterone (ANDROGEL) 50 MG/5GM (1%) GEL Place 5 g onto the skin daily.     No current facility-administered medications on file prior to visit.    Review of Systems:  As per HPI- otherwise negative.   Physical Examination: Filed Vitals:   07/30/15 0830  BP: 126/80  Pulse: 76  Temp: 98.5 F (36.9 C)  Resp: 16   Filed Vitals:   07/30/15 0830  Height: 5' 11.5" (1.816 m)  Weight: 209 lb 12.8 oz (95.165 kg)   Body mass index is 28.86 kg/(m^2). Ideal Body Weight: Weight in (lb) to have BMI = 25: 181.4  GEN: WDWN, NAD, Non-toxic, A & O x 3, looks well HEENT: Atraumatic, Normocephalic. Neck supple. No masses, No LAD. Ears and Nose: No external deformity. CV: RRR, No M/G/R. No JVD. No thrill. No extra heart sounds. PULM: CTA B, no wheezes, crackles, rhonchi. No retractions. No resp. distress.  No accessory muscle use. ABD: S, NT, ND, +BS. No rebound. No HSM. EXTR: No c/c/e NEURO Normal gait.  PSYCH: Normally interactive. Conversant. Not depressed or anxious appearing.  Calm demeanor.   EKG:  SR, no acute change from prior Results for orders placed or performed in visit on 07/30/15  Troponin I  Result Value Ref Range   Troponin I 0.01 <0.06 ng/mL    Assessment and Plan: Chest pain, unspecified chest pain type - Plan: EKG 12-Lead, Troponin I  Hypogonadism in male - Plan: Testosterone, Free, Total, SHBG, CANCELED: Testosterone,  CANCELED: Testosterone, Free, Total, SHBG, CANCELED: Testosterone, Free, Total, SHBG  Here today with a vague sensation of GI discomfort, trunk "squeezing" and occasional heart racing and SOB.  It appears that he has had a thorough cardiac evaluation with echo and myoview in the last year.  Doubt cardiac disease. Will check a troponin today to ensure no sign of AMI.  However I agree that the next best step is to see GI and ensure no organic etiology of his sx can be found.  However did discuss with him that his particular cluster of sx does not seem to point to any particular disease and it is possible that he is suffering from anxiety  Signed Lamar Blinks, MD 8/22-  Called regarding testosterone today but no answer. VM full.  Will send a letter

## 2015-07-30 NOTE — Patient Instructions (Signed)
I will call you if your blood test from today is abnormal- however I expect that it will be normal.  Your EKG appears unchanged which is reassuring  Otherwise I think you should go ahead and see the GI doctor for a follow-up visit.  A colonoscopy is probably the next best step If your GI doctor gives you a clean bill of health, I think we should consider the possibility of an anxiety component

## 2015-07-31 LAB — TESTOSTERONE, FREE, TOTAL, SHBG
Sex Hormone Binding: 18 nmol/L (ref 10–50)
Testosterone, Free: 44.6 pg/mL — ABNORMAL LOW (ref 47.0–244.0)
Testosterone-% Free: 2.6 % (ref 1.6–2.9)
Testosterone: 174 ng/dL — ABNORMAL LOW (ref 300–890)

## 2015-08-06 ENCOUNTER — Encounter: Payer: Self-pay | Admitting: Family Medicine

## 2015-09-03 ENCOUNTER — Ambulatory Visit (INDEPENDENT_AMBULATORY_CARE_PROVIDER_SITE_OTHER): Payer: BLUE CROSS/BLUE SHIELD | Admitting: Emergency Medicine

## 2015-09-03 VITALS — BP 114/84 | HR 69 | Temp 98.5°F | Resp 18 | Ht 71.0 in | Wt 214.0 lb

## 2015-09-03 DIAGNOSIS — R221 Localized swelling, mass and lump, neck: Secondary | ICD-10-CM | POA: Diagnosis not present

## 2015-09-03 LAB — POCT CBC
Granulocyte percent: 68.4 %G (ref 37–80)
HCT, POC: 44 % (ref 43.5–53.7)
Hemoglobin: 14.1 g/dL (ref 14.1–18.1)
Lymph, poc: 2.3 (ref 0.6–3.4)
MCH, POC: 28 pg (ref 27–31.2)
MCHC: 31.9 g/dL (ref 31.8–35.4)
MCV: 87.6 fL (ref 80–97)
MID (cbc): 0.7 (ref 0–0.9)
MPV: 8.3 fL (ref 0–99.8)
POC Granulocyte: 6.4 (ref 2–6.9)
POC LYMPH PERCENT: 24.6 %L (ref 10–50)
POC MID %: 7 %M (ref 0–12)
Platelet Count, POC: 237 10*3/uL (ref 142–424)
RBC: 5.03 M/uL (ref 4.69–6.13)
RDW, POC: 12.6 %
WBC: 9.4 10*3/uL (ref 4.6–10.2)

## 2015-09-03 NOTE — Patient Instructions (Signed)
Place head/neck mass patient instructions here.  

## 2015-09-03 NOTE — Progress Notes (Signed)
Subjective:  Patient ID: Olegario Emberson, male    DOB: October 25, 1961  Age: 55 y.o. MRN: [id number]  CC: Neck Pain   HPI Noland Pizano presents  with a massive left anterior neck. He said it popped up over the last week. He has no fever chills no pain. He has some difficulty swallowing. Has no nasal congestion postnasal drainage sore throat  History Ladarryl has a past medical history of Kidney stone; Hypertension; Blind right eye; Meniere's disease; History of gastroesophageal reflux (GERD); Retinal detachment; History of anal fissures; Onychomycosis; Chest pain; and Dyspnea on exertion.   He has past surgical history that includes Appendectomy; Right retina detachment; Cystoscopy w/ ureteral stent placement (08/24/2012); Cystoscopy/retrograde/ureteroscopy (08/24/2012); and Cystoscopy/retrograde/ureteroscopy/stone extraction with basket (Left, 04/11/2013).   His  family history includes Aneurysm in his mother and paternal grandmother; CAD in his father; Diabetes in his mother; Heart attack in his father and paternal grandfather; Heart disease in his father; Hypertension in his father; Stroke in his mother.  He   reports that he has quit smoking. His smoking use included Pipe. He has never used smokeless tobacco. He reports that he drinks alcohol. He reports that he does not use illicit drugs.  Outpatient Prescriptions Prior to Visit  Medication Sig Dispense Refill  . metoprolol succinate (TOPROL-XL) 25 MG 24 hr tablet Take 25 mg by mouth daily.    Marland Kitchen testosterone (ANDROGEL) 50 MG/5GM (1%) GEL Place 5 g onto the skin daily.     No facility-administered medications prior to visit.    Social History   Social History  . Marital Status: Married    Spouse Name: N/A  . Number of Children: N/A  . Years of Education: N/A   Social History Main Topics  . Smoking status: Former Smoker    Types: Pipe  . Smokeless tobacco: Never Used  . Alcohol Use: Yes     Comment: ocassionally beer or wine  .  Drug Use: No  . Sexual Activity: No   Other Topics Concern  . None   Social History Narrative     Review of Systems  Constitutional: Negative for fever, chills and appetite change.  HENT: Negative for congestion, ear pain, postnasal drip, sinus pressure and sore throat.   Eyes: Negative for pain and redness.  Respiratory: Negative for cough, shortness of breath and wheezing.   Cardiovascular: Negative for leg swelling.  Gastrointestinal: Negative for nausea, vomiting, abdominal pain, diarrhea, constipation and blood in stool.  Endocrine: Negative for polyuria.  Genitourinary: Negative for dysuria, urgency, frequency and flank pain.  Musculoskeletal: Negative for gait problem.  Skin: Negative for rash.  Neurological: Negative for weakness and headaches.  Psychiatric/Behavioral: Negative for confusion and decreased concentration. The patient is not nervous/anxious.     Objective:  BP 114/84 mmHg  Pulse 69  Temp(Src) 98.5 F (36.9 C) (Oral)  Resp 18  Ht 5\' 11"  (1.803 m)  Wt 214 lb (97.07 kg)  BMI 29.86 kg/m2  SpO2 98%  Physical Exam  Constitutional: He is oriented to person, place, and time. He appears well-developed and well-nourished.  HENT:  Head: Normocephalic and atraumatic.  Eyes: Conjunctivae are normal. Pupils are equal, round, and reactive to light.  Neck: Neck supple. No thyroid mass and no thyromegaly present.  He has a mass in the left anterior neck anterior to the sternocleidomastoid. Soft and nontender is larger than the 2.5 cm doesn't appear to be contiguous with the  Pulmonary/Chest: Effort normal.  Musculoskeletal: He exhibits no edema.  Neurological: He is alert and oriented to person, place, and time.  Skin: Skin is dry.  Psychiatric: He has a normal mood and affect. His behavior is normal. Thought content normal.      Assessment & Plan:   Javyn was seen today for neck pain.  Diagnoses and all orders for this visit:  Neck mass -     [place] Soft  Tissue Head/Neck; Future -     POCT CBC -     TSH -     Comprehensive metabolic panel   I am having Mr. Lemen maintain his testosterone and metoprolol succinate.  No orders of the defined types were placed in this encounter.    Appropriate red flag conditions were discussed with the patient as well as actions that should be taken.  Patient expressed his understanding.  Follow-up: Return if symptoms worsen or fail to improve.  Roselee Culver, MD

## 2015-09-04 ENCOUNTER — Ambulatory Visit (HOSPITAL_COMMUNITY)
Admission: RE | Admit: 2015-09-04 | Discharge: 2015-09-04 | Disposition: A | Discharge status: Home / Self Care | Payer: BLUE CROSS/BLUE SHIELD | Source: Ambulatory Visit | Attending: Emergency Medicine | Admitting: Emergency Medicine

## 2015-09-04 DIAGNOSIS — E042 Nontoxic multinodular goiter: Secondary | ICD-10-CM | POA: Diagnosis not present

## 2015-09-04 DIAGNOSIS — R221 Localized swelling, mass and lump, neck: Secondary | ICD-10-CM

## 2015-09-04 LAB — TSH: TSH: 1.133 u[IU]/mL (ref 0.350–4.500)

## 2015-09-04 LAB — COMPREHENSIVE METABOLIC PANEL
ALT: 21 U/L (ref 9–46)
AST: 17 U/L (ref 10–35)
Albumin: 4.7 g/dL (ref 3.6–5.1)
Alkaline Phosphatase: 42 U/L (ref 40–115)
BUN: 12 mg/dL (ref 7–25)
CO2: 26 mmol/L (ref 20–31)
Calcium: 9.4 mg/dL (ref 8.6–10.3)
Chloride: 103 mmol/L (ref 98–110)
Creat: 0.92 mg/dL (ref 0.70–1.33)
Glucose, Bld: 83 mg/dL (ref 65–99)
Potassium: 4.3 mmol/L (ref 3.5–5.3)
Sodium: 140 mmol/L (ref 135–146)
Total Bilirubin: 0.8 mg/dL (ref 0.2–1.2)
Total Protein: 7 g/dL (ref 6.1–8.1)

## 2015-09-05 ENCOUNTER — Other Ambulatory Visit: Payer: Self-pay | Admitting: Emergency Medicine

## 2015-09-05 DIAGNOSIS — E041 Nontoxic single thyroid nodule: Secondary | ICD-10-CM

## 2015-09-12 ENCOUNTER — Other Ambulatory Visit (HOSPITAL_COMMUNITY)
Admission: RE | Admit: 2015-09-12 | Discharge: 2015-09-12 | Disposition: A | Discharge status: Home / Self Care | Payer: BLUE CROSS/BLUE SHIELD | Source: Ambulatory Visit | Attending: Radiology | Admitting: Radiology

## 2015-09-12 ENCOUNTER — Ambulatory Visit
Admission: RE | Admit: 2015-09-12 | Discharge: 2015-09-12 | Disposition: A | Discharge status: Home / Self Care | Payer: BLUE CROSS/BLUE SHIELD | Source: Ambulatory Visit | Attending: Emergency Medicine | Admitting: Emergency Medicine

## 2015-09-12 ENCOUNTER — Other Ambulatory Visit: Payer: Self-pay | Admitting: Emergency Medicine

## 2015-09-12 ENCOUNTER — Inpatient Hospital Stay: Admission: RE | Admit: 2015-09-12 | Payer: BLUE CROSS/BLUE SHIELD | Source: Ambulatory Visit

## 2015-09-12 DIAGNOSIS — E041 Nontoxic single thyroid nodule: Secondary | ICD-10-CM

## 2015-09-19 ENCOUNTER — Telehealth: Payer: Self-pay

## 2015-09-19 NOTE — Telephone Encounter (Signed)
Pt calling about labs. Please review. Thanks  

## 2015-09-21 ENCOUNTER — Telehealth: Payer: Self-pay

## 2015-09-21 NOTE — Telephone Encounter (Signed)
Pt called concerned about an imaging report sent to Dr. Ouida Sills on 9/28 about his Thyroid nodule. He is very concerned. Please advise at [phone]

## 2015-09-24 ENCOUNTER — Telehealth: Payer: Self-pay | Admitting: Radiology

## 2015-09-24 DIAGNOSIS — E041 Nontoxic single thyroid nodule: Secondary | ICD-10-CM

## 2015-09-24 NOTE — Telephone Encounter (Signed)
Spoke with pt, advised results. Pt understood and will await ENT referral.

## 2015-09-24 NOTE — Telephone Encounter (Signed)
Called pt, unable to leave VM

## 2015-09-24 NOTE — Telephone Encounter (Signed)
Please review thyroid biopsy in lab result for pt please.

## 2015-09-24 NOTE — Telephone Encounter (Signed)
Benign nodule.  Have referred to endocrinology

## 2015-09-24 NOTE — Telephone Encounter (Signed)
A message already sent to Dr. Ouida Sills about this.

## 2015-09-25 NOTE — Telephone Encounter (Signed)
See other phone messages.  

## 2015-10-03 ENCOUNTER — Ambulatory Visit (INDEPENDENT_AMBULATORY_CARE_PROVIDER_SITE_OTHER): Payer: BLUE CROSS/BLUE SHIELD | Admitting: Endocrinology

## 2015-10-03 ENCOUNTER — Encounter: Payer: Self-pay | Admitting: Endocrinology

## 2015-10-03 VITALS — BP 138/90 | HR 87 | Temp 98.1°F | Ht 72.5 in | Wt 215.0 lb

## 2015-10-03 DIAGNOSIS — E042 Nontoxic multinodular goiter: Secondary | ICD-10-CM

## 2015-10-03 NOTE — Progress Notes (Signed)
Subjective:    Patient ID: Shawn [name], male    DOB: 08/31/1961, 54 y.o.   MRN: [id number]  HPI Pt was noted to have a nodular thyroid in early 2016.  He is unaware of ever having had thyroid problems in the past.  He reported slight pain at the left anterior neck, and assoc swelling.  He has no h/o XRT or surgery to the neck.    Past Medical History  Diagnosis Date  . Kidney stone   . Hypertension   . Blind right eye     accident age 68  . 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 History of gastroesophageal reflux (GERD)   . Retinal detachment     right eye caused blindness  . History of anal fissures     Episodic resulting in hematochezia  . Onychomycosis     treated with lamisil in the past  . Chest pain   . Dyspnea on exertion     Past Surgical History  Procedure Laterality Date  . Appendectomy    . Right retina detachment    . Cystoscopy w/ ureteral stent placement  08/24/2012    Procedure: CYSTOSCOPY WITH RETROGRADE PYELOGRAM/URETERAL STENT PLACEMENT;  Surgeon: Ailene Rud, MD;  Location: [facility];  Service: Urology;  Laterality: Right;  . Cystoscopy/retrograde/ureteroscopy  08/24/2012    Procedure: CYSTOSCOPY/RETROGRADE/URETEROSCOPY;  Surgeon: Ailene Rud, MD;  Location: [facility];  Service: Urology;  Laterality: Right;  Stone Basketing  . Cystoscopy/retrograde/ureteroscopy/stone extraction with basket Left 04/11/2013    Procedure: CYSTOSCOPY/RETROGRADE/URETEROSCOPY/STONE EXTRACTION WITH BASKET;  Surgeon: Ailene Rud, MD;  Location: [facility];  Service: Urology;  Laterality: Left;  BASKET STONE EXTRACTION WITH URETEROSCOPE      Social History   Social History  . Marital Status: Married    Spouse Name: N/A  . Number of Children: N/A  . Years of Education: N/A   Occupational History  . Not on file.   Social History Main Topics  . Smoking status: Former Smoker      Types: Pipe  . Smokeless tobacco: Never Used  . Alcohol Use: Yes     Comment: ocassionally beer or wine  . Drug Use: No  . Sexual Activity: No   Other Topics Concern  . Not on file   Social History Narrative    Current Outpatient Prescriptions on File Prior to Visit  Medication Sig Dispense Refill  . metoprolol succinate (TOPROL-XL) 25 MG 24 hr tablet Take 25 mg by mouth daily.    Marland Kitchen testosterone (ANDROGEL) 50 MG/5GM (1%) GEL Place 5 g onto the skin daily.     No current facility-administered medications on file prior to visit.    No Known Allergies  Family History  Problem Relation Age of Onset  . Diabetes Mother   . Stroke Mother   . Aneurysm Mother   . Heart attack Father   . Hypertension Father   . CAD Father   . Heart disease Father   . Aneurysm Paternal Grandmother   . Heart attack Paternal Grandfather   . Thyroid disease Neg Hx     BP 138/90 mmHg  Pulse 87  Temp(Src) 98.1 F (36.7 C) (Oral)  Ht 6' 0.5" (1.842 m)  Wt 215 lb (97.523 kg)  BMI 28.74 kg/m2  SpO2 97%   Review of Systems Denies weight change, hoarseness, visual loss, chest pain, sob, dysphagia, excessive diaphoresis, easy bruising, numbness,  and rhinorrhea.  He reports insomnia, flatulence, intermittent headache, anxiety, heat intolerance, and urinary frequency.      Objective:   Physical Exam VS: see vs page GEN: no distress HEAD: head: no deformity eyes: no periorbital swelling, no proptosis external nose and ears are normal mouth: no lesion seen NECK: multinodular goiter is noted, with the 4 cm LLP nodule easily palpable.   CHEST WALL: no deformity LUNGS: clear to auscultation BREASTS:  No gynecomastia CV: reg rate and rhythm, no murmur ABD: abdomen is soft, nontender.  no hepatosplenomegaly.  not distended.  no hernia MUSCULOSKELETAL: muscle bulk and strength are grossly normal.  no obvious joint swelling.  gait is normal and steady EXTEMITIES: no deformity.  no edema PULSES:  no carotid bruit NEURO:  cn 2-12 grossly intact.   readily moves all 4's.  sensation is intact to touch on all 4's.  No tremor SKIN:  Normal texture and temperature.  No rash or suspicious lesion is visible.   NODES:  None palpable at the neck. PSYCH: alert, well-oriented.  Does not appear anxious nor depressed.    Thyroid bx: BENIGN FOLLICULAR NODULE (BETHESDA CATEGORY II).  Lab Results  Component Value Date   TSH 1.133 09/03/2015   Radiol: thyroid US (09/04/15): Multiple bilateral thyroid nodules, the largest nodule is 4.2 cm in the left mid to lower pole.    I have reviewed outside records, and summarized: Pt was noted to have thyroid nodule, and ref here.      Assessment & Plan:  Multinodular goiter, new to me.  bx is low-risk.    Patient is advised the following: Patient Instructions  most of the time, a "lumpy thyroid" will eventually become overactive.  this is usually a slow process, happening over the span of many years.  Therefore, you should have your thyroid blood tests once per year.  You should have the ultrasound rechecked in approx 1 year.  Either Dr Alyson Ingles or I would be happy to do this. As your thyroid blood level is normal, please continue to work with Dr Alyson Ingles on your symptoms.

## 2015-10-03 NOTE — Patient Instructions (Signed)
most of the time, a "lumpy thyroid" will eventually become overactive.  this is usually a slow process, happening over the span of many years.  Therefore, you should have your thyroid blood tests once per year.  You should have the ultrasound rechecked in approx 1 year.  Either Dr Alyson Ingles or I would be happy to do this. As your thyroid blood level is normal, please continue to work with Dr Alyson Ingles on your symptoms.

## 2015-10-04 DIAGNOSIS — E042 Nontoxic multinodular goiter: Secondary | ICD-10-CM | POA: Insufficient documentation

## 2016-02-11 ENCOUNTER — Ambulatory Visit (INDEPENDENT_AMBULATORY_CARE_PROVIDER_SITE_OTHER): Payer: BLUE CROSS/BLUE SHIELD | Admitting: Physician Assistant

## 2016-02-11 VITALS — BP 140/96 | HR 66 | Temp 98.0°F | Resp 16 | Ht 72.5 in | Wt 222.0 lb

## 2016-02-11 DIAGNOSIS — R000 Tachycardia, unspecified: Secondary | ICD-10-CM

## 2016-02-11 DIAGNOSIS — R002 Palpitations: Secondary | ICD-10-CM | POA: Insufficient documentation

## 2016-02-11 DIAGNOSIS — R350 Frequency of micturition: Secondary | ICD-10-CM

## 2016-02-11 LAB — POCT CBC
Granulocyte percent: 77.6 %G (ref 37–80)
HCT, POC: 42.7 % — AB (ref 43.5–53.7)
Hemoglobin: 15.5 g/dL (ref 14.1–18.1)
Lymph, poc: 1.4 (ref 0.6–3.4)
MCH, POC: 32.2 pg — AB (ref 27–31.2)
MCHC: 36.3 g/dL — AB (ref 31.8–35.4)
MCV: 88.8 fL (ref 80–97)
MID (cbc): 0.3 (ref 0–0.9)
MPV: 8.3 fL (ref 0–99.8)
POC Granulocyte: 5.9 (ref 2–6.9)
POC LYMPH PERCENT: 17.8 %L (ref 10–50)
POC MID %: 4.6 %M (ref 0–12)
Platelet Count, POC: 193 10*3/uL (ref 142–424)
RBC: 4.8 M/uL (ref 4.69–6.13)
RDW, POC: 12 %
WBC: 7.6 10*3/uL (ref 4.6–10.2)

## 2016-02-11 LAB — COMPREHENSIVE METABOLIC PANEL
ALT: 25 U/L (ref 9–46)
AST: 20 U/L (ref 10–35)
Albumin: 4.6 g/dL (ref 3.6–5.1)
Alkaline Phosphatase: 41 U/L (ref 40–115)
BUN: 13 mg/dL (ref 7–25)
CO2: 26 mmol/L (ref 20–31)
Calcium: 9.2 mg/dL (ref 8.6–10.3)
Chloride: 103 mmol/L (ref 98–110)
Creat: 0.98 mg/dL (ref 0.70–1.33)
Glucose, Bld: 122 mg/dL — ABNORMAL HIGH (ref 65–99)
Potassium: 4.8 mmol/L (ref 3.5–5.3)
Sodium: 138 mmol/L (ref 135–146)
Total Bilirubin: 0.8 mg/dL (ref 0.2–1.2)
Total Protein: 7.1 g/dL (ref 6.1–8.1)

## 2016-02-11 LAB — POCT URINALYSIS DIP (MANUAL ENTRY)
Bilirubin, UA: NEGATIVE
Blood, UA: NEGATIVE
Glucose, UA: NEGATIVE
Ketones, POC UA: NEGATIVE
Leukocytes, UA: NEGATIVE
Nitrite, UA: NEGATIVE
Protein Ur, POC: NEGATIVE
Spec Grav, UA: 1.015
Urobilinogen, UA: 0.2
pH, UA: 6

## 2016-02-11 LAB — POC MICROSCOPIC URINALYSIS (UMFC): Mucus: ABSENT

## 2016-02-11 LAB — GLUCOSE, POCT (MANUAL RESULT ENTRY): POC Glucose: 124 mg/dl — AB (ref 70–99)

## 2016-02-11 NOTE — Progress Notes (Signed)
[facility] [address], [place] [address] [phone]  Date:  02/11/2016   Name:  Shawn [name]   DOB:  December 13, 1961   MRN:  [id number]  PCP:  Vena Austria, MD    Chief Complaint: Urinary Frequency; Tachycardia; Chest Pain; and Chills   History of Present Illness:  This is a 55 y.o. male with PMH nephrolithiasis, UTI, HTN, GERD who is presenting with rapid heart rate and chest pain that started last night. States he gets these intermittent episodes x 7-8 months. He has had extensive cardiology work up by Dr. Nadyne Coombes and was diagnosed with ectopic atrial fib. States heart rate during episode was >100 bpm x 5-6 hours. Took pulse only once and was 112 but thinks it was higher at other times. Associated cp and sob. CP located to upper chest and lower neck. Hot and cold flashes. Occ dizziness. At hour 5 of episode he went to ED but states by the time he got there, his pulse was in the 80s so went home. Symptoms are resolved at this point. Pulse 66 now. He does endorse drinking a cup of regular coffee at 7:30 last night night, 2.5 hours prior to symptom onset. He generally drinks two cups of coffee a day, in the AM. Will occ have a cup of decaf after dinner. Last night had regular accidentally. Was placed on metoprolol 4 months ago. This reduced frequency of episodes. Double dose a few months ago which has decreased frequency - he is unsure of dose, thinks 25 mg XR BID. States this is the 4th time in the past 6 months that he has had one of these episodes. Always happens at night and wakes him up. Symptoms currently are the same as prior but this is one of longest episodes.   He is also complaining of urinary frequency and chills that started 12 hours ago. States he was urinating large volumes every 30 minutes. This lasted 10 hours. No dysuria or abdominal pain. No hematuria. Starting to slow down now. He did drink 32 oz water throughout the course of the night. This has  happened before. He has mentioned to his PCP, Dr. Alyson Ingles. Had prostate exam 2 months ago by Dr. Alyson Ingles and he states that was normal. Pt does have a history of kidney stones but states this feels nothing like that.  Pt has a history of left sided thyroid nodule. Has undergone aspiration. He states his PCP is "watching this".  Review of Systems:  Review of Systems See HPI  Patient Active Problem List   Diagnosis Date Noted  . Palpitations 02/11/2016  . Multinodular goiter 10/04/2015  . GERD (gastroesophageal reflux disease) 05/22/2015  . Chest pain 12/19/2014  . Dyspnea on exertion 12/19/2014  . Sepsis(995.91) 08/27/2012  . UTI (lower urinary tract infection) 08/26/2012  . Hydronephrosis with obstructing calculus 08/25/2012  . Hyponatremia 08/24/2012  . Nephrolithiasis 08/24/2012  . Acute renal failure ([place]) 08/24/2012  . Leukocytosis 08/24/2012  . Hypertension   . Blind right eye     Prior to Admission medications   Medication Sig Start Date End Date Taking? Authorizing Provider  aspirin 81 MG tablet Take 81 mg by mouth daily.   Yes Historical Provider, MD  metoprolol succinate (TOPROL-XL) 25 MG 24 hr tablet Take 25 mg by mouth daily.   Yes Historical Provider, MD  testosterone (ANDROGEL) 50 MG/5GM (1%) GEL Place 5 g onto the skin daily.   Yes Historical Provider, MD  No Known Allergies  Past Surgical History  Procedure Laterality Date  . Appendectomy    . Right retina detachment    . Cystoscopy w/ ureteral stent placement  08/24/2012    Procedure: CYSTOSCOPY WITH RETROGRADE PYELOGRAM/URETERAL STENT PLACEMENT;  Surgeon: Ailene Rud, MD;  Location: [facility];  Service: Urology;  Laterality: Right;  . Cystoscopy/retrograde/ureteroscopy  08/24/2012    Procedure: CYSTOSCOPY/RETROGRADE/URETEROSCOPY;  Surgeon: Ailene Rud, MD;  Location: [facility];  Service: Urology;  Laterality: Right;  Stone Basketing  . Cystoscopy/retrograde/ureteroscopy/stone extraction with basket  Left 04/11/2013    Procedure: CYSTOSCOPY/RETROGRADE/URETEROSCOPY/STONE EXTRACTION WITH BASKET;  Surgeon: Ailene Rud, MD;  Location: [facility];  Service: Urology;  Laterality: Left;  BASKET STONE EXTRACTION WITH URETEROSCOPE      Social History  Substance Use Topics  . Smoking status: Former Smoker    Types: Pipe  . Smokeless tobacco: Never Used  . Alcohol Use: Yes     Comment: ocassionally beer or wine    Family History  Problem Relation Age of Onset  . Diabetes Mother   . Stroke Mother   . Aneurysm Mother   . Heart attack Father   . Hypertension Father   . CAD Father   . Heart disease Father   . Aneurysm Paternal Grandmother   . Heart attack Paternal Grandfather   . Thyroid disease Neg Hx     Medication list has been reviewed and updated.  Physical Examination:  Physical Exam  Constitutional: He is oriented to person, place, and time. He appears well-developed and well-nourished. No distress.  HENT:  Head: Normocephalic and atraumatic.  Right Ear: Hearing normal.  Left Ear: Hearing normal.  Nose: Nose normal.  Mouth/Throat: Uvula is midline, oropharynx is clear and moist and mucous membranes are normal.  Eyes: Conjunctivae and lids are normal. Pupils are equal, round, and reactive to light. Right eye exhibits no discharge. Left eye exhibits no discharge. No scleral icterus.  Neck: Trachea normal. Carotid bruit is not present. Thyromegaly (left sided) present.  Cardiovascular: Normal rate, regular rhythm, normal heart sounds and normal pulses.   No murmur heard. Pulmonary/Chest: Effort normal and breath sounds normal. No respiratory distress. He has no wheezes. He has no rhonchi. He has no rales.  Abdominal: Soft. Normal appearance. There is no tenderness. There is no CVA tenderness.  Musculoskeletal: Normal range of motion.  Lymphadenopathy:       Head (right side): No submental, no submandibular and no tonsillar adenopathy present.        Head (left side): No submental, no submandibular and no tonsillar adenopathy present.    He has no cervical adenopathy.  Neurological: He is alert and oriented to person, place, and time.  Skin: Skin is warm, dry and intact. No lesion and no rash noted.  Psychiatric: He has a normal mood and affect. His speech is normal and behavior is normal. Thought content normal.   BP 140/96 mmHg  Pulse 66  Temp(Src) 98 F (36.7 C) (Oral)  Resp 16  Ht 6' 0.5" (1.842 m)  Wt 222 lb (100.699 kg)  BMI 29.68 kg/m2  SpO2 99%   EKG interpreted with Dr. Tamala Julian: NSR  Results for orders placed or performed in visit on 02/11/16  POCT Microscopic Urinalysis ([facility])  Result Value Ref Range   WBC,UR,HPF,POC None None WBC/hpf   RBC,UR,HPF,POC None None RBC/hpf   Bacteria None None, Too numerous to count   Mucus Absent Absent   Epithelial Cells, UR Per Microscopy None  None, Too numerous to count cells/hpf  POCT glucose (manual entry)  Result Value Ref Range   POC Glucose 124 (A) 70 - 99 mg/dl  POCT CBC  Result Value Ref Range   WBC 7.6 4.6 - 10.2 K/uL   Lymph, poc 1.4 0.6 - 3.4   POC LYMPH PERCENT 17.8 10 - 50 %L   MID (cbc) 0.3 0 - 0.9   POC MID % 4.6 0 - 12 %M   POC Granulocyte 5.9 2 - 6.9   Granulocyte percent 77.6 37 - 80 %G   RBC 4.80 4.69 - 6.13 M/uL   Hemoglobin 15.5 14.1 - 18.1 g/dL   HCT, POC 42.7 (A) 43.5 - 53.7 %   MCV 88.8 80 - 97 fL   MCH, POC 32.2 (A) 27 - 31.2 pg   MCHC 36.3 (A) 31.8 - 35.4 g/dL   RDW, POC 12.0 %   Platelet Count, POC 193 142 - 424 K/uL   MPV 8.3 0 - 99.8 fL  POCT urinalysis dipstick  Result Value Ref Range   Color, UA yellow yellow   Clarity, UA clear clear   Glucose, UA negative negative   Bilirubin, UA negative negative   Ketones, POC UA negative negative   Spec Grav, UA 1.015    Blood, UA negative negative   pH, UA 6.0    Protein Ur, POC negative negative   Urobilinogen, UA 0.2    Nitrite, UA Negative Negative   Leukocytes, UA Negative Negative    Assessment and Plan:  1. Rapid heart beat 2. palpitations EKG NSR. Pulse normal now. Symptoms have resolved. TSH and CMP pending. CBC normal. Advised he call Dr. Mila Palmer office and make appt for follow up. - EKG 12-Lead - Comprehensive metabolic panel - TSH  3. Urinary frequency UA and CBC normal. Glucose 124. A1C and PSA pending. Follow up with PCP, Dr. Alyson Ingles. - POCT Microscopic Urinalysis ([facility]) - POCT glucose (manual entry) - POCT CBC - POCT urinalysis dipstick - PSA - Hemoglobin A1c   Benjaman Pott. Drenda Freeze, MHS [facility]  02/11/2016

## 2016-02-11 NOTE — Patient Instructions (Signed)
I will call you with lab results. Please call Dr. Nadyne Coombes and make appt this week. Follow up with Dr. Alyson Ingles

## 2016-02-12 LAB — TSH: TSH: 0.9 mIU/L (ref 0.40–4.50)

## 2016-02-12 LAB — HEMOGLOBIN A1C
Hgb A1c MFr Bld: 5.8 % — ABNORMAL HIGH (ref <5.7)
Mean Plasma Glucose: 120 mg/dL — ABNORMAL HIGH (ref <117)

## 2016-02-12 LAB — PSA: PSA: 0.49 ng/mL (ref <=4.00)

## 2016-04-17 DIAGNOSIS — Z0000 Encounter for general adult medical examination without abnormal findings: Secondary | ICD-10-CM | POA: Diagnosis not present

## 2016-04-17 DIAGNOSIS — I10 Essential (primary) hypertension: Secondary | ICD-10-CM | POA: Diagnosis not present

## 2016-04-17 DIAGNOSIS — E291 Testicular hypofunction: Secondary | ICD-10-CM | POA: Diagnosis not present

## 2016-04-17 DIAGNOSIS — Z23 Encounter for immunization: Secondary | ICD-10-CM | POA: Diagnosis not present

## 2016-06-03 DIAGNOSIS — E291 Testicular hypofunction: Secondary | ICD-10-CM | POA: Diagnosis not present

## 2016-06-03 IMAGING — CT CT ABD-PELV W/O CM
2 of 4 series · 13 of 36 positions shown, 19 images · non-contrast
Comparison: 04/12/2013

CLINICAL DATA: Intermittent left flank pain starting [DATE]

EXAM:
CT ABDOMEN AND PELVIS WITHOUT CONTRAST
TECHNIQUE: Multidetector CT imaging of the abdomen and pelvis was performed
following the standard protocol without IV contrast.

[Series 601: coronal body · coronal · 0.94mm/px · 1 of 115 slices shown, 2 images]
[im 39/115  soft-tissue]
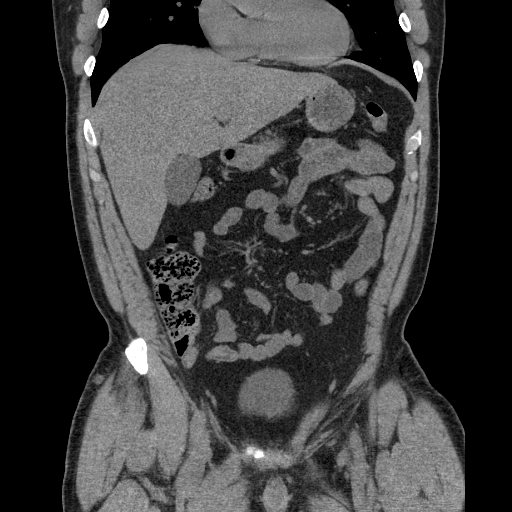
[im 39/115  bone]
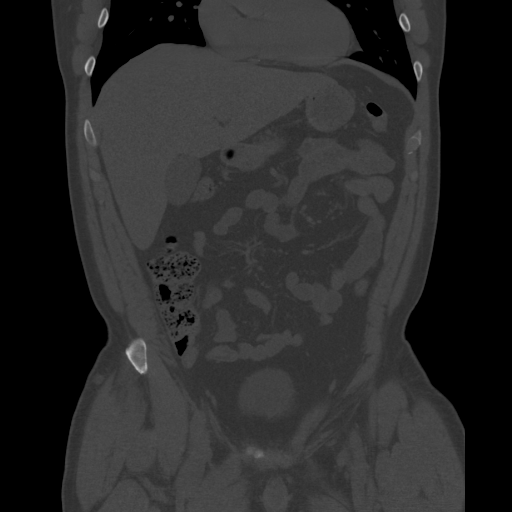

[Series 602: sagittal body · sagittal · 0.94mm/px · 12 of 161 slices shown, 17 images]
[im 10/161  soft-tissue]
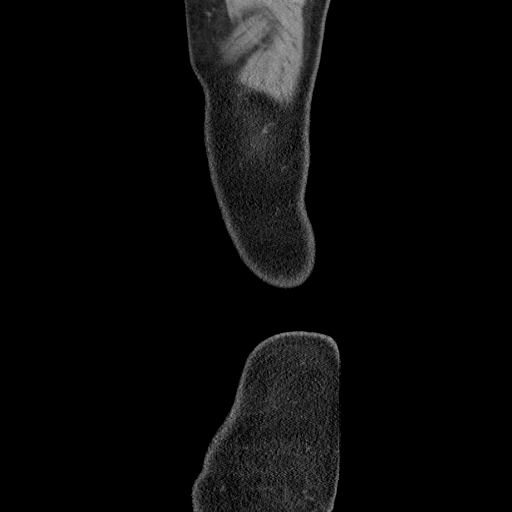
[im 10/161  lung]
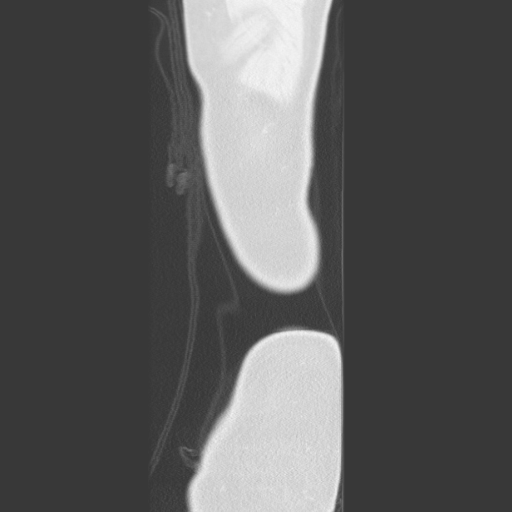
[im 10/161  bone]
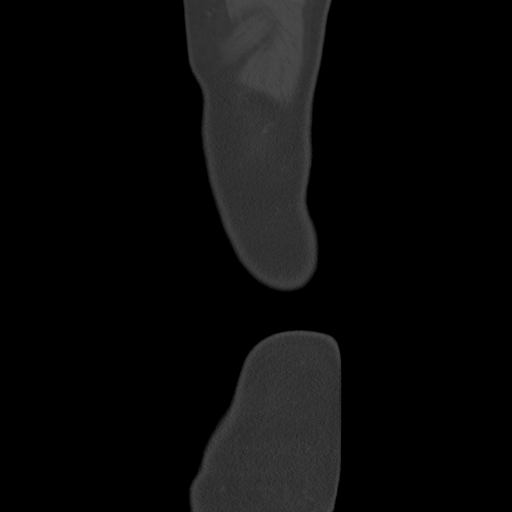
[im 19/161  lung]
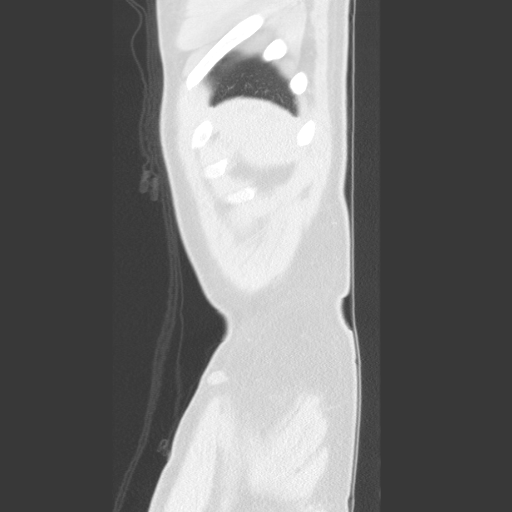
[im 29/161  soft-tissue]
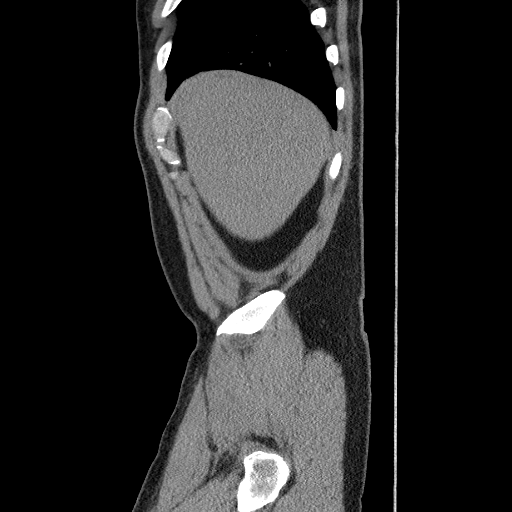
[im 29/161  lung]
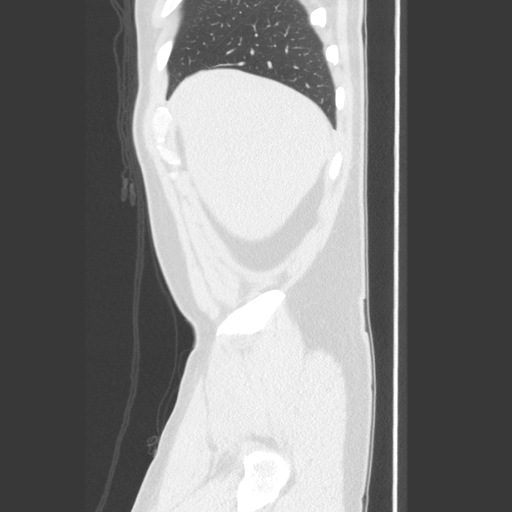
[im 38/161  soft-tissue]
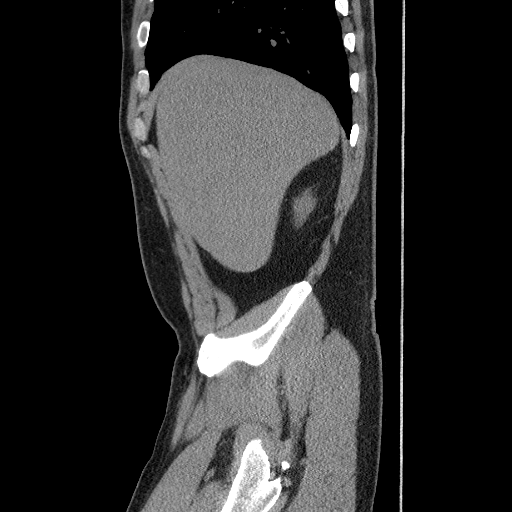
[im 38/161  lung]
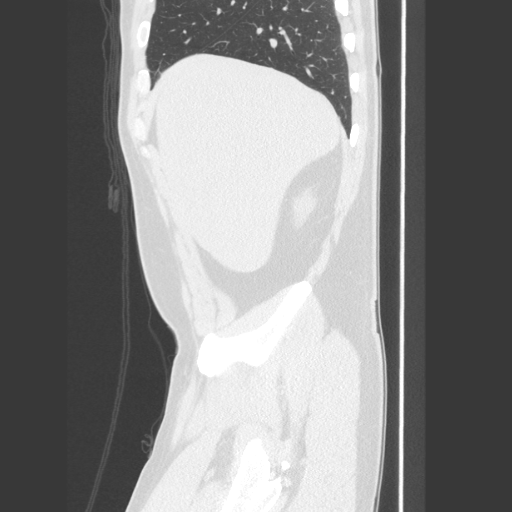
[im 57/161  soft-tissue]
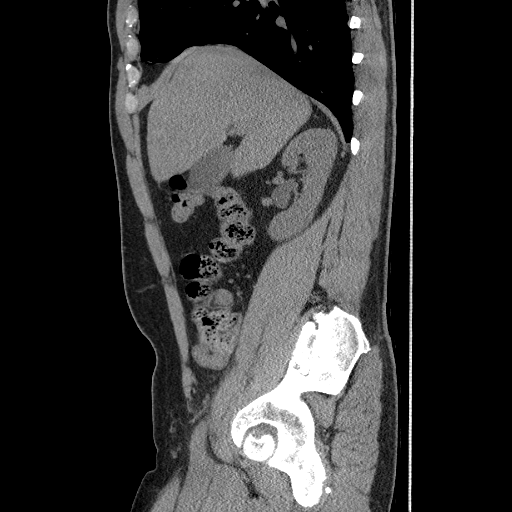
[im 66/161  soft-tissue]
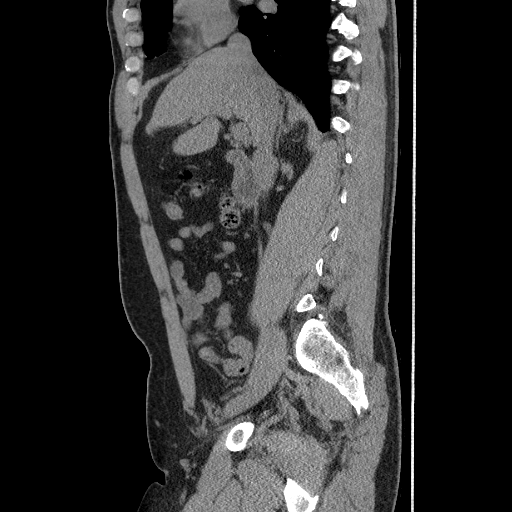
[im 85/161  soft-tissue]
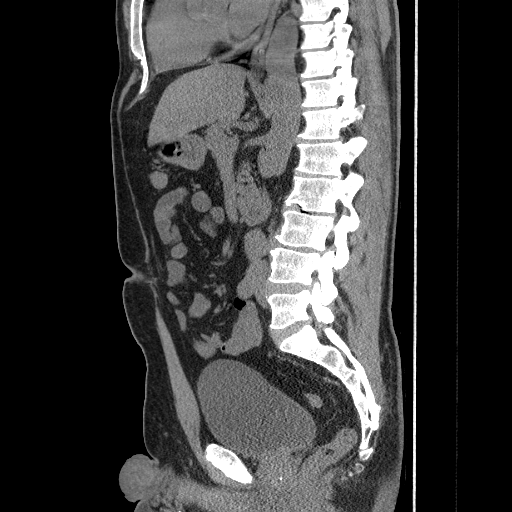
[im 95/161  soft-tissue]
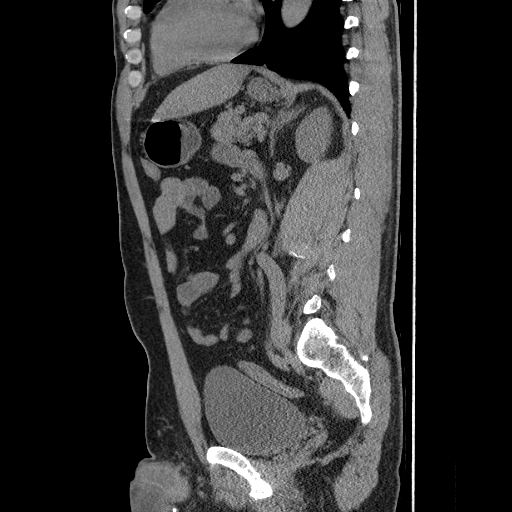
[im 104/161  soft-tissue]
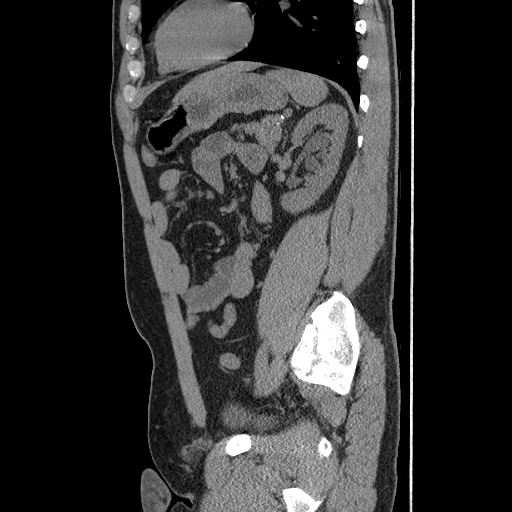
[im 123/161  soft-tissue]
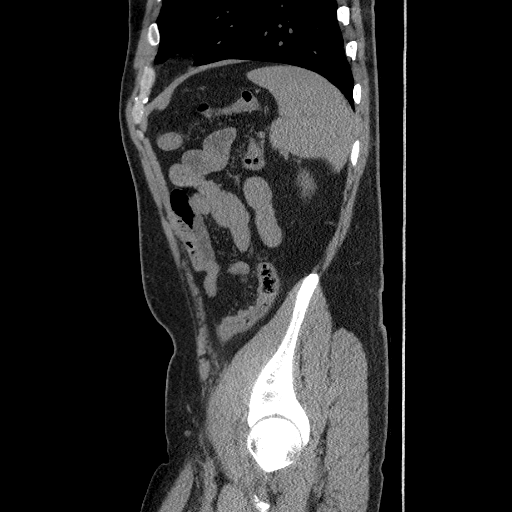
[im 123/161  bone]
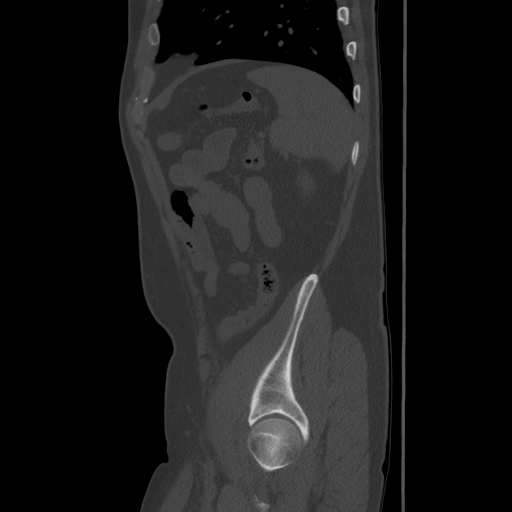
[im 132/161  soft-tissue]
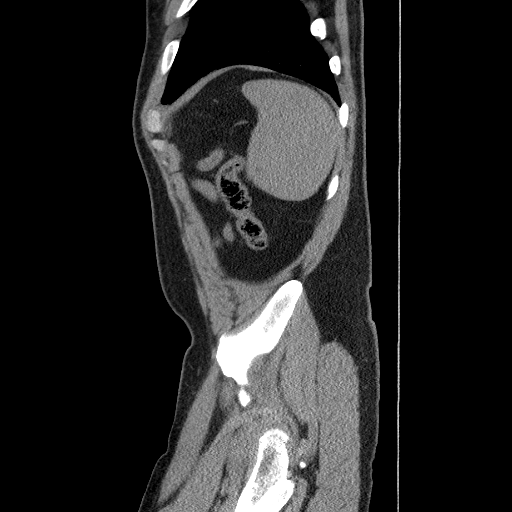
[im 151/161  soft-tissue]
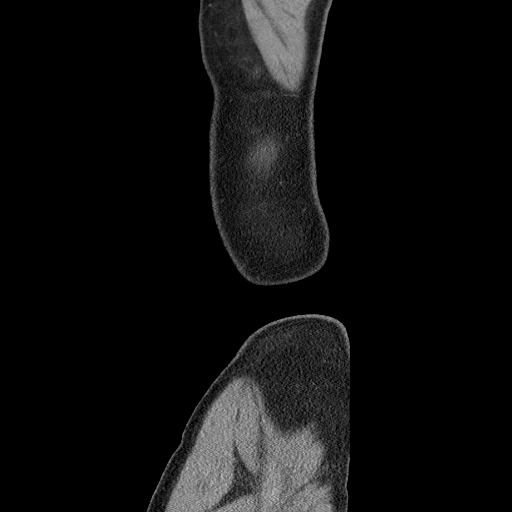

[13 of 36 positions shown; findings below may reference images not displayed]

FINDINGS: Sagittal images of the spine shows degenerative changes lumbar
spine. Lung bases shows stable pleural based nodule in right base
anteriorly measures 6 mm.

Unenhanced liver shows no biliary ductal dilatation. No calcified
gallstones are noted within gallbladder.

Unenhanced pancreas, spleen and adrenal glands are unremarkable.
Unenhanced kidneys are symmetrical in size. There is minimal left
hydronephrosis. No hydroureter bilaterally. No calcified ureteral
calculi are noted bilaterally. Nonobstructive calcified calculus in
midpole of the right kidney measures 2 mm.

Abdominal aorta is unremarkable.

No small bowel obstruction.  No ascites or free air.  No adenopathy.

No pericecal inflammation. The patient is status post appendectomy.
Bilateral distal ureter is unremarkable. No calcified calculi are
noted within bladder. No inguinal adenopathy. No destructive bony
lesions are noted within pelvis.
IMPRESSION: 1. There is minimal left hydronephrosis. No left hydroureter. No
calcified ureteral calculi are noted bilaterally. A recent passed
left ureteral calculus cannot be excluded. Clinical correlation is
necessary.
2. There is a 2 mm nonobstructive calculus in midpole of the right
kidney.
3. No calcified calculi are noted within urinary bladder.
4. No pericecal inflammation.  Status post appendectomy.
5. Degenerative changes lumbar spine.
6. No small bowel obstruction.

## 2016-10-21 DIAGNOSIS — I10 Essential (primary) hypertension: Secondary | ICD-10-CM | POA: Diagnosis not present

## 2016-10-21 DIAGNOSIS — L299 Pruritus, unspecified: Secondary | ICD-10-CM | POA: Diagnosis not present

## 2016-10-21 DIAGNOSIS — N529 Male erectile dysfunction, unspecified: Secondary | ICD-10-CM | POA: Diagnosis not present

## 2016-10-21 DIAGNOSIS — E291 Testicular hypofunction: Secondary | ICD-10-CM | POA: Diagnosis not present

## 2016-12-23 DIAGNOSIS — J019 Acute sinusitis, unspecified: Secondary | ICD-10-CM | POA: Diagnosis not present

## 2016-12-23 DIAGNOSIS — R05 Cough: Secondary | ICD-10-CM | POA: Diagnosis not present

## 2017-02-20 DIAGNOSIS — R0602 Shortness of breath: Secondary | ICD-10-CM | POA: Diagnosis not present

## 2017-02-20 DIAGNOSIS — R6889 Other general symptoms and signs: Secondary | ICD-10-CM | POA: Diagnosis not present

## 2017-02-20 DIAGNOSIS — E042 Nontoxic multinodular goiter: Secondary | ICD-10-CM | POA: Diagnosis not present

## 2017-02-20 DIAGNOSIS — I10 Essential (primary) hypertension: Secondary | ICD-10-CM | POA: Diagnosis not present

## 2017-02-24 ENCOUNTER — Other Ambulatory Visit: Payer: Self-pay | Admitting: Family Medicine

## 2017-02-24 ENCOUNTER — Ambulatory Visit
Admission: RE | Admit: 2017-02-24 | Discharge: 2017-02-24 | Disposition: A | Discharge status: Home / Self Care | Payer: BLUE CROSS/BLUE SHIELD | Source: Ambulatory Visit | Attending: Family Medicine | Admitting: Family Medicine

## 2017-02-24 DIAGNOSIS — R0602 Shortness of breath: Secondary | ICD-10-CM

## 2017-04-21 DIAGNOSIS — I10 Essential (primary) hypertension: Secondary | ICD-10-CM | POA: Diagnosis not present

## 2017-04-21 DIAGNOSIS — Z125 Encounter for screening for malignant neoplasm of prostate: Secondary | ICD-10-CM | POA: Diagnosis not present

## 2017-04-21 DIAGNOSIS — L299 Pruritus, unspecified: Secondary | ICD-10-CM | POA: Diagnosis not present

## 2017-04-21 DIAGNOSIS — N529 Male erectile dysfunction, unspecified: Secondary | ICD-10-CM | POA: Diagnosis not present

## 2017-04-21 DIAGNOSIS — E291 Testicular hypofunction: Secondary | ICD-10-CM | POA: Diagnosis not present

## 2017-09-28 ENCOUNTER — Ambulatory Visit
Admission: RE | Admit: 2017-09-28 | Discharge: 2017-09-28 | Disposition: A | Discharge status: Home / Self Care | Payer: BLUE CROSS/BLUE SHIELD | Source: Ambulatory Visit | Attending: Family Medicine | Admitting: Family Medicine

## 2017-09-28 ENCOUNTER — Other Ambulatory Visit: Payer: Self-pay | Admitting: Family Medicine

## 2017-09-28 DIAGNOSIS — R05 Cough: Secondary | ICD-10-CM | POA: Diagnosis not present

## 2017-09-28 DIAGNOSIS — J069 Acute upper respiratory infection, unspecified: Secondary | ICD-10-CM

## 2017-09-28 DIAGNOSIS — Z6829 Body mass index (BMI) 29.0-29.9, adult: Secondary | ICD-10-CM | POA: Diagnosis not present

## 2017-09-28 DIAGNOSIS — L749 Eccrine sweat disorder, unspecified: Secondary | ICD-10-CM | POA: Diagnosis not present

## 2017-10-26 DIAGNOSIS — Z125 Encounter for screening for malignant neoplasm of prostate: Secondary | ICD-10-CM | POA: Diagnosis not present

## 2017-10-26 DIAGNOSIS — N529 Male erectile dysfunction, unspecified: Secondary | ICD-10-CM | POA: Diagnosis not present

## 2017-10-26 DIAGNOSIS — L299 Pruritus, unspecified: Secondary | ICD-10-CM | POA: Diagnosis not present

## 2017-10-26 DIAGNOSIS — Z0000 Encounter for general adult medical examination without abnormal findings: Secondary | ICD-10-CM | POA: Diagnosis not present

## 2017-10-26 DIAGNOSIS — E291 Testicular hypofunction: Secondary | ICD-10-CM | POA: Diagnosis not present

## 2017-10-26 DIAGNOSIS — I10 Essential (primary) hypertension: Secondary | ICD-10-CM | POA: Diagnosis not present

## 2018-02-09 DIAGNOSIS — E291 Testicular hypofunction: Secondary | ICD-10-CM | POA: Diagnosis not present

## 2018-02-24 DIAGNOSIS — I10 Essential (primary) hypertension: Secondary | ICD-10-CM | POA: Diagnosis not present

## 2018-03-23 DIAGNOSIS — E291 Testicular hypofunction: Secondary | ICD-10-CM | POA: Diagnosis not present

## 2018-04-21 DIAGNOSIS — E291 Testicular hypofunction: Secondary | ICD-10-CM | POA: Diagnosis not present

## 2018-05-05 DIAGNOSIS — E291 Testicular hypofunction: Secondary | ICD-10-CM | POA: Diagnosis not present

## 2018-05-05 DIAGNOSIS — R7303 Prediabetes: Secondary | ICD-10-CM | POA: Diagnosis not present

## 2018-05-05 DIAGNOSIS — M79642 Pain in left hand: Secondary | ICD-10-CM | POA: Diagnosis not present

## 2018-05-05 DIAGNOSIS — I10 Essential (primary) hypertension: Secondary | ICD-10-CM | POA: Diagnosis not present

## 2018-05-05 DIAGNOSIS — N529 Male erectile dysfunction, unspecified: Secondary | ICD-10-CM | POA: Diagnosis not present

## 2018-10-29 DIAGNOSIS — R634 Abnormal weight loss: Secondary | ICD-10-CM | POA: Diagnosis not present

## 2018-11-01 DIAGNOSIS — Z0000 Encounter for general adult medical examination without abnormal findings: Secondary | ICD-10-CM | POA: Diagnosis not present

## 2018-11-01 DIAGNOSIS — I10 Essential (primary) hypertension: Secondary | ICD-10-CM | POA: Diagnosis not present

## 2018-11-01 DIAGNOSIS — N529 Male erectile dysfunction, unspecified: Secondary | ICD-10-CM | POA: Diagnosis not present

## 2018-11-01 DIAGNOSIS — E291 Testicular hypofunction: Secondary | ICD-10-CM | POA: Diagnosis not present

## 2018-11-01 DIAGNOSIS — R7303 Prediabetes: Secondary | ICD-10-CM | POA: Diagnosis not present

## 2019-08-05 ENCOUNTER — Other Ambulatory Visit: Payer: Self-pay

## 2019-08-05 DIAGNOSIS — Z20822 Contact with and (suspected) exposure to covid-19: Secondary | ICD-10-CM

## 2019-08-06 LAB — NOVEL CORONAVIRUS, NAA: SARS-CoV-2, NAA: NOT DETECTED

## 2019-08-24 DIAGNOSIS — R109 Unspecified abdominal pain: Secondary | ICD-10-CM | POA: Diagnosis not present

## 2019-08-24 DIAGNOSIS — R399 Unspecified symptoms and signs involving the genitourinary system: Secondary | ICD-10-CM | POA: Diagnosis not present

## 2019-08-24 DIAGNOSIS — Z87442 Personal history of urinary calculi: Secondary | ICD-10-CM | POA: Diagnosis not present

## 2019-08-24 DIAGNOSIS — M549 Dorsalgia, unspecified: Secondary | ICD-10-CM | POA: Diagnosis not present

## 2019-08-25 ENCOUNTER — Other Ambulatory Visit: Payer: Self-pay | Admitting: Family Medicine

## 2019-08-25 DIAGNOSIS — R109 Unspecified abdominal pain: Secondary | ICD-10-CM

## 2019-11-14 DIAGNOSIS — Z13220 Encounter for screening for lipoid disorders: Secondary | ICD-10-CM | POA: Diagnosis not present

## 2019-11-14 DIAGNOSIS — Z0000 Encounter for general adult medical examination without abnormal findings: Secondary | ICD-10-CM | POA: Diagnosis not present

## 2019-11-14 DIAGNOSIS — E291 Testicular hypofunction: Secondary | ICD-10-CM | POA: Diagnosis not present

## 2019-11-14 DIAGNOSIS — N529 Male erectile dysfunction, unspecified: Secondary | ICD-10-CM | POA: Diagnosis not present

## 2019-11-14 DIAGNOSIS — R7303 Prediabetes: Secondary | ICD-10-CM | POA: Diagnosis not present

## 2019-11-14 DIAGNOSIS — I10 Essential (primary) hypertension: Secondary | ICD-10-CM | POA: Diagnosis not present

## 2019-11-14 DIAGNOSIS — M79641 Pain in right hand: Secondary | ICD-10-CM | POA: Diagnosis not present

## 2019-11-21 DIAGNOSIS — E291 Testicular hypofunction: Secondary | ICD-10-CM | POA: Diagnosis not present

## 2019-11-21 DIAGNOSIS — Z8249 Family history of ischemic heart disease and other diseases of the circulatory system: Secondary | ICD-10-CM | POA: Diagnosis not present

## 2019-11-21 DIAGNOSIS — R7303 Prediabetes: Secondary | ICD-10-CM | POA: Diagnosis not present

## 2019-11-21 DIAGNOSIS — M79641 Pain in right hand: Secondary | ICD-10-CM | POA: Diagnosis not present

## 2019-11-21 DIAGNOSIS — Z8349 Family history of other endocrine, nutritional and metabolic diseases: Secondary | ICD-10-CM | POA: Diagnosis not present

## 2019-11-21 DIAGNOSIS — I10 Essential (primary) hypertension: Secondary | ICD-10-CM | POA: Diagnosis not present

## 2020-05-09 DIAGNOSIS — Z125 Encounter for screening for malignant neoplasm of prostate: Secondary | ICD-10-CM | POA: Diagnosis not present

## 2020-05-09 DIAGNOSIS — I10 Essential (primary) hypertension: Secondary | ICD-10-CM | POA: Diagnosis not present

## 2020-05-09 DIAGNOSIS — E291 Testicular hypofunction: Secondary | ICD-10-CM | POA: Diagnosis not present

## 2020-05-09 DIAGNOSIS — N529 Male erectile dysfunction, unspecified: Secondary | ICD-10-CM | POA: Diagnosis not present

## 2020-05-09 DIAGNOSIS — R7309 Other abnormal glucose: Secondary | ICD-10-CM | POA: Diagnosis not present

## 2020-06-20 DIAGNOSIS — E291 Testicular hypofunction: Secondary | ICD-10-CM | POA: Diagnosis not present

## 2020-10-31 DIAGNOSIS — E291 Testicular hypofunction: Secondary | ICD-10-CM | POA: Diagnosis not present

## 2020-10-31 DIAGNOSIS — Z125 Encounter for screening for malignant neoplasm of prostate: Secondary | ICD-10-CM | POA: Diagnosis not present

## 2020-10-31 DIAGNOSIS — N529 Male erectile dysfunction, unspecified: Secondary | ICD-10-CM | POA: Diagnosis not present

## 2020-10-31 DIAGNOSIS — R7303 Prediabetes: Secondary | ICD-10-CM | POA: Diagnosis not present

## 2020-10-31 DIAGNOSIS — I10 Essential (primary) hypertension: Secondary | ICD-10-CM | POA: Diagnosis not present

## 2020-10-31 DIAGNOSIS — Z13220 Encounter for screening for lipoid disorders: Secondary | ICD-10-CM | POA: Diagnosis not present

## 2020-10-31 DIAGNOSIS — Z0000 Encounter for general adult medical examination without abnormal findings: Secondary | ICD-10-CM | POA: Diagnosis not present

## 2020-12-05 DIAGNOSIS — M25571 Pain in right ankle and joints of right foot: Secondary | ICD-10-CM | POA: Diagnosis not present

## 2021-01-11 DIAGNOSIS — M791 Myalgia, unspecified site: Secondary | ICD-10-CM | POA: Diagnosis not present

## 2021-01-11 DIAGNOSIS — M25551 Pain in right hip: Secondary | ICD-10-CM | POA: Diagnosis not present

## 2021-01-11 DIAGNOSIS — M2550 Pain in unspecified joint: Secondary | ICD-10-CM | POA: Diagnosis not present

## 2021-01-11 DIAGNOSIS — M79672 Pain in left foot: Secondary | ICD-10-CM | POA: Diagnosis not present

## 2021-01-15 DIAGNOSIS — M25551 Pain in right hip: Secondary | ICD-10-CM | POA: Diagnosis not present

## 2021-02-12 DIAGNOSIS — M25551 Pain in right hip: Secondary | ICD-10-CM | POA: Diagnosis not present

## 2021-02-12 DIAGNOSIS — M25572 Pain in left ankle and joints of left foot: Secondary | ICD-10-CM | POA: Diagnosis not present

## 2021-03-15 DIAGNOSIS — M25572 Pain in left ankle and joints of left foot: Secondary | ICD-10-CM | POA: Diagnosis not present

## 2021-03-15 DIAGNOSIS — M545 Low back pain, unspecified: Secondary | ICD-10-CM | POA: Diagnosis not present

## 2021-03-15 DIAGNOSIS — M542 Cervicalgia: Secondary | ICD-10-CM | POA: Diagnosis not present

## 2021-04-08 ENCOUNTER — Emergency Department (HOSPITAL_COMMUNITY)
Admission: EM | Admit: 2021-04-08 | Discharge: 2021-04-08 | Disposition: A | Discharge status: Home / Self Care | Payer: BC Managed Care – PPO | Attending: Emergency Medicine | Admitting: Emergency Medicine

## 2021-04-08 ENCOUNTER — Emergency Department (HOSPITAL_COMMUNITY): Payer: BC Managed Care – PPO

## 2021-04-08 ENCOUNTER — Encounter (HOSPITAL_COMMUNITY): Payer: Self-pay | Admitting: *Deleted

## 2021-04-08 DIAGNOSIS — I491 Atrial premature depolarization: Secondary | ICD-10-CM | POA: Insufficient documentation

## 2021-04-08 DIAGNOSIS — Z79899 Other long term (current) drug therapy: Secondary | ICD-10-CM | POA: Diagnosis not present

## 2021-04-08 DIAGNOSIS — R110 Nausea: Secondary | ICD-10-CM | POA: Insufficient documentation

## 2021-04-08 DIAGNOSIS — R0789 Other chest pain: Secondary | ICD-10-CM | POA: Diagnosis not present

## 2021-04-08 DIAGNOSIS — Z7982 Long term (current) use of aspirin: Secondary | ICD-10-CM | POA: Diagnosis not present

## 2021-04-08 DIAGNOSIS — I10 Essential (primary) hypertension: Secondary | ICD-10-CM | POA: Insufficient documentation

## 2021-04-08 DIAGNOSIS — R079 Chest pain, unspecified: Secondary | ICD-10-CM | POA: Diagnosis not present

## 2021-04-08 DIAGNOSIS — I493 Ventricular premature depolarization: Secondary | ICD-10-CM | POA: Diagnosis not present

## 2021-04-08 DIAGNOSIS — R002 Palpitations: Secondary | ICD-10-CM

## 2021-04-08 DIAGNOSIS — Z87891 Personal history of nicotine dependence: Secondary | ICD-10-CM | POA: Diagnosis not present

## 2021-04-08 DIAGNOSIS — R0602 Shortness of breath: Secondary | ICD-10-CM | POA: Insufficient documentation

## 2021-04-08 LAB — CBC
HCT: 38.6 % — ABNORMAL LOW (ref 39.0–52.0)
Hemoglobin: 12.9 g/dL — ABNORMAL LOW (ref 13.0–17.0)
MCH: 30.3 pg (ref 26.0–34.0)
MCHC: 33.4 g/dL (ref 30.0–36.0)
MCV: 90.6 fL (ref 80.0–100.0)
Platelets: 268 10*3/uL (ref 150–400)
RBC: 4.26 MIL/uL (ref 4.22–5.81)
RDW: 13 % (ref 11.5–15.5)
WBC: 9.7 10*3/uL (ref 4.0–10.5)
nRBC: 0 % (ref 0.0–0.2)

## 2021-04-08 LAB — BASIC METABOLIC PANEL
Anion gap: 9 (ref 5–15)
BUN: 13 mg/dL (ref 8–23)
CO2: 26 mmol/L (ref 22–32)
Calcium: 9.2 mg/dL (ref 8.9–10.3)
Chloride: 106 mmol/L (ref 98–111)
Creatinine, Ser: 0.86 mg/dL (ref 0.61–1.24)
GFR, Estimated: >60 mL/min (ref >60)
Glucose, Bld: 113 mg/dL — ABNORMAL HIGH (ref 70–99)
Potassium: 3.9 mmol/L (ref 3.5–5.1)
Sodium: 141 mmol/L (ref 135–145)

## 2021-04-08 LAB — HEPATIC FUNCTION PANEL
ALT: 15 U/L (ref 0–44)
AST: 21 U/L (ref 15–41)
Albumin: 4.4 g/dL (ref 3.5–5.0)
Alkaline Phosphatase: 45 U/L (ref 38–126)
Bilirubin, Direct: 0.1 mg/dL (ref 0.0–0.2)
Indirect Bilirubin: 0.4 mg/dL (ref 0.3–0.9)
Total Bilirubin: 0.5 mg/dL (ref 0.3–1.2)
Total Protein: 7.4 g/dL (ref 6.5–8.1)

## 2021-04-08 LAB — TROPONIN I (HIGH SENSITIVITY)
Troponin I (High Sensitivity): 6 ng/L (ref <18)
Troponin I (High Sensitivity): 5 ng/L (ref <18)

## 2021-04-08 LAB — TSH: TSH: 0.728 u[IU]/mL (ref 0.350–4.500)

## 2021-04-08 LAB — MAGNESIUM: Magnesium: 2 mg/dL (ref 1.7–2.4)

## 2021-04-08 MED ORDER — METOPROLOL SUCCINATE ER 25 MG PO TB24: 25.0000 mg | ORAL_TABLET | Freq: Every day | ORAL | 0 refills | Status: DC

## 2021-04-08 NOTE — ED Provider Notes (Signed)
[facility] DEPT Provider Note   CSN: [id number] Arrival date & time: 04/08/21  1320     History Chief Complaint  Patient presents with  . Chest Pain  . Palpitations    Shawn [name] is a 60 y.o. male.  HPI Patient reports he has a history of irregular heartbeat that he believes is called ectopic atrial fibrillation.  He reports he had this years ago and was seen by Dr. Einar Gip.  At that time he had monitors done and stress test and nothing was abnormal.  He reports that he was put on metoprolol which she has been taking for years now.  He reports periodically he does get episodes of some palpitations but they are pretty short-lived.  Reports today he had a longer episode with more symptoms started about 1230 and he was feeling his heart flipping and beating irregularly.  He reports he was doing light work that was not exertional and he felt slight tightness in his chest and mildly short of breath.  Symptoms lasted about 10 minutes and then resolved.  He reports he was still slightly symptomatic on arrival to the emergency department but now symptoms are all gone.  He got briefly a little nauseated and that resolved as well.  He did not vomit.  No lower extremity swelling or calf pain.  Patient does not smoke.  No drug use.  Minimal alcohol use.    Past Medical History:  Diagnosis Date  . Blind right eye    accident age 84  . Chest pain   . Dyspnea on exertion   . History of anal fissures    Episodic resulting in hematochezia  . History of gastroesophageal reflux (GERD)   . Hypertension   . Kidney stone   . 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 Onychomycosis    treated with lamisil in the past  . Retinal detachment    right eye caused blindness    Patient Active Problem List   Diagnosis Date Noted  . Palpitations 02/11/2016  . Multinodular goiter 10/04/2015  . GERD (gastroesophageal  reflux disease) 05/22/2015  . Chest pain 12/19/2014  . Dyspnea on exertion 12/19/2014  . Sepsis(995.91) 08/27/2012  . UTI (lower urinary tract infection) 08/26/2012  . Hydronephrosis with obstructing calculus 08/25/2012  . Hyponatremia 08/24/2012  . Nephrolithiasis 08/24/2012  . Acute renal failure ([place]) 08/24/2012  . Leukocytosis 08/24/2012  . Hypertension   . Blind right eye     Past Surgical History:  Procedure Laterality Date  . APPENDECTOMY    . CYSTOSCOPY W/ URETERAL STENT PLACEMENT  08/24/2012   Procedure: CYSTOSCOPY WITH RETROGRADE PYELOGRAM/URETERAL STENT PLACEMENT;  Surgeon: Ailene Rud, MD;  Location: [facility];  Service: Urology;  Laterality: Right;  . CYSTOSCOPY/RETROGRADE/URETEROSCOPY  08/24/2012   Procedure: CYSTOSCOPY/RETROGRADE/URETEROSCOPY;  Surgeon: Ailene Rud, MD;  Location: [facility];  Service: Urology;  Laterality: Right;  Stone Basketing  . CYSTOSCOPY/RETROGRADE/URETEROSCOPY/STONE EXTRACTION WITH BASKET Left 04/11/2013   Procedure: CYSTOSCOPY/RETROGRADE/URETEROSCOPY/STONE EXTRACTION WITH BASKET;  Surgeon: Ailene Rud, MD;  Location[facility];  Service: Urology;  Laterality: Left;  BASKET STONE EXTRACTION WITH URETEROSCOPE    . Right retina detachment         Family History  Problem Relation Age of Onset  . Diabetes Mother   . Stroke Mother   . Aneurysm Mother   . Heart attack Father   . Hypertension Father   .  CAD Father   . Heart disease Father   . Aneurysm Paternal Grandmother   . Heart attack Paternal Grandfather   . Thyroid disease Neg Hx     Social History   Tobacco Use  . Smoking status: Former Smoker    Types: Pipe  . Smokeless tobacco: Never Used  Substance Use Topics  . Alcohol use: Yes    Comment: ocassionally beer or wine  . Drug use: No    Home Medications Prior to Admission medications   Medication Sig Start Date End Date Taking? Authorizing Provider  aspirin 81 MG tablet Take 81 mg by  mouth daily.    [provider]  metoprolol succinate (TOPROL-XL) 25 MG 24 hr tablet Take 25 mg by mouth daily.    [provider]  testosterone (ANDROGEL) 50 MG/5GM (1%) GEL Place 5 g onto the skin daily.    [provider]    Allergies    Patient has no known allergies.  Review of Systems   Review of Systems 10 systems reviewed and negative except as per HPI Physical Exam Updated Vital Signs BP 117/79 (BP Location: Left Arm)   Pulse 81   Temp 98.2 F (36.8 C) (Oral)   Resp 20   SpO2 99%   Physical Exam Constitutional:      Appearance: He is well-developed.  HENT:     Head: Normocephalic and atraumatic.     Mouth/Throat:     Pharynx: Oropharynx is clear.  Eyes:     Extraocular Movements: Extraocular movements intact.  Cardiovascular:     Rate and Rhythm: Normal rate and regular rhythm.     Heart sounds: Normal heart sounds.  Pulmonary:     Effort: Pulmonary effort is normal.     Breath sounds: Normal breath sounds.  Abdominal:     General: Bowel sounds are normal. There is no distension.     Palpations: Abdomen is soft.     Tenderness: There is no abdominal tenderness.  Musculoskeletal:        General: No swelling or tenderness. Normal range of motion.     Cervical back: Neck supple.     Right lower leg: No edema.     Left lower leg: No edema.  Skin:    General: Skin is warm and dry.  Neurological:     General: No focal deficit present.     Mental Status: He is alert and oriented to person, place, and time.     GCS: GCS eye subscore is 4. GCS verbal subscore is 5. GCS motor subscore is 6.     Coordination: Coordination normal.  Psychiatric:        Mood and Affect: Mood normal.     ED Results / Procedures / Treatments   Labs (all labs ordered are listed, but only abnormal results are displayed) Labs Reviewed  BASIC METABOLIC PANEL - Abnormal; Notable for the following components:      Result Value   Glucose, Bld 113 (*)    All  other components within normal limits  CBC - Abnormal; Notable for the following components:   Hemoglobin 12.9 (*)    HCT 38.6 (*)    All other components within normal limits  HEPATIC FUNCTION PANEL  MAGNESIUM  TSH  TROPONIN I (HIGH SENSITIVITY)  TROPONIN I (HIGH SENSITIVITY)    EKG EKG Interpretation  Date/Time:  Monday April 08 2021 13:35:02 EDT Ventricular Rate:  93 PR Interval:  139 QRS Duration: 84 QT Interval:  343 QTC Calculation: 427 R Axis:   39 Text Interpretation: Sinus rhythm Atrial premature complex RSR' in V1 or V2, right VCD or RVH agree, PACs no other sig change from old Confirmed by Charlesetta Shanks [phone]) on 04/08/2021 3:56:11 PM   Radiology DG Chest 2 View  Result Date: 04/08/2021 CLINICAL DATA:  Chest pain short of breath EXAM: CHEST - 2 VIEW COMPARISON:  09/28/2017 FINDINGS: The heart size and mediastinal contours are within normal limits. Both lungs are clear. The visualized skeletal structures are unremarkable. IMPRESSION: No active cardiopulmonary disease. Electronically Signed   By: Franchot Gallo M.D.   On: 04/08/2021 14:37    Procedures Procedures   Medications Ordered in ED Medications - No data to display  ED Course  I have reviewed the triage vital signs and the nursing notes.  Pertinent labs & imaging results that were available during my care of the patient were reviewed by me and considered in my medical decision making (see chart for details).    MDM Rules/Calculators/A&P                          Patient presents with a self-limited episode of palpitations with mild chest tightness.  This time patient is asymptomatic.  EKG is unchanged from previous.  Occasional PACs are present.  Patient takes metoprolol.  Vital signs are normal.  We will proceed with cardiac work-up.  Patient does not have history of exertional chest pain.  Pending 2 sets of troponins if patient remains asymptomatic anticipate plan for discharge with close follow-up  with Dr. Einar Gip for recheck. Final Clinical Impression(s) / ED Diagnoses Final diagnoses:  Palpitations  PAC (premature atrial contraction)    Rx / DC Orders ED Discharge Orders    None       Charlesetta Shanks, MD 04/08/21 1557

## 2021-04-08 NOTE — ED Triage Notes (Signed)
Pt complains of chest tightness, shortness of breath, palpitations that occurred while at work today. Reports history of ectopic atrial fibrillation, for which he takes metoprolol.

## 2021-04-08 NOTE — ED Triage Notes (Signed)
Emergency Medicine Provider Triage Evaluation Note  Shawn [name] , a 60 y.o. male  was evaluated in triage.  Pt complains of palpitations.  Patient states about 1 hour ago he was at work doing his normal activities and began having chest tightness, shortness of breath, as well as palpitations.  Patient reports a history of palpitations for which she has been evaluated for in the past.  He states that he takes lisinopril in the a.m. as well as 50 mg of metoprolol mid afternoon.  He states that his symptoms lasted about 5 to 10 minutes and spontaneously resolved.  No diaphoresis or leg swelling.  Reports some mild nausea.  No vomiting.  States he is currently asymptomatic.  He is not anticoagulated.  Reports a history of MI in his father.  He is not a smoker.  States that he [place] about 3 hours from the beach [place] but otherwise denies any recent travel or immobilization, cancer history, hemoptysis, leg swelling, history of blood clots, testosterone use.3  Physical Exam  BP 120/79 (BP Location: Left Arm)   Pulse 92   Temp 98 F (36.7 C) (Oral)   Resp 16   SpO2 99%  Gen:   Awake, no distress   HEENT:  Atraumatic  Resp:  Normal effort  Cardiac:  Normal rate  Abd:   Nondistended, nontender  MSK:   Moves extremities without difficulty  Neuro:  Speech clear   Medical Decision Making  Medically screening exam initiated at 1:54 PM.  Appropriate orders placed.  Ahmadou Bolz was informed that the remainder of the evaluation will be completed by another provider, this initial triage assessment does not replace that evaluation, and the importance of remaining in the ED until their evaluation is complete.   Rayna Sexton, PA-C 04/08/21 1356

## 2021-04-08 NOTE — Discharge Instructions (Addendum)
1.  Take an additional 25 mg of metoprolol with your 50 mg dose.  Monitor your heart rate and your blood pressure.  If you feel lightheaded and dizzy discontinue the additional metoprolol.  Do not take additional dose if your heart rate is 60 or less or blood pressure is less than 937 systolic. 2.  Follow-up with Dr. Einar Gip as soon as possible for recheck. 3.  Return to the emergency department if you have recurrence of symptoms, chest pain or other concerning symptoms.

## 2021-04-12 DIAGNOSIS — M545 Low back pain, unspecified: Secondary | ICD-10-CM | POA: Diagnosis not present

## 2021-04-22 DIAGNOSIS — M545 Low back pain, unspecified: Secondary | ICD-10-CM | POA: Diagnosis not present

## 2021-04-25 DIAGNOSIS — M545 Low back pain, unspecified: Secondary | ICD-10-CM | POA: Diagnosis not present

## 2021-04-29 DIAGNOSIS — M545 Low back pain, unspecified: Secondary | ICD-10-CM | POA: Diagnosis not present

## 2021-05-02 DIAGNOSIS — M545 Low back pain, unspecified: Secondary | ICD-10-CM | POA: Diagnosis not present

## 2021-05-07 DIAGNOSIS — R7303 Prediabetes: Secondary | ICD-10-CM | POA: Diagnosis not present

## 2021-05-07 DIAGNOSIS — I10 Essential (primary) hypertension: Secondary | ICD-10-CM | POA: Diagnosis not present

## 2021-05-07 DIAGNOSIS — E291 Testicular hypofunction: Secondary | ICD-10-CM | POA: Diagnosis not present

## 2021-05-07 DIAGNOSIS — K219 Gastro-esophageal reflux disease without esophagitis: Secondary | ICD-10-CM | POA: Diagnosis not present

## 2021-05-07 DIAGNOSIS — N529 Male erectile dysfunction, unspecified: Secondary | ICD-10-CM | POA: Diagnosis not present

## 2021-05-09 DIAGNOSIS — M545 Low back pain, unspecified: Secondary | ICD-10-CM | POA: Diagnosis not present

## 2021-07-02 DIAGNOSIS — R609 Edema, unspecified: Secondary | ICD-10-CM | POA: Diagnosis not present

## 2021-07-02 DIAGNOSIS — M545 Low back pain, unspecified: Secondary | ICD-10-CM | POA: Diagnosis not present

## 2021-07-03 ENCOUNTER — Other Ambulatory Visit: Payer: Self-pay | Admitting: Family Medicine

## 2021-07-03 DIAGNOSIS — M545 Low back pain, unspecified: Secondary | ICD-10-CM

## 2021-07-09 ENCOUNTER — Ambulatory Visit
Admission: RE | Admit: 2021-07-09 | Discharge: 2021-07-09 | Disposition: A | Discharge status: Home / Self Care | Payer: BC Managed Care – PPO | Source: Ambulatory Visit | Attending: Family Medicine | Admitting: Family Medicine

## 2021-07-09 DIAGNOSIS — M48061 Spinal stenosis, lumbar region without neurogenic claudication: Secondary | ICD-10-CM | POA: Diagnosis not present

## 2021-07-09 DIAGNOSIS — M545 Low back pain, unspecified: Secondary | ICD-10-CM | POA: Diagnosis not present

## 2021-09-27 DIAGNOSIS — Z6827 Body mass index (BMI) 27.0-27.9, adult: Secondary | ICD-10-CM | POA: Diagnosis not present

## 2021-09-27 DIAGNOSIS — M48062 Spinal stenosis, lumbar region with neurogenic claudication: Secondary | ICD-10-CM | POA: Diagnosis not present

## 2021-11-06 DIAGNOSIS — Z125 Encounter for screening for malignant neoplasm of prostate: Secondary | ICD-10-CM | POA: Diagnosis not present

## 2021-11-06 DIAGNOSIS — K219 Gastro-esophageal reflux disease without esophagitis: Secondary | ICD-10-CM | POA: Diagnosis not present

## 2021-11-06 DIAGNOSIS — Z0000 Encounter for general adult medical examination without abnormal findings: Secondary | ICD-10-CM | POA: Diagnosis not present

## 2021-11-06 DIAGNOSIS — I10 Essential (primary) hypertension: Secondary | ICD-10-CM | POA: Diagnosis not present

## 2021-11-06 DIAGNOSIS — R634 Abnormal weight loss: Secondary | ICD-10-CM | POA: Diagnosis not present

## 2021-11-06 DIAGNOSIS — R7303 Prediabetes: Secondary | ICD-10-CM | POA: Diagnosis not present

## 2021-11-06 DIAGNOSIS — N529 Male erectile dysfunction, unspecified: Secondary | ICD-10-CM | POA: Diagnosis not present

## 2021-11-06 DIAGNOSIS — Z13220 Encounter for screening for lipoid disorders: Secondary | ICD-10-CM | POA: Diagnosis not present

## 2021-11-06 DIAGNOSIS — E291 Testicular hypofunction: Secondary | ICD-10-CM | POA: Diagnosis not present

## 2021-11-29 DIAGNOSIS — U071 COVID-19: Secondary | ICD-10-CM | POA: Diagnosis not present

## 2021-12-11 DIAGNOSIS — E291 Testicular hypofunction: Secondary | ICD-10-CM | POA: Diagnosis not present

## 2022-01-22 DIAGNOSIS — M792 Neuralgia and neuritis, unspecified: Secondary | ICD-10-CM | POA: Diagnosis not present

## 2022-01-22 DIAGNOSIS — Z6825 Body mass index (BMI) 25.0-25.9, adult: Secondary | ICD-10-CM | POA: Diagnosis not present

## 2022-01-23 ENCOUNTER — Encounter: Payer: Self-pay | Admitting: Urology

## 2022-01-23 ENCOUNTER — Ambulatory Visit (INDEPENDENT_AMBULATORY_CARE_PROVIDER_SITE_OTHER): Payer: BLUE CROSS/BLUE SHIELD | Admitting: Urology

## 2022-01-23 ENCOUNTER — Other Ambulatory Visit: Payer: Self-pay

## 2022-01-23 VITALS — BP 108/70 | HR 80 | Ht 70.5 in | Wt 181.0 lb

## 2022-01-23 DIAGNOSIS — E349 Endocrine disorder, unspecified: Secondary | ICD-10-CM

## 2022-01-23 DIAGNOSIS — E291 Testicular hypofunction: Secondary | ICD-10-CM

## 2022-01-23 NOTE — Progress Notes (Signed)
01/23/2022 10:55 AM   Shawn [name] 03-Mar-1961 [id number]  Referring provider: Lois Huxley, [place],  [place] [address]  Chief Complaint  Patient presents with   Hypogonadism    HPI: Shawn [name] is a 61 y.o. male referred for evaluation of hypogonadism.  On TRT ~ 3 years with a compounded testosterone cream Recently testosterone levels <200 while on replacement and he discontinued TRT approximately 3 weeks ago.  Presently taking an over-the-counter testosterone booster Testosterone level 12/11/2021 was 182.4; PSA 0.53 Has profound tiredness, fatigue.  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-followed by Dr Arnoldo Lenis   Onychomycosis    treated with lamisil in the past   Retinal detachment    right eye caused blindness    Surgical History: Past Surgical History:  Procedure Laterality Date   APPENDECTOMY     CYSTOSCOPY W/ URETERAL STENT PLACEMENT  08/24/2012   Procedure: CYSTOSCOPY WITH RETROGRADE PYELOGRAM/URETERAL STENT PLACEMENT;  Surgeon: Ailene Rud, MD;  Location: [facility];  Service: Urology;  Laterality: Right;   CYSTOSCOPY/RETROGRADE/URETEROSCOPY  08/24/2012   Procedure: CYSTOSCOPY/RETROGRADE/URETEROSCOPY;  Surgeon: Ailene Rud, MD;  Location: [facility];  Service: Urology;  Laterality: Right;  [place]   CYSTOSCOPY/RETROGRADE/URETEROSCOPY/STONE EXTRACTION WITH BASKET Left 04/11/2013   Procedure: CYSTOSCOPY/RETROGRADE/URETEROSCOPY/STONE EXTRACTION WITH BASKET;   Surgeon: Ailene Rud, MD;  Location: [facility];  Service: Urology;  Laterality: Left;  BASKET STONE EXTRACTION WITH URETEROSCOPE     Right retina detachment      Home Medications:  Allergies as of 01/23/2022   No Known Allergies      Medication List        Accurate as of January 23, 2022 10:55 AM. If you have any questions, ask your nurse or doctor.          STOP taking these medications    testosterone 50 MG/5GM (1%) Gel Commonly known as: ANDROGEL Stopped by: Abbie Sons, MD       TAKE these medications    aspirin 81 MG tablet Take 81 mg by mouth daily as needed (blood pressure).   celecoxib 200 MG capsule Commonly known as: CELEBREX Take 200 mg by mouth daily as needed.   gabapentin 300 MG capsule Commonly known as: NEURONTIN Take by mouth.   lisinopril 20 MG tablet Commonly known as: ZESTRIL Take 1 tablet by mouth every morning.   metoprolol succinate 50 MG 24 hr tablet Commonly known as: TOPROL-XL Take 50 mg by mouth daily. What changed: Another medication with the same name was removed. Continue taking this medication, and follow the directions you see here. Changed by: Abbie Sons, MD   tadalafil 20 MG tablet Commonly known as: CIALIS SMARTSIG:0.5-1 Tablet(s) By Mouth        Allergies: No Known Allergies  Family History: Family History  Problem Relation Age of Onset   Diabetes Mother    Stroke Mother  Aneurysm Mother    Heart attack Father    Hypertension Father    CAD Father    Heart disease Father    Aneurysm Paternal Grandmother    Heart attack Paternal Grandfather    Thyroid disease Neg Hx     Social History:  reports that he has quit smoking. His smoking use included pipe. He has never used smokeless tobacco. He reports current alcohol use. He reports that he does not use drugs.   Physical Exam: BP 108/70    Pulse 80    Ht 5' 10.5" (1.791 m)    Wt 181 lb (82.1 kg)    BMI 25.60 kg/m    Constitutional:  Alert and oriented, No acute distress. HEENT: [place] AT, moist mucus membranes.  Trachea midline, no masses. Cardiovascular: No clubbing, cyanosis, or edema. Respiratory: Normal respiratory effort, no increased work of breathing. GU: Phallus without lesions, testes descended bilaterally with estimated testicular volume ~ 15 cc bilaterally Skin: No rashes, bruises or suspicious lesions. Neurologic: Grossly intact, no focal deficits, moving all 4 extremities. Psychiatric: Normal mood and affect.   Assessment & Plan:    1.  Hypogonadism Symptomatic with profound tiredness, fatigue Positive ED and low libido Recent testosterone levels low on compounded testosterone cream Check testosterone level and LH today If consistently low he is interested in starting testosterone injections He has a friend who is a home health nurse who is willing to give injections We discussed the diagnosis of testosterone is based on 2 abnormal total testosterone levels drawn in the a.m. admitted with signs and symptoms of low testosterone. We discussed various forms of testosterone placement including topical preparations, intramuscular injections, subcutaneous injections, subcutaneous pellet implantation and oral testosterone.  Pros and cons of each form were discussed.  The risk of transference of topical testosterone. I had an extensive discussion regarding testosterone replacement therapy including the following: Treatment may result in improvements in erectile function, low sex drive, anemia, bone mineral density, lean body mass, and depressive symptoms; evidence is inconclusive whether testosterone therapy improves cognitive function, measures of diabetes, energy, fatigue, lipid profiles, and quality of life measures; there is no conclusive evidence linking testosterone therapy to the development of prostate cancer; there is no definitive evidence linking testosterone therapy to a higher incidence of  venothrombolic events; at the present time it cannot be stated definitively whether testosterone therapy increases or decreases the risk of cardiovascular events including myocardial infarction and stroke. Potential side effects were discussed including erythrocytosis, gynecomastia.  The need for regular monitoring of testosterone levels and hematocrit was discussed. He will be contacted with lab work that was drawn today   Abbie Sons, [place] [address], [place], [place] [address] [phone]

## 2022-01-24 ENCOUNTER — Other Ambulatory Visit: Payer: Self-pay | Admitting: Urology

## 2022-01-24 ENCOUNTER — Telehealth: Payer: Self-pay | Admitting: *Deleted

## 2022-01-24 LAB — LUTEINIZING HORMONE: LH: 9.1 m[IU]/mL — ABNORMAL HIGH (ref 1.7–8.6)

## 2022-01-24 LAB — TESTOSTERONE: Testosterone: 71 ng/dL — ABNORMAL LOW (ref 264–916)

## 2022-01-24 MED ORDER — "LUER LOCK SAFETY SYRINGES 21G X 1-1/2"" 3 ML MISC": 0 refills | Status: DC

## 2022-01-24 MED ORDER — "NEEDLE (DISP) 18G X 1"" MISC": 0 refills | Status: DC

## 2022-01-24 MED ORDER — TESTOSTERONE CYPIONATE 200 MG/ML IM SOLN: 200.0000 mg | INTRAMUSCULAR | 0 refills | Status: DC

## 2022-01-24 NOTE — Telephone Encounter (Signed)
Notified patient as instructed, patient pleased. Discussed follow-up appointments, patient agrees  

## 2022-01-24 NOTE — Telephone Encounter (Signed)
-----   Message from Abbie Sons, MD sent at 01/24/2022  2:55 PM EST ----- Testosterone level was low at 71.  LH was slightly elevated which indicates no evidence of pituitary abnormalities.  He was interested in starting injections and Rx testosterone was sent to pharmacy.  He has a friend who is a Emergency planning/management officer and has agreed to give him the injections.  Once he starts injections needs a follow-up office visit with midcycle testosterone level in 4-6 weeks.

## 2022-01-26 ENCOUNTER — Encounter: Payer: Self-pay | Admitting: Urology

## 2022-01-27 ENCOUNTER — Encounter: Payer: Self-pay | Admitting: Diagnostic Neuroimaging

## 2022-01-27 ENCOUNTER — Ambulatory Visit (INDEPENDENT_AMBULATORY_CARE_PROVIDER_SITE_OTHER): Payer: BLUE CROSS/BLUE SHIELD | Admitting: Diagnostic Neuroimaging

## 2022-01-27 VITALS — BP 105/72 | HR 104 | Ht 70.5 in | Wt 189.0 lb

## 2022-01-27 DIAGNOSIS — D472 Monoclonal gammopathy: Secondary | ICD-10-CM | POA: Diagnosis not present

## 2022-01-27 DIAGNOSIS — R200 Anesthesia of skin: Secondary | ICD-10-CM

## 2022-01-27 NOTE — Progress Notes (Signed)
[facility]  PATIENT: Shawn [name] DOB: 09-Feb-1961  REFERRING CLINICIAN: Kristeen Miss, MD HISTORY FROM: patient  REASON FOR VISIT: new consult    HISTORICAL  CHIEF COMPLAINT:  Chief Complaint  Patient presents with   New Patient (Initial Visit)    Rm 6 alone here for consult worsening neuropathy. Pt reports sx have been present for while;     HISTORY OF PRESENT ILLNESS:   61 year old male here for evaluation of numbness and tingling in the toes.  Patient has had numbness and right-sided toes for 10 years.  In summer 2022 he noticed numbness in the left toes.  The last few months numbness now affecting bilateral feet up to the ankles.  Patient has been to neurosurgery for MRI lumbar spine findings showing some foraminal stenosis.  Patient referred here to evaluate for neuropathy or other neurologic etiologies.   REVIEW OF SYSTEMS: Full 14 system review of systems performed and negative with exception of: as per HPI.  ALLERGIES: No Known Allergies  HOME MEDICATIONS: Outpatient Medications Prior to Visit  Medication Sig Dispense Refill   aspirin 81 MG tablet Take 81 mg by mouth daily as needed (blood pressure).     celecoxib (CELEBREX) 200 MG capsule Take 200 mg by mouth daily as needed.     gabapentin (NEURONTIN) 300 MG capsule Take by mouth.     lisinopril (ZESTRIL) 20 MG tablet Take 1 tablet by mouth every morning.     metoprolol succinate (TOPROL-XL) 50 MG 24 hr tablet Take 50 mg by mouth daily.     NEEDLE, DISP, 18 G 18G X 1" MISC Use as directed 50 each 0   SYRINGE-NEEDLE, DISP, 3 ML (LUER LOCK SAFETY SYRINGES) 21G X 1-1/2" 3 ML MISC Use as directed for testosterone administration 50 each 0   testosterone cypionate (DEPOTESTOSTERONE CYPIONATE) 200 MG/ML injection Inject 1 mL (200 mg total) into the muscle every 14 (fourteen) days. 4 mL 0   tadalafil (CIALIS) 20 MG tablet SMARTSIG:0.5-1 Tablet(s) By Mouth (Patient not taking: Reported on 01/27/2022)      No facility-administered medications prior to visit.    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-followed by Dr Arnoldo Lenis   Onychomycosis    treated with lamisil in the past   Retinal detachment    right eye caused blindness    PAST SURGICAL HISTORY: Past Surgical History:  Procedure Laterality Date   APPENDECTOMY     CYSTOSCOPY W/ URETERAL STENT PLACEMENT  08/24/2012   Procedure: CYSTOSCOPY WITH RETROGRADE PYELOGRAM/URETERAL STENT PLACEMENT;  Surgeon: Ailene Rud, MD;  Location: [facility];  Service: Urology;  Laterality: Right;   CYSTOSCOPY/RETROGRADE/URETEROSCOPY  08/24/2012   Procedure: CYSTOSCOPY/RETROGRADE/URETEROSCOPY;  Surgeon: Ailene Rud, MD;  Location: [facility];  Service: Urology;  Laterality: Right;  [place]   CYSTOSCOPY/RETROGRADE/URETEROSCOPY/STONE EXTRACTION WITH BASKET Left 04/11/2013   Procedure: CYSTOSCOPY/RETROGRADE/URETEROSCOPY/STONE EXTRACTION WITH BASKET;  Surgeon: Ailene Rud, MD;  Location: [facility];  Service: Urology;  Laterality: Left;  BASKET STONE EXTRACTION WITH URETEROSCOPE     Right retina detachment      FAMILY HISTORY:  Family History  Problem Relation Age of Onset   Diabetes Mother    Stroke Mother    Aneurysm Mother    Heart attack Father    Hypertension Father    CAD Father    Heart disease Father    Aneurysm Paternal Grandmother    Heart attack Paternal Grandfather    Thyroid disease Neg Hx     SOCIAL HISTORY: Social History   Socioeconomic History   Marital status: Married    Spouse name: Not on file   Number of children: Not on file   Years of education: Not on file    Highest education level: Not on file  Occupational History   Not on file  Tobacco Use   Smoking status: Former    Types: Pipe   Smokeless tobacco: Never  Substance and Sexual Activity   Alcohol use: Yes    Comment: ocassionally beer or wine   Drug use: No   Sexual activity: Never  Other Topics Concern   Not on file  Social History Narrative   Not on file   Social Determinants of Health   Financial Resource Strain: Not on file  Food Insecurity: Not on file  Transportation Needs: Not on file  Physical Activity: Not on file  Stress: Not on file  Social Connections: Not on file  Intimate Partner Violence: Not on file     PHYSICAL EXAM  GENERAL EXAM/CONSTITUTIONAL: Vitals:  Vitals:   01/27/22 1616  BP: 105/72  Pulse: (!) 104  Weight: 189 lb (85.7 kg)  Height: 5' 10.5" (1.791 m)   Body mass index is 26.74 kg/m. Wt Readings from Last 3 Encounters:  01/27/22 189 lb (85.7 kg)  01/23/22 181 lb (82.1 kg)  02/11/16 222 lb (100.7 kg)   Patient is in no distress; well developed, nourished and groomed; neck is supple  CARDIOVASCULAR: Examination of carotid arteries is normal; no carotid bruits Regular rate and rhythm, no murmurs Examination of peripheral vascular system by observation and palpation is normal  EYES: Ophthalmoscopic exam of optic discs and posterior segments is normal; no papilledema or hemorrhages No results found.  MUSCULOSKELETAL: Gait, strength, tone, movements noted in Neurologic exam below  NEUROLOGIC: MENTAL STATUS:  No flowsheet data found. awake, alert, oriented to person, place and time recent and remote memory intact normal attention and concentration language fluent, comprehension intact, naming intact fund of knowledge appropriate  CRANIAL NERVE:  2nd - no papilledema on fundoscopic exam 2nd, 3rd, 4th, 6th - pupils equal and reactive to light, visual fields full to confrontation, extraocular muscles intact, no nystagmus 5th -  facial sensation symmetric 7th - facial strength symmetric 8th - hearing intact 9th - palate elevates symmetrically, uvula midline 11th - shoulder shrug symmetric 12th - tongue protrusion midline  MOTOR:  normal bulk and tone, full strength in the BUE, BLE  SENSORY:  normal and symmetric to light touch, pinprick, temperature, vibration; DECR VIB, TEMP PP IN FEET  COORDINATION:  finger-nose-finger, fine finger movements normal  REFLEXES:  deep tendon reflexes trace and symmetric; ABSENT AT ANKLES  GAIT/STATION:  narrow based gait   DIAGNOSTIC DATA (LABS, IMAGING, TESTING) - I reviewed patient records, labs, notes, testing and imaging myself where available.  Lab Results  Component Value Date   WBC 9.7 04/08/2021   HGB 12.9 (L) 04/08/2021   HCT 38.6 (L) 04/08/2021   MCV 90.6 04/08/2021   PLT 268 04/08/2021      Component Value Date/Time   NA 141 04/08/2021  1346   K 3.9 04/08/2021 1346   CL 106 04/08/2021 1346   CO2 26 04/08/2021 1346   GLUCOSE 113 (H) 04/08/2021 1346   BUN 13 04/08/2021 1346   CREATININE 0.86 04/08/2021 1346   CREATININE 0.98 02/11/2016 0901   CALCIUM 9.2 04/08/2021 1346   PROT 7.4 04/08/2021 1346   ALBUMIN 4.4 04/08/2021 1346   AST 21 04/08/2021 1346   ALT 15 04/08/2021 1346   ALKPHOS 45 04/08/2021 1346   BILITOT 0.5 04/08/2021 1346   GFRNONAA >60 04/08/2021 1346   GFRAA >60 05/29/2015 0358   No results found for: CHOL, HDL, LDLCALC, LDLDIRECT, TRIG, CHOLHDL Lab Results  Component Value Date   HGBA1C 5.8 (H) 02/11/2016   No results found for: VITAMINB12 Lab Results  Component Value Date   TSH 0.728 04/08/2021    07/09/21 MRI lumbar spine 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 No apparent neural compressive stenosis at L2-3 or above.   At L3-4, there is 2 mm retrolisthesis, endplate osteophytes and bulging of the disc more towards the left. Facets are mildly hypertrophic. There is stenosis  of both lateral recesses, left worse than right. Neural compression could occur at this level, particularly in the left lateral recess.   L4-5: Endplate osteophytes, bulging of the disc and facet hypertrophy. Stenosis of both lateral recesses that could cause neural compression. Left foraminal stenosis that could additionally compress the left L4 nerve.   L5-S1: Disc bulge and facet degeneration. Bilateral foraminal narrowing that could possibly affect either exiting L5 nerve. Facet arthropathy on the right shows edematous change in could be painful.   Discogenic endplate edema at K27-06, L3-4 and L4-5, which could contribute to regional back pain.    ASSESSMENT AND PLAN  61 y.o. year old male here with:   Dx:  1. Numbness in feet       PLAN:  NUMBNESS / TINGLING / BURNING FEET (could be related to multi-level lumbar foraminal stenosis; however will proceed with neuropathy workup) - check EMG/NCS - check neuropathy labs  Orders Placed This Encounter  Procedures   CBC with diff   CMP   Vitamin B12   MMA   Homocysteine   A1c   TSH   SPEP with IFE   ANA w/Reflex   SSA, SSB   T4, Free   Vitamin B6   Vitamin B1   ANCA Profile   RPR   HIV   Hepatitis C antibody   Hepatitis B core antibody, total   Hepatitis B surface antigen   Hepatitis B surface antibody, qualitative   NCV with EMG(electromyography)   Return for for NCV/EMG.    Penni Bombard, MD 2/37/6283, 1:51 PM Certified in Neurology, Neurophysiology and Neuroimaging  [facility] [address], [place], [place] [address] [phone]

## 2022-01-31 LAB — VITAMIN B1: Thiamine: 127.3 nmol/L (ref 66.5–200.0)

## 2022-01-31 LAB — CBC WITH DIFFERENTIAL/PLATELET
Basophils Absolute: 0.1 10*3/uL (ref 0.0–0.2)
Basos: 1 %
EOS (ABSOLUTE): 0.2 10*3/uL (ref 0.0–0.4)
Eos: 2 %
Hematocrit: 33.9 % — ABNORMAL LOW (ref 37.5–51.0)
Hemoglobin: 11.6 g/dL — ABNORMAL LOW (ref 13.0–17.7)
Immature Grans (Abs): 0.1 10*3/uL (ref 0.0–0.1)
Immature Granulocytes: 1 %
Lymphocytes Absolute: 1.7 10*3/uL (ref 0.7–3.1)
Lymphs: 14 %
MCH: 29.2 pg (ref 26.6–33.0)
MCHC: 34.2 g/dL (ref 31.5–35.7)
MCV: 85 fL (ref 79–97)
Monocytes Absolute: 0.9 10*3/uL (ref 0.1–0.9)
Monocytes: 7 %
Neutrophils Absolute: 9.5 10*3/uL — ABNORMAL HIGH (ref 1.4–7.0)
Neutrophils: 75 %
Platelets: 389 10*3/uL (ref 150–450)
RBC: 3.97 x10E6/uL — ABNORMAL LOW (ref 4.14–5.80)
RDW: 12.3 % (ref 11.6–15.4)
WBC: 12.4 10*3/uL — ABNORMAL HIGH (ref 3.4–10.8)

## 2022-01-31 LAB — MULTIPLE MYELOMA PANEL, SERUM
Albumin SerPl Elph-Mcnc: 3.9 g/dL (ref 2.9–4.4)
Albumin/Glob SerPl: 1.1 (ref 0.7–1.7)
Alpha 1: 0.3 g/dL (ref 0.0–0.4)
Alpha2 Glob SerPl Elph-Mcnc: 0.8 g/dL (ref 0.4–1.0)
B-Globulin SerPl Elph-Mcnc: 1 g/dL (ref 0.7–1.3)
Gamma Glob SerPl Elph-Mcnc: 1.8 g/dL (ref 0.4–1.8)
Globulin, Total: 3.9 g/dL (ref 2.2–3.9)
IgA/Immunoglobulin A, Serum: 173 mg/dL (ref 61–437)
IgG (Immunoglobin G), Serum: 1800 mg/dL — ABNORMAL HIGH (ref 603–1613)
IgM (Immunoglobulin M), Srm: 37 mg/dL (ref 20–172)
M Protein SerPl Elph-Mcnc: 1.1 g/dL — ABNORMAL HIGH

## 2022-01-31 LAB — COMPREHENSIVE METABOLIC PANEL
ALT: 13 IU/L (ref 0–44)
AST: 17 IU/L (ref 0–40)
Albumin/Globulin Ratio: 1.5 (ref 1.2–2.2)
Albumin: 4.7 g/dL (ref 3.8–4.8)
Alkaline Phosphatase: 77 IU/L (ref 44–121)
BUN/Creatinine Ratio: 18 (ref 10–24)
BUN: 16 mg/dL (ref 8–27)
Bilirubin Total: 0.3 mg/dL (ref 0.0–1.2)
CO2: 23 mmol/L (ref 20–29)
Calcium: 9.4 mg/dL (ref 8.6–10.2)
Chloride: 101 mmol/L (ref 96–106)
Creatinine, Ser: 0.89 mg/dL (ref 0.76–1.27)
Globulin, Total: 3.1 g/dL (ref 1.5–4.5)
Glucose: 80 mg/dL (ref 70–99)
Potassium: 4.7 mmol/L (ref 3.5–5.2)
Sodium: 137 mmol/L (ref 134–144)
Total Protein: 7.8 g/dL (ref 6.0–8.5)
eGFR: 97 mL/min/{1.73_m2} (ref >59)

## 2022-01-31 LAB — TSH: TSH: 1.27 u[IU]/mL (ref 0.450–4.500)

## 2022-01-31 LAB — ANCA PROFILE
Anti-MPO Antibodies: <0.2 units (ref 0.0–0.9)
Anti-PR3 Antibodies: <0.2 units (ref 0.0–0.9)
Atypical pANCA: <1:20 {titer}
C-ANCA: <1:20 {titer}
P-ANCA: <1:20 {titer}

## 2022-01-31 LAB — HEPATITIS B SURFACE ANTIBODY,QUALITATIVE: Hep B Surface Ab, Qual: NONREACTIVE

## 2022-01-31 LAB — SJOGREN'S SYNDROME ANTIBODS(SSA + SSB)
ENA SSA (RO) Ab: <0.2 AI (ref 0.0–0.9)
ENA SSB (LA) Ab: <0.2 AI (ref 0.0–0.9)

## 2022-01-31 LAB — HOMOCYSTEINE: Homocysteine: 11.5 umol/L (ref 0.0–17.2)

## 2022-01-31 LAB — HIV ANTIBODY (ROUTINE TESTING W REFLEX): HIV Screen 4th Generation wRfx: NONREACTIVE

## 2022-01-31 LAB — ANA W/REFLEX: ANA Titer 1: NEGATIVE

## 2022-01-31 LAB — VITAMIN B6: Vitamin B6: 24.2 ug/L (ref 3.4–65.2)

## 2022-01-31 LAB — HEMOGLOBIN A1C
Est. average glucose Bld gHb Est-mCnc: 111 mg/dL
Hgb A1c MFr Bld: 5.5 % (ref 4.8–5.6)

## 2022-01-31 LAB — HEPATITIS B CORE ANTIBODY, TOTAL: Hep B Core Total Ab: NEGATIVE

## 2022-01-31 LAB — T4, FREE: Free T4: 1.57 ng/dL (ref 0.82–1.77)

## 2022-01-31 LAB — RPR: RPR Ser Ql: NONREACTIVE

## 2022-01-31 LAB — VITAMIN B12: Vitamin B-12: 474 pg/mL (ref 232–1245)

## 2022-01-31 LAB — HEPATITIS B SURFACE ANTIGEN: Hepatitis B Surface Ag: NEGATIVE

## 2022-01-31 LAB — METHYLMALONIC ACID, SERUM: Methylmalonic Acid: 249 nmol/L (ref 0–378)

## 2022-01-31 LAB — HEPATITIS C ANTIBODY: Hep C Virus Ab: NONREACTIVE

## 2022-02-03 ENCOUNTER — Telehealth: Payer: Self-pay | Admitting: Hematology and Oncology

## 2022-02-03 ENCOUNTER — Telehealth: Payer: Self-pay | Admitting: *Deleted

## 2022-02-03 NOTE — Addendum Note (Signed)
Addended by: Andrey Spearman R on: 02/03/2022 02:08 PM   Modules accepted: Orders

## 2022-02-03 NOTE — Telephone Encounter (Signed)
Called patient and informed him his labs showed increase protein (M-protein) and anemia noted. This could be related to neuropathy. Dr Leta Baptist recommends hematology consult, advised him the referral has been placed.  Patient verbalized understanding, appreciation.

## 2022-02-03 NOTE — Telephone Encounter (Signed)
Scheduled appt per 2/20 referral. Pt is aware of appt date and time. Pt is aware to arrive 15 mins prior to appt time and to bring and updated insurance card. Pt is aware of appt location.   °

## 2022-02-06 NOTE — Progress Notes (Signed)
[facility] NOTE  Patient Care Team: Lois Huxley, PA as PCP - General (Family Medicine)  CHIEF COMPLAINTS/PURPOSE OF CONSULTATION:  Elevated monoclonal protein  HISTORY OF PRESENTING ILLNESS:  Shawn [name] 61 y.o. male is here because of recent diagnosis of elevated monoclonal protein. He presents to the clinic today for initial evaluation and discussion of treatment options.  When he was younger he had an injury to his back which did not bother him all the way up until last year when he was moving a Christmas tree and hurt his back.  That led to severe pain in his down his legs but that also resolved.  Lately he has been having severe numbness in bilateral feet and was referred to neurology.  He underwent extensive blood work that revealed an elevated monoclonal protein.  He was referred to [place] for evaluation for myeloma versus MGUS.  I reviewed her records extensively and collaborated the history with the patient.  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-followed by Dr Arnoldo Lenis   Onychomycosis    treated with lamisil in the past   Retinal detachment    right eye caused blindness    SURGICAL HISTORY: Past Surgical History:  Procedure Laterality Date   APPENDECTOMY     CYSTOSCOPY W/ URETERAL STENT PLACEMENT  08/24/2012   Procedure: CYSTOSCOPY WITH RETROGRADE PYELOGRAM/URETERAL STENT PLACEMENT;  Surgeon: Ailene Rud, MD;  Location: [facility];  Service: Urology;  Laterality: Right;   CYSTOSCOPY/RETROGRADE/URETEROSCOPY  08/24/2012   Procedure: CYSTOSCOPY/RETROGRADE/URETEROSCOPY;  Surgeon: Ailene Rud, MD;  Location: [facility];  Service:  Urology;  Laterality: Right;  [place]   CYSTOSCOPY/RETROGRADE/URETEROSCOPY/STONE EXTRACTION WITH BASKET Left 04/11/2013   Procedure: CYSTOSCOPY/RETROGRADE/URETEROSCOPY/STONE EXTRACTION WITH BASKET;  Surgeon: Ailene Rud, MD;  Location: [facility];  Service: Urology;  Laterality: Left;  BASKET STONE EXTRACTION WITH URETEROSCOPE     Right retina detachment      SOCIAL HISTORY: Social History   Socioeconomic History   Marital status: Married    Spouse name: Not on file   Number of children: Not on file   Years of education: Not on file   Highest education level: Not on file  Occupational History   Not on file  Tobacco Use   Smoking status: Former    Types: Pipe   Smokeless tobacco: Never  Substance and Sexual Activity   Alcohol use: Yes    Comment: ocassionally beer or wine   Drug use: No   Sexual activity: Never  Other Topics Concern   Not on file  Social History Narrative   Not on file   Social Determinants of Health   Financial Resource Strain: Not on file  Food Insecurity: Not on file  Transportation Needs: Not on file  Physical Activity: Not on file  Stress: Not on file  Social Connections: Not on file  Intimate Partner Violence: Not on file    FAMILY HISTORY: Family History  Problem Relation Age of Onset   Diabetes Mother    Stroke Mother    Aneurysm Mother    Heart attack  Father    Hypertension Father    CAD Father    Heart disease Father    Aneurysm Paternal Grandmother    Heart attack Paternal Grandfather    Thyroid disease Neg Hx     ALLERGIES:  has No Known Allergies.  MEDICATIONS:  Current Outpatient Medications  Medication Sig Dispense Refill   aspirin 81 MG tablet Take 81 mg by mouth daily as needed (blood pressure).     celecoxib (CELEBREX) 200 MG capsule Take 200 mg by mouth daily as needed.     gabapentin (NEURONTIN) 300 MG capsule Take by mouth.     lisinopril (ZESTRIL) 20 MG tablet Take 1 tablet by  mouth every morning.     metoprolol succinate (TOPROL-XL) 50 MG 24 hr tablet Take 50 mg by mouth daily.     NEEDLE, DISP, 18 G 18G X 1" MISC Use as directed 50 each 0   SYRINGE-NEEDLE, DISP, 3 ML (LUER LOCK SAFETY SYRINGES) 21G X 1-1/2" 3 ML MISC Use as directed for testosterone administration 50 each 0   tadalafil (CIALIS) 20 MG tablet SMARTSIG:0.5-1 Tablet(s) By Mouth (Patient not taking: Reported on 01/27/2022)     testosterone cypionate (DEPOTESTOSTERONE CYPIONATE) 200 MG/ML injection Inject 1 mL (200 mg total) into the muscle every 14 (fourteen) days. 4 mL 0   No current facility-administered medications for this visit.    REVIEW OF SYSTEMS:   Constitutional: Lower extremity aches and pains   Neurological: Severe peripheral neuropathy especially below the ankles bilaterally Behavioral/Psych: Mood is stable, no new changes  All other systems were reviewed with the patient and are negative.  PHYSICAL EXAMINATION: ECOG PERFORMANCE STATUS: 1 - Symptomatic but completely ambulatory  Vitals:   02/07/22 1455  BP: 116/71  Pulse: 97  Resp: 18  Temp: 98.1 F (36.7 C)  SpO2: 98%   Filed Weights   02/07/22 1455  Weight: 194 lb 12.8 oz (88.4 kg)       LABORATORY DATA:  I have reviewed the data as listed Lab Results  Component Value Date   WBC 12.4 (H) 01/27/2022   HGB 11.6 (L) 01/27/2022   HCT 33.9 (L) 01/27/2022   MCV 85 01/27/2022   PLT 389 01/27/2022   Lab Results  Component Value Date   NA 137 01/27/2022   K 4.7 01/27/2022   CL 101 01/27/2022   CO2 23 01/27/2022    RADIOGRAPHIC STUDIES: I have personally reviewed the radiological reports and agreed with the findings in the report.  ASSESSMENT AND PLAN:  MGUS (monoclonal gammopathy of unknown significance) Lab review 01/27/2022: Hemoglobin 11.6, creatinine 0.89, calcium 9.4, B12 474, ANA negative, SPEP: 1.1 g IgG lambda monoclonal protein  Counseling: I discussed with the patient the spectrum of disorders from  MGUS to multiple myeloma. We discussed the role of plasma cells in producing immunoglobulins. We discussed structure of immunoglobulins on how they make up the heavy chains and the light chains. The light chains are Kappa and lambda. I discussed the difference between MGUS and multiple myeloma. MGUS is characterized by elevation monoclonal protein without any end organ damage. Multiple myeloma is associated with elevation monoclonal protein and end organ damage (hypercalcemia, renal dysfunction, anemia, bone lytic lesions ) along with a bone marrow showing greater than 10% plasma cells.  Workup recommended: 1.  Kappa lambda ratio 2. Bone survey There does not appear to be any evidence of hypercalcemia or kidney disease or severe anemia.  I do not believe that the monoclonal gammopathy is causing  his peripheral neuropathy.  Other etiologies will need to be explored including consideration for sural nerve biopsy. He has an appointment with neurology on March 30 to do nerve conduction studies.  I will call him and discuss the results of the blood tests and the bone survey. If both are normal then we will see him once a year with blood work.  All questions were answered. The patient knows to call the clinic with any problems, questions or concerns.   Rulon Eisenmenger, MD, MPH 02/07/2022    I, Thana Ates, am acting as scribe for Nicholas Lose, MD.  I have reviewed the above documentation for accuracy and completeness, and I agree with the above.

## 2022-02-07 ENCOUNTER — Inpatient Hospital Stay: Payer: BLUE CROSS/BLUE SHIELD | Attending: Hematology and Oncology | Admitting: Hematology and Oncology

## 2022-02-07 ENCOUNTER — Inpatient Hospital Stay: Payer: BLUE CROSS/BLUE SHIELD

## 2022-02-07 ENCOUNTER — Other Ambulatory Visit: Payer: Self-pay

## 2022-02-07 VITALS — BP 116/71 | HR 97 | Temp 98.1°F | Resp 18 | Ht 70.5 in | Wt 194.8 lb

## 2022-02-07 DIAGNOSIS — G629 Polyneuropathy, unspecified: Secondary | ICD-10-CM | POA: Diagnosis not present

## 2022-02-07 DIAGNOSIS — D472 Monoclonal gammopathy: Secondary | ICD-10-CM | POA: Diagnosis not present

## 2022-02-07 DIAGNOSIS — K219 Gastro-esophageal reflux disease without esophagitis: Secondary | ICD-10-CM | POA: Diagnosis not present

## 2022-02-07 DIAGNOSIS — Z791 Long term (current) use of non-steroidal anti-inflammatories (NSAID): Secondary | ICD-10-CM | POA: Diagnosis not present

## 2022-02-07 DIAGNOSIS — I10 Essential (primary) hypertension: Secondary | ICD-10-CM | POA: Diagnosis not present

## 2022-02-07 DIAGNOSIS — Z79899 Other long term (current) drug therapy: Secondary | ICD-10-CM | POA: Diagnosis not present

## 2022-02-07 DIAGNOSIS — H5461 Unqualified visual loss, right eye, normal vision left eye: Secondary | ICD-10-CM | POA: Diagnosis not present

## 2022-02-07 DIAGNOSIS — H8109 Meniere's disease, unspecified ear: Secondary | ICD-10-CM | POA: Diagnosis not present

## 2022-02-07 NOTE — Assessment & Plan Note (Signed)
Lab review 01/27/2022: Hemoglobin 11.6, creatinine 0.89, calcium 9.4, B12 474, ANA negative, SPEP: 1.1 g IgG lambda monoclonal protein  Counseling: I discussed with the patient the spectrum of disorders from MGUS to multiple myeloma. We discussed the role of plasma cells in producing immunoglobulins. We discussed structure of immunoglobulins on how they make up the heavy chains and the light chains. The light chains are Kappa and lambda. I discussed the difference between MGUS and multiple myeloma. MGUS is characterized by elevation monoclonal protein without any end organ damage. Multiple myeloma is associated with elevation monoclonal protein and end organ damage (hypercalcemia, renal dysfunction, anemia, bone lytic lesions ) along with a bone marrow showing greater than 10% plasma cells.  Workup recommended: 1.  Kappa lambda ratio 2. Bone survey There does not appear to be any evidence of hypercalcemia or kidney disease or severe anemia.  I do not believe that the monoclonal gammopathy is causing his peripheral neuropathy.  Other etiologies will need to be explored including consideration for sural nerve biopsy.

## 2022-02-08 LAB — BETA 2 MICROGLOBULIN, SERUM: Beta-2 Microglobulin: 1.9 mg/L (ref 0.6–2.4)

## 2022-02-10 LAB — KAPPA/LAMBDA LIGHT CHAINS
Kappa free light chain: 23.4 mg/L — ABNORMAL HIGH (ref 3.3–19.4)
Kappa, lambda light chain ratio: 1.35 (ref 0.26–1.65)
Lambda free light chains: 17.3 mg/L (ref 5.7–26.3)

## 2022-02-12 NOTE — Assessment & Plan Note (Signed)
Lab review ?01/27/2022: Hemoglobin 11.6, creatinine 0.89, calcium 9.4, B12 474, ANA negative, SPEP: 1.1 g IgG lambda monoclonal protein ?02/07/22: Kappa: 23, Lambda 17, ratio: 1.35, Beta 2 microglobulin: 1.9,  ? ?Bone Survey: Pending ? ?

## 2022-02-12 NOTE — Progress Notes (Signed)
?  HEMATOLOGY-ONCOLOGY TELEPHONE VISIT PROGRESS NOTE ? ?I connected with Shawn [name] on 02/13/2022 at 10:00 AM EST by telephone and verified that I am speaking with the correct person using two identifiers.  ?I discussed the limitations, risks, security and privacy concerns of performing an evaluation and management service by telephone and the availability of in person appointments.  ?I also discussed with the patient that there may be a patient responsible charge related to this service. The patient expressed understanding and agreed to proceed.  ? ?History of Present Illness: Shawn [name] is a 61 y.o. male with above-mentioned history of monoclonal protein. He presents via telephone today for follow-up. C/O back pains severe ? ?Observations/Objective:  ?  ?Assessment Plan:  ?MGUS (monoclonal gammopathy of unknown significance) ?Lab review ?01/27/2022: Hemoglobin 11.6, creatinine 0.89, calcium 9.4, B12 474, ANA negative, SPEP: 1.1 g IgG lambda monoclonal protein ?02/07/22: Kappa: 23, Lambda 17, ratio: 1.35, Beta 2 microglobulin: 1.9,  ?Acute on chronic back pain: Possibly due to spinal stenosis/arthritis. ?Bone Survey: Pending ?RTC in 1 year with labs done ahead of the visit ? ? ?I discussed the assessment and treatment plan with the patient. The patient was provided an opportunity to ask questions and all were answered. The patient agreed with the plan and demonstrated an understanding of the instructions. The patient was advised to call back or seek an in-person evaluation if the symptoms worsen or if the condition fails to improve as anticipated.  ? ?Total time spent: 15 mins including non-face to face time and time spent for planning, charting and coordination of care ? ?Rulon Eisenmenger, MD ?02/13/2022  ? ? I, Thana Ates, am acting as scribe for Nicholas Lose, MD. ? ?I have reviewed the above documentation for accuracy and completeness, and I agree with the above. ?  ? ?

## 2022-02-13 ENCOUNTER — Inpatient Hospital Stay: Payer: BLUE CROSS/BLUE SHIELD | Attending: Hematology and Oncology | Admitting: Hematology and Oncology

## 2022-02-13 ENCOUNTER — Telehealth: Payer: Self-pay | Admitting: *Deleted

## 2022-02-13 DIAGNOSIS — D472 Monoclonal gammopathy: Secondary | ICD-10-CM | POA: Diagnosis not present

## 2022-02-13 NOTE — Telephone Encounter (Signed)
Per MD request, RN scheduled pt bone survey for Monday 02/17/22 at 10am. RN attempt to contact pt.  No answer, LVM with appt date and time of bone survey.  ?

## 2022-02-17 ENCOUNTER — Ambulatory Visit (HOSPITAL_COMMUNITY)
Admission: RE | Admit: 2022-02-17 | Discharge: 2022-02-17 | Disposition: A | Discharge status: Home / Self Care | Payer: BLUE CROSS/BLUE SHIELD | Source: Ambulatory Visit | Attending: Hematology and Oncology | Admitting: Hematology and Oncology

## 2022-02-17 ENCOUNTER — Other Ambulatory Visit: Payer: Self-pay

## 2022-02-17 DIAGNOSIS — D472 Monoclonal gammopathy: Secondary | ICD-10-CM | POA: Diagnosis not present

## 2022-03-10 ENCOUNTER — Other Ambulatory Visit: Payer: Self-pay

## 2022-03-10 DIAGNOSIS — E349 Endocrine disorder, unspecified: Secondary | ICD-10-CM

## 2022-03-11 ENCOUNTER — Other Ambulatory Visit: Payer: Self-pay

## 2022-03-11 ENCOUNTER — Other Ambulatory Visit: Payer: BLUE CROSS/BLUE SHIELD

## 2022-03-11 DIAGNOSIS — E349 Endocrine disorder, unspecified: Secondary | ICD-10-CM

## 2022-03-12 LAB — TESTOSTERONE: Testosterone: >1500 ng/dL — ABNORMAL HIGH (ref 264–916)

## 2022-03-13 ENCOUNTER — Encounter (INDEPENDENT_AMBULATORY_CARE_PROVIDER_SITE_OTHER): Payer: BLUE CROSS/BLUE SHIELD | Admitting: Diagnostic Neuroimaging

## 2022-03-13 ENCOUNTER — Telehealth: Payer: Self-pay | Admitting: Urology

## 2022-03-13 ENCOUNTER — Ambulatory Visit (INDEPENDENT_AMBULATORY_CARE_PROVIDER_SITE_OTHER): Payer: BLUE CROSS/BLUE SHIELD | Admitting: Diagnostic Neuroimaging

## 2022-03-13 DIAGNOSIS — R200 Anesthesia of skin: Secondary | ICD-10-CM | POA: Diagnosis not present

## 2022-03-13 DIAGNOSIS — R29898 Other symptoms and signs involving the musculoskeletal system: Secondary | ICD-10-CM | POA: Diagnosis not present

## 2022-03-13 DIAGNOSIS — Z0289 Encounter for other administrative examinations: Secondary | ICD-10-CM

## 2022-03-13 NOTE — Telephone Encounter (Signed)
T level was > 1500. When was his last injection? ?

## 2022-03-13 NOTE — Progress Notes (Signed)
Now with more progressive muscle weakness in last 1 month. (Hips, thighs). ? ?Check labs and LP. Then then consider nerve and muscle biopsy. Need to consider AMSAN, CIDP vs myopathy. ? ?Orders Placed This Encounter  ?Procedures  ? DG FL GUIDED LUMBAR PUNCTURE  ? AChR Abs with Reflex to MuSK  ? CK  ? Aldolase  ? ? ?Penni Bombard, MD 6/42/9037, 9:55 PM ?Certified in Neurology, Neurophysiology and Neuroimaging ? ?[facility] ?[address] ?[place], [place] [address] ?[phone] ? ?

## 2022-03-14 ENCOUNTER — Encounter: Payer: Self-pay | Admitting: Family Medicine

## 2022-03-14 NOTE — Procedures (Signed)
? ?[facility] ? ?NCS (NERVE CONDUCTION STUDY) WITH EMG (ELECTROMYOGRAPHY) REPORT ? ? ?STUDY DATE: 03/13/22 ?PATIENT NAME: Shawn [name] ?DOB: 02-06-1961 ?MRN: [id number] ? ?ORDERING CLINICIAN: Andrey Spearman, MD  ? ?TECHNOLOGIST: Sherre Scarlet ?ELECTROMYOGRAPHER: Etan Vasudevan R. Mamadou Breon, MD ? ?CLINICAL INFORMATION: 61 year old male with lower extremity numbness and weakness. ? ?FINDINGS: ?NERVE CONDUCTION STUDY: ? ?Right ulnar motor response is normal. ? ?Bilateral peroneal motor responses cannot be obtained. ? ?Bilateral tibial motor responses abnormal distal latencies, decreased amplitudes and normal conduction velocities. ? ?Right ulnar sensory response is normal. ? ?Bilateral sural and superficial peroneal sensory responses could not be obtained. ? ?Bilateral tibial F wave latencies are prolonged. ? ?Right ulnar F-wave latency is normal. ? ? ? ?NEEDLE ELECTROMYOGRAPHY: ? ?Needle examination of right lower extremity and bilateral lumbar paraspinal muscles unremarkable except for rare spontaneous activity in right lumbar paraspinal and right gastrocnemius muscles at rest and 1+ positive sharp waves in right tibialis anterior muscle at rest.  Normal motor unit recruitment. ? ? ?IMPRESSION:  ? ?Abnormal study demonstrating: ?-Severe length dependent axonal sensorimotor polyneuropathy. ?-Mild evidence of bilateral lumbar radiculopathies. ? ? ? ?INTERPRETING PHYSICIAN:  ?Penni Bombard, MD ?Certified in Neurology, Neurophysiology and Neuroimaging ? ?[facility] ?[address] ?[place], [place] [address] ?([phone] ? ? ?[place] ?   ?Nerve / Sites Muscle Latency Ref. Amplitude Ref. Rel Amp Segments Distance Velocity Ref. Area  ?  ms ms mV mV %  cm m/s m/s mVms  ?R Ulnar - ADM  ?   Wrist ADM 2.9 ?3.3 11.0 ?6.0 100 Wrist - ADM 7   39.7  ?   B.Elbow ADM 7.0  10.7  97.6 B.Elbow - Wrist 21 52 ?49 39.3  ?   A.Elbow ADM 8.9  10.5  98.2 A.Elbow - B.Elbow 10 52 ?49 38.4  ?R Peroneal -  EDB  ?   Ankle EDB NR ?6.5 NR ?2.0 NR Ankle - EDB 9   NR  ?   Fib head EDB NR  NR  NR Fib head - Ankle 29 NR ?44 NR  ?   Pop fossa EDB NR  NR  NR Pop fossa - Fib head 10 NR ?44 NR  ?       Pop fossa - Ankle      ?L Peroneal - EDB  ?   Ankle EDB NR ?6.5 NR ?2.0 NR Ankle - EDB 9   NR  ?   Fib head EDB NR  NR  NR Fib head - Ankle 29 NR ?44 NR  ?   Pop fossa EDB NR  NR  NR Pop fossa - Fib head 10 NR ?44 NR  ?       Pop fossa - Ankle      ?R Tibial - AH  ?   Ankle AH 4.5 ?5.8 1.5 ?4.0 100 Ankle - AH 9   4.5  ?   Pop fossa AH 14.8  0.9  61.6 Pop fossa - Ankle 42 41 ?41 4.0  ?L Tibial - AH  ?   Ankle AH 4.6 ?5.8 2.0 ?4.0 100 Ankle - AH 9   5.2  ?   Pop fossa AH 15.1  1.3  62.7 Pop fossa - Ankle 43 41 ?41 2.6  ?             ?[place] ?   ?Nerve / Sites Rec. Site Peak Lat Ref.  Amp Ref. Segments Distance  ?  ms ms ?V ?V  cm  ?R Sural - Ankle (Calf)  ?   Calf Ankle NR ?4.4 NR ?6 Calf - Ankle 14  ?L Sural - Ankle (Calf)  ?   Calf Ankle NR ?4.4 NR ?6 Calf - Ankle 14  ?R Superficial peroneal - Ankle  ?   Lat leg Ankle NR ?4.4 NR ?6 Lat leg - Ankle 14  ?L Superficial peroneal - Ankle  ?   Lat leg Ankle NR ?4.4 NR ?6 Lat leg - Ankle 14  ?L Ulnar - Orthodromic, (Dig V, Mid palm)  ?   Dig V Wrist 2.6 ?3.1 5 ?5 Dig V - Wrist 11  ?             ?F  Wave ?   ?Nerve F Lat Ref.  ? ms ms  ?R Tibial - AH 60.7 ?56.0  ?L Tibial - AH 61.0 ?56.0  ?R Ulnar - ADM 30.5 ?32.0  ?         ?EMG Summary Table   ? Spontaneous MUAP Recruitment  ?Muscle IA Fib PSW Fasc Other Amp Dur. Poly Pattern  ?R. Vastus medialis Normal None None None _______ Normal Normal Normal Normal  ?R. Tibialis anterior Normal rare 1+ None _______ Normal Normal Normal Normal  ?R. Gastrocnemius (Medial head) Normal None rare None _______ Normal Normal Normal Normal  ?R. Lumbar paraspinals Normal None rare None _______ Normal Normal Normal Normal  ?L. Lumbar paraspinals Normal None None None _______ Normal Normal Normal Normal  ? ?  ?

## 2022-03-19 ENCOUNTER — Other Ambulatory Visit: Payer: Self-pay | Admitting: *Deleted

## 2022-03-19 DIAGNOSIS — E349 Endocrine disorder, unspecified: Secondary | ICD-10-CM

## 2022-03-20 ENCOUNTER — Other Ambulatory Visit: Payer: BLUE CROSS/BLUE SHIELD

## 2022-03-20 DIAGNOSIS — E349 Endocrine disorder, unspecified: Secondary | ICD-10-CM

## 2022-03-21 LAB — TESTOSTERONE: Testosterone: 124 ng/dL — ABNORMAL LOW (ref 264–916)

## 2022-03-22 ENCOUNTER — Other Ambulatory Visit: Payer: Self-pay | Admitting: Urology

## 2022-03-22 LAB — ALDOLASE: Aldolase: 4.2 U/L (ref 3.3–10.3)

## 2022-03-22 LAB — ACHR ABS WITH REFLEX TO MUSK: AChR Binding Ab, Serum: 0.04 nmol/L (ref 0.00–0.24)

## 2022-03-22 LAB — MUSK ANTIBODIES: MuSK Antibodies: <1 U/mL

## 2022-03-22 LAB — CK: Total CK: 146 U/L (ref 41–331)

## 2022-03-24 ENCOUNTER — Telehealth: Payer: Self-pay | Admitting: *Deleted

## 2022-03-24 NOTE — Telephone Encounter (Signed)
-----   Message from Abbie Sons, MD sent at 03/23/2022  1:46 PM EDT ----- ?Trough level was low- continue present dose. Sched office visit with testosterone level, PSA and hct  ?

## 2022-03-25 ENCOUNTER — Encounter: Payer: Self-pay | Admitting: *Deleted

## 2022-03-25 ENCOUNTER — Other Ambulatory Visit: Payer: Self-pay | Admitting: *Deleted

## 2022-03-25 NOTE — Telephone Encounter (Signed)
When do you want  scheduled the labs and office visit. Per patient . Please on off on medication .  ?

## 2022-03-25 NOTE — Telephone Encounter (Signed)
Schedule for June 2023.  Draw blood work between 1-7 days before his next injection is due ?

## 2022-03-26 NOTE — Telephone Encounter (Signed)
Notified patient as instructed, patient pleased. Discussed follow-up appointments, patient agrees  

## 2022-04-18 DIAGNOSIS — M48062 Spinal stenosis, lumbar region with neurogenic claudication: Secondary | ICD-10-CM | POA: Diagnosis not present

## 2022-04-18 DIAGNOSIS — Z6826 Body mass index (BMI) 26.0-26.9, adult: Secondary | ICD-10-CM | POA: Diagnosis not present

## 2022-04-18 DIAGNOSIS — M792 Neuralgia and neuritis, unspecified: Secondary | ICD-10-CM | POA: Diagnosis not present

## 2022-04-23 ENCOUNTER — Other Ambulatory Visit: Payer: Self-pay | Admitting: Neurological Surgery

## 2022-04-24 ENCOUNTER — Other Ambulatory Visit: Payer: Self-pay | Admitting: Neurological Surgery

## 2022-05-01 ENCOUNTER — Ambulatory Visit (HOSPITAL_COMMUNITY)
Admission: RE | Admit: 2022-05-01 | Discharge: 2022-05-01 | Disposition: A | Discharge status: Home / Self Care | Payer: BLUE CROSS/BLUE SHIELD | Source: Ambulatory Visit | Attending: Neurological Surgery | Admitting: Neurological Surgery

## 2022-05-01 DIAGNOSIS — M792 Neuralgia and neuritis, unspecified: Secondary | ICD-10-CM | POA: Diagnosis not present

## 2022-05-01 DIAGNOSIS — G609 Hereditary and idiopathic neuropathy, unspecified: Secondary | ICD-10-CM | POA: Insufficient documentation

## 2022-05-01 DIAGNOSIS — R836 Abnormal cytological findings in cerebrospinal fluid: Secondary | ICD-10-CM | POA: Diagnosis not present

## 2022-05-01 DIAGNOSIS — D472 Monoclonal gammopathy: Secondary | ICD-10-CM

## 2022-05-01 LAB — PROTEIN AND GLUCOSE, CSF
Glucose, CSF: 60 mg/dL (ref 40–70)
Total  Protein, CSF: 43 mg/dL (ref 15–45)

## 2022-05-01 NOTE — Procedures (Signed)
Patient presented for lumbar puncture to obtain fluid (csf) for analysis of neuropathy of unknown origin.  Procedure: With patient in left lateral decubitus position, back was prepped with betadine and skin numbed with 1 cc lidocaine at palpated L3-4 interspace.Single stick of 20 gauge spinal needle revealed clear colorless spinal fluid with opening pressure of 150 mm h20. Removed 32 cc of csf and sent in4 separate tubes for study. Closing pressure 60 mm h2o. Patient tolerated procedure well.

## 2022-05-02 LAB — CSF CELL COUNT WITH DIFFERENTIAL
RBC Count, CSF: 2 /mm3 — ABNORMAL HIGH
Tube #: 3
WBC, CSF: 2 /mm3 (ref 0–5)

## 2022-05-02 LAB — CYTOLOGY - NON PAP

## 2022-05-05 LAB — IGG CSF INDEX
Albumin CSF-mCnc: 23 mg/dL (ref 15–55)
Albumin: 4.2 g/dL (ref 3.8–4.8)
CSF IgG Index: 0.4 (ref 0.0–0.7)
IgG (Immunoglobin G), Serum: 1647 mg/dL — ABNORMAL HIGH (ref 603–1613)
IgG, CSF: 3.9 mg/dL (ref 0.0–10.3)
IgG/Alb Ratio, CSF: 0.17 (ref 0.00–0.25)

## 2022-05-06 LAB — OLIGOCLONAL BANDS, CSF + SERM

## 2022-05-13 ENCOUNTER — Telehealth: Payer: Self-pay

## 2022-05-13 NOTE — Telephone Encounter (Signed)
Notes scanned to referral 

## 2022-05-26 ENCOUNTER — Other Ambulatory Visit: Payer: Self-pay | Admitting: *Deleted

## 2022-05-26 ENCOUNTER — Other Ambulatory Visit: Payer: BLUE CROSS/BLUE SHIELD

## 2022-05-26 DIAGNOSIS — E349 Endocrine disorder, unspecified: Secondary | ICD-10-CM

## 2022-05-27 ENCOUNTER — Other Ambulatory Visit: Payer: Self-pay

## 2022-05-27 ENCOUNTER — Other Ambulatory Visit: Payer: BLUE CROSS/BLUE SHIELD

## 2022-05-27 ENCOUNTER — Telehealth: Payer: Self-pay | Admitting: Diagnostic Neuroimaging

## 2022-05-27 DIAGNOSIS — E349 Endocrine disorder, unspecified: Secondary | ICD-10-CM | POA: Diagnosis not present

## 2022-05-27 NOTE — Telephone Encounter (Signed)
Pt called stating that Dr. Ellene Route informed him that the lab results that he received are needing to be reviewed by a Neurologist. Pt states Dr. Clarice Pole office has sent over the results. Pt would like to know what is the next step. Please advise.

## 2022-05-28 LAB — TESTOSTERONE: Testosterone: 587 ng/dL (ref 264–916)

## 2022-05-28 LAB — HEMATOCRIT: Hematocrit: 37.7 % (ref 37.5–51.0)

## 2022-05-28 LAB — PSA: Prostate Specific Ag, Serum: 0.6 ng/mL (ref 0.0–4.0)

## 2022-05-28 NOTE — Telephone Encounter (Signed)
Received CSF  lab results from Dr Ellene Route. Placed on Dr [employer] desk for review.

## 2022-05-30 ENCOUNTER — Telehealth: Payer: Self-pay | Admitting: *Deleted

## 2022-05-30 ENCOUNTER — Ambulatory Visit: Payer: BLUE CROSS/BLUE SHIELD | Admitting: Urology

## 2022-05-30 NOTE — Telephone Encounter (Signed)
Pt had appt scheduled for today at 11:30, pt was going to be more than 50mn late so appt was rescheduled. Pt had testosterone done and will need rx for injection on Saturday. Pt will need refill of testosterone. Rx pended Please advise

## 2022-06-02 MED ORDER — TESTOSTERONE CYPIONATE 200 MG/ML IM SOLN: INTRAMUSCULAR | 0 refills | Status: DC

## 2022-06-02 NOTE — Telephone Encounter (Signed)
Received new referral from Dr. Ellene Route for neuropathic pain along with LP results. Should I go ahead and schedule patient for a follow up or how should I proceed, thank you!

## 2022-06-02 NOTE — Telephone Encounter (Signed)
Okay to schedule for next available 7/16? And should I let patient know he will receive a call going over CSF lab results before appointment?

## 2022-06-03 ENCOUNTER — Other Ambulatory Visit: Payer: Self-pay | Admitting: Family Medicine

## 2022-06-03 MED ORDER — "LUER LOCK SAFETY SYRINGES 21G X 1-1/2"" 3 ML MISC": 0 refills | Status: DC

## 2022-06-04 ENCOUNTER — Ambulatory Visit (INDEPENDENT_AMBULATORY_CARE_PROVIDER_SITE_OTHER): Payer: BLUE CROSS/BLUE SHIELD | Admitting: Urology

## 2022-06-04 ENCOUNTER — Encounter: Payer: Self-pay | Admitting: Urology

## 2022-06-04 VITALS — BP 132/82 | HR 91 | Ht 70.5 in | Wt 192.0 lb

## 2022-06-04 DIAGNOSIS — E349 Endocrine disorder, unspecified: Secondary | ICD-10-CM

## 2022-06-04 DIAGNOSIS — N5201 Erectile dysfunction due to arterial insufficiency: Secondary | ICD-10-CM | POA: Diagnosis not present

## 2022-06-04 DIAGNOSIS — E291 Testicular hypofunction: Secondary | ICD-10-CM | POA: Diagnosis not present

## 2022-06-04 MED ORDER — TADALAFIL 5 MG PO TABS: 5.0000 mg | ORAL_TABLET | Freq: Every day | ORAL | 0 refills | Status: DC | PRN

## 2022-06-04 NOTE — Telephone Encounter (Signed)
Called patient and discussed Dr Gladstone Lighter result note: CSF labs are ok. No specific diagnosis for neuropathy found, other than monoclonal protein (which is being worked up by dr Lindi Adie).  Keep appt. I will review further. May sural nerve biopsy. May setup quick video consult.   The patient stated he will see PCP 7/28 for ongoing neck pain, and he was told by PCP to see Dr Lindi Adie again re: CSF results. Patient will call Dr Lindi Adie and discuss CSF labs, then pending Dr Geralyn Flash visit  he will call us back to schedule VV with Dr Leta Baptist to discuss possible biopsy. Patient verbalized understanding, appreciation.

## 2022-06-04 NOTE — Progress Notes (Signed)
06/04/2022 2:11 PM   Shawn [name] 1961/04/25 [id number]  Referring provider: Lois Huxley, [place],  [place] [address]  Chief Complaint  Patient presents with   Hypogonadism    HPI: 61 y.o. male presents for follow-up of hypogonadism.  Initially seen 01/2022 for hypogonadism.  Previously on TRT with a topical preparation Complained of profound tiredness/fatigue and loss of muscle mass Testosterone level was 71 with mild LH elevation Started on intramuscular injections T level 03/11/2022 was 1500.  No dosage adjustment was made because trough level 124  Most recent labs 05/27/2022: Testosterone 587; HCT 37.7; PSA 0.6 Energy level and fatigue have improved Complains of persistent ED.  No improvement on PDE 5 inhibitors since he has been on TRT. 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-followed by Dr Arnoldo Lenis   Onychomycosis    treated with lamisil in the past   Retinal detachment    right eye caused blindness    Surgical History: Past Surgical History:  Procedure Laterality Date   APPENDECTOMY     CYSTOSCOPY W/ URETERAL STENT PLACEMENT  08/24/2012   Procedure: CYSTOSCOPY WITH RETROGRADE PYELOGRAM/URETERAL STENT PLACEMENT;  Surgeon: Ailene Rud, MD;  Location: [facility];  Service: Urology;  Laterality: Right;   CYSTOSCOPY/RETROGRADE/URETEROSCOPY  08/24/2012   Procedure: CYSTOSCOPY/RETROGRADE/URETEROSCOPY;  Surgeon: Ailene Rud, MD;  Location: [facility];  Service: Urology;  Laterality: Right;  [place]   CYSTOSCOPY/RETROGRADE/URETEROSCOPY/STONE EXTRACTION WITH BASKET Left  04/11/2013   Procedure: CYSTOSCOPY/RETROGRADE/URETEROSCOPY/STONE EXTRACTION WITH BASKET;  Surgeon: Ailene Rud, MD;  Location: [facility];  Service: Urology;  Laterality: Left;  BASKET STONE EXTRACTION WITH URETEROSCOPE     Right retina detachment      Home Medications:  Allergies as of 06/04/2022   No Known Allergies      Medication List        Accurate as of June 04, 2022  2:11 PM. If you have any questions, ask your nurse or doctor.          celecoxib 200 MG capsule Commonly known as: CELEBREX Take 200 mg by mouth daily as needed.   gabapentin 300 MG capsule Commonly known as: NEURONTIN Take by mouth.   lisinopril 20 MG tablet Commonly known as: ZESTRIL Take 1 tablet by mouth every morning.   Luer Lock Safety Syringes 21G X 1-1/2" 3 ML Misc Generic drug: SYRINGE-NEEDLE (DISP) 3 ML Use as directed for testosterone administration   metoprolol succinate 50 MG 24 hr tablet Commonly known as: TOPROL-XL Take 50 mg by mouth daily.   NEEDLE (DISP) 18 G 18G X 1" Misc Use as directed   testosterone cypionate 200 MG/ML injection Commonly known as: DEPOTESTOSTERONE CYPIONATE INJECT 1 ML (200 MG TOTAL) INTO THE MUSCLE EVERY 14 DAYS        Allergies: No Known Allergies  Family History: Family History  Problem Relation Age of Onset   Diabetes Mother    Stroke Mother    Aneurysm Mother    Heart attack Father    Hypertension Father  CAD Father    Heart disease Father    Aneurysm Paternal Grandmother    Heart attack Paternal Grandfather    Thyroid disease Neg Hx     Social History:  reports that he has quit smoking. His smoking use included pipe. He has never used smokeless tobacco. He reports current alcohol use. He reports that he does not use drugs.   Physical Exam: BP 132/82   Pulse 91   Ht 5' 10.5" (1.791 m)   Wt 192 lb (87.1 kg)   BMI 27.16 kg/m   Constitutional:  Alert and oriented, No acute distress. HEENT: [place]  AT Respiratory: Normal respiratory effort, no increased work of breathing. Psychiatric: Normal mood and affect.   Assessment & Plan:    1.  Hypogonadism Symptomatic improvement on TRT Lab visit 6 months testosterone, hematocrit Office visit 1 year testosterone, hematocrit, PSA  2.  Erectile dysfunction He was inquiring about a daily medication dose Discussed daily tadalafil 5 mg although unlikely this will be any more effective than demand tadalafil dosing 1 month Rx sent to pharmacy to try. Intracavernosal injections were discussed   Abbie Sons, MD  [facility] [address], [place], [place] [address] [phone]

## 2022-06-05 ENCOUNTER — Encounter: Payer: Self-pay | Admitting: Urology

## 2022-06-11 NOTE — Progress Notes (Signed)
Cardiology Office Note:    Date:  06/12/2022   ID:  Kameel Taitano, DOB [birth year], MRN [id number]  PCP:  Wilfrid Lund, PA   [facility] Providers Cardiologist:  Christell Constant, MD     Referring MD: Wilfrid Lund, PA   CC: Leg pain Consulted for the evaluation of atrial tachycardia at the behest of Ms. Donalee Citrin  History of Present Illness:    Neymar Theesfeld is a 61 y.o. male with a hx of AT NOS, MGUS HTN, who presents 06/12/22.  Patient notes that he is having leg pain and weakness for about 6 months.  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  Is being evalted for peripheral neuropathy.  Worse waking up hills.  Through his evaluation he has been diagnosed with MGUS (presently with normal calcium, normal Hgb).    He has had runs of palpitations for years which has had worsened.  Been diagnosed with ectopic atrial rhythm and was on 25 mg metoprolol.  Recently increased to 50 mg with improvement in symptoms, still have sx every few days at least.    Notes that he has had 20 lbs + of unexpected weight loss.  Rare trouble breathing.  New low blood pressure.  Rare chest pain.  Overall has a lot of questions on how to get back to feeling normal.  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-followed by Dr Jason Fila   Multiple thyroid nodules    Onychomycosis    treated with lamisil  in the past   Other chronic pain    Pain in right hand    Palpitations    Prediabetes    Pruritus    Retinal detachment    right eye caused blindness   Tinea cruris    Tinnitus    Total retinal detachment of right eye     Past Surgical History:  Procedure Laterality Date   APPENDECTOMY     CYSTOSCOPY W/ URETERAL STENT PLACEMENT  08/24/2012   Procedure: CYSTOSCOPY WITH RETROGRADE PYELOGRAM/URETERAL STENT PLACEMENT;  Surgeon: Kathi Ludwig, MD;  Location: [facility];  Service: Urology;  Laterality: Right;   CYSTOSCOPY/RETROGRADE/URETEROSCOPY  08/24/2012   Procedure: CYSTOSCOPY/RETROGRADE/URETEROSCOPY;  Surgeon: Kathi Ludwig, MD;  Location: [facility];  Service: Urology;  Laterality: Right;  Stone Basketing   CYSTOSCOPY/RETROGRADE/URETEROSCOPY/STONE EXTRACTION WITH BASKET Left 04/11/2013   Procedure: CYSTOSCOPY/RETROGRADE/URETEROSCOPY/STONE EXTRACTION WITH BASKET;  Surgeon: Kathi Ludwig, MD;  Location: [facility];  Service: Urology;  Laterality: Left;  BASKET STONE EXTRACTION WITH URETEROSCOPE     Right retina detachment      Current Medications: Current Meds  Medication Sig   celecoxib (CELEBREX) 200 MG capsule Take 200 mg by mouth 2 (two) times daily.   gabapentin (  NEURONTIN) 300 MG capsule Take by mouth. Take 1 tab in the morning and 2 tabs in the evening   lisinopril (ZESTRIL) 20 MG tablet Take 1 tablet by mouth every morning.   metoprolol succinate (TOPROL-XL) 50 MG 24 hr tablet Take 50 mg by mouth daily.   NEEDLE, DISP, 18 G 18G X 1" MISC Use as directed   SYRINGE-NEEDLE, DISP, 3 ML (LUER LOCK SAFETY SYRINGES) 21G X 1-1/2" 3 ML MISC Use as directed for testosterone administration   tadalafil (CIALIS) 5 MG tablet Take 1 tablet (5 mg total) by mouth daily as needed for erectile dysfunction.   testosterone cypionate (DEPOTESTOSTERONE CYPIONATE) 200 MG/ML injection INJECT 1 ML (200 MG TOTAL) INTO THE MUSCLE EVERY 14 DAYS     Allergies:   Patient has  no known allergies.   Social History   Socioeconomic History   Marital status: Married    Spouse name: Not on file   Number of children: Not on file   Years of education: Not on file   Highest education level: Not on file  Occupational History   Not on file  Tobacco Use   Smoking status: Former    Types: Pipe   Smokeless tobacco: Never  Substance and Sexual Activity   Alcohol use: Yes    Comment: ocassionally beer or wine   Drug use: No   Sexual activity: Never  Other Topics Concern   Not on file  Social History Narrative   Not on file   Social Determinants of Health   Financial Resource Strain: Not on file  Food Insecurity: Not on file  Transportation Needs: Not on file  Physical Activity: Not on file  Stress: Not on file  Social Connections: Not on file    Social: Church friends of Ulyess Blossom  Family History: The patient's family history includes Aneurysm in his mother and paternal grandmother; Bladder Cancer in his brother; CAD in his father; Diabetes in his mother; Heart attack in his father and paternal grandfather; Heart disease in his father; Hypertension in his father; Stroke in his mother. There is no history of Thyroid disease.  ROS:   Please see the history of present illness.     All other systems reviewed and are negative.  EKGs/Labs/Other Studies Reviewed:    The following studies were reviewed today:  EKG:  EKG is  ordered today.  The ekg ordered today demonstrates  06/12/22: SR 90   Recent Labs: 01/27/2022: ALT 13; BUN 16; Creatinine, Ser 0.89; Hemoglobin 11.6; Platelets 389; Potassium 4.7; Sodium 137; TSH 1.270  Recent Lipid Panel No results found for: "CHOL", "TRIG", "HDL", "CHOLHDL", "VLDL", "LDLCALC", "LDLDIRECT"      Physical Exam:    VS:  BP 98/64   Pulse 96   Ht 6' (1.829 m)   Wt 191 lb (86.6 kg)   SpO2 97%   BMI 25.90 kg/m     Wt Readings from Last 3 Encounters:  06/12/22 191 lb (86.6 kg)  06/04/22 192 lb (87.1 kg)   02/07/22 194 lb 12.8 oz (88.4 kg)    Gen: no distress   Neck: No JVD Cardiac: No Rubs or Gallops, no Murmur, RRR cardia, +2 radial pulses Respiratory: Clear to auscultation bilaterally, normal effort, normal  respiratory rate GI: Soft, nontender, non-distended  MS: +1 bilateral edema;  moves all extremities Integument: Skin feels warm Neuro:  At time of evaluation, alert and oriented to person/place/time/situation  Psych: Normal affect, patient feels ok   ASSESSMENT:  1. Claudication of both lower extremities (HCC)   2. Atrial tachycardia (HCC)   3. MGUS (monoclonal gammopathy of unknown significance)   4. Unexplained weight loss   5. Lower extremity edema    PLAN:    Bilateral leg pain and weakness - I worry more about lumbar spinal stenosis, but given his sx and 6 months without answers, will get Vascular Duplex and ABIs as if we diagnosed this we could greatly improve his outcomes  AT and palpitations - I am unclear that this is AT or not, he still have sx, will get a 1 week ziopatch - if he has AF we will anticoagulation him - if worsening SVT we will get Cardiac CT and consider flecainide start   MGUS, Weight loss, DOE, LE edema - will get BNP and echo, we may stop lisinopril to add lasix 20 mg PO daily based on results - overall constellation has be concerned for AL Amyloidosis, low threshold for CMR  3-4 months with me (not APP)        Medication Adjustments/Labs and Tests Ordered: Current medicines are reviewed at length with the patient today.  Concerns regarding medicines are outlined above.  Orders Placed This Encounter  Procedures   CBC   Pro b natriuretic peptide (BNP)   EKG 12-Lead   ECHOCARDIOGRAM COMPLETE   VAS [place] ABI WITH/WO TBI   VAS [place] LOWER EXTREMITY ARTERIAL DUPLEX   No orders of the defined types were placed in this encounter.   Patient Instructions  Medication Instructions:  Your physician recommends that you continue on your current  medications as directed. Please refer to the Current Medication list given to you today.  *If you need a refill on your cardiac medications before your next appointment, please call your pharmacy*   Lab Work: TODAY: BNP If you have labs (blood work) drawn today and your tests are completely normal, you will receive your results only by: MyChart Message (if you have MyChart) OR A paper copy in the mail If you have any lab test that is abnormal or we need to change your treatment, we will call you to review the results.   Testing/Procedures: Your physician has requested that you wear a heart monitor.   Your physician has requested that you have an echocardiogram. Echocardiography is a painless test that uses sound waves to create images of your heart. It provides your doctor with information about the size and shape of your heart and how well your heart's chambers and valves are working. This procedure takes approximately one hour. There are no restrictions for this procedure.  Your physician has requested that you have an ankle brachial index (ABI). During this test an ultrasound and blood pressure cuff are used to evaluate the arteries that supply the arms and legs with blood. Allow thirty minutes for this exam. There are no restrictions or special instructions. Your physician has requested that you have a lower extremity arterial duplex. This test is an ultrasound of the arteries in the legs. It looks at arterial blood flow in the legs.  There are no restrictions or special instructions    Follow-Up: At [facility], you and your health needs are our priority.  As part of our continuing mission to provide you with exceptional heart care, we have created designated Provider Care Teams.  These Care Teams include your primary Cardiologist (physician) and Advanced Practice Providers (APPs -  Physician Assistants and Nurse Practitioners) who all work together to provide you with  the care you  need, when you need it.  We recommend signing up for the patient portal called "MyChart".  Sign up information is provided on this After Visit Summary.  MyChart is used to connect with patients for Virtual Visits (Telemedicine).  Patients are able to view lab/test results, encounter notes, upcoming appointments, etc.  Non-urgent messages can be sent to your provider as well.   To learn more about what you can do with MyChart, go to [email or web address].    Your next appointment:   3 -65month(s)  The format for your next appointment:   In Person  Provider:   Christell Constant, MD     Other Instructions  ZIO XT- Long Term Monitor Instructions  Your physician has requested you wear a ZIO patch monitor for 7 days.  This is a single patch monitor. [employer] supplies one patch monitor per enrollment. Additional stickers are not available. Please do not apply patch if you will be having a Nuclear Stress Test,  Echocardiogram, Cardiac CT, MRI, or Chest Xray during the period you would be wearing the  monitor. The patch cannot be worn during these tests. You cannot remove and re-apply the  ZIO XT patch monitor.  Your ZIO patch monitor will be mailed 3 day USPS to your address on file. It may take 3-5 days  to receive your monitor after you have been enrolled.  Once you have received your monitor, please review the enclosed instructions. Your monitor  has already been registered assigning a specific monitor serial # to you.  Billing and Patient Assistance Program Information  We have supplied [employer] with any of your insurance information on file for billing purposes. [employer] offers a sliding scale Patient Assistance Program for patients that do not have  insurance, or whose insurance does not completely cover the cost of the ZIO monitor.  You must apply for the Patient Assistance Program to qualify for this discounted rate.  To apply, please call [employer] at [phone], select option  4, select option 2, ask to apply for  Patient Assistance Program. Meredeth Ide will ask your household income, and how many people  are in your household. They will quote your out-of-pocket cost based on that information.  [employer] will also be able to set up a 66-month, interest-free payment plan if needed.  Applying the monitor   Shave hair from upper left chest.  Hold abrader disc by orange tab. Rub abrader in 40 strokes over the upper left chest as  indicated in your monitor instructions.  Clean area with 4 enclosed alcohol pads. Let dry.  Apply patch as indicated in monitor instructions. Patch will be placed under collarbone on left  side of chest with arrow pointing upward.  Rub patch adhesive wings for 2 minutes. Remove white label marked "1". Remove the white  label marked "2". Rub patch adhesive wings for 2 additional minutes.  While looking in a mirror, press and release button in center of patch. A small green light will  flash 3-4 times. This will be your only indicator that the monitor has been turned on.  Do not shower for the first 24 hours. You may shower after the first 24 hours.  Press the button if you feel a symptom. You will hear a small click. Record Date, Time and  Symptom in the Patient Logbook.  When you are ready to remove the patch, follow instructions on the last 2 pages of Patient  Logbook. Stick patch monitor onto the last page  of Patient Logbook.  Place Patient Logbook in the blue and white box. Use locking tab on box and tape box closed  securely. The blue and white box has prepaid postage on it. Please place it in the mailbox as  soon as possible. Your physician should have your test results approximately 7 days after the  monitor has been mailed back to [facility].  Call [facility] Customer Care at [phone] if you have questions regarding  your ZIO XT patch monitor. Call them immediately if you see an orange light blinking on your  monitor.  If  your monitor falls off in less than 4 days, contact our Monitor department at [phone].  If your monitor becomes loose or falls off after 4 days call [employer] at [phone] for  suggestions on securing your monitor.     Important Information About Sugar       Signed, Christell Constant, MD  06/12/2022 3:34 PM    [facility]

## 2022-06-12 ENCOUNTER — Encounter: Payer: Self-pay | Admitting: Internal Medicine

## 2022-06-12 ENCOUNTER — Other Ambulatory Visit: Payer: Self-pay | Admitting: Internal Medicine

## 2022-06-12 ENCOUNTER — Ambulatory Visit (INDEPENDENT_AMBULATORY_CARE_PROVIDER_SITE_OTHER): Payer: BLUE CROSS/BLUE SHIELD

## 2022-06-12 ENCOUNTER — Ambulatory Visit (INDEPENDENT_AMBULATORY_CARE_PROVIDER_SITE_OTHER): Payer: BLUE CROSS/BLUE SHIELD | Admitting: Internal Medicine

## 2022-06-12 ENCOUNTER — Telehealth: Payer: Self-pay | Admitting: Family Medicine

## 2022-06-12 VITALS — BP 98/64 | HR 96 | Ht 72.0 in | Wt 191.0 lb

## 2022-06-12 DIAGNOSIS — I471 Supraventricular tachycardia: Secondary | ICD-10-CM | POA: Diagnosis not present

## 2022-06-12 DIAGNOSIS — D472 Monoclonal gammopathy: Secondary | ICD-10-CM | POA: Diagnosis not present

## 2022-06-12 DIAGNOSIS — I493 Ventricular premature depolarization: Secondary | ICD-10-CM

## 2022-06-12 DIAGNOSIS — I491 Atrial premature depolarization: Secondary | ICD-10-CM

## 2022-06-12 DIAGNOSIS — R634 Abnormal weight loss: Secondary | ICD-10-CM

## 2022-06-12 DIAGNOSIS — I739 Peripheral vascular disease, unspecified: Secondary | ICD-10-CM | POA: Diagnosis not present

## 2022-06-12 DIAGNOSIS — R600 Localized edema: Secondary | ICD-10-CM

## 2022-06-12 DIAGNOSIS — I4719 Other supraventricular tachycardia: Secondary | ICD-10-CM

## 2022-06-12 DIAGNOSIS — R002 Palpitations: Secondary | ICD-10-CM

## 2022-06-12 NOTE — Telephone Encounter (Signed)
LMOM informed patient Testosterone has been denied. The labs were not taken before 10:00. He will need 2 early morning labs to resubmit the request or he can pay out of pocket using the Goodrx coupon.

## 2022-06-12 NOTE — Progress Notes (Unsigned)
Enrolled for [employer] to mail a ZIO XT long term holter monitor to the patients address on file.  

## 2022-06-12 NOTE — Patient Instructions (Signed)
Medication Instructions:  Your physician recommends that you continue on your current medications as directed. Please refer to the Current Medication list given to you today.  *If you need a refill on your cardiac medications before your next appointment, please call your pharmacy*   Lab Work: TODAY: BNP If you have labs (blood work) drawn today and your tests are completely normal, you will receive your results only by: [place] (if you have MyChart) OR A paper copy in the mail If you have any lab test that is abnormal or we need to change your treatment, we will call you to review the results.   Testing/Procedures: Your physician has requested that you wear a heart monitor.   Your physician has requested that you have an echocardiogram. Echocardiography is a painless test that uses sound waves to create images of your heart. It provides your doctor with information about the size and shape of your heart and how well your heart's chambers and valves are working. This procedure takes approximately one hour. There are no restrictions for this procedure.  Your physician has requested that you have an ankle brachial index (ABI). During this test an ultrasound and blood pressure cuff are used to evaluate the arteries that supply the arms and legs with blood. Allow thirty minutes for this exam. There are no restrictions or special instructions. Your physician has requested that you have a lower extremity arterial duplex. This test is an ultrasound of the arteries in the legs. It looks at arterial blood flow in the legs.  There are no restrictions or special instructions    Follow-Up: At [facility], you and your health needs are our priority.  As part of our continuing mission to provide you with exceptional heart care, we have created designated Provider Care Teams.  These Care Teams include your primary Cardiologist (physician) and Advanced Practice Providers (APPs -  Physician  Assistants and Nurse Practitioners) who all work together to provide you with the care you need, when you need it.  We recommend signing up for the patient portal called "MyChart".  Sign up information is provided on this After Visit Summary.  MyChart is used to connect with patients for Virtual Visits (Telemedicine).  Patients are able to view lab/test results, encounter notes, upcoming appointments, etc.  Non-urgent messages can be sent to your provider as well.   To learn more about what you can do with MyChart, go to [email or web address].    Your next appointment:   3 -31months)  The format for your next appointment:   In Person  Provider:   MWerner Lean MD     Other Instructions  ZIO XT- Long Term Monitor Instructions  Your physician has requested you wear a ZIO patch monitor for 7 days.  This is a single patch monitor. [employer] supplies one patch monitor per enrollment. Additional stickers are not available. Please do not apply patch if you will be having a Nuclear Stress Test,  Echocardiogram, Cardiac CT, MRI, or Chest Xray during the period you would be wearing the  monitor. The patch cannot be worn during these tests. You cannot remove and re-apply the  ZIO XT patch monitor.  Your ZIO patch monitor will be mailed 3 day USPS to your address on file. It may take 3-5 days  to receive your monitor after you have been enrolled.  Once you have received your monitor, please review the enclosed instructions. Your monitor  has already been registered assigning a specific monitor  serial # to you.  Billing and Patient Assistance Program Information  We have supplied [employer] with any of your insurance information on file for billing purposes. [employer] offers a sliding scale Patient Assistance Program for patients that do not have  insurance, or whose insurance does not completely cover the cost of the ZIO monitor.  You must apply for the Patient Assistance Program to  qualify for this discounted rate.  To apply, please call [employer] at [phone], select option 4, select option 2, ask to apply for  Patient Assistance Program. Theodore Demark will ask your household income, and how many people  are in your household. They will quote your out-of-pocket cost based on that information.  [employer] will also be able to set up a 69-month interest-free payment plan if needed.  Applying the monitor   Shave hair from upper left chest.  Hold abrader disc by orange tab. Rub abrader in 40 strokes over the upper left chest as  indicated in your monitor instructions.  Clean area with 4 enclosed alcohol pads. Let dry.  Apply patch as indicated in monitor instructions. Patch will be placed under collarbone on left  side of chest with arrow pointing upward.  Rub patch adhesive wings for 2 minutes. Remove white label marked "1". Remove the white  label marked "2". Rub patch adhesive wings for 2 additional minutes.  While looking in a mirror, press and release button in center of patch. A small green light will  flash 3-4 times. This will be your only indicator that the monitor has been turned on.  Do not shower for the first 24 hours. You may shower after the first 24 hours.  Press the button if you feel a symptom. You will hear a small click. Record Date, Time and  Symptom in the Patient Logbook.  When you are ready to remove the patch, follow instructions on the last 2 pages of Patient  Logbook. Stick patch monitor onto the last page of Patient Logbook.  Place Patient Logbook in the blue and white box. Use locking tab on box and tape box closed  securely. The blue and white box has prepaid postage on it. Please place it in the mailbox as  soon as possible. Your physician should have your test results approximately 7 days after the  [facility]  Call [facility] [place]at [phone] you have questions regarding  your ZIO XT  patch monitor. Call them immediately if you see an orange light blinking on your  monitor.  If your monitor falls off in less than 4 days, contact our Monitor department at [phone]  If your monitor becomes loose or falls off after 4 days call [employer] at [phone]  suggestions on securing your monitor.     Important Information About Sugar

## 2022-06-13 LAB — CBC
Hematocrit: 37.1 % — ABNORMAL LOW (ref 37.5–51.0)
Hemoglobin: 12.2 g/dL — ABNORMAL LOW (ref 13.0–17.7)
MCH: 28.2 pg (ref 26.6–33.0)
MCHC: 32.9 g/dL (ref 31.5–35.7)
MCV: 86 fL (ref 79–97)
Platelets: 337 10*3/uL (ref 150–450)
RBC: 4.33 x10E6/uL (ref 4.14–5.80)
RDW: 13.1 % (ref 11.6–15.4)
WBC: 12.2 10*3/uL — ABNORMAL HIGH (ref 3.4–10.8)

## 2022-06-13 LAB — PRO B NATRIURETIC PEPTIDE: NT-Pro BNP: 84 pg/mL (ref 0–210)

## 2022-06-15 DIAGNOSIS — I471 Supraventricular tachycardia: Secondary | ICD-10-CM

## 2022-06-15 DIAGNOSIS — I493 Ventricular premature depolarization: Secondary | ICD-10-CM | POA: Diagnosis not present

## 2022-06-15 DIAGNOSIS — R002 Palpitations: Secondary | ICD-10-CM

## 2022-06-27 DIAGNOSIS — I739 Peripheral vascular disease, unspecified: Secondary | ICD-10-CM | POA: Diagnosis not present

## 2022-06-27 DIAGNOSIS — I471 Supraventricular tachycardia: Secondary | ICD-10-CM | POA: Diagnosis not present

## 2022-07-03 ENCOUNTER — Other Ambulatory Visit: Payer: Self-pay | Admitting: Urology

## 2022-07-04 ENCOUNTER — Ambulatory Visit (HOSPITAL_BASED_OUTPATIENT_CLINIC_OR_DEPARTMENT_OTHER): Payer: BLUE CROSS/BLUE SHIELD

## 2022-07-04 ENCOUNTER — Ambulatory Visit (HOSPITAL_COMMUNITY)
Admission: RE | Admit: 2022-07-04 | Discharge: 2022-07-04 | Disposition: A | Discharge status: Home / Self Care | Payer: BLUE CROSS/BLUE SHIELD | Source: Ambulatory Visit | Attending: Cardiovascular Disease | Admitting: Cardiovascular Disease

## 2022-07-04 DIAGNOSIS — R600 Localized edema: Secondary | ICD-10-CM

## 2022-07-04 DIAGNOSIS — R634 Abnormal weight loss: Secondary | ICD-10-CM | POA: Insufficient documentation

## 2022-07-04 DIAGNOSIS — I739 Peripheral vascular disease, unspecified: Secondary | ICD-10-CM | POA: Diagnosis not present

## 2022-07-04 DIAGNOSIS — I471 Supraventricular tachycardia: Secondary | ICD-10-CM

## 2022-07-04 DIAGNOSIS — D472 Monoclonal gammopathy: Secondary | ICD-10-CM | POA: Insufficient documentation

## 2022-07-04 LAB — ECHOCARDIOGRAM COMPLETE
Area-P 1/2: 3.75 cm2
S' Lateral: 3.3 cm

## 2022-07-17 ENCOUNTER — Other Ambulatory Visit: Payer: Self-pay | Admitting: Urology

## 2022-07-30 ENCOUNTER — Other Ambulatory Visit: Payer: Self-pay | Admitting: Urology

## 2022-08-02 ENCOUNTER — Other Ambulatory Visit: Payer: Self-pay | Admitting: Urology

## 2022-08-28 ENCOUNTER — Other Ambulatory Visit: Payer: Self-pay | Admitting: Urology

## 2022-09-15 NOTE — Progress Notes (Deleted)
Cardiology Office Note:    Date:  09/15/2022   ID:  Shawn [name], DOB 1961/01/09, MRN [id number]  PCP:  Lois Huxley, PA   [facility] Providers Cardiologist:  Werner Lean, MD     Referring MD: Lois Huxley, PA   CC: follow up Echo  History of Present Illness:    Shawn [name] is a 61 y.o. male with a hx of AT NOS, MGUS HTN, who presents 06/12/22. 2023: After visit had echo- small echodensity seen in [place]; planned for CMR and PYP scan.  We called 3-4 times; he had a VM that was full.  We have been unable to contact him or his family.He was able to see his MyChart Results for his PAD study that was negative.Had negative ziopatch.  Patient notes that he is doing ***.   Since last visit notes *** . There are no*** interval hospital/ED visit.    No chest pain or pressure ***.  No SOB/DOE*** and no PND/Orthopnea***.  No weight gain or leg swelling***.  No palpitations or syncope ***.  Ambulatory blood pressure ***.     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-followed by Dr Arnoldo Lenis   Multiple thyroid nodules    Onychomycosis    treated with lamisil in the past   Other chronic pain    Pain in right hand    Palpitations    Prediabetes    Pruritus    Retinal detachment    right eye caused blindness   Tinea cruris    Tinnitus    Total retinal detachment of right eye     Past Surgical History:  Procedure Laterality Date    APPENDECTOMY     CYSTOSCOPY W/ URETERAL STENT PLACEMENT  08/24/2012   Procedure: CYSTOSCOPY WITH RETROGRADE PYELOGRAM/URETERAL STENT PLACEMENT;  Surgeon: Ailene Rud, MD;  Location: [facility];  Service: Urology;  Laterality: Right;   CYSTOSCOPY/RETROGRADE/URETEROSCOPY  08/24/2012   Procedure: CYSTOSCOPY/RETROGRADE/URETEROSCOPY;  Surgeon: Ailene Rud, MD;  Location: [facility];  Service: Urology;  Laterality: Right;  Stone Basketing   CYSTOSCOPY/RETROGRADE/URETEROSCOPY/STONE EXTRACTION WITH BASKET Left 04/11/2013   Procedure: CYSTOSCOPY/RETROGRADE/URETEROSCOPY/STONE EXTRACTION WITH BASKET;  Surgeon: Ailene Rud, MD;  [facility];  Service: Urology;  Laterality: Left;  BASKET STONE EXTRACTION WITH URETEROSCOPE     Right retina detachment      Current Medications: No outpatient medications have been marked as taking for the 09/16/22 encounter (Appointment) with Werner Lean, MD.     Allergies:   Patient has no known allergies.   Social History   Socioeconomic History   Marital status: Married    Spouse name: Not on file   Number of children: Not on file   Years of education: Not on file   Highest education level: Not on file  Occupational History  Not on file  Tobacco Use   Smoking status: Former    Types: Pipe   Smokeless tobacco: Never  Substance and Sexual Activity   Alcohol use: Yes    Comment: ocassionally beer or wine   Drug use: No   Sexual activity: Never  Other Topics Concern   Not on file  Social History Narrative   Not on file   Social Determinants of Health   Financial Resource Strain: Not on file  Food Insecurity: Not on file  Transportation Needs: Not on file  Physical Activity: Not on file  Stress: Not on file  Social Connections: Not on file    Social: Church friends of Gena Fray  Family History: The patient's family history includes Aneurysm in his mother and paternal grandmother; Bladder  Cancer in his brother; CAD in his father; Diabetes in his mother; Heart attack in his father and paternal grandfather; Heart disease in his father; Hypertension in his father; Stroke in his mother. There is no history of Thyroid disease.  ROS:   Please see the history of present illness.     All other systems reviewed and are negative.  EKGs/Labs/Other Studies Reviewed:    The following studies were reviewed today:  EKG:   06/12/22: SR 90   ECHO COMPLETE WO IMAGING ENHANCING AGENT 07/04/2022  Narrative ECHOCARDIOGRAM REPORT    Patient Name:   Shawn [name] Date of Exam: 07/04/2022 Medical Rec #:  [id number]     Height:       72.0 in Accession #:    [id number]    Weight:       191.0 lb Date of Birth:  1961/06/19    BSA:          2.089 m Patient Age:    57 years      BP:           98/64 mmHg Patient Gender: M             HR:           84 bpm. Exam Location:  [place]  Procedure: 2D Echo, 3D Echo, Cardiac Doppler, Color Doppler and Strain Analysis  Indications:    r60.0 Lower extremity edema  History:        Patient has no prior history of Echocardiogram examinations. Arrythmias:Palpitations, Signs/Symptoms:Chest Pain and Dyspnea; Risk Factors:Hypertension and Pre-diabetes.  Sonographer:    Cresenciano Lick RDCS Referring Phys: [id number] [facility] A Wilbert Schouten  IMPRESSIONS   1. Left ventricular ejection fraction, by estimation, is 55 to 60%. Left ventricular ejection fraction by 3D volume is 56 %. The left ventricle has normal function. The left ventricle has no regional wall motion abnormalities. Left ventricular diastolic parameters were normal. 2. Right ventricular systolic function is normal. The right ventricular size is normal. Tricuspid regurgitation signal is inadequate for assessing PA pressure. 3. The mitral valve is normal in structure. Trivial mitral valve regurgitation. No evidence of mitral stenosis. 4. The aortic valve is tricuspid. Aortic valve  regurgitation is not visualized. 5. Small Echodensity seen in left atrium in imaging series 58 and 64 not seen in other series.  Conclusion(s)/Recommendation(s): Consider CMR if clinically indicated.  FINDINGS Left Ventricle: Left ventricular ejection fraction, by estimation, is 55 to 60%. Left ventricular ejection fraction by 3D volume is 56 %. The left ventricle has normal function. The left ventricle has no regional wall motion abnormalities. The left ventricular internal cavity size was normal in size. There is no left ventricular hypertrophy. Left ventricular  diastolic parameters were normal.  Right Ventricle: The right ventricular size is normal. No increase in right ventricular wall thickness. Right ventricular systolic function is normal. Tricuspid regurgitation signal is inadequate for assessing PA pressure.  Left Atrium: Left atrial size was normal in size.  Right Atrium: Right atrial size was normal in size.  Pericardium: There is no evidence of pericardial effusion.  Mitral Valve: The mitral valve is normal in structure. Trivial mitral valve regurgitation. No evidence of mitral valve stenosis.  Tricuspid Valve: The tricuspid valve is normal in structure. Tricuspid valve regurgitation is trivial. No evidence of tricuspid stenosis.  Aortic Valve: The aortic valve is tricuspid. Aortic valve regurgitation is not visualized.  Pulmonic Valve: The pulmonic valve was normal in structure. Pulmonic valve regurgitation is mild. No evidence of pulmonic stenosis.  Aorta: The aortic root and ascending aorta are structurally normal, with no evidence of dilitation.  IAS/Shunts: The atrial septum is grossly normal.   LEFT VENTRICLE PLAX 2D LVIDd:         4.90 cm         Diastology LVIDs:         3.30 cm         LV e' medial:    7.72 cm/s LV PW:         0.90 cm         LV E/e' medial:  14.8 LV IVS:        0.90 cm         LV e' lateral:   13.10 cm/s LVOT diam:     2.00 cm         LV E/e'  lateral: 8.7 LV SV:         67 LV SV Index:   32              2D LVOT Area:     3.14 cm        Longitudinal Strain 2D Strain GLS  -23.5 % (A2C): 2D Strain GLS  -22.2 % (A3C): 2D Strain GLS  -23.7 % (A4C): 2D Strain GLS  -23.2 % Avg:  3D Volume EF LV 3D EF:    Left ventricul ar ejection fraction by 3D volume is 56 %.  3D Volume EF: 3D EF:        56 % LV EDV:       168 ml LV ESV:       74 ml LV SV:        94 ml  RIGHT VENTRICLE             IVC RV Basal diam:  3.90 cm     IVC diam: 2.00 cm RV S prime:     14.70 cm/s TAPSE (M-mode): 2.0 cm  LEFT ATRIUM             Index        RIGHT ATRIUM           Index LA diam:        4.60 cm 2.20 cm/m   RA Area:     11.60 cm LA Vol (A2C):   51.9 ml 24.84 ml/m  RA Volume:   25.90 ml  12.40 ml/m LA Vol (A4C):   48.2 ml 23.07 ml/m LA Biplane Vol: 50.1 ml 23.98 ml/m AORTIC VALVE LVOT Vmax:   108.67 cm/s LVOT Vmean:  70.633 cm/s LVOT VTI:    0.213 m  AORTA Ao Root diam: 3.50 cm Ao Asc diam:  3.90 cm  MITRAL VALVE MV Area (PHT): 3.75 cm     SHUNTS MV Decel Time: 203 msec     Systemic VTI:  0.21 m MV E velocity: 114.50 cm/s  Systemic Diam: 2.00 cm MV A velocity: 78.60 cm/s MV E/A ratio:  1.46  Rudean Haskell MD Electronically signed by Rudean Haskell MD Signature Date/Time: 07/04/2022/11:31:50 AM    Final   LONG TERM MONITOR (3-7 DAYS) INTERPRETATION 06/27/2022  Narrative   Patient had a minimum heart rate of 55 bpm, maximum heart rate of 146 bpm, and average heart rate of 82 bpm.   Predominant underlying rhythm was sinus rhythm.   Five short runs of Paroxsymal SVT lasting 10 beats at longest with a max rate of 146 bpm at fastest.   Isolated PACs were occasional (1.3%).   Isolated PVCs were rare (<1.0%).   Triggered and diary events associated with sinus rhythm.  No malignant arrhythmias.     Recent Labs: 01/27/2022: ALT 13; BUN 16; Creatinine, Ser 0.89; Potassium 4.7; Sodium 137; TSH  1.270 06/12/2022: Hemoglobin 12.2; NT-Pro BNP 84; Platelets 337  Recent Lipid Panel No results found for: "CHOL", "TRIG", "HDL", "CHOLHDL", "VLDL", "LDLCALC", "LDLDIRECT"      Physical Exam:    VS:  There were no vitals taken for this visit.    Wt Readings from Last 3 Encounters:  06/12/22 191 lb (86.6 kg)  06/04/22 192 lb (87.1 kg)  02/07/22 194 lb 12.8 oz (88.4 kg)    Gen: no distress   Neck: No JVD Cardiac: No Rubs or Gallops, no Murmur, RRR cardia, +2 radial pulses Respiratory: Clear to auscultation bilaterally, normal effort, normal  respiratory rate GI: Soft, nontender, non-distended  MS: +1 bilateral edema;  moves all extremities Integument: Skin feels warm Neuro:  At time of evaluation, alert and oriented to person/place/time/situation  Psych: Normal affect, patient feels ok   ASSESSMENT:    No diagnosis found.  PLAN:     MGUS Weight loss DOE LE Edema R/u LA mass - I am concerns about amyloidosis, will get CMR, may get PCP scan  Hx of Atrial Tachycardia  AT and palpitations  Three months me or APP       Medication Adjustments/Labs and Tests Ordered: Current medicines are reviewed at length with the patient today.  Concerns regarding medicines are outlined above.  No orders of the defined types were placed in this encounter.  No orders of the defined types were placed in this encounter.   There are no Patient Instructions on file for this visit.   Signed, Werner Lean, MD  09/15/2022 10:12 AM    [place]

## 2022-09-16 ENCOUNTER — Ambulatory Visit: Payer: BLUE CROSS/BLUE SHIELD | Attending: Internal Medicine | Admitting: Internal Medicine

## 2022-09-17 ENCOUNTER — Telehealth: Payer: Self-pay | Admitting: Internal Medicine

## 2022-09-17 NOTE — Telephone Encounter (Signed)
Called about missed appts and missed echo results voicemails (his VM was full).  Left a VM.  I suspect  artifact on echo, but I would like to get a CMR to rule out cardiac amyloidosis.  Wanted to make sure he is well and that we are happy to see him in follow up if we would wish.  If patient calls back, can give echo results (we have missed him since then) offer CMR and offer follow up visit.  Rudean Haskell, MD [address], [address] [place], [place] [address] [phone]  6:12 PM

## 2022-09-29 ENCOUNTER — Other Ambulatory Visit: Payer: Self-pay | Admitting: Urology

## 2022-10-15 DIAGNOSIS — Z6826 Body mass index (BMI) 26.0-26.9, adult: Secondary | ICD-10-CM | POA: Diagnosis not present

## 2022-10-15 DIAGNOSIS — M48062 Spinal stenosis, lumbar region with neurogenic claudication: Secondary | ICD-10-CM | POA: Diagnosis not present

## 2022-10-24 ENCOUNTER — Encounter: Payer: Self-pay | Admitting: Internal Medicine

## 2022-10-24 ENCOUNTER — Ambulatory Visit: Payer: BLUE CROSS/BLUE SHIELD | Attending: Internal Medicine | Admitting: Internal Medicine

## 2022-10-24 VITALS — BP 102/66 | HR 83 | Ht 72.0 in | Wt 199.0 lb

## 2022-10-24 DIAGNOSIS — I5189 Other ill-defined heart diseases: Secondary | ICD-10-CM

## 2022-10-24 DIAGNOSIS — I4719 Other supraventricular tachycardia: Secondary | ICD-10-CM

## 2022-10-24 DIAGNOSIS — I10 Essential (primary) hypertension: Secondary | ICD-10-CM | POA: Diagnosis not present

## 2022-10-24 LAB — CBC
Hematocrit: 37.4 % — ABNORMAL LOW (ref 37.5–51.0)
Hemoglobin: 12.6 g/dL — ABNORMAL LOW (ref 13.0–17.7)
MCH: 29.4 pg (ref 26.6–33.0)
MCHC: 33.7 g/dL (ref 31.5–35.7)
MCV: 87 fL (ref 79–97)
Platelets: 297 10*3/uL (ref 150–450)
RBC: 4.28 x10E6/uL (ref 4.14–5.80)
RDW: 14.6 % (ref 11.6–15.4)
WBC: 10.1 10*3/uL (ref 3.4–10.8)

## 2022-10-24 MED ORDER — METOPROLOL SUCCINATE ER 50 MG PO TB24: 50.0000 mg | ORAL_TABLET | Freq: Every day | ORAL | 3 refills | Status: AC

## 2022-10-24 MED ORDER — LISINOPRIL 20 MG PO TABS
20.0000 mg | ORAL_TABLET | Freq: Every morning | ORAL | 3 refills | Status: DC
Start: 2022-10-24 — End: 2023-08-31

## 2022-10-24 NOTE — Patient Instructions (Signed)
Medication Instructions:  Your physician recommends that you continue on your current medications as directed. Please refer to the Current Medication list given to you today. REFILLED: metoprolol and lisinopril   *If you need a refill on your cardiac medications before your next appointment, please call your pharmacy*   Lab Work: TODAY: CBC  If you have labs (blood work) drawn today and your tests are completely normal, you will receive your results only by: [place] (if you have MyChart) OR A paper copy in the mail If you have any lab test that is abnormal or we need to change your treatment, we will call you to review the results.   Testing/Procedures: Your physician has requested that you have a cardiac MRI. Cardiac MRI uses a computer to create images of your heart as its beating, producing both still and moving pictures of your heart and major blood vessels. For further information please visit [email or web address] Please follow the instruction sheet given to you today for more information.    Follow-Up: At [facility], you and your health needs are our priority.  As part of our continuing mission to provide you with exceptional heart care, we have created designated Provider Care Teams.  These Care Teams include your primary Cardiologist (physician) and Advanced Practice Providers (APPs -  Physician Assistants and Nurse Practitioners) who all work together to provide you with the care you need, when you need it.  We recommend signing up for the patient portal called "MyChart".  Sign up information is provided on this After Visit Summary.  MyChart is used to connect with patients for Virtual Visits (Telemedicine).  Patients are able to view lab/test results, encounter notes, upcoming appointments, etc.  Non-urgent messages can be sent to your provider as well.   To learn more about what you can do with MyChart, go to [email or web address].    Your next appointment:    6 month(s)  The format for your next appointment:   In Person  Provider:   Werner Lean, MD     Other Instructions   You are scheduled for Cardiac MRI on ______________. Please arrive for your appointment at ______________ ( arrive 30-45 minutes prior to test start time). ?  [facility] [address] [place], [place] [address] [phone] Please take advantage of the free valet parking available at the MAIN entrance (A entrance).  Proceed to the [facility] Radiology Department (First Floor) for check-in.   [facility] [place], [place] [address] [phone] Please take advantage of the free valet parking available at the MAIN entrance. Proceed to [facility] registration for check-in (first floor).  Magnetic resonance imaging (MRI) is a painless test that produces images of the inside of the body without using Xrays.  During an MRI, strong magnets and radio waves work together in a Research officer, political party to form detailed images.   MRI images may provide more details about a medical condition than X-rays, CT scans, and ultrasounds can provide.  You may be given earphones to listen for instructions.  You may eat a light breakfast and take medications as ordered with the exception of HCTZ (fluid pill, other). Please avoid stimulants for 12 hr prior to test. (Ie. Caffeine, nicotine, chocolate, or antihistamine medications)  If a contrast material will be used, an IV will be inserted into one of your veins. Contrast material will be injected into your IV. It will leave your body through your  urine within a day. You may be told to drink plenty of fluids to help flush the contrast material out of your system.  You will be asked to remove all metal, including: Watch, jewelry, and other metal objects including hearing aids, hair pieces and dentures. Also wearable glucose monitoring systems (ie. Freestyle Libre and Omnipods)  (Braces and fillings normally are not a problem.)   TEST WILL TAKE APPROXIMATELY 1 HOUR  PLEASE NOTIFY SCHEDULING AT LEAST 24 HOURS IN ADVANCE IF YOU ARE UNABLE TO KEEP YOUR APPOINTMENT. [phone]  Please call Marchia Bond, cardiac imaging nurse navigator with any questions/concerns. Marchia Bond RN Navigator Cardiac Imaging Gordy Clement RN Navigator Cardiac Imaging [facility] [phone] Office    Important Information About Sugar

## 2022-10-24 NOTE — Addendum Note (Signed)
Addended by: Precious Gilding on: 10/24/2022 11:19 AM   Modules accepted: Orders

## 2022-10-24 NOTE — Progress Notes (Signed)
Cardiology Office Note:    Date:  10/24/2022   ID:  Shawn [name], DOB [birth year], MRN [id number]  PCP:  Shawn Huxley, PA   [name] Providers Cardiologist:  Shawn Lean, MD     Referring MD: Shawn Huxley, PA   CC: Lost to follow up  History of Present Illness:    Shawn [name] is a 61 y.o. male with a hx of AT NOS, MGUS HTN, who presents 06/12/22. 2023: he lost weight.  Could not rule out left atrial mass.  Planned for CMR then lost to follow up  Patient notes that he is doing well.   There are no interval hospital/ED visit.    No chest pain or pressure .  No SOB/DOE and no PND/Orthopnea.  No weight gain or leg swelling.  No palpitations or syncope . Still having leg pain. He has added Shawn [name] to his HIPAA list to make sure she is able to help with him cardiac care.  He is friends with Shawn [name], a patient who I saw briefly who died from AL Amyloidosis.  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-followed by Dr Arnoldo Lenis   Multiple thyroid nodules    Onychomycosis    treated with lamisil in the past   Other chronic pain    Pain in right hand    Palpitations    Prediabetes    Pruritus    Retinal detachment    right eye caused blindness   Tinea cruris    Tinnitus    Total retinal detachment of right eye     Past Surgical History:  Procedure Laterality Date   APPENDECTOMY     CYSTOSCOPY W/ URETERAL STENT PLACEMENT  08/24/2012    Procedure: CYSTOSCOPY WITH RETROGRADE PYELOGRAM/URETERAL STENT PLACEMENT;  Surgeon: Ailene Rud, MD;  Location: [facility];  Service: Urology;  Laterality: Right;   CYSTOSCOPY/RETROGRADE/URETEROSCOPY  08/24/2012   Procedure: CYSTOSCOPY/RETROGRADE/URETEROSCOPY;  Surgeon: Ailene Rud, MD;  Location: [facility];  Service: Urology;  Laterality: Right;  Stone Basketing   CYSTOSCOPY/RETROGRADE/URETEROSCOPY/STONE EXTRACTION WITH BASKET Left 04/11/2013   Procedure: CYSTOSCOPY/RETROGRADE/URETEROSCOPY/STONE EXTRACTION WITH BASKET;  Surgeon: Ailene Rud, MD;  Location: [facility];  Service: Urology;  Laterality: Left;  BASKET STONE EXTRACTION WITH URETEROSCOPE     Right retina detachment      Current Medications: Current Meds  Medication Sig   celecoxib (CELEBREX) 200 MG capsule Take 200 mg by mouth 2 (two) times daily.   gabapentin (NEURONTIN) 300 MG capsule Take by mouth. Take 1 tab in the morning and 2 tabs in the evening   NEEDLE, DISP, 18 G 18G X 1" MISC Use as directed   SYRINGE-NEEDLE, DISP, 3 ML (B-D 3CC LUER-LOK SYR 21GX1-1/2) 21G X 1-1/2" 3 ML MISC USE AS DIRECTED FOR TESTOSTERONE ADMINISTRATION   tadalafil (CIALIS) 5 MG  tablet TAKE 1 TABLET BY MOUTH DAILY AS NEEDED FOR ERECTILE DYSFUNCTION.   testosterone cypionate (DEPOTESTOSTERONE CYPIONATE) 200 MG/ML injection INJECT 1 ML (200 MG TOTAL) INTO THE MUSCLE EVERY 14 DAYS   [DISCONTINUED] lisinopril (ZESTRIL) 20 MG tablet Take 1 tablet by mouth every morning.   [DISCONTINUED] metoprolol succinate (TOPROL-XL) 50 MG 24 hr tablet Take 50 mg by mouth daily.    Allergies:   Patient has no known allergies.   Social History   Socioeconomic History   Marital status: Married    Spouse name: Not on file   Number of children: Not on file   Years of education: Not on file   Highest education level: Not on file  Occupational History   Not on file  Tobacco Use   Smoking status: Former    Types: Pipe   Smokeless  tobacco: Never  Substance and Sexual Activity   Alcohol use: Yes    Comment: ocassionally beer or wine   Drug use: No   Sexual activity: Never  Other Topics Concern   Not on file  Social History Narrative   Not on file   Social Determinants of Health   Financial Resource Strain: Not on file  Food Insecurity: Not on file  Transportation Needs: Not on file  Physical Activity: Not on file  Stress: Not on file  Social Connections: Not on file    Social: [place] friends of Shawn [name], new Shawn [name], he goes to [employer]   Family History: The patient's family history includes Aneurysm in his mother and paternal grandmother; Bladder Cancer in his brother; CAD in his father; Diabetes in his mother; Heart attack in his father and paternal grandfather; Heart disease in his father; Hypertension in his father; Stroke in his mother. There is no history of Thyroid disease.  ROS:   Please see the history of present illness.     All other systems reviewed and are negative.  EKGs/Labs/Other Studies Reviewed:    The following studies were reviewed today:  EKG:   06/12/22: SR 90   Cardiac Studies & Procedures       ECHOCARDIOGRAM  ECHOCARDIOGRAM COMPLETE 07/04/2022  Narrative ECHOCARDIOGRAM REPORT    Patient Name:   Shawn [name] Date of Exam: 07/04/2022 Medical Rec #:  [id number]     Height:       72.0 in Accession #:    [id number]    Weight:       191.0 lb Date of Birth:  20-Jun-1961    BSA:          2.089 m Patient Age:    58 years      BP:           98/64 mmHg Patient Gender: M             HR:           84 bpm. Exam Location:  [place]  Procedure: 2D Echo, 3D Echo, Cardiac Doppler, Color Doppler and Strain Analysis  Indications:    r60.0 Lower extremity edema  History:        Patient has no prior history of Echocardiogram examinations. Arrythmias:Palpitations, Signs/Symptoms:Chest Pain and Dyspnea; Risk Factors:Hypertension and  Pre-diabetes.  Sonographer:    Cresenciano Lick RDCS Referring Phys: [id number] [facility] A Paul Trettin  IMPRESSIONS   1. Left ventricular ejection fraction, by estimation, is 55 to 60%. Left ventricular ejection fraction by 3D volume is 56 %. The left ventricle has normal function. The left ventricle has no  regional wall motion abnormalities. Left ventricular diastolic parameters were normal. 2. Right ventricular systolic function is normal. The right ventricular size is normal. Tricuspid regurgitation signal is inadequate for assessing PA pressure. 3. The mitral valve is normal in structure. Trivial mitral valve regurgitation. No evidence of mitral stenosis. 4. The aortic valve is tricuspid. Aortic valve regurgitation is not visualized. 5. Small Echodensity seen in left atrium in imaging series 58 and 64 not seen in other series.  Conclusion(s)/Recommendation(s): Consider CMR if clinically indicated.  FINDINGS Left Ventricle: Left ventricular ejection fraction, by estimation, is 55 to 60%. Left ventricular ejection fraction by 3D volume is 56 %. The left ventricle has normal function. The left ventricle has no regional wall motion abnormalities. The left ventricular internal cavity size was normal in size. There is no left ventricular hypertrophy. Left ventricular diastolic parameters were normal.  Right Ventricle: The right ventricular size is normal. No increase in right ventricular wall thickness. Right ventricular systolic function is normal. Tricuspid regurgitation signal is inadequate for assessing PA pressure.  Left Atrium: Left atrial size was normal in size.  Right Atrium: Right atrial size was normal in size.  Pericardium: There is no evidence of pericardial effusion.  Mitral Valve: The mitral valve is normal in structure. Trivial mitral valve regurgitation. No evidence of mitral valve stenosis.  Tricuspid Valve: The tricuspid valve is normal in structure. Tricuspid valve  regurgitation is trivial. No evidence of tricuspid stenosis.  Aortic Valve: The aortic valve is tricuspid. Aortic valve regurgitation is not visualized.  Pulmonic Valve: The pulmonic valve was normal in structure. Pulmonic valve regurgitation is mild. No evidence of pulmonic stenosis.  Aorta: The aortic root and ascending aorta are structurally normal, with no evidence of dilitation.  IAS/Shunts: The atrial septum is grossly normal.   LEFT VENTRICLE PLAX 2D LVIDd:         4.90 cm         Diastology LVIDs:         3.30 cm         LV e' medial:    7.72 cm/s LV PW:         0.90 cm         LV E/e' medial:  14.8 LV IVS:        0.90 cm         LV e' lateral:   13.10 cm/s LVOT diam:     2.00 cm         LV E/e' lateral: 8.7 LV SV:         67 LV SV Index:   32              2D LVOT Area:     3.14 cm        Longitudinal Strain 2D Strain GLS  -23.5 % (A2C): 2D Strain GLS  -22.2 % (A3C): 2D Strain GLS  -23.7 % (A4C): 2D Strain GLS  -23.2 % Avg:  3D Volume EF LV 3D EF:    Left ventricul ar ejection fraction by 3D volume is 56 %.  3D Volume EF: 3D EF:        56 % LV EDV:       168 ml LV ESV:       74 ml LV SV:        94 ml  RIGHT VENTRICLE             IVC RV Basal diam:  3.90 cm     IVC  diam: 2.00 cm RV S prime:     14.70 cm/s TAPSE (M-mode): 2.0 cm  LEFT ATRIUM             Index        RIGHT ATRIUM           Index LA diam:        4.60 cm 2.20 cm/m   RA Area:     11.60 cm LA Vol (A2C):   51.9 ml 24.84 ml/m  RA Volume:   25.90 ml  12.40 ml/m LA Vol (A4C):   48.2 ml 23.07 ml/m LA Biplane Vol: 50.1 ml 23.98 ml/m AORTIC VALVE LVOT Vmax:   108.67 cm/s LVOT Vmean:  70.633 cm/s LVOT VTI:    0.213 m  AORTA Ao Root diam: 3.50 cm Ao Asc diam:  3.90 cm  MITRAL VALVE MV Area (PHT): 3.75 cm     SHUNTS MV Decel Time: 203 msec     Systemic VTI:  0.21 m MV E velocity: 114.50 cm/s  Systemic Diam: 2.00 cm MV A velocity: 78.60 cm/s MV E/A ratio:  1.46  Rudean Haskell MD Electronically signed by Rudean Haskell MD Signature Date/Time: 07/04/2022/11:31:50 AM    Final    MONITORS  LONG TERM MONITOR (3-14 DAYS) 07/02/2022  Narrative   Patient had a minimum heart rate of 55 bpm, maximum heart rate of 146 bpm, and average heart rate of 82 bpm.   Predominant underlying rhythm was sinus rhythm.   Five short runs of Paroxsymal SVT lasting 10 beats at longest with a max rate of 146 bpm at fastest.   Isolated PACs were occasional (1.3%).   Isolated PVCs were rare (<1.0%).   Triggered and diary events associated with sinus rhythm.  No malignant arrhythmias.            Recent Labs: 01/27/2022: ALT 13; BUN 16; Creatinine, Ser 0.89; Potassium 4.7; Sodium 137; TSH 1.270 06/12/2022: Hemoglobin 12.2; NT-Pro BNP 84; Platelets 337  Recent Lipid Panel No results found for: "CHOL", "TRIG", "HDL", "CHOLHDL", "VLDL", "LDLCALC", "LDLDIRECT"      Physical Exam:    VS:  BP 102/66   Pulse 83   Ht 6' (1.829 m)   Wt 199 lb (90.3 kg)   SpO2 98%   BMI 26.99 kg/m     Wt Readings from Last 3 Encounters:  10/24/22 199 lb (90.3 kg)  06/12/22 191 lb (86.6 kg)  06/04/22 192 lb (87.1 kg)    Gen: no distress   Neck: No JVD Cardiac: No Rubs or Gallops, no Murmur, RRR, +2 radial pulses Respiratory: Clear to auscultation bilaterally, normal effort, normal  respiratory rate GI: Soft, nontender, non-distended  MS: non pitting edema;  moves all extremities Integument: Skin feels warm Neuro:  At time of evaluation, alert and oriented to person/place/time/situation  Psych: Normal affect, patient feels ok  ASSESSMENT:    1. Left atrial mass   2. Atrial tachycardia   3. 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  Concerns regarding medicines are outlined above.  Orders Placed This Encounter  Procedures   MR CARDIAC MORPHOLOGY W WO CONTRAST   CBC   Meds ordered this encounter  Medications   lisinopril (ZESTRIL) 20 MG tablet    Sig: Take 1 tablet (20 mg total) by mouth every morning.    Dispense:  90 tablet    Refill:  3   metoprolol succinate (TOPROL-XL) 50 MG 24 hr tablet    Sig: Take 1 tablet (50 mg total) by mouth daily.    Dispense:  90 tablet    Refill:  3    Patient Instructions  Medication Instructions:  Your physician recommends that you continue on your current medications as directed. Please refer to the Current Medication list given to you today. REFILLED: metoprolol and lisinopril   *If you need a refill on your cardiac medications before your next appointment, please call your pharmacy*   Lab Work: TODAY: CBC  If you have labs (blood work) drawn today and your tests are completely normal, you will receive your results only by: [place] (if you have MyChart) OR A paper copy in the mail If you have any lab test that is abnormal or we need to change your treatment, we will call you to review the results.   Testing/Procedures: Your physician has requested that you have a cardiac MRI. Cardiac MRI uses a computer to create images of your heart as its beating, producing both still and moving pictures of your heart and major blood vessels. For further information please visit [email or web address] Please follow the instruction sheet given to you today for more information.    Follow-Up: At [facility], you and your health needs are our priority.  As part of our continuing mission to provide you with exceptional heart care, we have created designated Provider Care Teams.  These Care Teams include your primary  Cardiologist (physician) and Advanced Practice Providers (APPs -  Physician Assistants and Nurse Practitioners) who all work together to provide you with the care you need, when you need it.  We recommend signing up for the patient portal called "MyChart".  Sign up information is provided on this After Visit Summary.  MyChart is used to connect with patients for Virtual Visits (Telemedicine).  Patients are able to view lab/test results, encounter notes, upcoming appointments, etc.  Non-urgent messages can be sent to your provider as well.   To learn more about what you can do with MyChart, go to [email or web address].    Your next appointment:   6 month(s)  The format for your next appointment:   In Person  Provider:   Werner Lean, MD     Other Instructions   You are scheduled for Cardiac MRI on ______________. Please arrive for your appointment at ______________ ( arrive 30-45 minutes prior to test start time). ?  [facility] [address] [place], [place] [address] [phone] Please take advantage of the free valet parking available at the MAIN entrance (A entrance).  Proceed to the [facility] Radiology Department (First Floor) for check-in.   [facility] [place], [place] [address] [phone] Please take advantage of the free valet parking available at the MAIN entrance. Proceed to [facility] registration for check-in (first floor).  Magnetic resonance imaging (MRI) is a painless test that produces images of the inside of the body without using Xrays.  During an MRI, strong magnets and radio waves work  together in a magnetic field to form detailed images.   MRI images may provide more details about a medical condition than X-rays, CT scans, and ultrasounds can provide.  You may be given earphones to listen for instructions.  You may eat a light breakfast and take medications as ordered with the  exception of HCTZ (fluid pill, other). Please avoid stimulants for 12 hr prior to test. (Ie. Caffeine, nicotine, chocolate, or antihistamine medications)  If a contrast material will be used, an IV will be inserted into one of your veins. Contrast material will be injected into your IV. It will leave your body through your urine within a day. You may be told to drink plenty of fluids to help flush the contrast material out of your [name].  You will be asked to remove all metal, including: Watch, jewelry, and other metal objects including hearing aids, hair pieces and dentures. Also wearable glucose monitoring systems (ie. Freestyle Libre and Omnipods) (Braces and fillings normally are not a problem.)   TEST WILL TAKE APPROXIMATELY 1 HOUR  PLEASE NOTIFY SCHEDULING AT LEAST 24 HOURS IN ADVANCE IF YOU ARE UNABLE TO KEEP YOUR APPOINTMENT. [phone]  Please call Marchia Bond, cardiac imaging nurse navigator with any questions/concerns. Marchia Bond RN Navigator Cardiac Imaging Gordy Clement RN Navigator Cardiac Imaging [facility] [phone] Office    Important Information About Sugar         Signed, Shawn Lean, MD  10/24/2022 11:13 AM    [facility]

## 2022-11-02 ENCOUNTER — Other Ambulatory Visit: Payer: Self-pay | Admitting: Urology

## 2022-11-19 ENCOUNTER — Ambulatory Visit: Admission: RE | Admit: 2022-11-19 | Payer: BC Managed Care – PPO | Source: Ambulatory Visit

## 2022-12-01 ENCOUNTER — Telehealth (HOSPITAL_COMMUNITY): Payer: Self-pay | Admitting: *Deleted

## 2022-12-01 ENCOUNTER — Telehealth (HOSPITAL_COMMUNITY): Payer: Self-pay | Admitting: Emergency Medicine

## 2022-12-01 NOTE — Telephone Encounter (Signed)
Patient returning call regarding upcoming cardiac imaging study; pt verbalizes understanding of appt date/time, parking situation and where to check in, and verified current allergies; name and call back number provided for further questions should they arise  Gordy Clement RN Navigator Cardiac [place] [facility] [phone] office [phone] cell  Patient denies metal or claustrophobia.

## 2022-12-01 NOTE — Telephone Encounter (Signed)
Attempted to call patient regarding upcoming cardiac MR appointment. Left message on voicemail with name and callback number Isaih Bulger RN Navigator Cardiac Imaging [place] [facility] [phone] Office [phone] Cell  

## 2022-12-03 ENCOUNTER — Telehealth: Payer: Self-pay | Admitting: Internal Medicine

## 2022-12-03 ENCOUNTER — Other Ambulatory Visit: Payer: Self-pay | Admitting: Internal Medicine

## 2022-12-03 ENCOUNTER — Ambulatory Visit: Payer: BC Managed Care – PPO

## 2022-12-03 ENCOUNTER — Ambulatory Visit (HOSPITAL_COMMUNITY)
Admission: RE | Admit: 2022-12-03 | Discharge: 2022-12-03 | Disposition: A | Discharge status: Home / Self Care | Payer: BC Managed Care – PPO | Source: Ambulatory Visit | Attending: Internal Medicine | Admitting: Internal Medicine

## 2022-12-03 DIAGNOSIS — I5189 Other ill-defined heart diseases: Secondary | ICD-10-CM | POA: Diagnosis not present

## 2022-12-03 MED ORDER — GADOBUTROL 1 MMOL/ML IV SOLN
10.0000 mL | Freq: Once | INTRAVENOUS | Status: AC | PRN
  Administered 2022-12-03: 10 mL via INTRAVENOUS

## 2022-12-03 NOTE — Telephone Encounter (Signed)
Called X2. Voicemail left.  CMR study is within normal limits - study is not suggestive of cardiac amyloidosis (eval in the setting MGUS) - the echodensity seen is a prominent coumadin ridge (this is a normal structure-normal variant anatomy) - no further testing is needed at this time  Wishing him a happy birthday and a Merry Christmas.  Rudean Haskell, MD [facility] [address] [place], [place] [address] [phone]  2:46 PM

## 2022-12-04 ENCOUNTER — Other Ambulatory Visit: Payer: Self-pay | Admitting: Urology

## 2022-12-11 ENCOUNTER — Other Ambulatory Visit: Payer: BC Managed Care – PPO

## 2022-12-18 DIAGNOSIS — J019 Acute sinusitis, unspecified: Secondary | ICD-10-CM | POA: Diagnosis not present

## 2022-12-25 ENCOUNTER — Other Ambulatory Visit: Payer: BC Managed Care – PPO

## 2022-12-25 DIAGNOSIS — E349 Endocrine disorder, unspecified: Secondary | ICD-10-CM

## 2022-12-26 ENCOUNTER — Encounter: Payer: Self-pay | Admitting: Urology

## 2022-12-26 LAB — TESTOSTERONE: Testosterone: 130 ng/dL — ABNORMAL LOW (ref 264–916)

## 2022-12-26 LAB — HEMATOCRIT: Hematocrit: 41.9 % (ref 37.5–51.0)

## 2022-12-30 ENCOUNTER — Other Ambulatory Visit: Payer: Self-pay | Admitting: Urology

## 2023-01-07 ENCOUNTER — Telehealth: Payer: Self-pay | Admitting: Family Medicine

## 2023-01-07 NOTE — Telephone Encounter (Signed)
Abbie Sons, MD sent to Kyra Manges, CMA Have him increase the dose to 300 mg (1.5 cc) every 2 weeks.  Schedule a lab visit for end cycle testosterone level in 2 months  LMOM for patient to return call.

## 2023-01-08 NOTE — Telephone Encounter (Signed)
Unable to reach patient, sent Mychart message.

## 2023-01-18 ENCOUNTER — Other Ambulatory Visit: Payer: Self-pay

## 2023-01-18 ENCOUNTER — Emergency Department (HOSPITAL_COMMUNITY): Payer: BC Managed Care – PPO

## 2023-01-18 ENCOUNTER — Emergency Department (HOSPITAL_COMMUNITY)
Admission: EM | Admit: 2023-01-18 | Discharge: 2023-01-18 | Disposition: A | Discharge status: Home / Self Care | Payer: BC Managed Care – PPO | Attending: Emergency Medicine | Admitting: Emergency Medicine

## 2023-01-18 DIAGNOSIS — H9192 Unspecified hearing loss, left ear: Secondary | ICD-10-CM | POA: Diagnosis not present

## 2023-01-18 DIAGNOSIS — H905 Unspecified sensorineural hearing loss: Secondary | ICD-10-CM

## 2023-01-18 DIAGNOSIS — H9190 Unspecified hearing loss, unspecified ear: Secondary | ICD-10-CM | POA: Diagnosis not present

## 2023-01-18 DIAGNOSIS — H9122 Sudden idiopathic hearing loss, left ear: Secondary | ICD-10-CM | POA: Diagnosis not present

## 2023-01-18 LAB — COMPREHENSIVE METABOLIC PANEL
ALT: 12 U/L (ref 0–44)
AST: 18 U/L (ref 15–41)
Albumin: 3.1 g/dL — ABNORMAL LOW (ref 3.5–5.0)
Alkaline Phosphatase: 46 U/L (ref 38–126)
Anion gap: 6 (ref 5–15)
BUN: 11 mg/dL (ref 8–23)
CO2: 27 mmol/L (ref 22–32)
Calcium: 8.5 mg/dL — ABNORMAL LOW (ref 8.9–10.3)
Chloride: 103 mmol/L (ref 98–111)
Creatinine, Ser: 0.69 mg/dL (ref 0.61–1.24)
GFR, Estimated: >60 mL/min (ref >60)
Glucose, Bld: 118 mg/dL — ABNORMAL HIGH (ref 70–99)
Potassium: 4.5 mmol/L (ref 3.5–5.1)
Sodium: 136 mmol/L (ref 135–145)
Total Bilirubin: 1.1 mg/dL (ref 0.3–1.2)
Total Protein: 7 g/dL (ref 6.5–8.1)

## 2023-01-18 LAB — CBC WITH DIFFERENTIAL/PLATELET
Abs Immature Granulocytes: 0.04 10*3/uL (ref 0.00–0.07)
Basophils Absolute: 0.1 10*3/uL (ref 0.0–0.1)
Basophils Relative: 1 %
Eosinophils Absolute: 0.3 10*3/uL (ref 0.0–0.5)
Eosinophils Relative: 3 %
HCT: 37.6 % — ABNORMAL LOW (ref 39.0–52.0)
Hemoglobin: 12 g/dL — ABNORMAL LOW (ref 13.0–17.0)
Immature Granulocytes: 1 %
Lymphocytes Relative: 14 %
Lymphs Abs: 1.1 10*3/uL (ref 0.7–4.0)
MCH: 29.3 pg (ref 26.0–34.0)
MCHC: 31.9 g/dL (ref 30.0–36.0)
MCV: 91.7 fL (ref 80.0–100.0)
Monocytes Absolute: 0.6 10*3/uL (ref 0.1–1.0)
Monocytes Relative: 7 %
Neutro Abs: 6 10*3/uL (ref 1.7–7.7)
Neutrophils Relative %: 74 %
Platelets: 287 10*3/uL (ref 150–400)
RBC: 4.1 MIL/uL — ABNORMAL LOW (ref 4.22–5.81)
RDW: 13.9 % (ref 11.5–15.5)
WBC: 8 10*3/uL (ref 4.0–10.5)
nRBC: 0 % (ref 0.0–0.2)

## 2023-01-18 LAB — PROTIME-INR
INR: 1.1 (ref 0.8–1.2)
Prothrombin Time: 14.4 seconds (ref 11.4–15.2)

## 2023-01-18 MED ORDER — GADOBUTROL 1 MMOL/ML IV SOLN
9.0000 mL | Freq: Once | INTRAVENOUS | Status: AC | PRN
  Administered 2023-01-18: 9 mL via INTRAVENOUS

## 2023-01-18 MED ORDER — PREDNISONE 20 MG PO TABS: 60.0000 mg | ORAL_TABLET | Freq: Every day | ORAL | 0 refills | Status: AC

## 2023-01-18 MED ORDER — IOHEXOL 300 MG/ML  SOLN
75.0000 mL | Freq: Once | INTRAMUSCULAR | Status: AC | PRN
  Administered 2023-01-18: 75 mL via INTRAVENOUS

## 2023-01-18 NOTE — Discharge Instructions (Addendum)
Contact a health care provider if: You feel dizzy. You develop new symptoms. You vomit or feel nauseous. You have a fever. Get help right away if: You develop sudden changes in your vision. You have severe ear pain. You have new or increased weakness. You have a severe headache.

## 2023-01-18 NOTE — ED Provider Notes (Signed)
[facility] Provider Note   CSN: [id number] Arrival date & time: 01/18/23  6468     History  Chief Complaint  Patient presents with   Hearing Problem    Shawn [name] is a 62 y.o. male who presents emergency department with chief complaint of sudden onset left-sided hearing loss.  Patient states that he was woken out of his sleep at 2:30 in the morning with a piercing ringing in his left ear.  He had complete loss of hearing in the left ear.  He has very minimal hearing in the right ear secondary to a diagnosis of Mnire's disease.  He denies any other new neurologic symptoms.  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  He has recently had sinusitis was treated and completed a course of Augmentin.  He reports foul-smelling purulent and bloody discharge and intermittent bleeding the last naris.  HPI     Home Medications Prior to Admission medications   Medication Sig Start Date End Date Taking? Authorizing Provider  celecoxib (CELEBREX) 200 MG capsule Take 200 mg by mouth 2 (two) times daily. 12/29/21   [provider]  gabapentin (NEURONTIN) 300 MG capsule Take by mouth. Take 1 tab in the morning and 2 tabs in the evening 01/22/22   [provider]  lisinopril (ZESTRIL) 20 MG tablet Take 1 tablet (20 mg total) by mouth every morning. 10/24/22   Rudean Haskell A, MD  metoprolol succinate (TOPROL-XL) 50 MG 24 hr tablet Take 1 tablet (50 mg total) by mouth daily. 10/24/22   Werner Lean, MD  NEEDLE, DISP, 18 G 18G X 1" MISC Use as directed 01/24/22   Stoioff, Ronda Fairly, MD  SYRINGE-NEEDLE, DISP, 3 ML (B-D 3CC LUER-LOK SYR 21GX1-1/2) 21G X 1-1/2" 3 ML MISC USE AS  DIRECTED FOR TESTOSTERONE ADMINISTRATION 07/18/22   Stoioff, Ronda Fairly, MD  tadalafil (CIALIS) 5 MG tablet TAKE 1 TABLET BY MOUTH DAILY AS NEEDED FOR ERECTILE DYSFUNCTION. 12/30/22   Stoioff, Ronda Fairly, MD  testosterone cypionate (DEPOTESTOSTERONE CYPIONATE) 200 MG/ML injection INJECT 1 ML (200 MG TOTAL) INTO THE MUSCLE EVERY 14 DAYS 08/04/22   Stoioff, Ronda Fairly, MD      Allergies    Patient has no known allergies.    Review of Systems   Review of Systems  Physical Exam Updated Vital Signs BP (!) 128/90   Pulse 70   Temp 98.2 F (36.8 C)   Resp 20   Wt 90 kg   SpO2 100%   BMI 26.91 kg/m  Physical Exam Vitals and nursing note reviewed.  Constitutional:      General: He is not in acute distress.    Appearance: He is well-developed. He is not diaphoretic.  HENT:     Head: Normocephalic and atraumatic.     Comments: No sinus tenderness    Right Ear: Tympanic membrane and ear canal normal. Decreased hearing noted.     Left Ear: Tympanic membrane and ear canal normal. Decreased hearing (no hearing) noted.     Nose: No nasal tenderness.     Right Nostril: Epistaxis present.     Right Turbinates: Enlarged and swollen.  Eyes:     General: No visual field deficit or scleral icterus.    Extraocular Movements:  Extraocular movements intact.     Conjunctiva/sclera: Conjunctivae normal.     Pupils: Pupils are equal, round, and reactive to light.  Cardiovascular:     Rate and Rhythm: Normal rate and regular rhythm.     Heart sounds: Normal heart sounds.  Pulmonary:     Effort: Pulmonary effort is normal. No respiratory distress.     Breath sounds: Normal breath sounds.  Abdominal:     Palpations: Abdomen is soft.     Tenderness: There is no abdominal tenderness.  Musculoskeletal:     Cervical back: Normal range of motion and neck supple.  Skin:    General: Skin is warm and dry.     Findings: Rash present.     Comments: BL petechiae   Neurological:     Mental Status: He is alert and  oriented to person, place, and time.     Cranial Nerves: Cranial nerve deficit (acute hearing loss L) present. No dysarthria or facial asymmetry.     Sensory: Sensation is intact.     Motor: Atrophy (quadriceps) and abnormal muscle tone present. No weakness or tremor.     Coordination: Coordination is intact.  Psychiatric:        Behavior: Behavior normal.     ED Results / Procedures / Treatments   Labs (all labs ordered are listed, but only abnormal results are displayed) Labs Reviewed  CBC WITH DIFFERENTIAL/PLATELET - Abnormal; Notable for the following components:      Result Value   RBC 4.10 (*)    Hemoglobin 12.0 (*)    HCT 37.6 (*)    All other components within normal limits  COMPREHENSIVE METABOLIC PANEL  PROTIME-INR    EKG None  Radiology No results found.  Procedures Procedures    Medications Ordered in ED Medications - No data to display  ED Course/ Medical Decision Making/ A&P Clinical Course as of 01/18/23 1500  Sun Jan 18, 2023  2355 Case discussed with Dr. Lorrin Goodell who recommends Lyme PCR, MRI brain IAC, CT temporal bone study [AH]  0922 Calcium(!): 8.5 [AH]  0922 Albumin(!): 3.1 [AH]  0922 Hemoglobin(!): 12.0 [AH]  0922 Platelets: 287 [AH]  1023 MR BRAIN/IAC W WO CONTRAST [AH]  1031 CT Temporal Bones Wo Contrast [AH]  1031 CT Maxillofacial W Contrast [AH]  1031 MR BRAIN/IAC W WO CONTRAST [AH]    Clinical Course User Index [AH] Margarita Mail, PA-C                             Medical Decision Making Patient presents emergency department with acute hearing loss.  Differential diagnosis includes acute sensorineural hearing loss, cholesteatoma, stroke, perforated TM, eustachian tube dysfunction. I ordered and reviewed labs including CBC, CMP, PT/INR all within normal limits.  I discussed the case with Dr. Lorrin Goodell and Dr. Quinn Axe of neurology both of whom agreed that patient should have CT temporal bone MRI brain/IAC contrast study and a Lyme  titer all of which have been sent.  I also ordered a CT maxillofacial with contrast.  I visualized and interpreted all of the images ordered via PACS which showed no acute findings.  I discussed the workup with Dr. Janace Hoard and Dr. Isaias Cowman who both recommended taking 60 mg of prednisone for 10 days.  Dr. Luisa Hart is asked that the patient call the office first thing tomorrow morning to be seen for acute hearing loss.  After review of all data points he does not  appear to have any signs of obstructive hearing loss, TM perforation or active infection.  Patient appears otherwise appropriate for discharge at this time  Amount and/or Complexity of Data Reviewed Labs: ordered. Decision-making details documented in ED Course. Radiology: ordered. Decision-making details documented in ED Course.  Risk Prescription drug management.           Final Clinical Impression(s) / ED Diagnoses Final diagnoses:  Sensorineural hearing loss (SNHL) of left ear, unspecified hearing status on contralateral side    Rx / DC Orders ED Discharge Orders     None         Margarita Mail, PA-C 01/18/23 1507    Pattricia Boss, MD 01/18/23 1623

## 2023-01-18 NOTE — ED Notes (Signed)
Patient transported to MRI 

## 2023-01-18 NOTE — ED Triage Notes (Signed)
Sudden loss of hearing to left ear. Woke up at 2:30am with sharp sound to left ear then woke up PTA with complete hearing loss.  3 weeks ago prescribed amoxicillin for sinus infection. Pt reports sinus infection is no better.  Pt reports already def in right ear d/t meniere's disease  ENT appt 2/20.

## 2023-01-21 DIAGNOSIS — H9122 Sudden idiopathic hearing loss, left ear: Secondary | ICD-10-CM | POA: Diagnosis not present

## 2023-01-21 DIAGNOSIS — J3489 Other specified disorders of nose and nasal sinuses: Secondary | ICD-10-CM | POA: Diagnosis not present

## 2023-01-21 DIAGNOSIS — H9313 Tinnitus, bilateral: Secondary | ICD-10-CM | POA: Diagnosis not present

## 2023-01-21 DIAGNOSIS — H903 Sensorineural hearing loss, bilateral: Secondary | ICD-10-CM | POA: Diagnosis not present

## 2023-01-22 ENCOUNTER — Other Ambulatory Visit (INDEPENDENT_AMBULATORY_CARE_PROVIDER_SITE_OTHER): Payer: BC Managed Care – PPO

## 2023-01-22 ENCOUNTER — Ambulatory Visit: Payer: BC Managed Care – PPO | Attending: Internal Medicine | Admitting: Internal Medicine

## 2023-01-22 ENCOUNTER — Encounter: Payer: Self-pay | Admitting: Internal Medicine

## 2023-01-22 VITALS — BP 136/74 | HR 99 | Ht 72.0 in | Wt 194.0 lb

## 2023-01-22 DIAGNOSIS — I493 Ventricular premature depolarization: Secondary | ICD-10-CM | POA: Diagnosis not present

## 2023-01-22 DIAGNOSIS — I4719 Other supraventricular tachycardia: Secondary | ICD-10-CM

## 2023-01-22 DIAGNOSIS — R55 Syncope and collapse: Secondary | ICD-10-CM

## 2023-01-22 LAB — LYME DISEASE DNA BY PCR(BORRELIA BURG): Lyme Disease(B.burgdorferi)PCR: NEGATIVE

## 2023-01-22 NOTE — Progress Notes (Unsigned)
Applied a 14 day Zio AT  monitor to patient in the the office

## 2023-01-22 NOTE — Patient Instructions (Signed)
Medication Instructions:  Your physician recommends that you continue on your current medications as directed. Please refer to the Current Medication list given to you today.  *If you need a refill on your cardiac medications before your next appointment, please call your pharmacy*   Follow-Up: At [facility], you and your health needs are our priority.  As part of our continuing mission to provide you with exceptional heart care, we have created designated Provider Care Teams.  These Care Teams include your primary Cardiologist (physician) and Advanced Practice Providers (APPs -  Physician Assistants and Nurse Practitioners) who all work together to provide you with the care you need, when you need it.    Your next appointment:   As scheduled   Provider:   Werner Lean, MD     Other Instructions ZIO AT Long term monitor-Live Telemetry  Your physician has requested you wear a ZIO patch monitor for 14 days.  This is a single patch monitor. [employer] supplies one patch monitor per enrollment. Additional  stickers are not available.  Please do not apply patch if you will be having a Nuclear Stress Test, Echocardiogram, Cardiac CT, MRI,  or Chest Xray during the period you would be wearing the monitor. The patch cannot be worn during  these tests. You cannot remove and re-apply the ZIO AT patch monitor.  Your ZIO patch monitor will be mailed 3 day USPS to your address on file. It may take 3-5 days to  receive your monitor after you have been enrolled.  Once you have received your monitor, please review the enclosed instructions. Your monitor has  already been registered assigning a specific monitor serial # to you.   Billing and Patient Assistance Program information  Shawn [name] has been supplied with any insurance information on record for billing. [employer] offers a sliding scale Patient Assistance Program for patients without insurance, or whose  insurance does not  completely cover the cost of the ZIO patch monitor. You must apply for the  Patient Assistance Program to qualify for the discounted rate. To apply, call [employer] at [phone],  select option 4, select option 2 , ask to apply for the Patient Assistance Program, (you can request an  interpreter if needed). [employer] will ask your household income and how many people are in your  household. [employer] will quote your out-of-pocket cost based on this information. They will also be able  to set up a 12 month interest free payment plan if needed.  Applying the monitor   Shave hair from upper left chest.  Hold the abrader disc by orange tab. Rub the abrader in 40 strokes over left upper chest as indicated in  your monitor instructions.  Clean area with 4 enclosed alcohol pads. Use all pads to ensure the area is cleaned thoroughly. Let  dry.  Apply patch as indicated in monitor instructions. Patch will be placed under collarbone on left side of  chest with arrow pointing upward.  Rub patch adhesive wings for 2 minutes. Remove the white label marked "1". Remove the white label  marked "2". Rub patch adhesive wings for 2 additional minutes.  While looking in a mirror, press and release button in center of patch. A small green light will flash 3-4  times. This will be your only indicator that the monitor has been turned on.  Do not shower for the first 24 hours. You may shower after the first 24 hours.  Press the button if you feel a  symptom. You will hear a small click. Record Date, Time and Symptom in  the Patient Log.   Starting the Gateway  In your kit there is a Hydrographic surveyor box the size of a cellphone. This is Airline pilot. It transmits all your  recorded data to [facility]. This box must always stay within 10 feet of you. Open the box and push the *  button. There will be a light that blinks orange and then green a few times. When the light stops  blinking, the Gateway is connected to the ZIO  patch. Call [employer] at [phone] to confirm your monitor is transmitting.  Returning your monitor  Remove your patch and place it inside the [place]. In the lower half of the Gateway there is a white  bag with prepaid postage on it. Place Gateway in bag and seal. Mail package back to [facility] as soon as  possible. Your physician should have your final report approximately 7 days after you have mailed back  your monitor. Call [facility] at [phone] if you have questions regarding your ZIO AT  patch monitor. Call them immediately if you see an orange light blinking on your monitor.  If your monitor falls off in less than 4 days, contact our Monitor department at [phone]. If your  monitor becomes loose or falls off after 4 days call [employer] at [phone] for suggestions on  securing your monitor

## 2023-01-22 NOTE — Progress Notes (Signed)
Cardiology Office Note:    Date:  01/22/2023   ID:  Shawn [name], DOB 12-20-1960, MRN [id number]  PCP:  [place] [facility] Cardiologist:  Werner Lean, MD     Referring MD: [facility]*   CC: palpitations  History of Present Illness:    [name] is a 62 y.o. male with a hx of AT NOS, MGUS HTN, who presents 06/12/22. 2023: he lost weight.  Could not rule out left atrial mass.  Planned for CMR then lost to follow up CMR no evidence of Cardiac Amyloidosis. He is friends with [name], a patient who I saw briefly who died from AL Amyloidosis.  Patient notes that he as sudden onset hearing loss.   Woke up with sudden onset hear loss in in right ear. When to ED- did not have a stroke. MRI showed edema, he was started on steroid. Saw an ENT and had new nasal draining.  Incidentally had low albumin and calcium He has a new rash on his legs (red macular) Has leg swelling that improved on steroids Hearing has yet to improve on steroids.  He has had new fast and slow palpitations since starting prednisone.  He is argubly worse on the metoprolol. 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   No chest pain or pressure .  No SOB/DOE and no PND/Orthopnea.    Provider Note: He can minimally hear with the hearing aide.  If I type on a Microsoft word screen with large font he can read it over me.   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-followed by Dr Arnoldo Lenis   Multiple thyroid nodules    Onychomycosis    treated with lamisil in the past   Other chronic pain    Pain in right hand    Palpitations    Prediabetes    Pruritus    Retinal detachment    right eye caused blindness   Tinea cruris    Tinnitus    Total retinal detachment of right eye     Past Surgical History:  Procedure Laterality Date   APPENDECTOMY     CYSTOSCOPY W/ URETERAL STENT PLACEMENT  08/24/2012   Procedure: CYSTOSCOPY WITH RETROGRADE PYELOGRAM/URETERAL STENT PLACEMENT;  Surgeon: Ailene Rud, MD;  Location: [facility];  Service: Urology;  Laterality: Right;   CYSTOSCOPY/RETROGRADE/URETEROSCOPY  08/24/2012   Procedure: CYSTOSCOPY/RETROGRADE/URETEROSCOPY;  Surgeon: Ailene Rud, MD;  Location: [facility];  Service: Urology;  Laterality: Right;  Stone Basketing   CYSTOSCOPY/RETROGRADE/URETEROSCOPY/STONE EXTRACTION WITH BASKET Left 04/11/2013   Procedure: CYSTOSCOPY/RETROGRADE/URETEROSCOPY/STONE EXTRACTION WITH BASKET;  Surgeon: Ailene Rud, MD;  Location: [facility];  Service: Urology;  Laterality: Left;  BASKET STONE EXTRACTION WITH URETEROSCOPE     Right retina detachment      Current Medications: Current Meds  Medication Sig   celecoxib (CELEBREX) 200 MG capsule Take 200 mg by mouth 2 (two) times daily.   gabapentin (NEURONTIN) 300 MG capsule Take by mouth. Take 1 tab in the morning and 2 tabs in the evening   lisinopril (ZESTRIL) 20 MG tablet Take 1 tablet (20 mg total) by mouth every morning.   metoprolol succinate (TOPROL-XL) 50 MG 24 hr tablet Take 1 tablet (50 mg total) by mouth daily.   predniSONE (DELTASONE) 20 MG tablet Take 3 tablets (60 mg total) by mouth daily with breakfast for 10 days.   SYRINGE-NEEDLE, DISP, 3 ML (B-D 3CC LUER-LOK SYR 21GX1-1/2) 21G X 1-1/2" 3 ML MISC USE AS DIRECTED FOR TESTOSTERONE ADMINISTRATION   tadalafil (CIALIS) 5 MG tablet TAKE 1 TABLET BY MOUTH DAILY AS  NEEDED FOR ERECTILE DYSFUNCTION.   testosterone cypionate (DEPOTESTOSTERONE CYPIONATE) 200 MG/ML injection INJECT 1 ML (200 MG TOTAL) INTO THE MUSCLE EVERY 14 DAYS    Allergies:   Patient has no known allergies.   Social History   Socioeconomic History   Marital status: Married    Spouse name: Not on file   Number of children: Not on file   Years of education: Not on file   Highest education level: Not on file  Occupational History   Not on file  Tobacco Use   Smoking status: Former    Types: Pipe   Smokeless tobacco: Never  Substance and Sexual Activity   Alcohol use: Yes    Comment: ocassionally beer or wine   Drug use: No   Sexual activity: Never  Other Topics Concern   Not on file  Social History Narrative   Not on file   Social Determinants of Health   Financial Resource Strain: Not on file  Food Insecurity: Not on file  Transportation Needs: Not on file  Physical Activity: Not on file  Stress: Not on file  Social Connections: Not on file    Social: [place] friends of Gena Fray, new Jim [place], he goes to [employer]; in 2024 I met his wife  Family History: The patient's family history includes Aneurysm in his mother and paternal grandmother; Bladder Cancer in his brother; CAD in his father; Diabetes in his mother; Heart attack in his father and paternal grandfather; Heart disease in his father; Hypertension in his father; Stroke in his mother. There is no history of Thyroid disease.  ROS:   Please see the history of present illness.     All other systems reviewed and are negative.  EKGs/Labs/Other Studies Reviewed:    The following studies were reviewed today:  EKG:   06/12/22: SR 90   Cardiac Studies & Procedures       ECHOCARDIOGRAM  ECHOCARDIOGRAM COMPLETE 07/04/2022  Narrative ECHOCARDIOGRAM REPORT    Patient Name:   Shawn [name] Date of Exam: 07/04/2022 Medical Rec #:  [id number]     Height:       72.0 in Accession #:     [id number]    Weight:       191.0 lb Date of Birth:  06-21-1961    BSA:          2.089 m Patient Age:    81 years      BP:           98/64 mmHg Patient Gender: M             HR:           84 bpm. Exam Location:  [address]  Procedure: 2D Echo, 3D Echo, Cardiac Doppler, Color Doppler and Strain Analysis  Indications:    r60.0 Lower extremity edema  History:        Patient has no prior history of Echocardiogram examinations. Arrythmias:Palpitations, Signs/Symptoms:Chest Pain and Dyspnea; Risk Factors:Hypertension and Pre-diabetes.  Sonographer:    Cresenciano Lick RDCS Referring Phys: [id number] [facility] A Zollie Ellery  IMPRESSIONS   1. Left ventricular ejection fraction, by estimation, is 55 to 60%. Left ventricular ejection fraction by 3D volume is 56 %. The left ventricle has normal function. The left ventricle has no regional wall motion abnormalities. Left ventricular diastolic parameters were normal. 2. Right ventricular systolic function is normal. The right ventricular size is normal. Tricuspid regurgitation signal is inadequate for assessing PA pressure. 3. The mitral valve is normal in structure. Trivial mitral valve regurgitation. No evidence of mitral stenosis. 4. The aortic valve is tricuspid. Aortic valve regurgitation is not visualized. 5. Small Echodensity seen in left atrium in imaging series 58 and 64 not seen in other series.  Conclusion(s)/Recommendation(s): Consider CMR if clinically indicated.  FINDINGS Left Ventricle: Left ventricular ejection fraction, by estimation, is 55 to 60%. Left ventricular ejection fraction by 3D volume is 56 %. The left ventricle has normal function. The left ventricle has no regional wall motion abnormalities. The left ventricular internal cavity size was normal in size. There is no left ventricular hypertrophy. Left ventricular diastolic parameters were normal.  Right Ventricle: The right ventricular size is normal. No increase  in right ventricular wall thickness. Right ventricular systolic function is normal. Tricuspid regurgitation signal is inadequate for assessing PA pressure.  Left Atrium: Left atrial size was normal in size.  Right Atrium: Right atrial size was normal in size.  Pericardium: There is no evidence of pericardial effusion.  Mitral Valve: The mitral valve is normal in structure. Trivial mitral valve regurgitation. No evidence of mitral valve stenosis.  Tricuspid Valve: The tricuspid valve is normal in structure. Tricuspid valve regurgitation is trivial. No evidence of tricuspid stenosis.  Aortic Valve: The aortic valve is tricuspid. Aortic valve regurgitation is not visualized.  Pulmonic Valve: The pulmonic valve was normal in structure. Pulmonic valve regurgitation is mild. No evidence of pulmonic stenosis.  Aorta: The aortic root and ascending aorta are structurally normal, with no evidence of dilitation.  IAS/Shunts: The atrial septum is grossly normal.   LEFT VENTRICLE PLAX 2D LVIDd:         4.90 cm         Diastology LVIDs:         3.30 cm         LV e' medial:    7.72 cm/s LV PW:         0.90 cm         LV E/e' medial:  14.8 LV IVS:        0.90 cm         LV e' lateral:   13.10 cm/s LVOT diam:     2.00 cm         LV E/e' lateral: 8.7 LV SV:         67 LV SV Index:   32              2D LVOT Area:     3.14 cm        Longitudinal Strain 2D Strain GLS  -23.5 % (A2C): 2D Strain GLS  -22.2 % (A3C): 2D Strain GLS  -23.7 % (A4C): 2D Strain  GLS  -23.2 % Avg:  3D Volume EF LV 3D EF:    Left ventricul ar ejection fraction by 3D volume is 56 %.  3D Volume EF: 3D EF:        56 % LV EDV:       168 ml LV ESV:       74 ml LV SV:        94 ml  RIGHT VENTRICLE             IVC RV Basal diam:  3.90 cm     IVC diam: 2.00 cm RV S prime:     14.70 cm/s TAPSE (M-mode): 2.0 cm  LEFT ATRIUM             Index        RIGHT ATRIUM           Index LA diam:        4.60 cm 2.20 cm/m    RA Area:     11.60 cm LA Vol (A2C):   51.9 ml 24.84 ml/m  RA Volume:   25.90 ml  12.40 ml/m LA Vol (A4C):   48.2 ml 23.07 ml/m LA Biplane Vol: 50.1 ml 23.98 ml/m AORTIC VALVE LVOT Vmax:   108.67 cm/s LVOT Vmean:  70.633 cm/s LVOT VTI:    0.213 m  AORTA Ao Root diam: 3.50 cm Ao Asc diam:  3.90 cm  MITRAL VALVE MV Area (PHT): 3.75 cm     SHUNTS MV Decel Time: 203 msec     Systemic VTI:  0.21 m MV E velocity: 114.50 cm/s  Systemic Diam: 2.00 cm MV A velocity: 78.60 cm/s MV E/A ratio:  1.46  Rudean Haskell MD Electronically signed by Rudean Haskell MD Signature Date/Time: 07/04/2022/11:31:50 AM    Final    MONITORS  LONG TERM MONITOR (3-14 DAYS) 07/02/2022  Narrative   Patient had a minimum heart rate of 55 bpm, maximum heart rate of 146 bpm, and average heart rate of 82 bpm.   Predominant underlying rhythm was sinus rhythm.   Five short runs of Paroxsymal SVT lasting 10 beats at longest with a max rate of 146 bpm at fastest.   Isolated PACs were occasional (1.3%).   Isolated PVCs were rare (<1.0%).   Triggered and diary events associated with sinus rhythm.  No malignant arrhythmias.    CARDIAC MRI  MR CARDIAC MORPHOLOGY W WO CONTRAST 12/03/2022  Narrative CLINICAL DATA:  Clinical question of amyloid (hx of MGUS with risk factors),mass eval. Study assumes BSA of 2.14 m2  EXAM: CARDIAC MRI  TECHNIQUE: The patient was scanned on a 1.5 Tesla GE magnet. A dedicated cardiac coil was used. Functional imaging was done using Fiesta sequences. 2,3, and 4 chamber views were done to assess for RWMA's. Modified Simpson's rule using a short axis stack was used to calculate an ejection fraction on a dedicated work Conservation officer, nature. The patient received 10 cc of Gadavist. After 10 minutes inversion recovery sequences were used to assess for infiltration and scar tissue. Flow quantification was performed 2 times during this examination with  flow quantification performed at the levels of the ascending aorta above the valve, pulmonary artery above the valve.  CONTRAST:  10 cc  of Gadavist  FINDINGS: 1. Very mild increase in left ventricular size, with LVEDD 54 mm, but LVEDV 200 mL, and LVEDVi 93 mL/m2.  Normal left ventricular thickness, with intraventricular septal thickness of 10 mm, posterior wall thickness of 7 mm,  and myocardial mass index of 55 g/m2.  Normal left ventricular systolic function (LVEF =10%). There are no regional wall motion abnormalities.  Left ventricular parametric mapping notable for normal native T1 and T2 signal.  There is no late gadolinium enhancement in the left ventricular myocardium.  2.  Mild increase in right ventricular size with RVEDVI 114 mL/m2.  Normal right ventricular thickness.  Normal right ventricular systolic function (RVEF =93%). There are no regional wall motion abnormalities or aneurysms.  3. Normal left and right atrial size. There is a prominent coumadin ridge that appears to bifurcate. It is isointense on T1 and T2. There is no evidence of enhancement. High inversion time sequence not performed.  4. Normal size of the aortic root, ascending aorta and pulmonary artery.  5. Valve assessment:  Aortic Valve: Tri-leaflet aortic valve. There is no significant regurgitation. Regurgitant fraction 1%.  Pulmonic Valve: There is no significant regurgitation. Regurgitant fraction 2%.  Tricuspid Valve: Qualitatively there is no significant regurgitation. The is breathhold artifact on RV volumes.  Mitral Valve: There is no significant regurgitation. Regurgitant fraction 4%.  6.  Normal pericardium.  No pericardial effusion.  7. Grossly, no extracardiac findings. Recommended dedicated study if concerned for non-cardiac pathology.  IMPRESSION: Study not consistent with cardiac amyloidosis.  Left atrial echodensity is likely prominent coumadin ridge  (normal variant structure).  Rudean Haskell MD   Electronically Signed By: Rudean Haskell M.D. On: 12/03/2022 14:25          Recent Labs: 01/27/2022: TSH 1.270 06/12/2022: NT-Pro BNP 84 01/18/2023: ALT 12; BUN 11; Creatinine, Ser 0.69; Hemoglobin 12.0; Platelets 287; Potassium 4.5; Sodium 136  Recent Lipid Panel No results found for: "CHOL", "TRIG", "HDL", "CHOLHDL", "VLDL", "LDLCALC", "LDLDIRECT"      Physical Exam:    VS:  BP 136/74   Pulse 99   Ht 6' (1.829 m)   Wt 194 lb (88 kg)   SpO2 98%   BMI 26.31 kg/m     Wt Readings from Last 3 Encounters:  01/22/23 194 lb (88 kg)  01/18/23 198 lb 6.6 oz (90 kg)  10/24/22 199 lb (90.3 kg)    Gen: Mild distress distress   Neck: No JVD Cardiac: No Rubs or Gallops, no murmur, RRR, +2 radial pulses Respiratory: Clear to auscultation bilaterally, normal effort, normal  respiratory rate GI: Soft, nontender, non-distended  MS: non pitting edema;  moves all extremities Integument: faint bilateral macular rash on shins Neuro:  At time of evaluation, alert and oriented to person/place/time/situation  Psych: Normal affect, patient feels ok  ASSESSMENT:    1. Atrial tachycardia   2. PVC's (premature ventricular contractions)   3. Near syncope      PLAN:     Hx of AT - with tachy brady symptoms - Live heart monitor; this may all be prednisone related - continue BB  Hypocalcemia and hypoalbuminenia with hearing loss - waiting for PCP eval in March - Low Ca may be albumin related; ionized calcium ordered - should get testing for albuminuria; I worry he has a nephrotic syndrome with leg swelling, low albumin, and rash; this is less consistent with Alport syndrome- thought he does have new hearing loss  HTN - continue ACE  Continue our scheduled f/u  Time Spent Directly with Patient:   I have spent a total of 40 minutes with the patient reviewing notes, imaging, EKGs, labs and examining the patient as well  as establishing an assessment and plan that was discussed personally  with the patient.  > 50% of time was spent in direct patient care and family.         Medication Adjustments/Labs and Tests Ordered: Current medicines are reviewed at length with the patient today.  Concerns regarding medicines are outlined above.  Orders Placed This Encounter  Procedures   LONG TERM MONITOR-LIVE TELEMETRY (3-14 DAYS)   No orders of the defined types were placed in this encounter.   Patient Instructions  Medication Instructions:  Your physician recommends that you continue on your current medications as directed. Please refer to the Current Medication list given to you today.  *If you need a refill on your cardiac medications before your next appointment, please call your pharmacy*   Follow-Up: At [facility], you and your health needs are our priority.  As part of our continuing mission to provide you with exceptional heart care, we have created designated Provider Care Teams.  These Care Teams include your primary Cardiologist (physician) and Advanced Practice Providers (APPs -  Physician Assistants and Nurse Practitioners) who all work together to provide you with the care you need, when you need it.    Your next appointment:   As scheduled   Provider:   Werner Lean, MD     Other Instructions ZIO AT Long term monitor-Live Telemetry  Your physician has requested you wear a ZIO patch monitor for 14 days.  This is a single patch monitor. [employer] supplies one patch monitor per enrollment. Additional  stickers are not available.  Please do not apply patch if you will be having a Nuclear Stress Test, Echocardiogram, Cardiac CT, MRI,  or Chest Xray during the period you would be wearing the monitor. The patch cannot be worn during  these tests. You cannot remove and re-apply the ZIO AT patch monitor.  Your ZIO patch monitor will be mailed 3 day USPS to your address on  file. It may take 3-5 days to  receive your monitor after you have been enrolled.  Once you have received your monitor, please review the enclosed instructions. Your monitor has  already been registered assigning a specific monitor serial # to you.   Billing and Patient Assistance Program information  Theodore Demark has been supplied with any insurance information on record for billing. [employer] offers a sliding scale Patient Assistance Program for patients without insurance, or whose  insurance does not completely cover the cost of the ZIO patch monitor. You must apply for the  Patient Assistance Program to qualify for the discounted rate. To apply, call [employer] at [phone],  select option 4, select option 2 , ask to apply for the Patient Assistance Program, (you can request an  interpreter if needed). [employer] will ask your household income and how many people are in your  household. [employer] will quote your out-of-pocket cost based on this information. They will also be able  to set up a 12 month interest free payment plan if needed.  Applying the monitor   Shave hair from upper left chest.  Hold the abrader disc by orange tab. Rub the abrader in 40 strokes over left upper chest as indicated in  your monitor instructions.  Clean area with 4 enclosed alcohol pads. Use all pads to ensure the area is cleaned thoroughly. Let  dry.  Apply patch as indicated in monitor instructions. Patch will be placed under collarbone on left side of  chest with arrow pointing upward.  Rub patch adhesive wings for 2 minutes. Remove the white  label marked "1". Remove the white label  marked "2". Rub patch adhesive wings for 2 additional minutes.  While looking in a mirror, press and release button in center of patch. A small green light will flash 3-4  times. This will be your only indicator that the monitor has been turned on.  Do not shower for the first 24 hours. You may shower after the first 24 hours.   Press the button if you feel a symptom. You will hear a small click. Record Date, Time and Symptom in  the Patient Log.   Starting the Gateway  In your kit there is a Hydrographic surveyor box the size of a cellphone. This is Airline pilot. It transmits all your  recorded data to [facility]. This box must always stay within 10 feet of you. Open the box and push the *  button. There will be a light that blinks orange and then green a few times. When the light stops  blinking, the Gateway is connected to the ZIO patch. Call [employer] at [phone] to confirm your monitor is transmitting.  Returning your monitor  Remove your patch and place it inside the [place]. In the lower half of the Gateway there is a white  bag with prepaid postage on it. Place Gateway in bag and seal. Mail package back to [place] as soon as  possible. Your physician should have your final report approximately 7 days after you have mailed back  your monitor. Call [place] at [phone] if you have questions regarding your ZIO AT  patch monitor. Call them immediately if you see an orange light blinking on your monitor.  If your monitor falls off in less than 4 days, contact our Monitor department at [phone]. If your  monitor becomes loose or falls off after 4 days call [employer] at [phone] for suggestions on  securing your monitor    Signed, Werner Lean, MD  01/22/2023 6:06 PM    [place]

## 2023-01-23 ENCOUNTER — Telehealth: Payer: Self-pay | Admitting: Urology

## 2023-01-23 DIAGNOSIS — I4719 Other supraventricular tachycardia: Secondary | ICD-10-CM | POA: Diagnosis not present

## 2023-01-23 DIAGNOSIS — I493 Ventricular premature depolarization: Secondary | ICD-10-CM | POA: Diagnosis not present

## 2023-01-23 MED ORDER — TESTOSTERONE CYPIONATE 200 MG/ML IM SOLN: 300.0000 mg | INTRAMUSCULAR | 0 refills | Status: DC

## 2023-01-23 NOTE — Telephone Encounter (Signed)
Patient's wife called to request refill of Testosterone with increased dose of 1.5 cc every two weeks. Pharmacy is [facility] in [place]

## 2023-01-26 ENCOUNTER — Encounter: Payer: Self-pay | Admitting: Internal Medicine

## 2023-01-28 ENCOUNTER — Encounter: Payer: Self-pay | Admitting: Internal Medicine

## 2023-01-28 DIAGNOSIS — R634 Abnormal weight loss: Secondary | ICD-10-CM | POA: Diagnosis not present

## 2023-01-28 DIAGNOSIS — D649 Anemia, unspecified: Secondary | ICD-10-CM | POA: Diagnosis not present

## 2023-01-28 DIAGNOSIS — R195 Other fecal abnormalities: Secondary | ICD-10-CM | POA: Diagnosis not present

## 2023-01-30 ENCOUNTER — Encounter: Payer: Self-pay | Admitting: *Deleted

## 2023-01-30 ENCOUNTER — Ambulatory Visit (INDEPENDENT_AMBULATORY_CARE_PROVIDER_SITE_OTHER): Payer: BC Managed Care – PPO | Admitting: Physician Assistant

## 2023-01-30 ENCOUNTER — Other Ambulatory Visit (INDEPENDENT_AMBULATORY_CARE_PROVIDER_SITE_OTHER): Payer: BC Managed Care – PPO

## 2023-01-30 ENCOUNTER — Other Ambulatory Visit (HOSPITAL_COMMUNITY): Payer: BLUE CROSS/BLUE SHIELD

## 2023-01-30 VITALS — BP 120/60 | HR 91 | Ht 73.0 in | Wt 190.4 lb

## 2023-01-30 DIAGNOSIS — R195 Other fecal abnormalities: Secondary | ICD-10-CM | POA: Diagnosis not present

## 2023-01-30 DIAGNOSIS — R634 Abnormal weight loss: Secondary | ICD-10-CM | POA: Diagnosis not present

## 2023-01-30 LAB — SEDIMENTATION RATE: Sed Rate: 69 mm/hr — ABNORMAL HIGH (ref 0–20)

## 2023-01-30 LAB — TSH: TSH: 0.27 u[IU]/mL — ABNORMAL LOW (ref 0.35–5.50)

## 2023-01-30 LAB — HIGH SENSITIVITY CRP: CRP, High Sensitivity: 23.6 mg/L — ABNORMAL HIGH (ref 0.000–5.000)

## 2023-01-30 MED ORDER — NA SULFATE-K SULFATE-MG SULF 17.5-3.13-1.6 GM/177ML PO SOLN: 1.0000 | Freq: Once | ORAL | 0 refills | Status: AC

## 2023-01-30 NOTE — Progress Notes (Signed)
Subjective:    Patient ID: Shawn [name], male    DOB: 1961/09/26, 62 y.o.   MRN: [id number]  HPI  Shawn [name] is a pleasant 62 year old white male, new to GI today referred by Shawn Beals, NP/[facility] to discuss colonoscopy in setting of recent weight loss, and intermittent dark stools. Patient says that he had a remote colonoscopy perhaps around age 43, or earlier and does not recall who did that procedure, he says he was told to follow-up in 10 years. He has had multiple health issues over the past year or so and has seen several specialists but says he does not have any definite answers to a lot of his problems.  He has been diagnosed with MGUS, has history of atrial tachycardia, hypertension, and has recently been noted on labs to have a low albumin (3.1), and low calcium level (8.5), and very mild anemia with hemoglobin 12/hematocrit 37.6 as of 01/18/2023. He started losing weight a little over a year ago and at that time was 220 pounds, he is down to 185 at home now and says that over the past week he has lost about 10 pounds by his scale.  He was 190 pounds by our scale today. He and his wife both state that his appetite is very good he eats very well he has no complaints of nausea or vomiting, no dysphagia or odynophagia, no regular heartburn or indigestion, he does get intermittent heartburn for which he uses Tums.  He denies any abdominal pain or postprandial abdominal pain.  He is not noticed any real changes in his bowel habits and says he has had loose stools for a long time.  He says his stool is usually dark brown and has not noticed any recent changes.  He occasionally will have some bright red blood noted on the tissue and has had intermittent issues with anal fissures which is not currently causing him any symptoms. He says he feels okay right now, his wife says he fatigues very easily.  He has also had issues with loss of sensation from his chin to his foot bilaterally, he has seen a  neurosurgeon recently.  He had been told in the past that he had a goiter and says occasionally he feels some swelling on 1 side or the other of his thyroid. He has not had any recent abdominal imaging.  They are worried about malignancy with his current symptoms.  Review of Systems Pertinent positive and negative review of systems were noted in the above HPI section.  All other review of systems was otherwise negative.   Outpatient Encounter Medications as of 01/30/2023  Medication Sig   celecoxib (CELEBREX) 200 MG capsule Take 200 mg by mouth 2 (two) times daily.   gabapentin (NEURONTIN) 300 MG capsule Take by mouth. Take 1 tab in the morning and 2 tabs in the evening   lisinopril (ZESTRIL) 20 MG tablet Take 1 tablet (20 mg total) by mouth every morning.   metoprolol succinate (TOPROL-XL) 50 MG 24 hr tablet Take 1 tablet (50 mg total) by mouth daily.   mupirocin ointment (BACTROBAN) 2 % 1 Application 2 (two) times daily.   Na Sulfate-K Sulfate-Mg Sulf 17.5-3.13-1.6 GM/177ML SOLN Take 1 kit by mouth once for 1 dose.   NEEDLE, DISP, 18 G 18G X 1" MISC Use as directed   SYRINGE-NEEDLE, DISP, 3 ML (B-D 3CC LUER-LOK SYR 21GX1-1/2) 21G X 1-1/2" 3 ML MISC USE AS DIRECTED FOR TESTOSTERONE ADMINISTRATION   tadalafil (CIALIS) 5 MG  tablet TAKE 1 TABLET BY MOUTH DAILY AS NEEDED FOR ERECTILE DYSFUNCTION.   testosterone cypionate (DEPOTESTOSTERONE CYPIONATE) 200 MG/ML injection Inject 1.5 mLs (300 mg total) into the muscle every 14 (fourteen) days.   No facility-administered encounter medications on file as of 01/30/2023.   No Known Allergies Patient Active Problem List   Diagnosis Date Noted   PVC's (premature ventricular contractions) 01/22/2023   Near syncope 01/22/2023   Claudication of both lower extremities ([place]) 06/12/2022   Atrial tachycardia 06/12/2022   Unexplained weight loss 06/12/2022   Lower extremity edema 06/12/2022   MGUS (monoclonal gammopathy of unknown significance) 02/07/2022    Multinodular goiter 10/04/2015   GERD (gastroesophageal reflux disease) 05/22/2015   Chest pain 12/19/2014   Sepsis(995.91) 08/27/2012   UTI (lower urinary tract infection) 08/26/2012   Hydronephrosis with obstructing calculus 08/25/2012   Hyponatremia 08/24/2012   Nephrolithiasis 08/24/2012   Acute renal failure ([place]) 08/24/2012   Leukocytosis 08/24/2012   Hypertension    Blind right eye    Social History   Socioeconomic History   Marital status: Married    Spouse name: Not on file   Number of children: Not on file   Years of education: Not on file   Highest education level: Not on file  Occupational History   Not on file  Tobacco Use   Smoking status: Former    Types: Pipe   Smokeless tobacco: Never  Substance and Sexual Activity   Alcohol use: Yes    Comment: ocassionally beer or wine   Drug use: No   Sexual activity: Never  Other Topics Concern   Not on file  Social History Narrative   Not on file   Social Determinants of Health   Financial Resource Strain: Not on file  Food Insecurity: Not on file  Transportation Needs: Not on file  Physical Activity: Not on file  Stress: Not on file  Social Connections: Not on file  Intimate Partner Violence: Not on file    Mr. Shawn [name]'s family history includes Aneurysm in his mother and paternal grandmother; Bladder Cancer in his brother; CAD in his father; Cancer in his mother; Diabetes in his maternal grandmother and mother; Heart attack in his father and paternal grandfather; Heart disease in his father; Hypertension in his father, maternal grandmother, and paternal grandfather; Stroke in his mother.      Objective:    Vitals:   01/30/23 1031  BP: 120/60  Pulse: 91  SpO2: 97%    Physical Exam Well-developed older white male in no acute distress.  Accompanied by wife, patient is hard of hearing- height, Weight, 190 BMI 25.1  HEENT; nontraumatic normocephalic, EOMI, PE R LA, sclera anicteric. 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  This is in the setting of having a good  appetite, eating normally and not having any nausea or vomiting. Etiology of the weight loss is not clear, I am not certain that this is of GI origin but certainly needs to be considered.  Rule out occult neoplasm, rule out hyperthyroidism, parathyroid disease, other progressive inflammatory process  #2 dark stool without any overt blood, no recent Hemoccults.  Mild normocytic anemia with most recent hemoglobin of 12  #3 history of MGUS #4.  Hypoalbuminemia #5.  New hypocalcemia. #6.  Bilateral lower extremity numbness #7 history of atrial tachycardia #8.  Hypertension #9.  New abrupt hearing loss in the left ear  Plan; Patient will be scheduled for colonoscopy and EGD with Dr. Silverio Decamp.  Both procedures were discussed in detail with the patient including indications risk and benefits and he is agreeable to proceed. Will schedule for CT of the abdomen and pelvis with contrast Check TSH, sed rate and CRP today Add protein supplements i.e. Ensure or boost twice daily Further recommendations pending results of above.    Hakeem Frazzini Genia Harold PA-C 01/30/2023   Cc: [place]

## 2023-01-30 NOTE — Patient Instructions (Addendum)
Your provider has requested that you go to the basement level for lab work before leaving today. Press "B" on the elevator. The lab is located at the first door on the left as you exit the elevator.   You have been scheduled for a CT scan of the abdomen and pelvis at [facility], 1st floor Radiology. You are scheduled on 02/11/2023 at 3:30PM. You should arrive 15 minutes prior to your appointment time for registration.  We are giving you 2 bottles of contrast today that you will need to drink before arriving for the exam. The solution may taste better if refrigerated so put them in the refrigerator when you get home, but do NOT add ice or any other liquid to this solution as that would dilute it. Shake well before drinking.   Please follow the written instructions below on the day of your exam:   1) Do not eat anything after 11:30AM (4 hours prior to your test)    You may take any medications as prescribed with a small amount of water, if necessary. If you take any of the following medications: METFORMIN, GLUCOPHAGE, GLUCOVANCE, AVANDAMET, RIOMET, FORTAMET, [place] MET, JANUMET, GLUMETZA or METAGLIP, you MAY be asked to HOLD this medication 48 hours AFTER the exam.    Depending on your individual set of symptoms, you may also receive an intravenous injection of x-ray contrast/dye. Plan on being at [facility] for 45 minutes or longer, depending on the type of exam you are having performed.   If you have any questions regarding your exam or if you need to reschedule, you may call [facility] at [phone] between the hours of 8:00 am and 5:00 pm, Monday-Friday.     You have been scheduled for an endoscopy and colonoscopy. Please follow the written instructions given to you at your visit today. Please pick up your prep supplies at the pharmacy within the next 1-3 days. If you use inhalers (even only as needed), please bring them with you on the day of your  procedure. ___________________________________________  If your blood pressure at your visit was 140/90 or greater, please contact your primary care physician to follow up on this.  _______________________________________________________  If you are age 17 or older, your body mass index should be between 23-30. Your Body mass index is 25.12 kg/m. If this is out of the aforementioned range listed, please consider follow up with your Primary Care Provider.  If you are age 88 or younger, your body mass index should be between 19-25. Your Body mass index is 25.12 kg/m. If this is out of the aformentioned range listed, please consider follow up with your Primary Care Provider.   ________________________________________________________  The  [facility] providers would like to encourage you to use [facility] to communicate with providers for non-urgent requests or questions.  Due to long hold times on the telephone, sending your provider a message by [facility] may be a faster and more efficient way to get a response.  Please allow 48 business hours for a response.  Please remember that this is for non-urgent requests.  _______________________________________________________

## 2023-02-02 ENCOUNTER — Telehealth: Payer: Self-pay | Admitting: Physician Assistant

## 2023-02-02 NOTE — Telephone Encounter (Signed)
See results note dated 2/19

## 2023-02-02 NOTE — Telephone Encounter (Signed)
Left message on machine to call back   See below     Please call the patient/wife and let them know that his inflammatory markers are quite elevated, these are nonspecific at this time but do indicate an active inflammatory process. Also TSH is low-indicating that he does have evidence of overactive thyroid certainly may be contributing to his weight loss I think they have an appointment with endocrinology, please find out In the meantime his PCP will need copies of these labs and I think he needs further workup of his thyroid and probably parathyroid additional labs. For now lets keep him scheduled for the CT abdomen and pelvis, and the EGD and colonoscopy.

## 2023-02-02 NOTE — Telephone Encounter (Signed)
Patient's wife is calling checking up on patient's thyroid results. Please advise

## 2023-02-03 DIAGNOSIS — J3489 Other specified disorders of nose and nasal sinuses: Secondary | ICD-10-CM | POA: Diagnosis not present

## 2023-02-03 DIAGNOSIS — H903 Sensorineural hearing loss, bilateral: Secondary | ICD-10-CM | POA: Diagnosis not present

## 2023-02-04 NOTE — Telephone Encounter (Signed)
Shawn [name] pt

## 2023-02-04 NOTE — Telephone Encounter (Signed)
Spoke with the spouse and review the findings with recommendations.

## 2023-02-04 NOTE — Telephone Encounter (Signed)
Patients wife returned call, please advise.

## 2023-02-07 DIAGNOSIS — M2550 Pain in unspecified joint: Secondary | ICD-10-CM | POA: Diagnosis not present

## 2023-02-07 DIAGNOSIS — K922 Gastrointestinal hemorrhage, unspecified: Secondary | ICD-10-CM | POA: Diagnosis not present

## 2023-02-07 DIAGNOSIS — I10 Essential (primary) hypertension: Secondary | ICD-10-CM | POA: Diagnosis not present

## 2023-02-11 ENCOUNTER — Ambulatory Visit (HOSPITAL_COMMUNITY)
Admission: RE | Admit: 2023-02-11 | Discharge: 2023-02-11 | Disposition: A | Discharge status: Home / Self Care | Payer: BC Managed Care – PPO | Source: Ambulatory Visit | Attending: Physician Assistant | Admitting: Physician Assistant

## 2023-02-11 ENCOUNTER — Encounter (HOSPITAL_COMMUNITY): Payer: Self-pay

## 2023-02-11 DIAGNOSIS — R195 Other fecal abnormalities: Secondary | ICD-10-CM | POA: Diagnosis not present

## 2023-02-11 DIAGNOSIS — N281 Cyst of kidney, acquired: Secondary | ICD-10-CM | POA: Diagnosis not present

## 2023-02-11 DIAGNOSIS — R634 Abnormal weight loss: Secondary | ICD-10-CM | POA: Diagnosis not present

## 2023-02-11 MED ORDER — IOHEXOL 300 MG/ML  SOLN
100.0000 mL | Freq: Once | INTRAMUSCULAR | Status: AC | PRN
  Administered 2023-02-11: 100 mL via INTRAVENOUS

## 2023-02-11 MED ORDER — SODIUM CHLORIDE (PF) 0.9 % IJ SOLN
INTRAMUSCULAR | Status: AC
  Filled 2023-02-11: qty 50

## 2023-02-13 ENCOUNTER — Encounter: Payer: Self-pay | Admitting: Gastroenterology

## 2023-02-16 ENCOUNTER — Telehealth: Payer: Self-pay | Admitting: Hematology and Oncology

## 2023-02-16 ENCOUNTER — Other Ambulatory Visit: Payer: Self-pay | Admitting: Hematology and Oncology

## 2023-02-16 DIAGNOSIS — D472 Monoclonal gammopathy: Secondary | ICD-10-CM

## 2023-02-16 NOTE — Telephone Encounter (Signed)
Scheduled appointment per staff message. Talked with the patients wife and she is aware of the made appointments for her husband.

## 2023-02-17 ENCOUNTER — Telehealth: Payer: Self-pay

## 2023-02-17 DIAGNOSIS — H9313 Tinnitus, bilateral: Secondary | ICD-10-CM | POA: Diagnosis not present

## 2023-02-17 DIAGNOSIS — J3489 Other specified disorders of nose and nasal sinuses: Secondary | ICD-10-CM | POA: Diagnosis not present

## 2023-02-17 DIAGNOSIS — H9122 Sudden idiopathic hearing loss, left ear: Secondary | ICD-10-CM | POA: Diagnosis not present

## 2023-02-17 NOTE — Telephone Encounter (Signed)
Spoke with pt's wife who states pt has lost his hearing as of 3 weeks ago. She is calling to report petechiae to BLE. She reports BLE and sometimes his feet are weeping. Advised her to call pt's PCP for visit as this could be related to many things. She verbalized agreement. She will bring pt 03/16/23 for labs and phone visit 4/15. She knows to call with any further concerns.

## 2023-02-22 DIAGNOSIS — D509 Iron deficiency anemia, unspecified: Secondary | ICD-10-CM | POA: Diagnosis not present

## 2023-02-22 DIAGNOSIS — R760 Raised antibody titer: Secondary | ICD-10-CM | POA: Diagnosis not present

## 2023-02-23 ENCOUNTER — Ambulatory Visit (AMBULATORY_SURGERY_CENTER): Payer: BC Managed Care – PPO | Admitting: Gastroenterology

## 2023-02-23 ENCOUNTER — Encounter: Payer: Self-pay | Admitting: Gastroenterology

## 2023-02-23 VITALS — BP 103/67 | HR 70 | Temp 98.6°F | Resp 18 | Ht 73.0 in | Wt 190.0 lb

## 2023-02-23 DIAGNOSIS — D123 Benign neoplasm of transverse colon: Secondary | ICD-10-CM | POA: Diagnosis not present

## 2023-02-23 DIAGNOSIS — R195 Other fecal abnormalities: Secondary | ICD-10-CM | POA: Diagnosis not present

## 2023-02-23 DIAGNOSIS — K2950 Unspecified chronic gastritis without bleeding: Secondary | ICD-10-CM | POA: Diagnosis not present

## 2023-02-23 DIAGNOSIS — K635 Polyp of colon: Secondary | ICD-10-CM | POA: Diagnosis not present

## 2023-02-23 DIAGNOSIS — K2100 Gastro-esophageal reflux disease with esophagitis, without bleeding: Secondary | ICD-10-CM | POA: Diagnosis not present

## 2023-02-23 DIAGNOSIS — K2970 Gastritis, unspecified, without bleeding: Secondary | ICD-10-CM

## 2023-02-23 DIAGNOSIS — K921 Melena: Secondary | ICD-10-CM | POA: Diagnosis not present

## 2023-02-23 DIAGNOSIS — K2980 Duodenitis without bleeding: Secondary | ICD-10-CM

## 2023-02-23 DIAGNOSIS — K209 Esophagitis, unspecified without bleeding: Secondary | ICD-10-CM

## 2023-02-23 DIAGNOSIS — R634 Abnormal weight loss: Secondary | ICD-10-CM

## 2023-02-23 DIAGNOSIS — K229 Disease of esophagus, unspecified: Secondary | ICD-10-CM | POA: Diagnosis not present

## 2023-02-23 HISTORY — PX: COLONOSCOPY WITH ESOPHAGOGASTRODUODENOSCOPY (EGD): SHX5779

## 2023-02-23 MED ORDER — SODIUM CHLORIDE 0.9 % IV SOLN: 500.0000 mL | Freq: Once | INTRAVENOUS | Status: DC

## 2023-02-23 MED ORDER — PANTOPRAZOLE SODIUM 40 MG PO TBEC: 40.0000 mg | DELAYED_RELEASE_TABLET | Freq: Two times a day (BID) | ORAL | 1 refills | Status: DC

## 2023-02-23 NOTE — Op Note (Signed)
[place] Patient Name: Shawn [name] Procedure Date: 02/23/2023 3:29 PM MRN: [id number] Endoscopist: Mauri Pole , MD, [id number] Age: 62 Referring MD:  Date of Birth: [birth year] Gender: Male Account #: [id number] Procedure:                Upper GI endoscopy Indications:              Suspected upper gastrointestinal bleeding,                            Gastrointestinal bleeding of unknown origin, Weight                            loss Medicines:                Monitored Anesthesia Care Procedure:                Pre-Anesthesia Assessment:                           - Prior to the procedure, a History and Physical                            was performed, and patient medications and                            allergies were reviewed. The patient's tolerance of                            previous anesthesia was also reviewed. The risks                            and benefits of the procedure and the sedation                            options and risks were discussed with the patient.                            All questions were answered, and informed consent                            was obtained. Prior Anticoagulants: The patient has                            taken no anticoagulant or antiplatelet agents. ASA                            Grade Assessment: II - A patient with mild systemic                            disease. After reviewing the risks and benefits,                            the patient was deemed in satisfactory condition to  undergo the procedure.                           After obtaining informed consent, the endoscope was                            passed under direct vision. Throughout the                            procedure, the patient's blood pressure, pulse, and                            oxygen saturations were monitored continuously. The                            Olympus scope [id number] was introduced through the                             mouth, and advanced to the second part of duodenum.                            The upper GI endoscopy was accomplished without                            difficulty. The patient tolerated the procedure                            well. Scope In: Scope Out: Findings:                 LA Grade B (one or more mucosal breaks greater than                            5 mm, not extending between the tops of two mucosal                            folds) esophagitis with no bleeding was found 36 to                            38 cm from the incisors. Biopsies were taken with a                            cold forceps for histology.                           A 3 cm hiatal hernia was present.                           Patchy mild inflammation characterized by                            congestion (edema), erythema and friability was                            found in  the entire examined stomach. Biopsies were                            taken with a cold forceps for Helicobacter pylori                            testing.                           The cardia and gastric fundus were normal on                            retroflexion.                           Patchy mildly erythematous mucosa without active                            bleeding and with no stigmata of bleeding was found                            in the first portion of the duodenum and in the                            second portion of the duodenum. Biopsies were taken                            with a cold forceps for histology. Complications:            No immediate complications. Estimated Blood Loss:     Estimated blood loss was minimal. Impression:               - LA Grade B reflux esophagitis with no bleeding.                            Biopsied.                           - 3 cm hiatal hernia.                           - Gastritis. Biopsied.                           - Erythematous duodenopathy.  Biopsied. Recommendation:           - Resume previous diet.                           - Continue present medications.                           - Await pathology results.                           - Follow an antireflux regimen.                           -  Use Protonix (pantoprazole) 40 mg PO BID. Rx for                            90 days with 1 refill                           - Return to GI office in 3 months. Mauri Pole, MD 02/23/2023 4:11:03 PM This report has been signed electronically.

## 2023-02-23 NOTE — Progress Notes (Signed)
Pt's states no medical or surgical changes since previsit or office visit. 

## 2023-02-23 NOTE — Progress Notes (Signed)
Called to room to assist during endoscopic procedure.  Patient ID and intended procedure confirmed with present staff. Received instructions for my participation in the procedure from the performing physician.  

## 2023-02-23 NOTE — Progress Notes (Unsigned)
To pacu, VSS. Report to [email or web address] 

## 2023-02-23 NOTE — Patient Instructions (Addendum)
Resume previous diet Continue present medications, Begin taking Protonix(pantoprazole) twice daily for 90 days Office visit with Dr Silverio Decamp scheduled for 05/26/23, 10:10 AM Await pathology results Handout/information given for anti-reflux(GERD), duodenitis, esophagitis, gastritis, polyps, diverticulosis and hemorrhoids.  YOU HAD AN ENDOSCOPIC PROCEDURE TODAY AT [facility]:   Refer to the procedure report that was given to you for any specific questions about what was found during the examination.  If the procedure report does not answer your questions, please call your gastroenterologist to clarify.  If you requested that your care partner not be given the details of your procedure findings, then the procedure report has been included in a sealed envelope for you to review at your convenience later.  YOU SHOULD EXPECT: Some feelings of bloating in the abdomen. Passage of more gas than usual.  Walking can help get rid of the air that was put into your GI tract during the procedure and reduce the bloating. If you had a lower endoscopy (such as a colonoscopy or flexible sigmoidoscopy) you may notice spotting of blood in your stool or on the toilet paper. If you underwent a bowel prep for your procedure, you may not have a normal bowel movement for a few days.  Please Note:  You might notice some irritation and congestion in your nose or some drainage.  This is from the oxygen used during your procedure.  There is no need for concern and it should clear up in a day or so.  SYMPTOMS TO REPORT IMMEDIATELY:  Following lower endoscopy (colonoscopy):  Excessive amounts of blood in the stool  Significant tenderness or worsening of abdominal pains  Swelling of the abdomen that is new, acute  Fever of 100F or higher Following upper endoscopy (EGD)  Vomiting of blood or coffee ground material  New chest pain or pain under the shoulder blades  Painful or persistently difficult  swallowing  New shortness of breath  Black, tarry-looking stools  For urgent or emergent issues, a gastroenterologist can be reached at any hour by calling [phone]. Do not use MyChart messaging for urgent concerns.   DIET:  We do recommend a small meal at first, but then you may proceed to your regular diet.  Drink plenty of fluids but you should avoid alcoholic beverages for 24 hours.  ACTIVITY:  You should plan to take it easy for the rest of today and you should NOT DRIVE or use heavy machinery until tomorrow (because of the sedation medicines used during the test).    FOLLOW UP: Our staff will call the number listed on your records the next business day following your procedure.  We will call around 7:15- 8:00 am to check on you and address any questions or concerns that you may have regarding the information given to you following your procedure. If we do not reach you, we will leave a message.     If any biopsies were taken you will be contacted by phone or by letter within the next 1-3 weeks.  Please call us at [phone] if you have not heard about the biopsies in 3 weeks.    SIGNATURES/CONFIDENTIALITY: You and/or your care partner have signed paperwork which will be entered into your electronic medical record.  These signatures attest to the fact that that the information above on your After Visit Summary has been reviewed and is understood.  Full responsibility of the confidentiality of this discharge information lies with you and/or your care-partner.

## 2023-02-23 NOTE — Op Note (Signed)
[place] Patient Name: Shawn [name] Procedure Date: 02/23/2023 3:25 PM MRN: [id number] Endoscopist: Mauri Pole , MD, [id number] Age: 62 Referring MD:  Date of Birth: 1961/02/05 Gender: Male Account #: [id number] Procedure:                Colonoscopy Indications:              Evaluation of unexplained GI bleeding presenting                            with Melena (upper GI source has been excluded),                            Evaluation of unexplained GI bleeding presenting                            with fecal occult blood, Weight loss Medicines:                Monitored Anesthesia Care Procedure:                Pre-Anesthesia Assessment:                           - Prior to the procedure, a History and Physical                            was performed, and patient medications and                            allergies were reviewed. The patient's tolerance of                            previous anesthesia was also reviewed. The risks                            and benefits of the procedure and the sedation                            options and risks were discussed with the patient.                            All questions were answered, and informed consent                            was obtained. Prior Anticoagulants: The patient has                            taken no anticoagulant or antiplatelet agents. ASA                            Grade Assessment: II - A patient with mild systemic                            disease. After reviewing the risks and benefits,  the patient was deemed in satisfactory condition to                            undergo the procedure.                           After obtaining informed consent, the colonoscope                            was passed under direct vision. Throughout the                            procedure, the patient's blood pressure, pulse, and                            oxygen saturations  were monitored continuously. The                            Olympus [id number]) Colonoscope was                            introduced through the anus and advanced to the the                            cecum, identified by appendiceal orifice and                            ileocecal valve. The colonoscopy was performed                            without difficulty. The patient tolerated the                            procedure well. The quality of the bowel                            preparation was good. The ileocecal valve,                            appendiceal orifice, and rectum were photographed. Scope In: 3:45:33 PM Scope Out: 4:00:06 PM Scope Withdrawal Time: 0 hours 9 minutes 58 seconds  Total Procedure Duration: 0 hours 14 minutes 33 seconds  Findings:                 The perianal and digital rectal examinations were                            normal.                           A 5 mm polyp was found in the transverse colon. The                            polyp was sessile. The polyp was removed with a  cold snare. Resection and retrieval were complete.                           Scattered large-mouthed, medium-mouthed and                            small-mouthed diverticula were found in the sigmoid                            colon and descending colon.                           Non-bleeding external and internal hemorrhoids were                            found during retroflexion. The hemorrhoids were                            small. Complications:            No immediate complications. Estimated Blood Loss:     Estimated blood loss was minimal. Impression:               - One 5 mm polyp in the transverse colon, removed                            with a cold snare. Resected and retrieved.                           - Diverticulosis in the sigmoid colon and in the                            descending colon.                           -  Non-bleeding external and internal hemorrhoids. Recommendation:           - Patient has a contact number available for                            emergencies. The signs and symptoms of potential                            delayed complications were discussed with the                            patient. Return to normal activities tomorrow.                            Written discharge instructions were provided to the                            patient.                           - Resume previous diet.                           -  Continue present medications.                           - Await pathology results.                           - Repeat colonoscopy in 5-10 years for surveillance                            based on pathology results. Mauri Pole, MD 02/23/2023 4:07:26 PM This report has been signed electronically.

## 2023-02-23 NOTE — Progress Notes (Unsigned)
Please refer to office visit note 01/30/23 by Nicoletta Ba. No additional changes in H&P Patient is appropriate for planned procedure(s) and anesthesia in an ambulatory setting  K. Denzil Magnuson , MD [phone]

## 2023-02-24 ENCOUNTER — Telehealth: Payer: Self-pay | Admitting: *Deleted

## 2023-02-24 ENCOUNTER — Encounter: Payer: Self-pay | Admitting: Gastroenterology

## 2023-02-24 NOTE — Telephone Encounter (Signed)
Post procedure follow up phone call. No answer at number given.  Left message on voicemail.  

## 2023-02-25 DIAGNOSIS — H9122 Sudden idiopathic hearing loss, left ear: Secondary | ICD-10-CM | POA: Diagnosis not present

## 2023-02-25 DIAGNOSIS — H9313 Tinnitus, bilateral: Secondary | ICD-10-CM | POA: Diagnosis not present

## 2023-02-25 DIAGNOSIS — J3489 Other specified disorders of nose and nasal sinuses: Secondary | ICD-10-CM | POA: Diagnosis not present

## 2023-02-27 ENCOUNTER — Telehealth: Payer: Self-pay | Admitting: Gastroenterology

## 2023-02-27 NOTE — Telephone Encounter (Signed)
Patient's wife called, stating PCP is wanting to prescribe patient iron medication. They would like to have Dr. [name] approval prior to beginning medication.

## 2023-02-27 NOTE — Telephone Encounter (Signed)
Patient returning for follow up in June. He had EGD and colonoscopy 02/23/23. Is there a GI reason he should not take iron?

## 2023-03-03 DIAGNOSIS — H9313 Tinnitus, bilateral: Secondary | ICD-10-CM | POA: Diagnosis not present

## 2023-03-03 DIAGNOSIS — J3489 Other specified disorders of nose and nasal sinuses: Secondary | ICD-10-CM | POA: Diagnosis not present

## 2023-03-03 DIAGNOSIS — H9122 Sudden idiopathic hearing loss, left ear: Secondary | ICD-10-CM | POA: Diagnosis not present

## 2023-03-03 NOTE — Telephone Encounter (Signed)
Patient's wife following up on note below

## 2023-03-04 ENCOUNTER — Telehealth: Payer: Self-pay

## 2023-03-04 NOTE — Telephone Encounter (Signed)
PA request received via CMM for Pantoprazole Sodium 40MG  dr tablets  PA submitted to [facility] via CMM and is pending determination.  Key: [id number]

## 2023-03-06 NOTE — Telephone Encounter (Signed)
Dr Silverio Decamp This patient has been on Protonix twice a day but now his insurance will not cover. He can try Omeprazole or Nexium.

## 2023-03-06 NOTE — Telephone Encounter (Signed)
Pharmacy Patient Advocate Encounter  Received notification from [facility] that the request for prior authorization for PANTOPRAZOLE has been denied due to

## 2023-03-07 DIAGNOSIS — E049 Nontoxic goiter, unspecified: Secondary | ICD-10-CM | POA: Diagnosis not present

## 2023-03-07 DIAGNOSIS — I10 Essential (primary) hypertension: Secondary | ICD-10-CM | POA: Diagnosis not present

## 2023-03-09 NOTE — Telephone Encounter (Signed)
Okay to switch to omeprazole 40 mg twice daily.  Thank you

## 2023-03-09 NOTE — Telephone Encounter (Signed)
I do not see any particular contraindication based on review of his labs, he has mild anemia but I cannot see any recent iron panel.  If patient is worried about iron overload, we can check iron panel to confirm if he truly has iron deficiency or not.

## 2023-03-10 ENCOUNTER — Encounter: Payer: Self-pay | Admitting: Gastroenterology

## 2023-03-10 MED ORDER — OMEPRAZOLE 40 MG PO CPDR: 40.0000 mg | DELAYED_RELEASE_CAPSULE | Freq: Two times a day (BID) | ORAL | 3 refills | Status: DC

## 2023-03-10 NOTE — Addendum Note (Signed)
Addended by: Oda Kilts on: 03/10/2023 01:51 PM   Modules accepted: Orders

## 2023-03-10 NOTE — Telephone Encounter (Signed)
We changed him to Omeprazole 40 mg twice a day

## 2023-03-11 ENCOUNTER — Other Ambulatory Visit: Payer: BC Managed Care – PPO

## 2023-03-12 ENCOUNTER — Other Ambulatory Visit: Payer: BC Managed Care – PPO

## 2023-03-12 ENCOUNTER — Telehealth: Payer: Self-pay | Admitting: *Deleted

## 2023-03-12 DIAGNOSIS — E349 Endocrine disorder, unspecified: Secondary | ICD-10-CM

## 2023-03-12 NOTE — Telephone Encounter (Signed)
Received call from pt spouse stating pt is experiencing red/ purple dots on bilateral legs, ankle, and feet.  States areas are tender to touch and do open up and weep a clear fluid. Requesting advice from MD.  Notified pt wife that MD is out of the office and pt would need to make an appt with PCP for further evaluation and tx.  Verbalized understanding.

## 2023-03-13 LAB — TESTOSTERONE: Testosterone: 1151 ng/dL — ABNORMAL HIGH (ref 264–916)

## 2023-03-14 DIAGNOSIS — R760 Raised antibody titer: Secondary | ICD-10-CM | POA: Diagnosis not present

## 2023-03-14 DIAGNOSIS — L959 Vasculitis limited to the skin, unspecified: Secondary | ICD-10-CM | POA: Diagnosis not present

## 2023-03-16 ENCOUNTER — Encounter: Payer: Self-pay | Admitting: *Deleted

## 2023-03-16 ENCOUNTER — Inpatient Hospital Stay: Payer: BC Managed Care – PPO | Attending: Nurse Practitioner

## 2023-03-16 ENCOUNTER — Other Ambulatory Visit: Payer: Self-pay

## 2023-03-16 DIAGNOSIS — R200 Anesthesia of skin: Secondary | ICD-10-CM | POA: Diagnosis not present

## 2023-03-16 DIAGNOSIS — D472 Monoclonal gammopathy: Secondary | ICD-10-CM | POA: Diagnosis not present

## 2023-03-16 DIAGNOSIS — Z79899 Other long term (current) drug therapy: Secondary | ICD-10-CM | POA: Diagnosis not present

## 2023-03-16 DIAGNOSIS — R21 Rash and other nonspecific skin eruption: Secondary | ICD-10-CM | POA: Insufficient documentation

## 2023-03-16 LAB — CBC WITH DIFFERENTIAL (CANCER CENTER ONLY)
Abs Immature Granulocytes: 0.05 10*3/uL (ref 0.00–0.07)
Basophils Absolute: 0.1 10*3/uL (ref 0.0–0.1)
Basophils Relative: 1 %
Eosinophils Absolute: 0.3 10*3/uL (ref 0.0–0.5)
Eosinophils Relative: 3 %
HCT: 38 % — ABNORMAL LOW (ref 39.0–52.0)
Hemoglobin: 12.4 g/dL — ABNORMAL LOW (ref 13.0–17.0)
Immature Granulocytes: 1 %
Lymphocytes Relative: 15 %
Lymphs Abs: 1.3 10*3/uL (ref 0.7–4.0)
MCH: 29.7 pg (ref 26.0–34.0)
MCHC: 32.6 g/dL (ref 30.0–36.0)
MCV: 90.9 fL (ref 80.0–100.0)
Monocytes Absolute: 0.6 10*3/uL (ref 0.1–1.0)
Monocytes Relative: 6 %
Neutro Abs: 7 10*3/uL (ref 1.7–7.7)
Neutrophils Relative %: 74 %
Platelet Count: 339 10*3/uL (ref 150–400)
RBC: 4.18 MIL/uL — ABNORMAL LOW (ref 4.22–5.81)
RDW: 14.2 % (ref 11.5–15.5)
WBC Count: 9.3 10*3/uL (ref 4.0–10.5)
nRBC: 0 % (ref 0.0–0.2)

## 2023-03-16 LAB — LACTATE DEHYDROGENASE: LDH: 122 U/L (ref 98–192)

## 2023-03-16 LAB — CMP (CANCER CENTER ONLY)
ALT: 13 U/L (ref 0–44)
AST: 14 U/L — ABNORMAL LOW (ref 15–41)
Albumin: 4.2 g/dL (ref 3.5–5.0)
Alkaline Phosphatase: 52 U/L (ref 38–126)
Anion gap: 5 (ref 5–15)
BUN: 10 mg/dL (ref 8–23)
CO2: 30 mmol/L (ref 22–32)
Calcium: 9.4 mg/dL (ref 8.9–10.3)
Chloride: 104 mmol/L (ref 98–111)
Creatinine: 0.83 mg/dL (ref 0.61–1.24)
GFR, Estimated: >60 mL/min (ref >60)
Glucose, Bld: 96 mg/dL (ref 70–99)
Potassium: 4 mmol/L (ref 3.5–5.1)
Sodium: 139 mmol/L (ref 135–145)
Total Bilirubin: 0.5 mg/dL (ref 0.3–1.2)
Total Protein: 7.7 g/dL (ref 6.5–8.1)

## 2023-03-17 DIAGNOSIS — R946 Abnormal results of thyroid function studies: Secondary | ICD-10-CM | POA: Diagnosis not present

## 2023-03-17 LAB — KAPPA/LAMBDA LIGHT CHAINS
Kappa free light chain: 18.9 mg/L (ref 3.3–19.4)
Kappa, lambda light chain ratio: 0.81 (ref 0.26–1.65)
Lambda free light chains: 23.4 mg/L (ref 5.7–26.3)

## 2023-03-18 DIAGNOSIS — R946 Abnormal results of thyroid function studies: Secondary | ICD-10-CM | POA: Diagnosis not present

## 2023-03-19 LAB — MULTIPLE MYELOMA PANEL, SERUM
Albumin SerPl Elph-Mcnc: 3.7 g/dL (ref 2.9–4.4)
Albumin/Glob SerPl: 1.2 (ref 0.7–1.7)
Alpha 1: 0.3 g/dL (ref 0.0–0.4)
Alpha2 Glob SerPl Elph-Mcnc: 0.7 g/dL (ref 0.4–1.0)
B-Globulin SerPl Elph-Mcnc: 0.8 g/dL (ref 0.7–1.3)
Gamma Glob SerPl Elph-Mcnc: 1.4 g/dL (ref 0.4–1.8)
Globulin, Total: 3.2 g/dL (ref 2.2–3.9)
IgA: 136 mg/dL (ref 61–437)
IgG (Immunoglobin G), Serum: 1623 mg/dL — ABNORMAL HIGH (ref 603–1613)
IgM (Immunoglobulin M), Srm: 30 mg/dL (ref 20–172)
M Protein SerPl Elph-Mcnc: 0.7 g/dL — ABNORMAL HIGH
Total Protein ELP: 6.9 g/dL (ref 6.0–8.5)

## 2023-03-23 ENCOUNTER — Telehealth: Payer: Self-pay | Admitting: Internal Medicine

## 2023-03-23 NOTE — Telephone Encounter (Signed)
Patient c/o Palpitations:  High priority if patient c/o lightheadedness, shortness of breath, or chest pain  How long have you had palpitations/irregular HR/ Afib? Are you having the symptoms now? Started last night, is still having symptoms now.   Are you currently experiencing lightheadedness, SOB or CP? No  Do you have a history of afib (atrial fibrillation) or irregular heart rhythm? Yes  Have you checked your BP or HR? (document readings if available): BP 121/63 HR 91  Are you experiencing any other symptoms? Dizziness & nausea   Wife is calling for pt to report he began having an erratic heartbeat around 9:30 pm last night. He also c/o dizziness and nausea. Due to the symptoms he took two 81 mg aspirin that took the anxiety caused from having the symptoms away. She reports he is still having the erratic heartbeat at this time, but denies any lightheadedness, SOB, or CP. Please advise.

## 2023-03-23 NOTE — Telephone Encounter (Signed)
Spoke with pt spouse in regards to funny heart beats.  Reports started last night.  Pt felt heart fluttering, dizziness and jittery.  BP last night 121/63-91. Pt has SOB.  Has an infection in nose is currently using Bactroban for infection in nose. Also is having some issues with thyroid. Advised issues with thyroid could contribute to symptoms.   Scheduled OV for 03/25/23 advised spouse to have pt go to the ED if symptoms persist and/or are bothersome.  She expresses understanding.

## 2023-03-24 NOTE — Telephone Encounter (Signed)
Left a message to call back.   Need to advise spouse that MD would like pt to wear a heart monitor.  OV for 03/25/23 rescheduled to 04/27/23 d/t MD schedule change.

## 2023-03-24 NOTE — Telephone Encounter (Signed)
Patient's wife is returning call. Transferred to [facility], Charity fundraiser.

## 2023-03-24 NOTE — Telephone Encounter (Signed)
Spoke with pt spouse. She is aware of schedule change. Reports pt feels better today and wants to see how he does this week.  Will call back and or go to ED if status changes.   Refusing heart monitor suggested by MD at this time. All questions answered.

## 2023-03-25 ENCOUNTER — Ambulatory Visit: Payer: BC Managed Care – PPO | Admitting: Internal Medicine

## 2023-03-25 ENCOUNTER — Ambulatory Visit: Payer: BC Managed Care – PPO | Admitting: Physician Assistant

## 2023-03-25 DIAGNOSIS — L817 Pigmented purpuric dermatosis: Secondary | ICD-10-CM | POA: Diagnosis not present

## 2023-03-27 NOTE — Progress Notes (Signed)
Patient Care Team: Penelope Galas, MD as PCP - General (Internal Medicine) Christell Constant, MD as PCP - Cardiology (Cardiology)  DIAGNOSIS: No diagnosis found.  SUMMARY OF ONCOLOGIC HISTORY: Oncology History   No history exists.    CHIEF COMPLIANT: monoclonal protein  INTERVAL HISTORY: Shawn [name] is a 62 y.o. male with above-mentioned history of monoclonal protein. He presents to the clinic for a follow-up.    ALLERGIES:  has No Known Allergies.  MEDICATIONS:  Current Outpatient Medications  Medication Sig Dispense Refill   celecoxib (CELEBREX) 200 MG capsule Take 200 mg by mouth 2 (two) times daily. (Patient not taking: Reported on 02/23/2023)     gabapentin (NEURONTIN) 300 MG capsule Take by mouth. Take 1 tab in the morning and 2 tabs in the evening     lisinopril (ZESTRIL) 20 MG tablet Take 1 tablet (20 mg total) by mouth every morning. 90 tablet 3   metoprolol succinate (TOPROL-XL) 50 MG 24 hr tablet Take 1 tablet (50 mg total) by mouth daily. 90 tablet 3   mupirocin ointment (BACTROBAN) 2 % 1 Application 2 (two) times daily.     NEEDLE, DISP, 18 G 18G X 1" MISC Use as directed 50 each 0   omeprazole (PRILOSEC) 40 MG capsule Take 1 capsule (40 mg total) by mouth in the morning and at bedtime. 60 capsule 3   SYRINGE-NEEDLE, DISP, 3 ML (B-D 3CC LUER-LOK SYR 21GX1-1/2) 21G X 1-1/2" 3 ML MISC USE AS DIRECTED FOR TESTOSTERONE ADMINISTRATION 38 each 1   tadalafil (CIALIS) 5 MG tablet TAKE 1 TABLET BY MOUTH DAILY AS NEEDED FOR ERECTILE DYSFUNCTION. 30 tablet 0   testosterone cypionate (DEPOTESTOSTERONE CYPIONATE) 200 MG/ML injection Inject 1.5 mLs (300 mg total) into the muscle every 14 (fourteen) days. 10 mL 0   No current facility-administered medications for this visit.    PHYSICAL EXAMINATION: ECOG PERFORMANCE STATUS: {CHL ONC ECOG [id number]}  There were no vitals filed for this visit. There were no vitals filed for this visit.  BREAST:*** No palpable  masses or nodules in either right or left breasts. No palpable axillary supraclavicular or infraclavicular adenopathy no breast tenderness or nipple discharge. (exam performed in the presence of a chaperone)  LABORATORY DATA:  I have reviewed the data as listed    Latest Ref Rng & Units 03/16/2023   11:34 AM 01/18/2023    7:56 AM 01/27/2022    4:54 PM  CMP  Glucose 70 - 99 mg/dL 96  754  80   BUN 8 - 23 mg/dL 10  11  16    Creatinine 0.61 - 1.24 mg/dL 4.92  0.10  0.71   Sodium 135 - 145 mmol/L 139  136  137   Potassium 3.5 - 5.1 mmol/L 4.0  4.5  4.7   Chloride 98 - 111 mmol/L 104  103  101   CO2 22 - 32 mmol/L 30  27  23    Calcium 8.9 - 10.3 mg/dL 9.4  8.5  9.4   Total Protein 6.5 - 8.1 g/dL 7.7  7.0  7.8   Total Bilirubin 0.3 - 1.2 mg/dL 0.5  1.1  0.3   Alkaline Phos 38 - 126 U/L 52  46  77   AST 15 - 41 U/L 14  18  17    ALT 0 - 44 U/L 13  12  13      Lab Results  Component Value Date   WBC 9.3 03/16/2023   HGB 12.4 (L) 03/16/2023  HCT 38.0 (L) 03/16/2023   MCV 90.9 03/16/2023   PLT 339 03/16/2023   NEUTROABS 7.0 03/16/2023    ASSESSMENT & PLAN:  No problem-specific Assessment & Plan notes found for this encounter.    No orders of the defined types were placed in this encounter.  The patient has a good understanding of the overall plan. he agrees with it. he will call with any problems that may develop before the next visit here. Total time spent: 30 mins including face to face time and time spent for planning, charting and co-ordination of care   Sherlyn Lick, CMA 03/27/23    I Janan Ridge am acting as a Neurosurgeon for [employer]  ***

## 2023-03-30 ENCOUNTER — Inpatient Hospital Stay (HOSPITAL_BASED_OUTPATIENT_CLINIC_OR_DEPARTMENT_OTHER): Payer: BC Managed Care – PPO | Admitting: Hematology and Oncology

## 2023-03-30 ENCOUNTER — Other Ambulatory Visit: Payer: Self-pay

## 2023-03-30 VITALS — BP 126/76 | HR 77 | Temp 97.5°F | Resp 18 | Ht 73.0 in | Wt 201.8 lb

## 2023-03-30 DIAGNOSIS — D472 Monoclonal gammopathy: Secondary | ICD-10-CM | POA: Diagnosis not present

## 2023-03-30 DIAGNOSIS — Z79899 Other long term (current) drug therapy: Secondary | ICD-10-CM | POA: Diagnosis not present

## 2023-03-30 DIAGNOSIS — R21 Rash and other nonspecific skin eruption: Secondary | ICD-10-CM | POA: Diagnosis not present

## 2023-03-30 DIAGNOSIS — R200 Anesthesia of skin: Secondary | ICD-10-CM | POA: Diagnosis not present

## 2023-03-30 NOTE — Assessment & Plan Note (Signed)
Lab review 01/27/2022: Hemoglobin 11.6, creatinine 0.89, calcium 9.4, B12 474, ANA negative, SPEP: 1.1 g IgG lambda monoclonal protein 02/07/22: Kappa: 23, Lambda 17, ratio: 1.35, Beta 2 microglobulin: 1.9 03/16/2023: Hemoglobin 12.4, M protein: 0.7 g IgG lambda, IgG 1623, kappa 18.9, lambda 23.4, ratio 0.81, creatinine 0.83, albumin 4.2, calcium 9.4  I reviewed the lab results and discussed that the M protein in fact has come down.  Therefore there is no concern for myeloma at this time. Acute on chronic back pain: Possibly due to spinal stenosis/arthritis. Bone Survey: 02/17/2022: Benign  RTC in 1 year with labs done ahead of the visit

## 2023-03-31 ENCOUNTER — Other Ambulatory Visit: Payer: BLUE CROSS/BLUE SHIELD

## 2023-03-31 ENCOUNTER — Other Ambulatory Visit: Payer: Self-pay | Admitting: Urology

## 2023-03-31 NOTE — Progress Notes (Signed)
Reviewed and agree with documentation and assessment and plan. K. Veena Roderick Sweezy , MD   

## 2023-04-08 DIAGNOSIS — J3489 Other specified disorders of nose and nasal sinuses: Secondary | ICD-10-CM | POA: Diagnosis not present

## 2023-04-08 DIAGNOSIS — H903 Sensorineural hearing loss, bilateral: Secondary | ICD-10-CM | POA: Diagnosis not present

## 2023-04-08 DIAGNOSIS — H9313 Tinnitus, bilateral: Secondary | ICD-10-CM | POA: Diagnosis not present

## 2023-04-21 DIAGNOSIS — H9122 Sudden idiopathic hearing loss, left ear: Secondary | ICD-10-CM | POA: Diagnosis not present

## 2023-04-21 DIAGNOSIS — H903 Sensorineural hearing loss, bilateral: Secondary | ICD-10-CM | POA: Diagnosis not present

## 2023-04-21 DIAGNOSIS — H8101 Meniere's disease, right ear: Secondary | ICD-10-CM | POA: Diagnosis not present

## 2023-04-21 DIAGNOSIS — Z01818 Encounter for other preprocedural examination: Secondary | ICD-10-CM | POA: Diagnosis not present

## 2023-04-22 DIAGNOSIS — J302 Other seasonal allergic rhinitis: Secondary | ICD-10-CM | POA: Diagnosis not present

## 2023-04-22 DIAGNOSIS — I10 Essential (primary) hypertension: Secondary | ICD-10-CM | POA: Diagnosis not present

## 2023-04-22 DIAGNOSIS — D472 Monoclonal gammopathy: Secondary | ICD-10-CM | POA: Diagnosis not present

## 2023-04-27 ENCOUNTER — Ambulatory Visit: Payer: BC Managed Care – PPO | Attending: Internal Medicine | Admitting: Internal Medicine

## 2023-04-27 ENCOUNTER — Encounter: Payer: Self-pay | Admitting: Internal Medicine

## 2023-04-27 VITALS — BP 118/70 | HR 89 | Ht 73.0 in | Wt 201.0 lb

## 2023-04-27 DIAGNOSIS — I700 Atherosclerosis of aorta: Secondary | ICD-10-CM | POA: Insufficient documentation

## 2023-04-27 DIAGNOSIS — I4719 Other supraventricular tachycardia: Secondary | ICD-10-CM

## 2023-04-27 DIAGNOSIS — I493 Ventricular premature depolarization: Secondary | ICD-10-CM

## 2023-04-27 DIAGNOSIS — L989 Disorder of the skin and subcutaneous tissue, unspecified: Secondary | ICD-10-CM | POA: Diagnosis not present

## 2023-04-27 DIAGNOSIS — L309 Dermatitis, unspecified: Secondary | ICD-10-CM | POA: Diagnosis not present

## 2023-04-27 NOTE — Patient Instructions (Signed)
Medication Instructions:  Your physician recommends that you continue on your current medications as directed. Please refer to the Current Medication list given to you today.  *If you need a refill on your cardiac medications before your next appointment, please call your pharmacy*   Lab Work: NONE If you have labs (blood work) drawn today and your tests are completely normal, you will receive your results only by: MyChart Message (if you have MyChart) OR A paper copy in the mail If you have any lab test that is abnormal or we need to change your treatment, we will call you to review the results.   Testing/Procedures: NONE   Follow-Up: At [facility], you and your health needs are our priority.  As part of our continuing mission to provide you with exceptional heart care, we have created designated Provider Care Teams.  These Care Teams include your primary Cardiologist (physician) and Advanced Practice Providers (APPs -  Physician Assistants and Nurse Practitioners) who all work together to provide you with the care you need, when you need it.   Your next appointment:   1 year(s)  Provider:   Mahesh Chandrasekhar, MD     

## 2023-04-27 NOTE — Progress Notes (Signed)
Cardiology Office Note:    Date:  04/27/2023   ID:  Shawn [name], DOB [birth year], MRN [id number]  PCP:  Shawn Galas, MD   [facility] Providers Cardiologist:  Shawn Constant, MD     Referring MD: Shawn Lund, PA   CC: Follow up AT  History of Present Illness:    Shawn [name] is a 62 y.o. male with a hx of AT NOS, MGUS HTN, who presents 06/12/22. 05-28-2022: he lost weight.  Could not rule out left atrial mass.  Planned for CMR then lost to follow up CMR no evidence of Cardiac Amyloidosis. He is friends with Shawn [name], a patient who I saw briefly who died from AL Amyloidosis. 05-29-2023: No evidence of nephrotic syndrome; Steroid has marginally improved his hearing.  Had a biopsy today for the red macular rash.  He is getting this followed up and is going to see rheumatology for evaluation for vasculitis.  Patient notes that he is doing ok.   No one has been able to figure out what caused  There are no interval hospital/ED visit.    No chest pain or pressure .  No SOB/DOE and no PND/Orthopnea.  No weight gain or leg swelling.  No palpitations or syncope .   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-followed by Shawn [name]   Multiple thyroid nodules    Onychomycosis    treated with lamisil in the past   Other chronic pain    Pain in right hand    Palpitations    Prediabetes    Pruritus    Retinal detachment    right eye caused blindness    Tinea cruris    Tinnitus    Total retinal detachment of right eye     Past Surgical History:  Procedure Laterality Date   APPENDECTOMY     COLONOSCOPY     COLONOSCOPY WITH ESOPHAGOGASTRODUODENOSCOPY (EGD)  02/23/2023   CYSTOSCOPY W/ URETERAL STENT PLACEMENT  08/24/2012   Procedure: CYSTOSCOPY WITH RETROGRADE PYELOGRAM/URETERAL STENT PLACEMENT;  Surgeon: Shawn Ludwig, MD;  Location: [facility];  Service: Urology;  Laterality: Right;   CYSTOSCOPY/RETROGRADE/URETEROSCOPY  08/24/2012   Procedure: CYSTOSCOPY/RETROGRADE/URETEROSCOPY;  Surgeon: Shawn Ludwig, MD;  Location: [facility];  Service: Urology;  Laterality: Right;  Stone Basketing   CYSTOSCOPY/RETROGRADE/URETEROSCOPY/STONE EXTRACTION WITH BASKET Left 04/11/2013   Procedure: CYSTOSCOPY/RETROGRADE/URETEROSCOPY/STONE EXTRACTION WITH BASKET;  Surgeon: Shawn Ludwig, MD;  Location: [facility];  Service: Urology;  Laterality: Left;  BASKET STONE EXTRACTION WITH URETEROSCOPE     Right retina detachment      Current Medications: Current Meds  Medication Sig   cetirizine (ZYRTEC) 10 MG tablet Take 10 mg by mouth daily.   gabapentin (NEURONTIN) 300 MG capsule Take by mouth. Take 1 tab in the morning and 2 tabs in  the evening   lisinopril (ZESTRIL) 20 MG tablet Take 1 tablet (20 mg total) by mouth every morning.   metoprolol succinate (TOPROL-XL) 50 MG 24 hr tablet Take 1 tablet (50 mg total) by mouth daily.   mupirocin ointment (BACTROBAN) 2 % 1 Application 2 (two) times daily.   pantoprazole (PROTONIX) 20 MG tablet Take 20 mg by mouth daily.   SYRINGE-NEEDLE, DISP, 3 ML (B-D 3CC LUER-LOK SYR 21GX1-1/2) 21G X 1-1/2" 3 ML MISC USE AS DIRECTED FOR TESTOSTERONE ADMINISTRATION   testosterone cypionate (DEPOTESTOSTERONE CYPIONATE) 200 MG/ML injection INJECT 1.5 MLS INTO THE MUSCLE EVERY 14 DAYS.    Allergies:   Patient has no known allergies.   Social History   Socioeconomic History   Marital status: Married     Spouse name: Not on file   Number of children: Not on file   Years of education: Not on file   Highest education level: Not on file  Occupational History   Not on file  Tobacco Use   Smoking status: Former    Types: Pipe   Smokeless tobacco: Never  Substance and Sexual Activity   Alcohol use: Yes    Comment: ocassionally beer or wine   Drug use: No   Sexual activity: Never  Other Topics Concern   Not on file  Social History Narrative   Not on file   Social Determinants of Health   Financial Resource Strain: Not on file  Food Insecurity: Not on file  Transportation Needs: Not on file  Physical Activity: Not on file  Stress: Not on file  Social Connections: Not on file    Social: Church friends of Shawn [name], new Shawn [name], he goes to [employer]; in 2024 I met his wife  Family History: The patient's family history includes Aneurysm in his mother and paternal grandmother; Bladder Cancer in his brother; CAD in his father; Cancer in his mother; Diabetes in his maternal grandmother and mother; Heart attack in his father and paternal grandfather; Heart disease in his father; Hypertension in his father, maternal grandmother, and paternal grandfather; Stroke in his mother. There is no history of Thyroid disease.  ROS:   Please see the history of present illness.     All other systems reviewed and are negative.  EKGs/Labs/Other Studies Reviewed:    The following studies were reviewed today:  EKG:   04/27/23: SR  06/12/22: SR 90   Cardiac Studies & Procedures       ECHOCARDIOGRAM  ECHOCARDIOGRAM COMPLETE 07/04/2022  Narrative ECHOCARDIOGRAM REPORT    Patient Name:   Shawn [name] Date of Exam: 07/04/2022 Medical Rec #:  [id number]     Height:       72.0 in Accession #:    [id number]    Weight:       191.0 lb Date of Birth:  [birth year]    BSA:          2.089 m Patient Age:    60 years      BP:           98/64 mmHg Patient Gender: M             HR:            84 bpm. Exam Location:  [address]  Procedure: 2D Echo, 3D Echo, Cardiac Doppler, Color Doppler and Strain Analysis  Indications:    r60.0 Lower extremity edema  History:        Patient has no prior history of Echocardiogram  examinations. Arrythmias:Palpitations, Signs/Symptoms:Chest Pain and Dyspnea; Risk Factors:Hypertension and Pre-diabetes.  Sonographer:    Daphine Deutscher RDCS Referring Phys: [id number] [facility] A Adryana Mogensen  IMPRESSIONS   1. Left ventricular ejection fraction, by estimation, is 55 to 60%. Left ventricular ejection fraction by 3D volume is 56 %. The left ventricle has normal function. The left ventricle has no regional wall motion abnormalities. Left ventricular diastolic parameters were normal. 2. Right ventricular systolic function is normal. The right ventricular size is normal. Tricuspid regurgitation signal is inadequate for assessing PA pressure. 3. The mitral valve is normal in structure. Trivial mitral valve regurgitation. No evidence of mitral stenosis. 4. The aortic valve is tricuspid. Aortic valve regurgitation is not visualized. 5. Small Echodensity seen in left atrium in imaging series 58 and 64 not seen in other series.  Conclusion(s)/Recommendation(s): Consider CMR if clinically indicated.  FINDINGS Left Ventricle: Left ventricular ejection fraction, by estimation, is 55 to 60%. Left ventricular ejection fraction by 3D volume is 56 %. The left ventricle has normal function. The left ventricle has no regional wall motion abnormalities. The left ventricular internal cavity size was normal in size. There is no left ventricular hypertrophy. Left ventricular diastolic parameters were normal.  Right Ventricle: The right ventricular size is normal. No increase in right ventricular wall thickness. Right ventricular systolic function is normal. Tricuspid regurgitation signal is inadequate for assessing PA pressure.  Left Atrium: Left atrial size  was normal in size.  Right Atrium: Right atrial size was normal in size.  Pericardium: There is no evidence of pericardial effusion.  Mitral Valve: The mitral valve is normal in structure. Trivial mitral valve regurgitation. No evidence of mitral valve stenosis.  Tricuspid Valve: The tricuspid valve is normal in structure. Tricuspid valve regurgitation is trivial. No evidence of tricuspid stenosis.  Aortic Valve: The aortic valve is tricuspid. Aortic valve regurgitation is not visualized.  Pulmonic Valve: The pulmonic valve was normal in structure. Pulmonic valve regurgitation is mild. No evidence of pulmonic stenosis.  Aorta: The aortic root and ascending aorta are structurally normal, with no evidence of dilitation.  IAS/Shunts: The atrial septum is grossly normal.   LEFT VENTRICLE PLAX 2D LVIDd:         4.90 cm         Diastology LVIDs:         3.30 cm         LV e' medial:    7.72 cm/s LV PW:         0.90 cm         LV E/e' medial:  14.8 LV IVS:        0.90 cm         LV e' lateral:   13.10 cm/s LVOT diam:     2.00 cm         LV E/e' lateral: 8.7 LV SV:         67 LV SV Index:   32              2D LVOT Area:     3.14 cm        Longitudinal Strain 2D Strain GLS  -23.5 % (A2C): 2D Strain GLS  -22.2 % (A3C): 2D Strain GLS  -23.7 % (A4C): 2D Strain GLS  -23.2 % Avg:  3D Volume EF LV 3D EF:    Left ventricul ar ejection fraction by 3D volume is 56 %.  3D Volume EF: 3D EF:  56 % LV EDV:       168 ml LV ESV:       74 ml LV SV:        94 ml  RIGHT VENTRICLE             IVC RV Basal diam:  3.90 cm     IVC diam: 2.00 cm RV S prime:     14.70 cm/s TAPSE (M-mode): 2.0 cm  LEFT ATRIUM             Index        RIGHT ATRIUM           Index LA diam:        4.60 cm 2.20 cm/m   RA Area:     11.60 cm LA Vol (A2C):   51.9 ml 24.84 ml/m  RA Volume:   25.90 ml  12.40 ml/m LA Vol (A4C):   48.2 ml 23.07 ml/m LA Biplane Vol: 50.1 ml 23.98 ml/m AORTIC  VALVE LVOT Vmax:   108.67 cm/s LVOT Vmean:  70.633 cm/s LVOT VTI:    0.213 m  AORTA Ao Root diam: 3.50 cm Ao Asc diam:  3.90 cm  MITRAL VALVE MV Area (PHT): 3.75 cm     SHUNTS MV Decel Time: 203 msec     Systemic VTI:  0.21 m MV E velocity: 114.50 cm/s  Systemic Diam: 2.00 cm MV A velocity: 78.60 cm/s MV E/A ratio:  1.46  Riley Lam MD Electronically signed by Riley Lam MD Signature Date/Time: 07/04/2022/11:31:50 AM    Final    MONITORS  LONG TERM MONITOR-LIVE TELEMETRY (3-14 DAYS) 02/15/2023  Narrative   Patient had a minimum heart rate of 52 bpm, maximum heart rate of 157 bpm, and average heart rate of 80 bpm.   Predominant underlying rhythm was sinus rhythm.   Short rare P-SVT.   Isolated PACs were rare (<1.0%).   Isolated PVCs were occasional (2.4%).   Triggered and diary events associated with sinus rhythm and sinus tachycardia.  No malignant arrhythmias.    CARDIAC MRI  MR CARDIAC MORPHOLOGY W WO CONTRAST 12/03/2022  Narrative CLINICAL DATA:  Clinical question of amyloid (hx of MGUS with risk factors),mass eval. Study assumes BSA of 2.14 m2  EXAM: CARDIAC MRI  TECHNIQUE: The patient was scanned on a 1.5 Tesla GE magnet. A dedicated cardiac coil was used. Functional imaging was done using Fiesta sequences. 2,3, and 4 chamber views were done to assess for RWMA's. Modified Simpson's rule using a short axis stack was used to calculate an ejection fraction on a dedicated work [employer]. The patient received 10 cc of Gadavist. After 10 minutes inversion recovery sequences were used to assess for infiltration and scar tissue. Flow quantification was performed 2 times during this examination with flow quantification performed at the levels of the ascending aorta above the valve, pulmonary artery above the valve.  CONTRAST:  10 cc  of Gadavist  FINDINGS: 1. Very mild increase in left ventricular size, with LVEDD 54  mm, but LVEDV 200 mL, and LVEDVi 93 mL/m2.  Normal left ventricular thickness, with intraventricular septal thickness of 10 mm, posterior wall thickness of 7 mm, and myocardial mass index of 55 g/m2.  Normal left ventricular systolic function (LVEF =56%). There are no regional wall motion abnormalities.  Left ventricular parametric mapping notable for normal native T1 and T2 signal.  There is no late gadolinium enhancement in the left ventricular myocardium.  2.  Mild increase  in right ventricular size with RVEDVI 114 mL/m2.  Normal right ventricular thickness.  Normal right ventricular systolic function (RVEF =51%). There are no regional wall motion abnormalities or aneurysms.  3. Normal left and right atrial size. There is a prominent coumadin ridge that appears to bifurcate. It is isointense on T1 and T2. There is no evidence of enhancement. High inversion time sequence not performed.  4. Normal size of the aortic root, ascending aorta and pulmonary artery.  5. Valve assessment:  Aortic Valve: Tri-leaflet aortic valve. There is no significant regurgitation. Regurgitant fraction 1%.  Pulmonic Valve: There is no significant regurgitation. Regurgitant fraction 2%.  Tricuspid Valve: Qualitatively there is no significant regurgitation. The is breathhold artifact on RV volumes.  Mitral Valve: There is no significant regurgitation. Regurgitant fraction 4%.  6.  Normal pericardium.  No pericardial effusion.  7. Grossly, no extracardiac findings. Recommended dedicated study if concerned for non-cardiac pathology.  IMPRESSION: Study not consistent with cardiac amyloidosis.  Left atrial echodensity is likely prominent coumadin ridge (normal variant structure).  Riley Lam MD   Electronically Signed By: Riley Lam M.D. On: 12/03/2022 14:25          Recent Labs: 06/12/2022: NT-Pro BNP 84 01/30/2023: TSH 0.27 03/16/2023: ALT 13; BUN 10;  Creatinine 0.83; Hemoglobin 12.4; Platelet Count 339; Potassium 4.0; Sodium 139  Recent Lipid Panel No results found for: "CHOL", "TRIG", "HDL", "CHOLHDL", "VLDL", "LDLCALC", "LDLDIRECT"      Physical Exam:    VS:  BP 118/70   Pulse 89   Ht 6\' 1"  (1.854 m)   Wt 201 lb (91.2 kg)   SpO2 98%   BMI 26.52 kg/m     Wt Readings from Last 3 Encounters:  04/27/23 201 lb (91.2 kg)  03/30/23 201 lb 12.8 oz (91.5 kg)  02/23/23 190 lb (86.2 kg)    Gen: Mild distress distress   Neck: No JVD Cardiac: No Rubs or Gallops, no murmur, RRR, +2 radial pulses Respiratory: Clear to auscultation bilaterally, normal effort, normal  respiratory rate GI: Soft, nontender, non-distended  MS: non pitting edema;  moves all extremities Integument: faint bilateral macular rash on shins Neuro:  At time of evaluation, alert and oriented to person/place/time/situation  Psych: Normal affect, patient feels ok  ASSESSMENT:    1. PVC's (premature ventricular contractions)   2. Atrial tachycardia   3. Aortic atherosclerosis (HCC)      PLAN:     Hx of AT PVC - continue BB  HTN - continue ACE  Aortic atherosclerosis - LDL goal < 70  Discussed with patient and wife; do not see a cardiac etiology that would give his constellation of symptoms.  I think rheumatologic evaluation (pending) is appropriate  One year with me        Medication Adjustments/Labs and Tests Ordered: Current medicines are reviewed at length with the patient today.  Concerns regarding medicines are outlined above.  Orders Placed This Encounter  Procedures   EKG 12-Lead   No orders of the defined types were placed in this encounter.   Patient Instructions  Medication Instructions:  Your physician recommends that you continue on your current medications as directed. Please refer to the Current Medication list given to you today.  *If you need a refill on your cardiac medications before your next appointment, please call  your pharmacy*   Lab Work: NONE If you have labs (blood work) drawn today and your tests are completely normal, you will receive your results only  by: MyChart Message (if you have MyChart) OR A paper copy in the mail If you have any lab test that is abnormal or we need to change your treatment, we will call you to review the results.   Testing/Procedures: NONE   Follow-Up: At [facility], you and your health needs are our priority.  As part of our continuing mission to provide you with exceptional heart care, we have created designated Provider Care Teams.  These Care Teams include your primary Cardiologist (physician) and Advanced Practice Providers (APPs -  Physician Assistants and Nurse Practitioners) who all work together to provide you with the care you need, when you need it.     Your next appointment:   1 year(s)  Provider:   Riley Lam, MD       Signed, Shawn Constant, MD  04/27/2023 5:29 PM    [facility]

## 2023-05-08 DIAGNOSIS — L0292 Furuncle, unspecified: Secondary | ICD-10-CM | POA: Diagnosis not present

## 2023-05-22 DIAGNOSIS — M4800 Spinal stenosis, site unspecified: Secondary | ICD-10-CM | POA: Diagnosis not present

## 2023-05-22 DIAGNOSIS — M25551 Pain in right hip: Secondary | ICD-10-CM | POA: Diagnosis not present

## 2023-05-22 DIAGNOSIS — M6281 Muscle weakness (generalized): Secondary | ICD-10-CM | POA: Diagnosis not present

## 2023-05-22 DIAGNOSIS — G629 Polyneuropathy, unspecified: Secondary | ICD-10-CM | POA: Diagnosis not present

## 2023-05-22 DIAGNOSIS — M25552 Pain in left hip: Secondary | ICD-10-CM | POA: Diagnosis not present

## 2023-05-26 ENCOUNTER — Encounter: Payer: Self-pay | Admitting: Gastroenterology

## 2023-05-26 ENCOUNTER — Other Ambulatory Visit (INDEPENDENT_AMBULATORY_CARE_PROVIDER_SITE_OTHER): Payer: BC Managed Care – PPO

## 2023-05-26 ENCOUNTER — Ambulatory Visit (INDEPENDENT_AMBULATORY_CARE_PROVIDER_SITE_OTHER): Payer: BC Managed Care – PPO | Admitting: Gastroenterology

## 2023-05-26 VITALS — BP 120/78 | HR 74 | Ht 71.0 in | Wt 198.5 lb

## 2023-05-26 DIAGNOSIS — K2970 Gastritis, unspecified, without bleeding: Secondary | ICD-10-CM

## 2023-05-26 DIAGNOSIS — D509 Iron deficiency anemia, unspecified: Secondary | ICD-10-CM

## 2023-05-26 DIAGNOSIS — Z86010 Personal history of colonic polyps: Secondary | ICD-10-CM

## 2023-05-26 DIAGNOSIS — K219 Gastro-esophageal reflux disease without esophagitis: Secondary | ICD-10-CM

## 2023-05-26 LAB — HEMATOCRIT: HCT: 39.2 % (ref 39.0–52.0)

## 2023-05-26 LAB — HEMOGLOBIN: Hemoglobin: 12.8 g/dL — ABNORMAL LOW (ref 13.0–17.0)

## 2023-05-26 MED ORDER — PANTOPRAZOLE SODIUM 20 MG PO TBEC: 20.0000 mg | DELAYED_RELEASE_TABLET | Freq: Every day | ORAL | 3 refills | Status: DC

## 2023-05-26 NOTE — Patient Instructions (Addendum)
Your provider has requested that you go to the basement level for lab work before leaving today. Press "B" on the elevator. The lab is located at the first door on the left as you exit the elevator.  We have sent the following medications to your pharmacy for you to pick up at your convenience: Pantoprazole 20 mg   Due to recent changes in healthcare laws, you may see the results of your imaging and laboratory studies on MyChart before your provider has had a chance to review them.  We understand that in some cases there may be results that are confusing or concerning to you. Not all laboratory results come back in the same time frame and the provider may be waiting for multiple results in order to interpret others.  Please give [place] 48 hours in order for your provider to thoroughly review all the results before contacting the office for clarification of your results.    _______________________________________________________  If your blood pressure at your visit was 140/90 or greater, please contact your primary care physician to follow up on this.  _______________________________________________________  If you are age 63 or older, your body mass index should be between 23-30. Your Body mass index is 27.69 kg/m. If this is out of the aforementioned range listed, please consider follow up with your Primary Care Provider.  If you are age 33 or younger, your body mass index should be between 19-25. Your Body mass index is 27.69 kg/m. If this is out of the aformentioned range listed, please consider follow up with your Primary Care Provider.   ________________________________________________________  The [facility] providers would like to encourage you to use [facility] to communicate with providers for non-urgent requests or questions.  Due to long hold times on the telephone, sending your provider a message by [facility] may be a faster and more efficient way to get a response.  Please allow 48  business hours for a response.  Please remember that this is for non-urgent requests.  _______________________________________________________   I appreciate the  opportunity to care for you  Thank You   Marsa Aris , MD

## 2023-05-26 NOTE — Progress Notes (Unsigned)
Shawn [name]    [id number]    Jan 28, 1961  Primary Care Physician:Chow, Zannie Cove, MD  Referring Physician: Penelope Galas, MD [address] [place],  [place] [address]   Chief complaint: GERD, gastritis  HPI: 62 year old very pleasant gentleman is here for follow-up visit accompanied by his wife He is taking Pantoprazole 20mg  once daily and reluctant to do it feeling well overall. Denies any breakthrough heartburn, abdominal pain, nausea, vomiting, melena or rectal bleeding.  Review of system positive for rash on lower extremity.  He has lower extremity edema and venous stasis dermatitis, was advised to wear compression stockings but is reluctant to do it  EGD 02/23/23  - LA Grade B reflux esophagitis with no bleeding. Biopsied. - 3 cm hiatal hernia. - Gastritis. Biopsied. - Erythematous duodenopathy. Biopsied.  Colonoscopy 02/23/23 - One 5 mm polyp in the transverse colon (SSP), removed with a cold snare. Resected and retrieved. - Diverticulosis in the sigmoid colon and in the descending colon. - Non- bleeding external and internal hemorrhoids.   Outpatient Encounter Medications as of 05/26/2023  Medication Sig   gabapentin (NEURONTIN) 300 MG capsule Take by mouth. Take 1 tab in the morning and 2 tabs in the evening   lisinopril (ZESTRIL) 20 MG tablet Take 1 tablet (20 mg total) by mouth every morning.   metoprolol succinate (TOPROL-XL) 50 MG 24 hr tablet Take 1 tablet (50 mg total) by mouth daily.   mupirocin ointment (BACTROBAN) 2 % 1 Application 2 (two) times daily.   pantoprazole (PROTONIX) 20 MG tablet Take 20 mg by mouth daily.   SYRINGE-NEEDLE, DISP, 3 ML (B-D 3CC LUER-LOK SYR 21GX1-1/2) 21G X 1-1/2" 3 ML MISC USE AS DIRECTED FOR TESTOSTERONE ADMINISTRATION   testosterone cypionate (DEPOTESTOSTERONE CYPIONATE) 200 MG/ML injection INJECT 1.5 MLS INTO THE MUSCLE EVERY 14 DAYS.   [DISCONTINUED] celecoxib (CELEBREX) 200 MG capsule Take 200 mg by mouth 2 (two)  times daily. (Patient not taking: Reported on 04/27/2023)   [DISCONTINUED] cetirizine (ZYRTEC) 10 MG tablet Take 10 mg by mouth daily.   [DISCONTINUED] omeprazole (PRILOSEC) 40 MG capsule Take 1 capsule (40 mg total) by mouth in the morning and at bedtime. (Patient not taking: Reported on 04/27/2023)   No facility-administered encounter medications on file as of 05/26/2023.    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-followed by Dr Jason Fila   Multiple thyroid nodules    Onychomycosis    treated with lamisil in the past   Other chronic pain    Pain in right hand    Palpitations    Prediabetes    Pruritus    Retinal detachment    right eye caused blindness   Tinea cruris  Tinnitus    Total retinal detachment of right eye     Past Surgical History:  Procedure Laterality Date   APPENDECTOMY     COLONOSCOPY     COLONOSCOPY WITH ESOPHAGOGASTRODUODENOSCOPY (EGD)  02/23/2023   CYSTOSCOPY W/ URETERAL STENT PLACEMENT  08/24/2012   Procedure: CYSTOSCOPY WITH RETROGRADE PYELOGRAM/URETERAL STENT PLACEMENT;  Surgeon: Kathi Ludwig, MD;  Location: [facility];  Service: Urology;  Laterality: Right;   CYSTOSCOPY/RETROGRADE/URETEROSCOPY  08/24/2012   Procedure: CYSTOSCOPY/RETROGRADE/URETEROSCOPY;  Surgeon: Kathi Ludwig, MD;  Location: [facility];  Service: Urology;  Laterality: Right;  [place]eting    CYSTOSCOPY/RETROGRADE/URETEROSCOPY/STONE EXTRACTION WITH BASKET Left 04/11/2013   Procedure: CYSTOSCOPY/RETROGRADE/URETEROSCOPY/STONE EXTRACTION WITH BASKET;  Surgeon: Kathi Ludwig, MD;  Location: [facility];  Service: Urology;  Laterality: Left;  BASKET STONE EXTRACTION WITH URETEROSCOPE     Right retina detachment      Family History  Problem Relation Age of Onset   Diabetes Mother    Stroke Mother    Aneurysm Mother    Cancer Mother    Heart attack Father    Hypertension Father    CAD Father    Heart disease Father    Bladder Cancer Brother    Diabetes Maternal Grandmother    Hypertension Maternal Grandmother    Aneurysm Paternal Grandmother    Heart attack Paternal Grandfather    Hypertension Paternal Grandfather    Thyroid disease Neg Hx     Social History   Socioeconomic History   Marital status: Married    Spouse name: Not on file   Number of children: Not on file   Years of education: Not on file   Highest education level: Not on file  Occupational History   Not on file  Tobacco Use   Smoking status: Former    Types: Pipe   Smokeless tobacco: Never  Vaping Use   Vaping Use: Never used  Substance and Sexual Activity   Alcohol use: Yes    Comment: ocassionally beer or wine   Drug use: No   Sexual activity: Never  Other Topics Concern   Not on file  Social History Narrative   Not on file   Social Determinants of Health   Financial Resource Strain: Not on file  Food Insecurity: Not on file  Transportation Needs: Not on file  Physical Activity: Not on file  Stress: Not on file  Social Connections: Not on file  Intimate Partner Violence: Not on file      Review of systems: All other review of systems negative except as mentioned in the HPI.   Physical Exam: Vitals:   05/26/23 1010  BP: 120/78  Pulse: 74   Body mass index is 27.69 kg/m. Gen:      No acute distress HEENT:  sclera anicteric Abd:      soft,  non-tender; no palpable masses, no distension Ext:    No edema Neuro: alert and oriented x 3 Bilateral lower extremity edema and erythematous rash Psych: normal mood and affect  Data Reviewed:  Reviewed labs, radiology imaging, old records and pertinent past GI work up   Assessment and Plan/Recommendations:  62 year old very pleasant gentleman with hypertension, peripheral neuropathy, bilateral edema with venous stasis dermatitis and chronic GERD  GERD: Stable continue antireflux measures and pantoprazole 20 mg daily  Peripheral neuropathy, edema and venous stasis dermatitis: Encourage patient to wear compression stockings Follow-up with dermatology and rheumatology  Iron deficiency anemia: Improving Follow-up hemoglobin and hematocrit  History of colon  polyps, due for surveillance colonoscopy in 5 years in March 2029  Return in 1 year or sooner if needed  This visit required 40 minutes of patient care (this includes precharting, chart review, review of results, face-to-face time used for counseling as well as treatment plan and follow-up. The patient was provided an opportunity to ask questions and all were answered. The patient agreed with the plan and demonstrated an understanding of the instructions.  Iona Beard , MD    CC: Chow, Zannie Cove, MD

## 2023-05-27 ENCOUNTER — Telehealth: Payer: Self-pay

## 2023-05-27 ENCOUNTER — Other Ambulatory Visit (HOSPITAL_COMMUNITY): Payer: Self-pay

## 2023-05-27 NOTE — Telephone Encounter (Signed)
Patient Advocate Encounter   Received notification that prior authorization for Pantoprazole Sodium 20MG  dr tablets is required.   PA submitted on 05/27/23 Key [id number] Status is pending

## 2023-05-28 ENCOUNTER — Encounter: Payer: Self-pay | Admitting: Gastroenterology

## 2023-05-31 NOTE — Telephone Encounter (Signed)
Patient Advocate Encounter  Prior Authorization for Pantoprazole Sodium 20MG  dr tablets has been approved.     Effective: 05-29-2023 to 05-28-2024

## 2023-06-01 NOTE — Telephone Encounter (Signed)
Noted. Thanks.

## 2023-06-02 ENCOUNTER — Other Ambulatory Visit: Payer: BLUE CROSS/BLUE SHIELD

## 2023-06-02 ENCOUNTER — Other Ambulatory Visit: Payer: Self-pay | Admitting: Urology

## 2023-06-04 ENCOUNTER — Ambulatory Visit (INDEPENDENT_AMBULATORY_CARE_PROVIDER_SITE_OTHER): Payer: BC Managed Care – PPO | Admitting: Urology

## 2023-06-04 ENCOUNTER — Encounter: Payer: Self-pay | Admitting: Urology

## 2023-06-04 ENCOUNTER — Ambulatory Visit: Payer: BLUE CROSS/BLUE SHIELD | Admitting: Urology

## 2023-06-04 VITALS — BP 127/69 | HR 98 | Ht 71.0 in | Wt 194.0 lb

## 2023-06-04 DIAGNOSIS — Z125 Encounter for screening for malignant neoplasm of prostate: Secondary | ICD-10-CM | POA: Diagnosis not present

## 2023-06-04 DIAGNOSIS — E349 Endocrine disorder, unspecified: Secondary | ICD-10-CM | POA: Diagnosis not present

## 2023-06-04 DIAGNOSIS — E291 Testicular hypofunction: Secondary | ICD-10-CM | POA: Diagnosis not present

## 2023-06-04 NOTE — Progress Notes (Signed)
Marcelle Overlie Plume,acting as a scribe for Riki Altes, MD.,have documented all relevant documentation on the behalf of Riki Altes, MD,as directed by  Riki Altes, MD while in the presence of Riki Altes, MD.  06/04/2023 2:11 PM   Shawn [name] [birth year] [id number]  Referring provider: Wilfrid Lund, PA [address] [place],  [place] [address]  Chief Complaint  Patient presents with   Hypogonadism    HPI: 62 y.o. male presents for follow-up of hypogonadism.  Continues on testosterone. 1.5 cc's Q2 weeks; Last injection was 5 days ago. No bothersome LUTS Has not had a testosterone level checked since 02/2023 which was 1151 ng/dl. 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-followed by Dr Jason Fila   Onychomycosis    treated with lamisil in the past   Retinal detachment    right eye caused blindness    Surgical History: Past Surgical History:  Procedure Laterality Date   APPENDECTOMY     CYSTOSCOPY W/ URETERAL STENT PLACEMENT  08/24/2012   Procedure: CYSTOSCOPY WITH RETROGRADE PYELOGRAM/URETERAL STENT PLACEMENT;  Surgeon: Kathi Ludwig, MD;  Location: [facility];  Service: Urology;  Laterality: Right;   CYSTOSCOPY/RETROGRADE/URETEROSCOPY  08/24/2012   Procedure: CYSTOSCOPY/RETROGRADE/URETEROSCOPY;  Surgeon: Kathi Ludwig, MD;  Location: [facility];  Service: Urology;  Laterality: Right;  [place]   CYSTOSCOPY/RETROGRADE/URETEROSCOPY/STONE EXTRACTION WITH BASKET Left 04/11/2013   Procedure: CYSTOSCOPY/RETROGRADE/URETEROSCOPY/STONE EXTRACTION WITH BASKET;  Surgeon: Kathi Ludwig, MD;  Location: [facility];  Service: Urology;  Laterality: Left;  BASKET STONE EXTRACTION WITH URETEROSCOPE     Right retina detachment      Home Medications:  Allergies as of 06/04/2022   No Known Allergies      Medication List        Accurate as of June 04, 2022  2:11 PM. If you have any questions, ask your nurse or doctor.          celecoxib 200 MG capsule Commonly known as: CELEBREX Take 200 mg by mouth daily as needed.   gabapentin 300 MG capsule Commonly known as: NEURONTIN Take by mouth.   lisinopril 20 MG tablet Commonly known as: ZESTRIL Take 1 tablet by mouth every morning.   Luer Lock Safety Syringes 21G X 1-1/2" 3 ML Misc Generic drug: SYRINGE-NEEDLE (DISP) 3 ML Use as directed for testosterone administration   metoprolol succinate 50 MG 24 hr tablet Commonly known as: TOPROL-XL Take 50 mg by mouth daily.   NEEDLE (DISP) 18 G 18G X 1" Misc Use as directed   testosterone cypionate 200 MG/ML injection Commonly known as: DEPOTESTOSTERONE CYPIONATE INJECT 1 ML (200 MG TOTAL) INTO THE MUSCLE EVERY 14 DAYS       Family History: Family History  Problem Relation Age of Onset   Diabetes Mother    Stroke Mother    Aneurysm Mother    Heart attack Father    Hypertension Father    CAD Father    Heart disease Father    Aneurysm Paternal Grandmother    Heart  attack Paternal Grandfather    Thyroid disease Neg Hx     Social History:  reports that he has quit smoking. His smoking use included pipe. He has never used smokeless tobacco. He reports current alcohol use. He reports that he does not use drugs.   Physical Exam: BP 132/82   Pulse 91   Ht 5' 10.5" (1.791 m)   Wt 192 lb (87.1 kg)   BMI 27.16 kg/m   Constitutional:  Alert and oriented, No acute distress. HEENT: [place] AT Respiratory: Normal respiratory effort, no increased work of breathing. Psychiatric: Normal mood and affect.  Assessment & Plan:    1.  Hypogonadism PSA and testosterone level  drawn today Lab visit 6 months, testosterone, H&H Office visit in 1 year with testosterone, hematocrit, and PSA  I have reviewed the above documentation for accuracy and completeness, and I agree with the above.   Riki Altes, MD  [facility] [address], [address] [place], [place] [address] [phone]

## 2023-06-05 LAB — TESTOSTERONE: Testosterone: 1245 ng/dL — ABNORMAL HIGH (ref 264–916)

## 2023-06-05 LAB — PSA: Prostate Specific Ag, Serum: 0.5 ng/mL (ref 0.0–4.0)

## 2023-06-12 DIAGNOSIS — M25551 Pain in right hip: Secondary | ICD-10-CM | POA: Diagnosis not present

## 2023-06-12 DIAGNOSIS — M4800 Spinal stenosis, site unspecified: Secondary | ICD-10-CM | POA: Diagnosis not present

## 2023-06-12 DIAGNOSIS — M25552 Pain in left hip: Secondary | ICD-10-CM | POA: Diagnosis not present

## 2023-06-12 DIAGNOSIS — G629 Polyneuropathy, unspecified: Secondary | ICD-10-CM | POA: Diagnosis not present

## 2023-06-13 DIAGNOSIS — E049 Nontoxic goiter, unspecified: Secondary | ICD-10-CM | POA: Diagnosis not present

## 2023-06-13 DIAGNOSIS — I10 Essential (primary) hypertension: Secondary | ICD-10-CM | POA: Diagnosis not present

## 2023-06-25 ENCOUNTER — Other Ambulatory Visit: Payer: Self-pay | Admitting: *Deleted

## 2023-06-25 DIAGNOSIS — R600 Localized edema: Secondary | ICD-10-CM

## 2023-06-30 DIAGNOSIS — S81801A Unspecified open wound, right lower leg, initial encounter: Secondary | ICD-10-CM | POA: Diagnosis not present

## 2023-07-08 DIAGNOSIS — H903 Sensorineural hearing loss, bilateral: Secondary | ICD-10-CM | POA: Diagnosis not present

## 2023-07-08 DIAGNOSIS — J3489 Other specified disorders of nose and nasal sinuses: Secondary | ICD-10-CM | POA: Diagnosis not present

## 2023-07-13 NOTE — Progress Notes (Unsigned)
[facility]   ASSESSMENT / PLAN: Shawn [name] is a 62 y.o. male with rash of bilateral lower extremities. He has chronic venous insufficiency of bilateral lower extremities. Workup to date has been unrevealing.  He has seen a rheumatologist, dermatologist, heme-onc specialist, and other physicians.  His rash appears typical of vasculitis to me.  I do not think treating his chronic venous insufficiency will make his rash go away, as compression and elevation has actually exacerbated the rash.  I encouraged him to get a second opinion from a rheumatologist an academic center.  I will see him on an as-needed basis.  CHIEF COMPLAINT: Question vasculitis  HISTORY OF PRESENT ILLNESS: Shawn [name] is a 62 y.o. male referred to clinic for evaluation of bilateral lower extremity rash.  The rash appears consistent with vasculitis.  There is multifocal, subcentimeter, macular, erythematous rash cross the medial malleoli, feet, and toes bilaterally.  This has been exacerbated by swelling.  Compression sock also exacerbates the rash.  He has tried steroid cream without relief.  He has had a biopsy which was unrevealing.  He has had a rheumatologic workup which was unrevealing.  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-followed by Dr Jason Fila   Multiple thyroid nodules    Onychomycosis    treated with lamisil in the past   Other chronic pain    Pain  in right hand    Palpitations    Prediabetes    Pruritus    Retinal detachment    right eye caused blindness   Tinea cruris    Tinnitus    Total retinal detachment of right eye     Past Surgical History:  Procedure Laterality Date   APPENDECTOMY     COLONOSCOPY     COLONOSCOPY WITH ESOPHAGOGASTRODUODENOSCOPY (EGD)  02/23/2023   CYSTOSCOPY W/ URETERAL STENT PLACEMENT  08/24/2012   Procedure: CYSTOSCOPY WITH RETROGRADE PYELOGRAM/URETERAL STENT PLACEMENT;  Surgeon: Kathi Ludwig, MD;  Location: [facility];  Service: Urology;  Laterality: Right;   CYSTOSCOPY/RETROGRADE/URETEROSCOPY  08/24/2012   Procedure: CYSTOSCOPY/RETROGRADE/URETEROSCOPY;  Surgeon: Kathi Ludwig, MD;  Location: [facility];  Service: Urology;  Laterality: Right;  [place]   CYSTOSCOPY/RETROGRADE/URETEROSCOPY/STONE EXTRACTION WITH BASKET Left 04/11/2013   Procedure: CYSTOSCOPY/RETROGRADE/URETEROSCOPY/STONE EXTRACTION WITH BASKET;  Surgeon: Kathi Ludwig, MD;  Location: [facility];  Service: Urology;  Laterality: Left;  BASKET STONE EXTRACTION WITH URETEROSCOPE     Right retina detachment      Family History  Problem Relation Age of Onset   Diabetes Mother    Stroke Mother    Aneurysm Mother    Cancer Mother    Heart attack Father    Hypertension Father    CAD Father    Heart disease Father    Bladder Cancer Brother    Diabetes  Maternal Grandmother    Hypertension Maternal Grandmother    Aneurysm Paternal Grandmother    Heart attack Paternal Grandfather    Hypertension Paternal Grandfather    Thyroid disease Neg Hx     Social History   Socioeconomic History   Marital status: Married    Spouse name: Not on file   Number of children: Not on file   Years of education: Not on file   Highest education level: Not on file  Occupational History   Not on file  Tobacco Use   Smoking status: Former    Types: Pipe   Smokeless tobacco: Never  Vaping Use   Vaping status:  Never Used  Substance and Sexual Activity   Alcohol use: Yes    Comment: ocassionally beer or wine   Drug use: No   Sexual activity: Never  Other Topics Concern   Not on file  Social History Narrative   Not on file   Social Determinants of Health   Financial Resource Strain: Not on file  Food Insecurity: Not on file  Transportation Needs: Not on file  Physical Activity: Not on file  Stress: Not on file  Social Connections: Unknown (03/10/2023)   Received [facility], [facility]   Social Network    Social Network: Not on file  Intimate Partner Violence: Unknown (03/10/2023)   Received from [facility], [facility]   HITS    Physically Hurt: Not on file    Insult or Talk Down To: Not on file    Threaten Physical Harm: Not on file    Scream or Curse: Not on file    No Known Allergies  Current Outpatient Medications  Medication Sig Dispense Refill   doxycycline (VIBRAMYCIN) 100 MG capsule 100 mg daily.     gabapentin (NEURONTIN) 300 MG capsule Take by mouth. Take 1 tab in the morning and 2 tabs in the evening     lisinopril (ZESTRIL) 20 MG tablet Take 1 tablet (20 mg total) by mouth every morning. 90 tablet 3   metoprolol succinate (TOPROL-XL) 50 MG 24 hr tablet Take 1 tablet (50 mg total) by mouth daily. 90 tablet 3   mupirocin ointment (BACTROBAN) 2 % 1 Application 2 (two) times daily.     pantoprazole (PROTONIX) 20 MG tablet Take 1 tablet (20 mg total) by mouth daily. 90 tablet 3   SYRINGE-NEEDLE, DISP, 3 ML (B-D 3CC LUER-LOK SYR 21GX1-1/2) 21G X 1-1/2" 3 ML MISC USE AS DIRECTED FOR TESTOSTERONE ADMINISTRATION 38 each 1   testosterone cypionate (DEPOTESTOSTERONE CYPIONATE) 200 MG/ML injection INJECT 1.5 MLS INTO THE MUSCLE EVERY 14 DAYS. 4 mL 2   No current facility-administered medications for this visit.    PHYSICAL EXAM Vitals:   07/14/23 1434  BP: 115/71  Pulse: 95  Resp: 20  Temp: 98.8 F (37.1 C)  SpO2: 99%  Weight: 197 lb (89.4 kg)  Height:  5\' 11"  (1.803 m)    Well-appearing man in no distress Regular rate and rhythm Unlabored breathing 2+ posterior tibial pulses bilaterally Erythematous, macular subcentimeter lesions across the medial malleoli, ankles, feet, and toes bilaterally, right worse than left. Mild edema bilateral lower extremities with scattered varicosities, reticular veins consistent with chronic venous insufficiency  PERTINENT LABORATORY AND RADIOLOGIC DATA  Most recent CBC    Latest Ref Rng & Units 05/26/2023   11:15 AM 03/16/2023   11:34 AM 01/18/2023    7:56 AM  CBC  WBC 4.0 - 10.5 K/uL  9.3  8.0  Hemoglobin 13.0 - 17.0 g/dL 16.1  09.6  04.5   Hematocrit 39.0 - 52.0 % 39.2  38.0  37.6   Platelets 150 - 400 K/uL  339  287      Most recent CMP    Latest Ref Rng & Units 03/16/2023   11:34 AM 01/18/2023    7:56 AM 01/27/2022    4:54 PM  CMP  Glucose 70 - 99 mg/dL 96  409  80   BUN 8 - 23 mg/dL 10  11  16    Creatinine 0.61 - 1.24 mg/dL 8.11  9.14  7.82   Sodium 135 - 145 mmol/L 139  136  137   Potassium 3.5 - 5.1 mmol/L 4.0  4.5  4.7   Chloride 98 - 111 mmol/L 104  103  101   CO2 22 - 32 mmol/L 30  27  23    Calcium 8.9 - 10.3 mg/dL 9.4  8.5  9.4   Total Protein 6.5 - 8.1 g/dL 7.7  7.0  7.8   Total Bilirubin 0.3 - 1.2 mg/dL 0.5  1.1  0.3   Alkaline Phos 38 - 126 U/L 52  46  77   AST 15 - 41 U/L 14  18  17    ALT 0 - 44 U/L 13  12  13      Renal function CrCl cannot be calculated (Patient's most recent lab result is older than the maximum 21 days allowed.).  Hgb A1c MFr Bld (%)  Date Value  01/27/2022 5.5   Right lower extremity venous :  - No evidence of deep vein thrombosis seen in the right lower extremity,  from the common femoral through the popliteal veins.  - No evidence of superficial venous thrombosis in the right lower  extremity.    - The deep venous system is incompetent at the common femoral vein.  - The great saphenous vein is incompetent.  - The small saphenous vein is  incompetent.   Rande Brunt. Lenell Antu, MD FACS [facility] Phone Number: [phone] 07/13/2023 4:51 PM   Total time spent on preparing this encounter including chart review, data review, collecting history, examining the patient, coordinating care for this new patient, 45 minutes.  Portions of this report may have been transcribed using voice recognition software.  Every effort has been made to ensure accuracy; however, inadvertent computerized transcription errors may still be present.

## 2023-07-14 ENCOUNTER — Encounter: Payer: Self-pay | Admitting: Vascular Surgery

## 2023-07-14 ENCOUNTER — Ambulatory Visit (INDEPENDENT_AMBULATORY_CARE_PROVIDER_SITE_OTHER): Payer: BC Managed Care – PPO | Admitting: Vascular Surgery

## 2023-07-14 ENCOUNTER — Ambulatory Visit (HOSPITAL_COMMUNITY)
Admission: RE | Admit: 2023-07-14 | Discharge: 2023-07-14 | Disposition: A | Discharge status: Home / Self Care | Payer: BC Managed Care – PPO | Source: Ambulatory Visit | Attending: Vascular Surgery | Admitting: Vascular Surgery

## 2023-07-14 VITALS — BP 115/71 | HR 95 | Temp 98.8°F | Resp 20 | Ht 71.0 in | Wt 197.0 lb

## 2023-07-14 DIAGNOSIS — R600 Localized edema: Secondary | ICD-10-CM | POA: Diagnosis not present

## 2023-07-14 DIAGNOSIS — I872 Venous insufficiency (chronic) (peripheral): Secondary | ICD-10-CM

## 2023-07-24 DIAGNOSIS — M48062 Spinal stenosis, lumbar region with neurogenic claudication: Secondary | ICD-10-CM | POA: Diagnosis not present

## 2023-07-24 DIAGNOSIS — M4316 Spondylolisthesis, lumbar region: Secondary | ICD-10-CM | POA: Diagnosis not present

## 2023-07-29 ENCOUNTER — Other Ambulatory Visit: Payer: Self-pay | Admitting: Internal Medicine

## 2023-07-29 DIAGNOSIS — S81801A Unspecified open wound, right lower leg, initial encounter: Secondary | ICD-10-CM | POA: Diagnosis not present

## 2023-07-29 DIAGNOSIS — M5431 Sciatica, right side: Secondary | ICD-10-CM

## 2023-07-29 DIAGNOSIS — M5432 Sciatica, left side: Secondary | ICD-10-CM

## 2023-07-29 DIAGNOSIS — M48062 Spinal stenosis, lumbar region with neurogenic claudication: Secondary | ICD-10-CM

## 2023-07-29 DIAGNOSIS — M4316 Spondylolisthesis, lumbar region: Secondary | ICD-10-CM

## 2023-07-29 DIAGNOSIS — L718 Other rosacea: Secondary | ICD-10-CM | POA: Diagnosis not present

## 2023-08-05 ENCOUNTER — Ambulatory Visit
Admission: RE | Admit: 2023-08-05 | Discharge: 2023-08-05 | Disposition: A | Discharge status: Home / Self Care | Payer: BC Managed Care – PPO | Source: Ambulatory Visit | Attending: Internal Medicine | Admitting: Internal Medicine

## 2023-08-05 DIAGNOSIS — M48062 Spinal stenosis, lumbar region with neurogenic claudication: Secondary | ICD-10-CM

## 2023-08-05 DIAGNOSIS — M5431 Sciatica, right side: Secondary | ICD-10-CM

## 2023-08-05 DIAGNOSIS — M48061 Spinal stenosis, lumbar region without neurogenic claudication: Secondary | ICD-10-CM | POA: Diagnosis not present

## 2023-08-05 DIAGNOSIS — M5136 Other intervertebral disc degeneration, lumbar region: Secondary | ICD-10-CM | POA: Diagnosis not present

## 2023-08-05 DIAGNOSIS — M4316 Spondylolisthesis, lumbar region: Secondary | ICD-10-CM

## 2023-08-05 DIAGNOSIS — M5432 Sciatica, left side: Secondary | ICD-10-CM

## 2023-08-14 DIAGNOSIS — M4316 Spondylolisthesis, lumbar region: Secondary | ICD-10-CM | POA: Diagnosis not present

## 2023-08-14 DIAGNOSIS — M48062 Spinal stenosis, lumbar region with neurogenic claudication: Secondary | ICD-10-CM | POA: Diagnosis not present

## 2023-08-27 DIAGNOSIS — J3489 Other specified disorders of nose and nasal sinuses: Secondary | ICD-10-CM | POA: Diagnosis not present

## 2023-08-30 ENCOUNTER — Other Ambulatory Visit: Payer: Self-pay | Admitting: Internal Medicine

## 2023-09-01 DIAGNOSIS — J3489 Other specified disorders of nose and nasal sinuses: Secondary | ICD-10-CM | POA: Diagnosis not present

## 2023-09-01 DIAGNOSIS — H903 Sensorineural hearing loss, bilateral: Secondary | ICD-10-CM | POA: Diagnosis not present

## 2023-09-18 DIAGNOSIS — M48062 Spinal stenosis, lumbar region with neurogenic claudication: Secondary | ICD-10-CM | POA: Diagnosis not present

## 2023-09-18 DIAGNOSIS — M4316 Spondylolisthesis, lumbar region: Secondary | ICD-10-CM | POA: Diagnosis not present

## 2023-09-21 ENCOUNTER — Telehealth: Payer: Self-pay | Admitting: *Deleted

## 2023-09-21 NOTE — Telephone Encounter (Signed)
   Pre-operative Risk Assessment    Patient Name: Shawn [name]  DOB: 1961-09-21 MRN: [id number]   DATE OF LAST VISIT: 04/27/23 DR. CHANDRASEKHAR DATE OF NEXT VISIT: NONE  Request for Surgical Clearance    Procedure:   L3-SIDECOMPRESSION, FUSION, TLIF  Date of Surgery:  Clearance TBD                                 Surgeon:  DR. Malachy Chamber Surgeon's Group or Practice Name:  [facility] Phone number:  [phone] Fax number:  [phone]   Type of Clearance Requested:   - Medical ; PT IS NOT ON ANY BLOOD THINNERS; HOWEVER THE CLEARANCE FORM IS ASKING ABOUT METOPROLOL    Type of Anesthesia:  General    Additional requests/questions:    Elpidio Anis   09/21/2023, 5:09 PM

## 2023-09-22 ENCOUNTER — Telehealth: Payer: Self-pay | Admitting: *Deleted

## 2023-09-22 NOTE — Telephone Encounter (Signed)
   Name: Shawn [name]  DOB: [birth year]  MRN: [id number]  Primary Cardiologist: Christell Constant, MD   Preoperative team, please contact this patient and set up a phone call appointment for further preoperative risk assessment. Please obtain consent and complete medication review. Thank you for your help.  I confirm that guidance regarding antiplatelet and oral anticoagulation therapy has been completed and, if necessary, noted below.. Not on antiplatelet therapy or anticoagulation therapy.   Needs recommendations on holding metoprolol Takes this for PVC's.   I also confirmed the patient resides in the state of [place]. As per [facility] [facility] Medical Board telemedicine laws, the patient must reside in the state in which the provider is licensed.   Joni Reining, NP 09/22/2023, 11:45 AM [facility]

## 2023-09-22 NOTE — Telephone Encounter (Signed)
Pt has been scheduled for a tele visit, 10/06/23 9:40.  Consent on file / medications reconciled.    Patient Consent for Virtual Visit        Shawn [name] has provided verbal consent on 09/22/2023 for a virtual visit (video or telephone).   CONSENT FOR VIRTUAL VISIT FOR:  Shawn [name]  By participating in this virtual visit I agree to the following:  I hereby voluntarily request, consent and authorize [facility] and its employed or contracted physicians, physician assistants, nurse practitioners or other licensed health care professionals (the Practitioner), to provide me with telemedicine health care services (the "Services") as deemed necessary by the treating Practitioner. I acknowledge and consent to receive the Services by the Practitioner via telemedicine. I understand that the telemedicine visit will involve communicating with the Practitioner through live audiovisual communication technology and the disclosure of certain medical information by electronic transmission. I acknowledge that I have been given the opportunity to request an in-person assessment or other available alternative prior to the telemedicine visit and am voluntarily participating in the telemedicine visit.  I understand that I have the right to withhold or withdraw my consent to the use of telemedicine in the course of my care at any time, without affecting my right to future care or treatment, and that the Practitioner or I may terminate the telemedicine visit at any time. I understand that I have the right to inspect all information obtained and/or recorded in the course of the telemedicine visit and may receive copies of available information for a reasonable fee.  I understand that some of the potential risks of receiving the Services via telemedicine include:  Delay or interruption in medical evaluation due to technological equipment failure or disruption; Information transmitted may not be sufficient  (e.g. poor resolution of images) to allow for appropriate medical decision making by the Practitioner; and/or  In rare instances, security protocols could fail, causing a breach of personal health information.  Furthermore, I acknowledge that it is my responsibility to provide information about my medical history, conditions and care that is complete and accurate to the best of my ability. I acknowledge that Practitioner's advice, recommendations, and/or decision may be based on factors not within their control, such as incomplete or inaccurate data provided by me or distortions of diagnostic images or specimens that may result from electronic transmissions. I understand that the practice of medicine is not an exact science and that Practitioner makes no warranties or guarantees regarding treatment outcomes. I acknowledge that a copy of this consent can be made available to me via my patient portal [facility] MyChart), or I can request a printed copy by calling the office of [facility].    I understand that my insurance will be billed for this visit.   I have read or had this consent read to me. I understand the contents of this consent, which adequately explains the benefits and risks of the Services being provided via telemedicine.  I have been provided ample opportunity to ask questions regarding this consent and the Services and have had my questions answered to my satisfaction. I give my informed consent for the services to be provided through the use of telemedicine in my medical care

## 2023-09-22 NOTE — Telephone Encounter (Signed)
Pt has been scheduled for a tele visit, 10/06/23 9:40.  Consent on file / medications reconciled.

## 2023-09-25 NOTE — Telephone Encounter (Signed)
PT HAS TELE APPT FOR PRE OP CLEARANCE 10/06/23. ONCE THE PT HAS BEEN CLEARED WE WILL FAX CLEARANCE NOTES.

## 2023-09-29 DIAGNOSIS — L089 Local infection of the skin and subcutaneous tissue, unspecified: Secondary | ICD-10-CM | POA: Diagnosis not present

## 2023-10-06 ENCOUNTER — Ambulatory Visit: Payer: BC Managed Care – PPO | Attending: Cardiology

## 2023-10-06 DIAGNOSIS — Z01810 Encounter for preprocedural cardiovascular examination: Secondary | ICD-10-CM | POA: Diagnosis not present

## 2023-10-06 NOTE — Progress Notes (Addendum)
Virtual Visit via Telephone Note   Because of Shawn [name]'s co-morbid illnesses, he is at least at moderate risk for complications without adequate follow up.  This format is felt to be most appropriate for this patient at this time.  The patient did not have access to video technology/had technical difficulties with video requiring transitioning to audio format only (telephone).  All issues noted in this document were discussed and addressed.  No physical exam could be performed with this format.  Please refer to the patient's chart for his consent to telehealth for [facility].  Evaluation Performed:  Preoperative cardiovascular risk assessment _____________   Date:  10/06/2023   Patient ID:  Shawn [name], DOB 17-Mar-1961, MRN [id number] Patient Location:  Home Provider location:   Office  Primary Care Provider:  Penelope Galas, MD Primary Cardiologist:  Christell Constant, MD  Chief Complaint / Patient Profile   62 y.o. y/o male with a h/o hypertension, atrial tachycardia, PVCs, near syncope, GERD, hyponatremia who is pending L3-S1 decompression, fusion, TLIF, Dr. Malachy Chamber and presents today for telephonic preoperative cardiovascular risk assessment.  History of Present Illness    Shawn [name] is a 62 y.o. male who presents via audio/video conferencing for a telehealth visit today.  Pt was last seen in cardiology clinic on 04/27/2023 by Dr. Izora Ribas.  At that time Shawn [name] was doing well .  The patient is now pending procedure as outlined above. Since his last visit, he he remains stable from a cardiac standpoint.  Today he denies chest pain, shortness of breath, lower extremity edema, fatigue, palpitations, melena, hematuria, hemoptysis, diaphoresis, weakness, presyncope, syncope, orthopnea, and PND.    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-followed by Dr Jason Fila   Multiple thyroid nodules    Onychomycosis    treated with lamisil in the past   Other chronic pain    Pain in right hand    Palpitations    Prediabetes    Pruritus    Retinal detachment    right eye caused blindness   Tinea cruris    Tinnitus    Total retinal detachment of right eye    Past Surgical History:  Procedure Laterality Date   APPENDECTOMY     COLONOSCOPY     COLONOSCOPY WITH ESOPHAGOGASTRODUODENOSCOPY (EGD)  02/23/2023   CYSTOSCOPY W/ URETERAL STENT PLACEMENT  08/24/2012   Procedure: CYSTOSCOPY WITH RETROGRADE PYELOGRAM/URETERAL STENT PLACEMENT;  Surgeon: Kathi Ludwig, MD;  Location: [facility];  Service: Urology;  Laterality: Right;   CYSTOSCOPY/RETROGRADE/URETEROSCOPY  08/24/2012   Procedure: CYSTOSCOPY/RETROGRADE/URETEROSCOPY;  Surgeon: Kathi Ludwig, MD;  Location: [facility];  Service: Urology;  Laterality: Right;  Stone Basketing   CYSTOSCOPY/RETROGRADE/URETEROSCOPY/STONE EXTRACTION WITH BASKET Left 04/11/2013   Procedure: CYSTOSCOPY/RETROGRADE/URETEROSCOPY/STONE EXTRACTION WITH BASKET;  Surgeon: Kathi Ludwig, MD;  Location: [facility];  Service: Urology;  Laterality: Left;  BASKET STONE EXTRACTION WITH URETEROSCOPE     Right retina detachment      Allergies  No Known Allergies  Home Medications    Prior to Admission medications   Medication Sig Start Date End Date Taking? Authorizing Provider  gabapentin (NEURONTIN) 300 MG capsule Take by mouth.  Take 1 tab in the morning and 2 tabs in the evening 01/22/22   [provider]  lisinopril (ZESTRIL) 20 MG tablet Take 1 tablet (20 mg total) by mouth daily. 08/31/23   Riley Lam A, MD  metoprolol succinate (TOPROL-XL) 50 MG 24 hr tablet Take 1 tablet (50 mg total) by mouth daily. 10/24/22   Chandrasekhar, Rondel Jumbo, MD  mupirocin ointment (BACTROBAN) 2 % 1 Application 2 (two) times daily.    [provider]  pantoprazole (PROTONIX) 20 MG tablet Take 1 tablet (20 mg total) by mouth daily. 05/26/23   Nandigam, Eleonore Chiquito, MD  SYRINGE-NEEDLE, DISP, 3 ML (B-D 3CC LUER-LOK SYR 21GX1-1/2) 21G X 1-1/2" 3 ML MISC USE AS DIRECTED FOR TESTOSTERONE ADMINISTRATION 07/18/22   Stoioff, Verna Czech, MD  testosterone cypionate (DEPOTESTOSTERONE CYPIONATE) 200 MG/ML injection INJECT 1.5 MLS INTO THE MUSCLE EVERY 14 DAYS. 06/04/23   Riki Altes, MD    Physical Exam    Vital Signs:  Shawn [name] does not have vital signs available for review today.  Given telephonic nature of communication, physical exam is limited. AAOx3. NAD. Normal affect.  Speech and respirations are unlabored.  Accessory Clinical Findings    None  Assessment & Plan    1.  Preoperative Cardiovascular Risk Assessment:L3-S1 decompression, fusion, TLIF, Dr. Malachy Chamber, fax number [phone]      Primary Cardiologist: Christell Constant, MD  Chart reviewed as part of pre-operative protocol coverage. Given past medical history and time since last visit, based on ACC/AHA guidelines, Shawn [name] would be at acceptable risk for the planned procedure without further cardiovascular testing.   His RCRI is very low risk, 0.4% risk of major cardiac event.  He is able to complete greater than 4 METS of physical activity.  If needed his metoprolol may be held for 1 day prior to his procedure.  Please resume as soon as possible after surgery.  Patient was advised that if he/she develops new symptoms prior to  surgery to contact our office to arrange a follow-up appointment.  He verbalized understanding.  I will route this recommendation to the requesting party via Epic fax function and remove from pre-op pool.     Time:   Today, I have spent 9 minutes with the patient with telehealth technology discussing medical history, symptoms, and management plan.  Prior to patient's phone evaluation I spent greater than 10 minutes reviewing their past medical history and cardiac medications.    Ronney Asters, NP  10/06/2023, 7:10 AM

## 2023-10-14 ENCOUNTER — Other Ambulatory Visit: Payer: Self-pay | Admitting: Urology

## 2023-10-14 DIAGNOSIS — H9193 Unspecified hearing loss, bilateral: Secondary | ICD-10-CM | POA: Diagnosis not present

## 2023-10-14 DIAGNOSIS — H903 Sensorineural hearing loss, bilateral: Secondary | ICD-10-CM | POA: Diagnosis not present

## 2023-10-14 DIAGNOSIS — Z974 Presence of external hearing-aid: Secondary | ICD-10-CM | POA: Diagnosis not present

## 2023-10-14 DIAGNOSIS — Z461 Encounter for fitting and adjustment of hearing aid: Secondary | ICD-10-CM | POA: Diagnosis not present

## 2023-10-14 DIAGNOSIS — Z0110 Encounter for examination of ears and hearing without abnormal findings: Secondary | ICD-10-CM | POA: Diagnosis not present

## 2023-10-14 DIAGNOSIS — H8101 Meniere's disease, right ear: Secondary | ICD-10-CM | POA: Diagnosis not present

## 2023-10-14 DIAGNOSIS — H9312 Tinnitus, left ear: Secondary | ICD-10-CM | POA: Diagnosis not present

## 2023-10-16 ENCOUNTER — Other Ambulatory Visit: Payer: Self-pay | Admitting: Gastroenterology

## 2023-10-22 ENCOUNTER — Encounter (HOSPITAL_BASED_OUTPATIENT_CLINIC_OR_DEPARTMENT_OTHER): Payer: BC Managed Care – PPO | Attending: General Surgery | Admitting: General Surgery

## 2023-10-22 DIAGNOSIS — L97812 Non-pressure chronic ulcer of other part of right lower leg with fat layer exposed: Secondary | ICD-10-CM | POA: Diagnosis not present

## 2023-10-22 DIAGNOSIS — I872 Venous insufficiency (chronic) (peripheral): Secondary | ICD-10-CM | POA: Diagnosis not present

## 2023-10-22 DIAGNOSIS — I10 Essential (primary) hypertension: Secondary | ICD-10-CM | POA: Insufficient documentation

## 2023-10-22 DIAGNOSIS — D649 Anemia, unspecified: Secondary | ICD-10-CM | POA: Insufficient documentation

## 2023-10-22 NOTE — Progress Notes (Addendum)
[place], Shawn [name] ([id number]) [id number] Page 1 of 8 Visit Report for 10/22/2023 Allergy List Details Patient Name: Date of Service: Shawn [name] 10/22/2023 8:00 A M Medical Record Number: [id number] Patient Account Number: [id number] Date of Birth/Sex: Treating RN: [birth year] (62 y.o. Shawn [name] Primary Care Shawn [name]: Shawn [name] Other Clinician: Referring Shawn [name]: Treating Shawn [name]/Extender: Mallie Mussel in Treatment: 0 Allergies Active Allergies No Known Allergies Allergy Notes Electronic Signature(s) Signed: 10/22/2023 3:21:42 PM By: Shawn [name] Entered By: Shawn [name] on 10/22/2023 05:21:04 -------------------------------------------------------------------------------- Arrival Information Details Patient Name: Date of Service: Shawn [name], Shawn [name] 10/22/2023 8:00 A M Medical Record Number: [id number] Patient Account Number: [id number] Date of Birth/Sex: Treating RN: [birth year] (62 y.o. M) Primary Care Shawn [name]: Shawn [name] Other Clinician: Referring Shawn [name]: Treating Shawn [name]/Extender: Mallie Mussel in Treatment: 0 Visit Information Patient Arrived: Ambulatory Arrival Time: 07:59 Accompanied By: wife Transfer Assistance: None Patient Identification Verified: Yes Secondary Verification Process Completed: Yes Electronic Signature(s) Signed: 10/22/2023 8:16:40 AM By: Shawn [name] Entered By: Shawn [name] on 10/22/2023 04:59:56 -------------------------------------------------------------------------------- Clinic Level of Care Assessment Details Patient Name: Date of Service: Shawn [name] 10/22/2023 8:00 A M Medical Record Number: [id number] Patient Account Number: [id number] Date of Birth/Sex: Treating RN: September 24, 1961 (62 y.o. Shawn [name] Primary Care Shawn [name]: Shawn [name] Other Clinician: Marchia [name] ([id number]) [id number] Page 2 of  8 Referring Shawn [name]: Treating Shawn [name]/Extender: Mallie Mussel in Treatment: 0 Clinic Level of Care Assessment Items TOOL 1 Quantity Score X- 1 0 Use when EandM and Procedure is performed on INITIAL visit ASSESSMENTS - Nursing Assessment / Reassessment X- 1 20 General Physical Exam (combine w/ comprehensive assessment (listed just below) when performed on new pt. evals) X- 1 25 Comprehensive Assessment (HX, ROS, Risk Assessments, Wounds Hx, etc.) ASSESSMENTS - Wound and Skin Assessment / Reassessment []  - 0 Dermatologic / Skin Assessment (not related to wound area) ASSESSMENTS - Ostomy and/or Continence Assessment and Care []  - 0 Incontinence Assessment and Management []  - 0 Ostomy Care Assessment and Management (repouching, etc.) PROCESS - Coordination of Care X - Simple Patient / Family Education for ongoing care 1 15 []  - 0 Complex (extensive) Patient / Family Education for ongoing care X- 1 10 Staff obtains Chiropractor, Records, T Results / Process Orders est X- 1 10 Staff telephones HHA, Nursing Homes / Clarify orders / etc []  - 0 Routine Transfer to another Facility (non-emergent condition) []  - 0 Routine Hospital Admission (non-emergent condition) X- 1 15 New Admissions / Manufacturing engineer / Ordering NPWT Apligraf, etc. , []  - 0 Emergency Hospital Admission (emergent condition) PROCESS - Special Needs []  - 0 Pediatric / Minor Patient Management []  - 0 Isolation Patient Management []  - 0 Hearing / Language / Visual special needs []  - 0 Assessment of Community assistance (transportation, D/C planning, etc.) []  - 0 Additional assistance / Altered mentation []  - 0 Support Surface(s) Assessment (bed, cushion, seat, etc.) INTERVENTIONS - Miscellaneous []  - 0 External ear exam []  - 0 Patient Transfer (multiple staff / Nurse, adult / Similar devices) []  - 0 Simple Staple / Suture removal (25 or less) []  - 0 Complex Staple / Suture  removal (26 or more) []  - 0 Hypo/Hyperglycemic Management (do not check if billed separately) X- 1 15 Ankle / Brachial Index (ABI) - do not check if billed separately Has the patient been seen at the hospital within the last three years: Yes Total Score: 110 Level Of Care:  New/Established - Level 3 Electronic Signature(s) Signed: 10/28/2023 2:35:12 PM By: Shawn Ard RN Entered By: Shawn [name] on 10/22/2023 06:03:01 Encounter Discharge Information Details -------------------------------------------------------------------------------- Shawn [name] ([id number]) [id number] Page 3 of 8 Patient Name: Date of Service: Shawn [name] 10/22/2023 8:00 A M Medical Record Number: [id number] Patient Account Number: [id number] Date of Birth/Sex: Treating RN: [birth year] (62 y.o. Shawn [name] Primary Care Tiona Ruane: Shawn [name] Other Clinician: Referring Marabelle [name]: Treating Shawn [name]/Extender: Mallie Mussel in Treatment: 0 Encounter Discharge Information Items Post Procedure Vitals Discharge Condition: Stable Temperature (F): 98.1 Ambulatory Status: Ambulatory Pulse (bpm): 75 Discharge Destination: Home Respiratory Rate (breaths/min): 18 Transportation: Private Auto Blood Pressure (mmHg): 118/75 Accompanied By: spouse Schedule Follow-up Appointment: Yes Clinical Summary of Care: Electronic Signature(s) Signed: 10/22/2023 9:22:15 AM By: Shawn Ard RN Entered By: Shawn [name] on 10/22/2023 06:22:15 -------------------------------------------------------------------------------- Lower Extremity Assessment Details Patient Name: Date of Service: Shawn [name] [name] 10/22/2023 8:00 A M Medical Record Number: [id number] Patient Account Number: [id number] Date of Birth/Sex: Treating RN: 24-Sep-1961 (62 y.o. Shawn [name] Primary Care Shawn [name]: Shawn [name] Other Clinician: Referring Shawn [name]: Treating Shawn [name]/Extender: Mallie Mussel in Treatment: 0 Edema Assessment Assessed: Shawn [name]: No] [Right: No] [Left: Edema] [Right: :] Calf Left: Right: Point of Measurement: From Medial Instep 39.2 cm Ankle Left: Right: Point of Measurement: From Medial Instep 24.5 cm Knee To Floor Left: Right: From Medial Instep 44 cm Vascular Assessment Pulses: Dorsalis Pedis Palpable: [Right:Yes] Doppler Audible: [Right:Yes] Extremity colors, hair growth, and conditions: Extremity Color: [Right:Normal] Hair Growth on Extremity: [Right:Yes] Temperature of Extremity: [Right:Warm] Capillary Refill: [Right:< 3 seconds] Dependent Rubor: [Right:No] Blanched when Elevated: [Right:No] Lipodermatosclerosis: [Right:No] Blood Pressure: Brachial: [Right:118] Ankle: [Right:Dorsalis Pedis: 140] Ankle Brachial Index: [Right:1.19 [id number] Page 4 of 8] Electronic Signature(s) Signed: 10/22/2023 3:21:42 PM By: Shawn [name] Entered By: Shawn [name] on 10/22/2023 05:38:11 -------------------------------------------------------------------------------- Multi Wound Chart Details Patient Name: Date of Service: Shawn [name], Shawn [name] 10/22/2023 8:00 A M Medical Record Number: [id number] Patient Account Number: [id number] Date of Birth/Sex: Treating RN: 01/08/1961 (62 y.o. M) Primary Care Jazlene Bares: Shawn [name] Other Clinician: Referring Ivannah Zody: Treating Lachele Lievanos/Extender: Mallie Mussel in Treatment: 0 Vital Signs Height(in): 70 Pulse(bpm): 75 Weight(lbs): 204 Blood Pressure(mmHg): 118/75 Body Mass Index(BMI): 29.3 Temperature(F): 98.1 Respiratory Rate(breaths/min): 18 [1:Photos:] [N/A:N/A] Right Lower Leg N/A N/A Wound Location: Not Known N/A N/A Wounding Event: T be determined o N/A N/A Primary Etiology: Anemia, Angina, Arrhythmia, N/A N/A Comorbid History: Hypertension, Neuropathy 07/29/2023 N/A N/A Date Acquired: 0 N/A N/A Weeks of  Treatment: Open N/A N/A Wound Status: No N/A N/A Wound Recurrence: 1.3x1.9x0.1 N/A N/A Measurements L x W x D (cm) 1.94 N/A N/A A (cm) : rea 0.194 N/A N/A Volume (cm) : Full Thickness Without Exposed N/A N/A Classification: Support Structures Medium N/A N/A Exudate A mount: Serosanguineous N/A N/A Exudate Type: [name], brown N/A N/A Exudate Color: Distinct, outline attached N/A N/A Wound Margin: Small (1-33%) N/A N/A Granulation A mount: [name] N/A N/A Granulation Quality: Large (67-100%) N/A N/A Necrotic A mount: Eschar, Adherent Slough N/A N/A Necrotic Tissue: Fat Layer (Subcutaneous Tissue): Yes N/A N/A Exposed Structures: Fascia: No Tendon: No Muscle: No Joint: No Bone: No None N/A N/A Epithelialization: Debridement - Excisional N/A N/A Debridement: Pre-procedure Verification/Time Out 08:50 N/A N/A Taken: Subcutaneous, Slough N/A N/A Tissue Debrided: Skin/Subcutaneous Tissue N/A N/A Level: 1.94 N/A N/A Debridement A (sq cm): rea Curette N/A N/A Instrument: Minimum N/A N/A Bleeding: Procedure was tolerated  well N/A N/A Debridement Treatment Response: 1.3x1.9x0.1 N/A N/A Post Debridement Measurements L x W x D (cm) [place], Shawn [name] ([id number]) [id number] Page 5 of 8 0.194 N/A N/A Post Debridement Volume: (cm) No Abnormalities Noted N/A N/A Periwound Skin Texture: No Abnormalities Noted N/A N/A Periwound Skin Moisture: No Abnormalities Noted N/A N/A Periwound Skin Color: No Abnormality N/A N/A Temperature: Debridement N/A N/A Procedures Performed: Treatment Notes Electronic Signature(s) Signed: 10/22/2023 9:05:50 AM By: Duanne Guess MD FACS Previous Signature: 10/22/2023 8:29:27 AM Version By: Duanne Guess MD FACS Entered By: Duanne Guess on 10/22/2023 06:05:50 -------------------------------------------------------------------------------- Multi-Disciplinary Care Plan Details Patient Name: Date of  Service: Shawn [name], Shawn [name] 10/22/2023 8:00 A M Medical Record Number: [id number] Patient Account Number: [id number] Date of Birth/Sex: Treating RN: 1961/05/24 (62 y.o. Shawn [name] Primary Care Mekhi Lascola: Shawn [name] Other Clinician: Referring Aricka Goldberger: Treating Lolah Coghlan/Extender: Mallie Mussel in Treatment: 0 Active Inactive Electronic Signature(s) Signed: 10/28/2023 2:35:12 PM By: Shawn Ard RN Entered By: Shawn [name] on 10/22/2023 06:01:25 -------------------------------------------------------------------------------- Pain Assessment Details Patient Name: Date of Service: Shawn [name], Shawn [name] 10/22/2023 8:00 A M Medical Record Number: [id number] Patient Account Number: [id number] Date of Birth/Sex: Treating RN: [birth year] (62 y.o. M) Primary Care Aarron Wierzbicki: Shawn [name] Other Clinician: Referring Ady Heimann: Treating Shae Hinnenkamp/Extender: Mallie Mussel in Treatment: 0 Active Problems Location of Pain Severity and Description of Pain Patient Has Paino Yes Site Locations Rate the pain. [place], Shawn [name] ([id number]) [id number] Page 6 of 8 Rate the pain. Current Pain Level: 2 Worst Pain Level: 10 Least Pain Level: 0 Tolerable Pain Level: 1 Character of Pain Describe the Pain: Burning, Tender Pain Management and Medication Current Pain Management: Electronic Signature(s) Signed: 10/22/2023 8:16:40 AM By: Shawn [name] Entered By: Shawn [name] on 10/22/2023 05:04:10 -------------------------------------------------------------------------------- Patient/Caregiver Education Details Patient Name: Date of Service: Shawn [name], Shawn [name] 11/7/2024andnbsp8:00 A M Medical Record Number: [id number] Patient Account Number: [id number] Date of Birth/Gender: Treating RN: Mar 19, 1961 (62 y.o. Shawn [name] Primary Care Physician: Shawn [name] Other Clinician: Referring Physician: Treating Physician/Extender: Mallie Mussel in Treatment: 0 Education Assessment Education Provided To: Patient Education Topics Provided Welcome T [facility]New [facility] Packet: o Handouts: [facility] P[facility] form, Welcome T [facility] o Methods: Explain/Verbal Responses: Reinforcements needed, State content correctly Wound Debridement: Handouts: Wound Debridement Methods: Explain/Verbal Responses: Reinforcements needed, State content correctly Wound/Skin Impairment: Handouts: Caring for Your Ulcer Methods: Explain/Verbal Responses: Reinforcements needed, State content correctly Electronic Signature(s) Signed: 10/28/2023 2:35:12 PM By: Shawn Ard RN Entered By: Shawn [name] on 10/22/2023 06:02:06 Shawn [name] ([id number]) [id number] Page 7 of 8 -------------------------------------------------------------------------------- Wound Assessment Details Patient Name: Date of Service: Shawn [name] 10/22/2023 8:00 A M Medical Record Number: [id number] Patient Account Number: [id number] Date of Birth/Sex: Treating RN: 06-Jul-1961 (62 y.o. Shawn [name] Primary Care Patria Warzecha: Shawn [name] Other Clinician: Referring Mate Alegria: Treating Tyja Gortney/Extender: Mallie Mussel in Treatment: 0 Wound Status Wound Number: 1 Primary Etiology: Vasculitis Wound Location: Right, Medial Lower Leg Wound Status: Open Wounding Event: Not Known Comorbid History: Anemia, Angina, Arrhythmia, Hypertension, Neuropathy Date Acquired: 07/29/2023 Weeks Of Treatment: 0 Clustered Wound: No Photos Wound Measurements Length: (cm) 1.3 Width: (cm) 1.9 Depth: (cm) 0.1 Area: (cm) 1.94 Volume: (cm) 0.194 % Reduction in Area: % Reduction in Volume: Epithelialization: None Tunneling: No Undermining: No Wound Description Classification: Full Thickness Without Exposed Support Wound Margin: Distinct, outline attached Exudate Amount:  Medium Exudate Type: Serosanguineous  Exudate Color: [name], brown Structures Foul Odor After Cleansing: No Slough/Fibrino Yes Wound Bed Granulation Amount: Small (1-33%) Exposed Structure Granulation Quality: [name] Fascia Exposed: No Necrotic Amount: Large (67-100%) Fat Layer (Subcutaneous Tissue) Exposed: Yes Necrotic Quality: Eschar Tendon Exposed: No Muscle Exposed: No Joint Exposed: No Bone Exposed: No Periwound Skin Texture Texture Color No Abnormalities Noted: Yes No Abnormalities Noted: Yes Moisture Temperature / Pain No Abnormalities Noted: Yes Temperature: No Abnormality Electronic Signature(s) Signed: 10/28/2023 2:35:12 PM By: Shawn Ard RN Previous Signature: 10/22/2023 8:16:40 AM Version By: Shawn [name] Entered By: Shawn [name] on 10/22/2023 06:17:23 Shawn [name] ([id number]) [id number] Page 8 of 8 -------------------------------------------------------------------------------- Vitals Details Patient Name: Date of Service: Shawn [name] 10/22/2023 8:00 A M Medical Record Number: [id number] Patient Account Number: [id number] Date of Birth/Sex: Treating RN: [birth year] (62 y.o. M) Primary Care Christyana Corwin: Shawn [name] Other Clinician: Referring Nateisha Moyd: Treating Shambria Camerer/Extender: Mallie Mussel in Treatment: 0 Vital Signs Time Taken: 08:03 Temperature (F): 98.1 Height (in): 70 Pulse (bpm): 75 Weight (lbs): 204 Respiratory Rate (breaths/min): 18 Body Mass Index (BMI): 29.3 Blood Pressure (mmHg): 118/75 Reference Range: 80 - 120 mg / dl Electronic Signature(s) Signed: 10/22/2023 8:16:40 AM By: Shawn [name] Entered By: Shawn [name] on 10/22/2023 05:03:30

## 2023-10-22 NOTE — Progress Notes (Addendum)
[place], [name] ([id number]) [id number] Page 1 of 9 Visit Report for 10/22/2023 Chief Complaint Document Details Patient Name: Date of Service: Shawn [name] 10/22/2023 8:00 A M Medical Record Number: [id number] Patient Account Number: [id number] Date of Birth/Sex: Treating RN: 1961-07-29 (62 y.o. M) Primary Care Provider: Effie Shy Other Clinician: Referring Provider: Treating Provider/Extender: Mallie Mussel in Treatment: 0 Information Obtained from: Patient Chief Complaint Patient presents to the wound care center with open non-healing surgical wound(s) in the setting of venous insufficiency Electronic Signature(s) Signed: 10/22/2023 9:05:59 AM By: Duanne Guess MD FACS Previous Signature: 10/22/2023 8:29:46 AM Version By: Duanne Guess MD FACS Entered By: Duanne Guess on 10/22/2023 06:05:59 -------------------------------------------------------------------------------- Debridement Details Patient Name: Date of Service: Shawn [name], Shawn [name] 10/22/2023 8:00 A M Medical Record Number: [id number] Patient Account Number: [id number] Date of Birth/Sex: Treating RN: 04/30/1961 (62 y.o. M) Primary Care Provider: Effie Shy Other Clinician: Referring Provider: Treating Provider/Extender: Mallie Mussel in Treatment: 0 Debridement Performed for Assessment: Wound #1 Right,Medial Lower Leg Performed By: Physician Duanne Guess, MD The following information was scribed by: Tommie Ard The information was scribed for: Duanne Guess Debridement Type: Debridement Severity of Tissue Pre Debridement: Fat layer exposed Level of Consciousness (Pre-procedure): Awake and Alert Pre-procedure Verification/Time Out Yes - 08:50 Taken: Start Time: 08:51 Percent of Wound Bed Debrided: 100% T Area Debrided (cm): otal 1.94 Tissue and other material debrided: Viable, Non-Viable, Eschar, Slough, Subcutaneous,  Slough Level: Skin/Subcutaneous Tissue Debridement Description: Excisional Instrument: Curette Bleeding: Minimum Response to Treatment: Procedure was tolerated well Level of Consciousness (Post- Awake and Alert procedure): Post Debridement Measurements of Total Wound Length: (cm) 1.3 Width: (cm) 1.9 Depth: (cm) 0.1 Volume: (cm) 0.194 Character of Wound/Ulcer Post Debridement: Requires Further Debridement Severity of Tissue Post Debridement: Fat layer exposed [name] ([id number]) [id number] Page 2 of 9 Post Procedure Diagnosis Same as Pre-procedure Electronic Signature(s) Signed: 10/22/2023 9:32:49 AM By: Duanne Guess MD FACS Entered By: Duanne Guess on 10/22/2023 06:32:49 -------------------------------------------------------------------------------- HPI Details Patient Name: Date of Service: Shawn [name], Shawn [name] 10/22/2023 8:00 A M Medical Record Number: [id number] Patient Account Number: [id number] Date of Birth/Sex: Treating RN: 09/15/1961 (62 y.o. M) Primary Care Provider: Effie Shy Other Clinician: Referring Provider: Treating Provider/Extender: Mallie Mussel in Treatment: 0 History of Present Illness HPI Description: ADMISSION 10/22/2023 ***ABIs R: 1.19*** This is a 61 year old nondiabetic presenting to clinic with an ulcer on his right lower leg. He had a skin biopsy performed in this location to try to determine the etiology of a rash and the wound failed to heal. He does have venous reflux based upon study performed in July of this year. He was seen by vascular surgery after the study was performed. Reading the vascular surgery note, the rash appeared typical of vasculitis to the provider. He did not think treating the chronic venous insufficiency would make his rash go away as compression and elevation had actually exacerbated the rash. The patient was encouraged to get a second opinion from a  rheumatologist at an academic center and nothing further was done. He has his appointment with an [facility] provider tomorrow. He saw his PCP on September 29, 2023 and was given a 10-day course of Bactrim. He was also referred to the wound care center at that time. Electronic Signature(s) Signed: 10/22/2023 10:38:48 AM By: Duanne Guess MD FACS Previous Signature: 10/22/2023 9:07:25 AM Version By: Duanne Guess MD FACS Previous Signature: 10/22/2023 8:34:13 AM Version  By: Duanne Guess MD FACS Entered By: Duanne Guess on 10/22/2023 07:38:48 -------------------------------------------------------------------------------- Physical Exam Details Patient Name: Date of Service: Shawn [name] 10/22/2023 8:00 A M Medical Record Number: [id number] Patient Account Number: [id number] Date of Birth/Sex: Treating RN: Apr 29, 1961 (62 y.o. M) Primary Care Provider: Effie Shy Other Clinician: Referring Provider: Treating Provider/Extender: Mallie Mussel in Treatment: 0 Constitutional . . . . No acute distress. Respiratory Normal work of breathing on room air. Cardiovascular 2+ pitting edema. Integumentary (Hair, Skin) reticular rash on BLE. Notes 10/22/2023: On his lower medial right leg, there is an irregular wedge-shaped wound with a layer of dry eschar and slough covering it. [place], [name] ([id number]) [id number] Page 3 of 9 Electronic Signature(s) Signed: 10/22/2023 9:29:33 AM By: Duanne Guess MD FACS Entered By: Duanne Guess on 10/22/2023 06:29:33 -------------------------------------------------------------------------------- Physician Orders Details Patient Name: Date of Service: Shawn [name], Shawn [name] 10/22/2023 8:00 A M Medical Record Number: [id number] Patient Account Number: [id number] Date of Birth/Sex: Treating RN: [birth year] (62 y.o. Valma Cava Primary Care Provider: Effie Shy Other  Clinician: Referring Provider: Treating Provider/Extender: Mallie Mussel in Treatment: 0 Verbal / Phone Orders: No Diagnosis Coding ICD-10 Coding Code Description [phone] Non-pressure chronic ulcer of other part of right lower leg with fat layer exposed I87.2 Venous insufficiency (chronic) (peripheral) Follow-up Appointments ppointment in 1 week. - Dr. Lady Cabot Return A Anesthetic Wound #1 Right,Medial Lower Leg (In clinic) Topical Lidocaine 5% applied to wound bed - prior to debridement Bathing/ Shower/ Hygiene May shower with protection but do not get wound dressing(s) wet. Protect dressing(s) with water repellant cover (for example, large plastic bag) or a cast cover and may then take shower. Edema Control - Orders / Instructions Bilateral Lower Extremities Segmental Compressive Device. Use the Segmental Compressive Device on leg(s) 2-3 times a day for 45 - 60 minutes. If wearing any wraps or hose, do not remove them. Continue exercising as instructed. Elevate legs to the level of the heart or above for 30 minutes daily and/or when sitting for 3-4 times a day throughout the day. Avoid standing for long periods of time. Wound Treatment Wound #1 - Lower Leg Wound Laterality: Right, Medial Cleanser: Soap and Water 1 x Per Day/15 Days Discharge Instructions: May shower and wash wound with dial antibacterial soap and water prior to dressing change. Cleanser: Vashe 5.8 (oz) 1 x Per Day/15 Days Discharge Instructions: Cleanse the wound with Vashe prior to applying a clean dressing using gauze sponges, not tissue or cotton balls. Peri-Wound Care: Triamcinolone 15 (g) 1 x Per Day/15 Days Discharge Instructions: Use triamcinolone 15 (g)directly on to wound bed Secondary Dressing: Zetuvit Plus Silicone Border Dressing 3x3 (in/in) (DME) (Generic) 1 x Per Day/15 Days Discharge Instructions: Apply silicone border over primary dressing as directed. Electronic  Signature(s) Signed: 10/22/2023 2:17:41 PM By: Duanne Guess MD FACS Entered By: Duanne Guess on 10/22/2023 06:29:57 [name] ([id number]) [id number] Page 4 of 9 -------------------------------------------------------------------------------- Problem List Details Patient Name: Date of Service: Shawn [name] 10/22/2023 8:00 A M Medical Record Number: [id number] Patient Account Number: [id number] Date of Birth/Sex: Treating RN: 11-Jul-1961 (62 y.o. M) Primary Care Provider: Effie Shy Other Clinician: Referring Provider: Treating Provider/Extender: Mallie Mussel in Treatment: 0 Active Problems ICD-10 Encounter Code Description Active Date MDM Diagnosis L97.812 Non-pressure chronic ulcer of other part of right lower leg with fat layer 10/22/2023 No Yes exposed I87.2 Venous insufficiency (chronic) (peripheral) 10/22/2023 No Yes Inactive  Problems Resolved Problems Electronic Signature(s) Signed: 10/22/2023 9:05:42 AM By: Duanne Guess MD FACS Previous Signature: 10/22/2023 8:28:40 AM Version By: Duanne Guess MD FACS Previous Signature: 10/22/2023 8:15:41 AM Version By: Duanne Guess MD FACS Entered By: Duanne Guess on 10/22/2023 06:05:42 -------------------------------------------------------------------------------- Progress Note Details Patient Name: Date of Service: Shawn [name], Shawn [name] 10/22/2023 8:00 A M Medical Record Number: [id number] Patient Account Number: [id number] Date of Birth/Sex: Treating RN: 1960/12/18 (62 y.o. M) Primary Care Provider: Effie Shy Other Clinician: Referring Provider: Treating Provider/Extender: Mallie Mussel in Treatment: 0 Subjective Chief Complaint Information obtained from Patient Patient presents to the wound care center with open non-healing surgical wound(s) in the setting of venous insufficiency History of Present Illness  (HPI) ADMISSION 10/22/2023 ***ABIs R: 1.19*** This is a 62 year old nondiabetic presenting to clinic with an ulcer on his right lower leg. He had a skin biopsy performed in this location to try to determine the etiology of a rash and the wound failed to heal. He does have venous reflux based upon study performed in July of this year. He was seen by vascular surgery after the study was performed. Reading the vascular surgery note, the rash appeared typical of vasculitis to the provider. He did not think treating the chronic venous insufficiency would make his rash go away as compression and elevation had actually exacerbated the rash. The patient was encouraged to get a second opinion from a rheumatologist at an academic center and nothing further was done. He has his appointment with an [facility] provider tomorrow. He saw his PCP on September 29, 2023 and was given a 10-day course of Bactrim. He was also referred to the wound care center at that time. Patient History Allergies No Known Allergies Family History Cancer - Siblings, Diabetes - Mother,Maternal Grandparents, Heart Disease, Hypertension, Stroke - Mother, No family history of Hereditary Spherocytosis, Kidney Disease, Lung Disease, Seizures, Thyroid Problems, Tuberculosis. [place], [name] ([id number]) [id number] Page 5 of 9 Social History Never smoker, Marital Status - Married, Alcohol Use - Moderate, Drug Use - No History, Caffeine Use - Daily. Medical History Hematologic/Lymphatic Patient has history of Anemia Cardiovascular Patient has history of Angina, Arrhythmia - Atrial tachycardia, PVCs, Hypertension Endocrine Denies history of Type II Diabetes Neurologic Patient has history of Neuropathy Hospitalization/Surgery History - Colonoscopy with esophagogastroduodenoscopy. - Cystoscopy/retrograde/ureteroscopy/stone extraction with basket (Left). - Cystoscopy w/ ureteral stent placement. -  Appendectomy. - Colonoscopy. Medical A Surgical History Notes nd Eyes Retinal detachment Ear/Nose/Mouth/Throat Tinnitus Cardiovascular Aortic atherosclerosis, Claudication of both lower extremities Gastrointestinal Anal fissure, GERD Endocrine Multiple thyroid nodules, Acute renal failure Genitourinary ED, Hypogonadism, Kidney stone Integumentary (Skin) Onychomycosis, Tinea cruris Review of Systems (ROS) Constitutional Symptoms (General Health) Denies complaints or symptoms of Fatigue, Fever, Chills, Marked Weight Change. 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 Musculoskeletal Complains or has symptoms of Muscle Weakness. Denies complaints or symptoms of Muscle Pain. Neurologic Denies complaints or symptoms of Numbness/parasthesias. Psychiatric Denies complaints or symptoms of Claustrophobia. Objective Constitutional No acute distress. Vitals Time Taken: 8:03 AM, Height: 70 in, Weight: 204 lbs, BMI: 29.3, Temperature: 98.1 F, Pulse: 75 bpm, Respiratory Rate: 18 breaths/min, Blood Pressure: 118/75 mmHg. Respiratory Normal work of breathing on room air. Cardiovascular 2+ pitting edema. General Notes: 10/22/2023: On his lower medial right leg, there is an irregular wedge-shaped wound with a layer of dry eschar and slough covering  it. Integumentary (Hair, Skin) reticular rash on BLE. Wound #1 status is Open. Original cause of wound was Not Known. The date acquired was: 07/29/2023. The wound is located on the Right,Medial Lower Leg. The wound measures 1.3cm length x 1.9cm width x 0.1cm depth; 1.94cm^2 area and 0.194cm^3 volume. There is Fat Layer (Subcutaneous Tissue) exposed. There is no tunneling or undermining noted. There is a medium amount of serosanguineous drainage noted. The wound margin is distinct  with the outline attached to the wound base. There is small (1-33%) [name] granulation within the wound bed. There is a large (67-100%) amount of necrotic tissue within the wound bed including Eschar. The periwound skin appearance had no abnormalities noted for texture. The periwound skin appearance had no abnormalities noted for moisture. The periwound skin appearance had no abnormalities noted for color. Periwound temperature was noted as No Abnormality. [address] PARDEEP, PAUTZ ([id number]) [id number] Page 6 of 9 Active Problems ICD-10 Non-pressure chronic ulcer of other part of right lower leg with fat layer exposed Venous insufficiency (chronic) (peripheral) Procedures Wound #1 Pre-procedure diagnosis of Wound #1 is a Venous Leg Ulcer located on the Right,Medial Lower Leg .Severity of Tissue Pre Debridement is: Fat layer exposed. There was a Excisional Skin/Subcutaneous Tissue Debridement with a total area of 1.94 sq cm performed by Duanne Guess, MD. With the following instrument(s): Curette to remove Viable and Non-Viable tissue/material. Material removed includes Eschar, Subcutaneous Tissue, and Slough. No specimens were taken. A time out was conducted at 08:50, prior to the start of the procedure. A Minimum amount of bleeding was controlled with N/A. The procedure was tolerated well. Post Debridement Measurements: 1.3cm length x 1.9cm width x 0.1cm depth; 0.194cm^3 volume. Character of Wound/Ulcer Post Debridement requires further debridement. Severity of Tissue Post Debridement is: Fat layer exposed. Post procedure Diagnosis Wound #1: Same as Pre-Procedure Plan Follow-up Appointments: Return Appointment in 1 week. - Dr. Lady Gabreil Anesthetic: Wound #1 Right,Medial Lower Leg: (In clinic) Topical Lidocaine 5% applied to wound bed - prior to debridement Bathing/ Shower/ Hygiene: May shower with protection but do not get wound dressing(s) wet. Protect  dressing(s) with water repellant cover (for example, large plastic bag) or a cast cover and may then take shower. Edema Control - Orders / Instructions: Segmental Compressive Device. Use the Segmental Compressive Device on leg(s) 2-3 times a day for 45 - 60 minutes. If wearing any wraps or hose, do not remove them. Continue exercising as instructed. Elevate legs to the level of the heart or above for 30 minutes daily and/or when sitting for 3-4 times a day throughout the day. Avoid standing for long periods of time. WOUND #1: - Lower Leg Wound Laterality: Right, Medial Cleanser: Soap and Water 1 x Per Day/15 Days Discharge Instructions: May shower and wash wound with dial antibacterial soap and water prior to dressing change. Cleanser: Vashe 5.8 (oz) 1 x Per Day/15 Days Discharge Instructions: Cleanse the wound with Vashe prior to applying a clean dressing using gauze sponges, not tissue or cotton balls. Peri-Wound Care: Triamcinolone 15 (g) 1 x Per Day/15 Days Discharge Instructions: Use triamcinolone 15 (g)directly on to wound bed Secondary Dressing: Zetuvit Plus Silicone Border Dressing 3x3 (in/in) (DME) (Generic) 1 x Per Day/15 Days Discharge Instructions: Apply silicone border over primary dressing as directed. 10/22/2023: This is a 62 year old man with a nonhealing wound after undergoing a biopsy to try and establish the etiology of a reticular rash on his lower extremities. On his lower medial right leg, there is an  irregular wedge-shaped wound with a layer of dry eschar and slough covering it. I used a curette to debride eschar, slough, and subcutaneous tissue from the wound. He does need compression, but I would like his rheumatologist to see the wound at his appointment tomorrow so we will hold off on wrapping his leg. Given the atypical nature of his wound and the setting in which it occurred, I am going to try a mild topical steroid. Will apply triamcinolone here in clinic and I have  asked him to apply 1% hydrocortisone cream to the site. We will cover it with a silicone border dressing. He will follow-up in 1 week, at which time hopefully will have some more information from rheumatology about the potential etiology of his rash. Electronic Signature(s) Signed: 10/22/2023 10:39:07 AM By: Duanne Guess MD FACS Previous Signature: 10/22/2023 9:33:01 AM Version By: Duanne Guess MD FACS Previous Signature: 10/22/2023 9:32:02 AM Version By: Duanne Guess MD FACS Entered By: Duanne Guess on 10/22/2023 07:39:07 -------------------------------------------------------------------------------- HxROS Details Patient Name: Date of Service: Shawn [name], Shawn [name] 10/22/2023 8:00 A M Medical Record Number: [id number] Patient Account Number: [id number] HUEL, CENTOLA ([id number]) [phone].pdf Page 7 of 9 Date of Birth/Sex: Treating RN: December 06, 1961 (62 y.o. Marlan Palau Primary Care Provider: Other Clinician: Effie Shy Referring Provider: Treating Provider/Extender: Mallie Mussel in Treatment: 0 Constitutional Symptoms (General Health) Complaints and Symptoms: Negative for: Fatigue; Fever; Chills; Marked Weight Change Respiratory Complaints and Symptoms: Negative for: Chronic or frequent coughs; Shortness of Breath Musculoskeletal Complaints and Symptoms: Positive for: Muscle Weakness Negative for: Muscle Pain Neurologic Complaints and Symptoms: Negative for: Numbness/parasthesias Medical History: Positive for: Neuropathy Psychiatric Complaints and Symptoms: Negative for: Claustrophobia Eyes Complaints and Symptoms: Review of System Notes: Blind right eye Medical History: Past Medical History Notes: Retinal detachment Ear/Nose/Mouth/Throat Complaints and Symptoms: Review of System Notes: 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 Medical History: Past Medical History  Notes: Tinnitus Hematologic/Lymphatic Medical History: Positive for: Anemia Cardiovascular Medical History: Positive for: Angina; Arrhythmia - Atrial tachycardia, PVCs; Hypertension Past Medical History Notes: Aortic atherosclerosis, Claudication of both lower extremities Gastrointestinal Medical History: Past Medical History Notes: Anal fissure, GERD Endocrine Medical History: Negative for: Type II Diabetes Past Medical History Notes: Multiple thyroid nodules, Acute renal failure CHIBUEZE, BEASLEY ([id number]) [id number] Page 8 of 9 Genitourinary Medical History: Past Medical History Notes: ED, Hypogonadism, Kidney stone Immunological Integumentary (Skin) Medical History: Past Medical History Notes: Onychomycosis, Tinea cruris Oncologic Immunizations Pneumococcal Vaccine: Received Pneumococcal Vaccination: No Implantable Devices None Hospitalization / Surgery History Type of Hospitalization/Surgery Colonoscopy with esophagogastroduodenoscopy Cystoscopy/retrograde/ureteroscopy/stone extraction with basket (Left) Cystoscopy w/ ureteral stent placement Appendectomy Colonoscopy Family and Social History Cancer: Yes - Siblings; Diabetes: Yes - Mother,Maternal Grandparents; Heart Disease: Yes; Hereditary Spherocytosis: No; Hypertension: Yes; Kidney Disease: No; Lung Disease: No; Seizures: No; Stroke: Yes - Mother; Thyroid Problems: No; Tuberculosis: No; Never smoker; Marital Status - Married; Alcohol Use: Moderate; Drug Use: No History; Caffeine Use: Daily; Financial Concerns: No; Food, Clothing or Shelter Needs: No; Support System Lacking: No; Transportation Concerns: No Electronic Signature(s) Signed: 10/22/2023 2:17:41 PM By: Duanne Guess MD FACS Signed: 10/22/2023 3:21:42 PM By: Samuella Bruin Entered By: Samuella Bruin on 10/22/2023  05:31:42 -------------------------------------------------------------------------------- SuperBill Details Patient Name: Date of Service: Shawn [name], [place] [name] 10/22/2023 Medical Record Number: [id number] Patient Account Number: [id number] Date of Birth/Sex: Treating RN: March 21, 1961 (62 y.o. Valma Cava Primary Care Provider: Effie Shy Other Clinician: Referring Provider: Treating Provider/Extender: Lady Json  Iona Hansen, Greig Castilla Weeks in Treatment: 0 Diagnosis Coding ICD-10 Codes Code Description [phone] Non-pressure chronic ulcer of other part of right lower leg with fat layer exposed I87.2 Venous insufficiency (chronic) (peripheral) Facility Procedures : CPT4 Code: [id number] Description: 84696 - WOUND CARE VISIT-LEV 4 EST PT Modifier: 25 Quantity: 1 : Nilda Riggs Code: [id number] , Jettie ([id number] Description: 11042 - DEB SUBQ TISSUE 20 SQ CM/< ICD-10 Diagnosis Description ) [id number] Q59.563 Non-pressure chronic ulcer of other part of right lower leg with fat layer expo Modifier: [id number] sed Quantity: 1 1227.pdf Page 9 of 9 Physician Procedures : CPT4 Code Description Modifier [id number] [phone] - WC PHYS LEVEL 4 - NEW PT 25 ICD-10 Diagnosis Description L97.812 Non-pressure chronic ulcer of other part of right lower leg with fat layer exposed I87.2 Venous insufficiency (chronic) (peripheral) Quantity: 1 : [id number] 11042 - WC PHYS SUBQ TISS 20 SQ CM ICD-10 Diagnosis Description L97.812 Non-pressure chronic ulcer of other part of right lower leg with fat layer exposed Quantity: 1 Electronic Signature(s) Signed: 10/22/2023 9:33:29 AM By: Duanne Guess MD FACS Entered By: Duanne Guess on 10/22/2023 06:33:29

## 2023-10-22 NOTE — Progress Notes (Signed)
KIET, GEER ([id number]) [id number] Page 1 of 4 Visit Report for 10/22/2023 Abuse Risk Screen Details Patient Name: Date of Service: Shawn [name] 10/22/2023 8:00 A M Medical Record Number: [id number] Patient Account Number: [id number] Date of Birth/Sex: Treating RN: 1961-01-22 (62 y.o. Marlan Palau Primary Care Sawsan Riggio: Effie Shy Other Clinician: Referring Markon Jares: Treating Cletis Muma/Extender: Mallie Mussel in Treatment: 0 Abuse Risk Screen Items Answer ABUSE RISK SCREEN: Has anyone close to you tried to hurt or harm you recentlyo No Do you feel uncomfortable with anyone in your familyo No Has anyone forced you do things that you didnt want to doo No Electronic Signature(s) Signed: 10/22/2023 3:21:42 PM By: Samuella Bruin Entered By: Samuella Bruin on 10/22/2023 05:26:18 -------------------------------------------------------------------------------- Activities of Daily Living Details Patient Name: Date of Service: Shawn [name] 10/22/2023 8:00 A M Medical Record Number: [id number] Patient Account Number: [id number] Date of Birth/Sex: Treating RN: 1961/03/22 (62 y.o. Marlan Palau Primary Care Janica Eldred: Effie Shy Other Clinician: Referring Aailyah Dunbar: Treating Laressa Bolinger/Extender: Mallie Mussel in Treatment: 0 Activities of Daily Living Items Answer Activities of Daily Living (Please select one for each item) Drive Automobile Completely Able T Medications ake Completely Able Use T elephone Completely Able Care for Appearance Completely Able Use T oilet Completely Able Bath / Shower Completely Able Dress Self Completely Able Feed Self Completely Able Walk Completely Able Get In / Out Bed Completely Able Housework Completely Able Prepare Meals Completely Able Handle Money Completely Able Shop for Self Completely Able Electronic Signature(s) Signed:  10/22/2023 3:21:42 PM By: Samuella Bruin Entered By: Samuella Bruin on 10/22/2023 05:26:33 [name] ([id number]) [id number] Page 2 of 4 -------------------------------------------------------------------------------- Education Screening Details Patient Name: Date of Service: Shawn [name] 10/22/2023 8:00 A M Medical Record Number: [id number] Patient Account Number: [id number] Date of Birth/Sex: Treating RN: Nov 11, 1961 (62 y.o. Marlan Palau Primary Care Traveion Ruddock: Effie Shy Other Clinician: Referring Lucylle Foulkes: Treating Lukas Pelcher/Extender: Mallie Mussel in Treatment: 0 Primary Learner Assessed: Patient Learning Preferences/Education Level/Primary Language Learning Preference: Explanation, Demonstration, Video, Printed Material Highest Education Level: College or Above Preferred Language: English Cognitive Barrier Language Barrier: No Translator Needed: No Memory Deficit: No Emotional Barrier: No Cultural/Religious Beliefs Affecting Medical Care: No Physical Barrier Impaired Vision: Yes Impaired Hearing: Yes Decreased Hand dexterity: No Knowledge/Comprehension Knowledge Level: Medium Comprehension Level: Medium Ability to understand written instructions: Medium Ability to understand verbal instructions: Medium Motivation Anxiety Level: Calm Cooperation: Cooperative Education Importance: Acknowledges Need Interest in Health Problems: Asks Questions Perception: Coherent Willingness to Engage in Self-Management Medium Activities: Readiness to Engage in Self-Management Medium Activities: Electronic Signature(s) Signed: 10/22/2023 3:21:42 PM By: Samuella Bruin Entered By: Samuella Bruin on 10/22/2023 05:30:50 -------------------------------------------------------------------------------- Fall Risk Assessment Details Patient Name: Date of Service: Shawn [name], Shawn [name] 10/22/2023 8:00 A  M Medical Record Number: [id number] Patient Account Number: [id number] Date of Birth/Sex: Treating RN: 20-Nov-1961 (62 y.o. Marlan Palau Primary Care Sandrine Bloodsworth: Effie Shy Other Clinician: Referring Timmya Blazier: Treating Elany Felix/Extender: Mallie Mussel in Treatment: 0 Fall Risk Assessment Items Have you had 2 or more falls in the last [address] monthso 0 No [place], [place] ([id number]) [phone] [id number] Page 3 of 4 Have you had any fall that resulted in injury in the last 12 monthso 0 No FALLS RISK SCREEN History of falling - immediate or within 3 months 0 No Secondary diagnosis (Do you have 2 or more medical diagnoseso) 15 Yes Ambulatory aid  None/bed rest/wheelchair/nurse 0 Yes Crutches/cane/walker 0 No Furniture 0 No Intravenous therapy Access/Saline/Heparin Lock 0 No Gait/Transferring Normal/ bed rest/ wheelchair 0 Yes Weak (short steps with or without shuffle, stooped but able to lift head while walking, may seek 0 No support from furniture) Impaired (short steps with shuffle, may have difficulty arising from chair, head down, impaired 0 No balance) Mental Status Oriented to own ability 0 Yes Electronic Signature(s) Signed: 10/22/2023 3:21:42 PM By: Samuella Bruin Entered By: Samuella Bruin on 10/22/2023 05:31:03 -------------------------------------------------------------------------------- Foot Assessment Details Patient Name: Date of Service: Shawn [name], Shawn [name] 10/22/2023 8:00 A M Medical Record Number: [id number] Patient Account Number: [id number] Date of Birth/Sex: Treating RN: 1961-05-09 (62 y.o. Marlan Palau Primary Care Monti Jilek: Effie Shy Other Clinician: Referring Monica Zahler: Treating Yuri Flener/Extender: Mallie Mussel in Treatment: 0 Foot Assessment Items Site Locations + = Sensation present, - = Sensation absent, C = Callus, U = Ulcer R = Redness, W = Warmth, M =  Maceration, PU = Pre-ulcerative lesion F = Fissure, S = Swelling, D = Dryness Assessment Right: Left: Other Deformity: No No Prior Foot Ulcer: No No Prior Amputation: No No Charcot Joint: No No Ambulatory Status: Ambulatory Without Help GaitRITO, LECOMTE ([id number]) [phone] [id number] Page 4 of 4 Electronic Signature(s) Signed: 10/22/2023 3:21:42 PM By: Samuella Bruin Entered By: Samuella Bruin on 10/22/2023 05:33:02 -------------------------------------------------------------------------------- Nutrition Risk Screening Details Patient Name: Date of Service: Shawn [name] 10/22/2023 8:00 A M Medical Record Number: [id number] Patient Account Number: [id number] Date of Birth/Sex: Treating RN: Oct 27, 1961 (62 y.o. Marlan Palau Primary Care Adra Shepler: Effie Shy Other Clinician: Referring Venicia Vandall: Treating Shauntae Reitman/Extender: Mallie Mussel in Treatment: 0 Height (in): 70 Weight (lbs): 204 Body Mass Index (BMI): 29.3 Nutrition Risk Screening Items Score Screening NUTRITION RISK SCREEN: I have an illness or condition that made me change the kind and/or amount of food I eat 0 No I eat fewer than two meals per day 0 No I eat few fruits and vegetables, or milk products 0 No I have three or more drinks of beer, liquor or wine almost every day 0 No I have tooth or mouth problems that make it hard for me to eat 0 No I don't always have enough money to buy the food I need 0 No I eat alone most of the time 0 No I take three or more different prescribed or over-the-counter drugs a day 1 Yes Without wanting to, I have lost or gained 10 pounds in the last six months 0 No I am not always physically able to shop, cook and/or feed myself 0 No Nutrition Protocols Good Risk Protocol 0 No interventions needed Moderate Risk Protocol High Risk Proctocol Risk Level: Good Risk Score: 1 Electronic Signature(s) Signed:  10/22/2023 3:21:42 PM By: Samuella Bruin Entered By: Samuella Bruin on 10/22/2023 05:31:27

## 2023-10-23 DIAGNOSIS — Z79899 Other long term (current) drug therapy: Secondary | ICD-10-CM | POA: Diagnosis not present

## 2023-10-23 DIAGNOSIS — M48062 Spinal stenosis, lumbar region with neurogenic claudication: Secondary | ICD-10-CM | POA: Diagnosis not present

## 2023-10-23 DIAGNOSIS — M4316 Spondylolisthesis, lumbar region: Secondary | ICD-10-CM | POA: Diagnosis not present

## 2023-10-23 DIAGNOSIS — M310 Hypersensitivity angiitis: Secondary | ICD-10-CM | POA: Diagnosis not present

## 2023-10-26 DIAGNOSIS — L97812 Non-pressure chronic ulcer of other part of right lower leg with fat layer exposed: Secondary | ICD-10-CM | POA: Diagnosis not present

## 2023-10-27 DIAGNOSIS — I4510 Unspecified right bundle-branch block: Secondary | ICD-10-CM | POA: Diagnosis not present

## 2023-10-28 ENCOUNTER — Encounter (HOSPITAL_BASED_OUTPATIENT_CLINIC_OR_DEPARTMENT_OTHER): Payer: BC Managed Care – PPO | Admitting: General Surgery

## 2023-10-28 DIAGNOSIS — I872 Venous insufficiency (chronic) (peripheral): Secondary | ICD-10-CM | POA: Diagnosis not present

## 2023-10-28 DIAGNOSIS — I10 Essential (primary) hypertension: Secondary | ICD-10-CM | POA: Diagnosis not present

## 2023-10-28 DIAGNOSIS — L97812 Non-pressure chronic ulcer of other part of right lower leg with fat layer exposed: Secondary | ICD-10-CM | POA: Diagnosis not present

## 2023-10-28 NOTE — Progress Notes (Addendum)
KEYVIN, [name] ([id number]) [id number] Page 1 of 8 Visit Report for 10/28/2023 Chief Complaint Document Details Patient Name: Date of Service: Shawn [name] 10/28/2023 9:30 A M Medical Record Number: [id number] Patient Account Number: [id number] Date of Birth/Sex: Treating RN: [birth year] (62 y.o. M) Primary Care Provider: Effie Shy Other Clinician: Referring Provider: Treating Provider/Extender: Mallie Mussel in Treatment: 0 Information Obtained from: Patient Chief Complaint Patient presents to the wound care center with open non-healing surgical wound(s) in the setting of venous insufficiency Electronic Signature(s) Signed: 10/28/2023 10:06:56 AM By: Duanne Guess MD FACS Entered By: Duanne Guess on 10/28/2023 07:06:56 -------------------------------------------------------------------------------- Debridement Details Patient Name: Date of Service: Shawn [name], [name] 10/28/2023 9:30 A M Medical Record Number: [id number] Patient Account Number: [id number] Date of Birth/Sex: Treating RN: 11-29-1961 (62 y.o. Valma Cava Primary Care Provider: Effie Shy Other Clinician: Referring Provider: Treating Provider/Extender: Mallie Mussel in Treatment: 0 Debridement Performed for Assessment: Wound #1 Right,Medial Lower Leg Performed By: Physician Duanne Guess, MD The following information was scribed by: Tommie Ard The information was scribed for: Duanne Guess Debridement Type: Debridement Severity of Tissue Pre Debridement: Fat layer exposed Level of Consciousness (Pre-procedure): Awake and Alert Pre-procedure Verification/Time Out Yes - 09:45 Taken: Start Time: 09:46 Pain Control: Lidocaine 5% topical ointment Percent of Wound Bed Debrided: 100% T Area Debrided (cm): otal 1.88 Tissue and other material debrided: Viable, Non-Viable, Eschar, Slough, Subcutaneous, Slough Level:  Skin/Subcutaneous Tissue Debridement Description: Excisional Instrument: Curette Bleeding: Minimum Hemostasis Achieved: Pressure Response to Treatment: Procedure was tolerated well Level of Consciousness (Post- Awake and Alert procedure): Post Debridement Measurements of Total Wound Length: (cm) 1.5 Width: (cm) 1.6 Depth: (cm) 0.1 Volume: (cm) 0.188 Character of Wound/Ulcer Post Debridement: Requires Further Debridement Severity of Tissue Post Debridement: Fat layer exposed [name] ([id number]) [id number] Page 2 of 8 Post Procedure Diagnosis Same as Pre-procedure Electronic Signature(s) Signed: 10/28/2023 11:09:20 AM By: Tommie Ard RN Signed: 10/28/2023 11:26:11 AM By: Duanne Guess MD FACS Previous Signature: 10/28/2023 10:13:07 AM Version By: Duanne Guess MD FACS Entered By: Tommie Ard on 10/28/2023 08:09:20 -------------------------------------------------------------------------------- HPI Details Patient Name: Date of Service: Shawn [name], [name] 10/28/2023 9:30 A M Medical Record Number: [id number] Patient Account Number: [id number] Date of Birth/Sex: Treating RN: [birth year] (62 y.o. M) Primary Care Provider: Effie Shy Other Clinician: Referring Provider: Treating Provider/Extender: Mallie Mussel in Treatment: 0 History of Present Illness HPI Description: ADMISSION 10/22/2023 ***ABIs R: 1.19*** This is a 62 year old nondiabetic presenting to clinic with an ulcer on his right lower leg. He had a skin biopsy performed in this location to try to determine the etiology of a rash and the wound failed to heal. He does have venous reflux based upon study performed in July of this year. He was seen by vascular surgery after the study was performed. Reading the vascular surgery note, the rash appeared typical of vasculitis to the provider. He did not think treating the chronic venous insufficiency would make  his rash go away as compression and elevation had actually exacerbated the rash. The patient was encouraged to get a second opinion from a rheumatologist at an academic center and nothing further was done. He has his appointment with an [facility] provider tomorrow. He saw his PCP on September 29, 2023 and was given a 10-day course of Bactrim. He was also referred to the wound care center at that time. 10/28/2023: His wound is actually measuring a little bit  larger today. There is a leathery surface and fibrotic texture underneath. 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 Additional serologic testing is pending. He has been started on methotrexate and folic acid. He will be undergoing back surgery next week and has been told that he cannot travel in a car for at least 4 weeks after his procedure. Electronic Signature(s) Signed: 10/28/2023 10:08:37 AM By: Duanne Guess MD FACS Entered By: Duanne Guess on 10/28/2023 07:08:37 -------------------------------------------------------------------------------- Physical Exam Details Patient Name: Date of Service: Shawn [name] 10/28/2023 9:30 A M Medical Record Number: [id number] Patient Account Number: [id number] Date of Birth/Sex: Treating RN: Mar 20, 1961 (62 y.o. M) Primary Care Provider: Effie Shy Other Clinician: Referring Provider: Treating Provider/Extender: Mallie Mussel in Treatment: 0 Constitutional Slightly hypertensive. . . . no acute distress. Respiratory Normal work of breathing on room air.. Notes 10/28/2023: His wound is actually measuring a little bit larger today. There is a leathery surface and fibrotic texture underneath. Shawn [name] ([id number]) [id number] Page 3 of 8 Electronic Signature(s) Signed: 10/28/2023 10:10:13 AM By: Duanne Guess MD FACS Entered  By: Duanne Guess on 10/28/2023 07:10:13 -------------------------------------------------------------------------------- Physician Orders Details Patient Name: Date of Service: Shawn [name], [name] 10/28/2023 9:30 A M Medical Record Number: [id number] Patient Account Number: [id number] Date of Birth/Sex: Treating RN: 09-15-1961 (63 y.o. 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 - Dr. Lady Layson Return A Anesthetic Wound #1 Right,Medial Lower Leg (In clinic) Topical Lidocaine 5% applied to wound bed - prior to debridement Bathing/ Shower/ Hygiene May shower with protection but do not get wound dressing(s) wet. Protect dressing(s) with water repellant cover (for example, large plastic bag) or a cast cover and may then take shower. Edema Control - Orders / Instructions Bilateral Lower Extremities Segmental Compressive Device. Use the Segmental Compressive Device on leg(s) 2-3 times a day for 45 - 60 minutes. If wearing any wraps or hose, do not remove them. Continue exercising as instructed. Elevate legs to the level of the heart or above for 30 minutes daily and/or when sitting for 3-4 times a day throughout the day. Avoid standing for long periods of time. Wound Treatment Wound #1 - Lower Leg Wound Laterality: Right, Medial Cleanser: Soap and Water 1 x Per Day/30 Days Discharge Instructions: May shower and wash wound with dial  antibacterial soap and water prior to dressing change. Cleanser: Vashe 5.8 (oz) 1 x Per Day/30 Days Discharge Instructions: Cleanse the wound with Vashe prior to applying a clean dressing using gauze sponges, not tissue or cotton balls. Peri-Wound Care: Triamcinolone 15 (g) 1 x Per Day/30 Days Discharge Instructions: Use triamcinolone 15 (g)directly on to wound bed Topical: Triamcinolone 1 x Per Day/30 Days Discharge Instructions: Apply Triamcinolone as directed Topical: Skintegrity Hydrogel 4 (oz) 1 x Per Day/30 Days Discharge Instructions: Apply hydrogel as directed Prim Dressing: Promogran Prisma Matrix, 4.34 (sq in) (silver collagen) (DME) (Dispense As Written) 1 x Per Day/30 Days ary Discharge Instructions: Moisten collagen with saline or hydrogel Secondary Dressing:  Zetuvit Plus Silicone Border Dressing 3x3 (in/in) (Generic) 1 x Per Day/30 Days Discharge Instructions: Apply silicone border over primary dressing as directed. JANZEN, KLEBE ([id number]) [id number] Page 4 of 8 Electronic Signature(s) Signed: 10/28/2023 2:35:04 PM By: Tommie Ard RN Signed: 10/28/2023 4:45:05 PM By: Duanne Guess MD FACS Previous Signature: 10/28/2023 10:33:17 AM Version By: Duanne Guess MD FACS Entered By: Tommie Ard on 10/28/2023 09:52:52 -------------------------------------------------------------------------------- Problem List Details Patient Name: Date of Service: Shawn [name], [place] [name] 10/28/2023 9:30 A M Medical Record Number: [id number] Patient Account Number: [id number] Date of Birth/Sex: Treating RN: 10-Oct-1961 (62 y.o. 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 M) Primary Care Provider: Effie Shy Other Clinician: Referring Provider: Treating Provider/Extender: Mallie Mussel in Treatment: 0 Subjective Chief Complaint Information obtained from Patient Patient presents to the wound care center with open non-healing surgical wound(s) in the setting of venous insufficiency History of Present Illness (HPI) PERL, KLAUSER ([id number]) [id number] Page 5 of 8 ADMISSION 10/22/2023 ***ABIs R: 1.19*** This is a 62 year old nondiabetic presenting to clinic with an ulcer on his right lower leg. He had a skin biopsy performed in this location to try to determine the etiology of a rash and the wound failed to heal. He does have venous reflux based upon study performed in July of this year. He was seen by vascular surgery after the study was performed. Reading the vascular surgery note, the rash appeared typical of vasculitis to the provider. He did not think treating the chronic venous insufficiency would make his rash go away as compression and elevation had actually exacerbated the rash. The patient was encouraged to get a second opinion from a rheumatologist at an academic center and nothing further was done. He has his appointment with an [facility] provider tomorrow. He saw his PCP  on September 29, 2023 and was given a 10-day course of Bactrim. He was also referred to the wound care center at that time. 10/28/2023: His wound is actually measuring a little bit larger today. There is a leathery surface and fibrotic texture underneath. 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 Additional serologic testing is pending. He has been started on methotrexate and folic acid. He will be undergoing back surgery next week and has been told that he cannot travel in a car for at least 4 weeks after his procedure. Patient History Family History Cancer - Siblings, Diabetes - Mother,Maternal Grandparents, Heart Disease, Hypertension, Stroke - Mother, No family history of Hereditary Spherocytosis, Kidney Disease, Lung Disease, Seizures, Thyroid Problems, Tuberculosis. Social History Never smoker, Marital Status - Married, Alcohol Use - Moderate, Drug Use - No History, Caffeine Use - Daily. Medical History Hematologic/Lymphatic Patient has history of Anemia Cardiovascular Patient has history of Angina, Arrhythmia - Atrial tachycardia, PVCs,  Hypertension Endocrine Denies history of Type II Diabetes Neurologic Patient has history of Neuropathy Hospitalization/Surgery History - Colonoscopy with esophagogastroduodenoscopy. - Cystoscopy/retrograde/ureteroscopy/stone extraction with basket (Left). - Cystoscopy w/ ureteral stent placement. - Appendectomy. - Colonoscopy. Medical A Surgical History Notes nd Eyes Retinal detachment Ear/Nose/Mouth/Throat Tinnitus Cardiovascular Aortic atherosclerosis, Claudication of both lower extremities Gastrointestinal Anal fissure, GERD Endocrine Multiple thyroid nodules, Acute renal failure Genitourinary ED, Hypogonadism, Kidney stone Integumentary (Skin) Onychomycosis, Tinea cruris Objective Constitutional Slightly hypertensive. no acute  distress. Vitals Time Taken: 9:25 AM, Height: 70 in, Weight: 204 lbs, BMI: 29.3, Temperature: 98.0 F, Pulse: 75 bpm, Respiratory Rate: 18 breaths/min, Blood Pressure: 146/89 mmHg. Respiratory Normal work of breathing on room air.. General Notes: 10/28/2023: His wound is actually measuring a little bit larger today. There is a leathery surface and fibrotic texture underneath. Integumentary (Hair, Skin) Wound #1 status is Open. Original cause of wound was Puncture. The date acquired was: 07/29/2023. The wound is located on the Right,Medial Lower Leg. The wound measures 1.5cm length x 1.6cm width x 0.1cm depth; 1.885cm^2 area and 0.188cm^3 volume. There is Fat Layer (Subcutaneous Tissue) exposed. There is a medium amount of serosanguineous drainage noted. The wound margin is distinct with the outline attached to the wound base. There is small (1-33%) [name] granulation within the wound bed. There is a large (67-100%) amount of necrotic tissue within the wound bed including Eschar. The periwound skin appearance had no abnormalities noted for texture. The periwound skin appearance had no abnormalities noted for moisture. The periwound skin appearance had no abnormalities noted for color. Periwound temperature was noted as No Abnormality. 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 There was a Excisional Skin/Subcutaneous Tissue Debridement with a total area of 1.88 sq cm performed by Duanne Guess, MD. With the following instrument(s): Curette to remove Viable and Non-Viable  tissue/material. Material removed includes Eschar, Subcutaneous Tissue, and Slough after achieving pain control using Lidocaine 5% topical ointment. No specimens were taken. A time out was conducted at 09:45, prior to the start of the procedure. A Minimum amount of bleeding was controlled with Pressure. The procedure was tolerated well. Post Debridement Measurements: 1.5cm length x 1.6cm width x 0.1cm depth; 0.188cm^3 volume. Character of Wound/Ulcer Post Debridement requires further debridement. Severity of Tissue Post Debridement is: Fat layer exposed. Post procedure Diagnosis Wound #1: Same as Pre-Procedure Plan Follow-up Appointments: Return Appointment in 2 weeks. - Dr. Lady Kdyn Anesthetic: Wound #1 Right,Medial Lower Leg: (In clinic) Topical Lidocaine 5% applied to wound bed - prior to debridement Bathing/ Shower/ Hygiene: May shower with protection but do not get wound dressing(s) wet. Protect dressing(s) with water repellant cover (for example, large plastic bag) or a cast cover and may then take shower. Edema Control - Orders / Instructions: Segmental Compressive Device. Use the Segmental Compressive Device on leg(s) 2-3 times a day for 45 - 60 minutes. If wearing any wraps or hose, do not remove them. Continue exercising as instructed. Elevate legs to the level of the heart or above for 30 minutes daily and/or when sitting for 3-4 times a day throughout the day. Avoid standing for long periods of time. WOUND #1: - Lower Leg Wound Laterality:  Right, Medial Cleanser: Soap and Water 1 x Per Day/30 Days Discharge Instructions: May shower and wash wound with dial antibacterial soap and water prior to dressing change. Cleanser: Vashe 5.8 (oz) 1 x Per Day/30 Days Discharge Instructions: Cleanse the wound with Vashe prior to applying a clean dressing using gauze sponges, not tissue or cotton balls. Peri-Wound Care: Triamcinolone 15 (g) 1 x Per Day/30 Days Discharge Instructions: Use  triamcinolone 15 (g)directly on to wound bed Topical: Triamcinolone 1 x Per Day/30 Days Discharge Instructions: Apply Triamcinolone as directed Topical: Skintegrity Hydrogel 4 (oz) 1 x Per Day/30 Days Discharge Instructions: Apply hydrogel as directed Prim Dressing: Promogran Prisma Matrix, 4.34 (sq in) (silver collagen) (DME) (Dispense As Written) 1 x Per Day/30 Days ary Discharge Instructions: Moisten collagen with saline or hydrogel Secondary Dressing: Zetuvit Plus Silicone Border Dressing 3x3 (in/in) (Generic) 1 x Per Day/30 Days Discharge Instructions: Apply silicone border over primary dressing as directed. 10/28/2023: His wound is actually measuring a little bit larger today. There is a leathery surface and fibrotic texture underneath. 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 Additional serologic testing is pending. He has been started on methotrexate and folic acid. He will be undergoing back surgery next week and has been told that he cannot travel in a car for at least 4 weeks after his procedure. I used a curette to debride slough and subcutaneous tissue from his wound. We will continue the topical corticosteroid but I am also going to add some Prisma silver collagen moistened with hydrogel to try and retain a little bit more moisture at the site. Due to his surgery, he likely will not be able to return to clinic for about a month but if he is able to return in 1 to 2 weeks, it would be my preference to see him at that time. Electronic Signature(s) Signed: 10/30/2023 1:18:47 PM By: Shawn Stall RN, BSN Signed: 11/02/2023 8:27:16 AM By: Duanne Guess MD FACS Previous Signature: 10/28/2023 10:13:42 AM Version By: Duanne Guess MD FACS Previous Signature: 10/28/2023 10:12:45 AM Version By: Duanne Guess MD FACS Entered By: Shawn Stall on 10/30/2023 10:09:13 [name]  ([id number]) [id number] Page 7 of 8 -------------------------------------------------------------------------------- HxROS Details Patient Name: Date of Service: Shawn [name] 10/28/2023 9:30 A M Medical Record Number: [id number] Patient Account Number: [id number] Date of Birth/Sex: Treating RN: October 27, 1961 (62 y.o. M) Primary Care Provider: Effie Shy Other Clinician: Referring Provider: Treating Provider/Extender: Mallie Mussel in Treatment: 0 Eyes Medical History: Past Medical History Notes: Retinal detachment Ear/Nose/Mouth/Throat Medical History: Past Medical History Notes: Tinnitus Hematologic/Lymphatic Medical History: Positive for: Anemia Cardiovascular Medical History: Positive for: Angina; Arrhythmia - Atrial tachycardia, PVCs; Hypertension Past Medical History Notes: Aortic atherosclerosis, Claudication of both lower extremities Gastrointestinal Medical History: Past Medical History Notes: Anal fissure, GERD Endocrine Medical History: Negative for: Type II Diabetes Past Medical History Notes: Multiple thyroid nodules, Acute renal failure Genitourinary Medical History: Past Medical History Notes: ED, Hypogonadism, Kidney stone Integumentary (Skin) Medical History: Past Medical History Notes: Onychomycosis, Tinea cruris Neurologic Medical History: Positive for: Neuropathy Immunizations Pneumococcal Vaccine: Received Pneumococcal Vaccination: No Implantable Devices None Butler, [name] ([id number]) [id number] Page 8 of 8 Hospitalization / Surgery History Type of Hospitalization/Surgery Colonoscopy with esophagogastroduodenoscopy Cystoscopy/retrograde/ureteroscopy/stone extraction with basket (Left) Cystoscopy w/ ureteral stent placement Appendectomy Colonoscopy Family and Social History Cancer: Yes - Siblings; Diabetes: Yes - Mother,Maternal Grandparents; Heart  Disease: Yes; Hereditary Spherocytosis: No; Hypertension:  Yes; Kidney Disease: No; Lung Disease: No; Seizures: No; Stroke: Yes - Mother; Thyroid Problems: No; Tuberculosis: No; Never smoker; Marital Status - Married; Alcohol Use: Moderate; Drug Use: No History; Caffeine Use: Daily; Financial Concerns: No; Food, Clothing or Shelter Needs: No; Support System Lacking: No; Transportation Concerns: No Electronic Signature(s) Signed: 10/28/2023 10:33:17 AM By: Duanne Guess MD FACS Entered By: Duanne Guess on 10/28/2023 07:09:43 -------------------------------------------------------------------------------- SuperBill Details Patient Name: Date of Service: Shawn [name], [name] 10/28/2023 Medical Record Number: [id number] Patient Account Number: [id number] Date of Birth/Sex: Treating RN: 09-Jan-1961 (62 y.o. 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 Facility Procedures : CPT4 Code: [id number] Description: 11042 - DEB SUBQ TISSUE 20 SQ CM/< ICD-10 Diagnosis Description L97.812 Non-pressure chronic ulcer of other part of right lower leg with fat layer exp Modifier: osed Quantity: 1 Physician Procedures : CPT4 Code Description Modifier [id number] - WC PHYS LEVEL 4 - EST PT ICD-10 Diagnosis Description L97.812 Non-pressure chronic ulcer of other part of right lower leg with fat layer exposed I87.2 Venous insufficiency (chronic) (peripheral) M31.30  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

## 2023-10-28 NOTE — Progress Notes (Signed)
VOSHON, KERWOOD ([id number]) [id number] Page 1 of 6 Visit Report for 10/28/2023 Arrival Information Details Patient Name: Date of Service: Shawn [name] 10/28/2023 9:30 A M Medical Record Number: [id number] Patient Account Number: [id number] Date of Birth/Sex: Treating RN: 1961-05-25 (62 y.o. M) Primary Care Skye Plamondon: Effie Shy Other Clinician: Referring Doloris Servantes: Treating Avrey Hyser/Extender: Mallie Mussel in Treatment: 0 Visit Information History Since Last Visit Added or deleted any medications: No Patient Arrived: Ambulatory Any new allergies or adverse reactions: No Arrival Time: 09:25 Had a fall or experienced change in No Accompanied By: self activities of daily living that may affect Transfer Assistance: None risk of falls: Patient Identification Verified: Yes Signs or symptoms of abuse/neglect since last visito No Secondary Verification Process Completed: Yes Hospitalized since last visit: No Implantable device outside of the clinic excluding No cellular tissue based products placed in the center since last visit: Pain Present Now: No Electronic Signature(s) Signed: 10/28/2023 9:58:42 AM By: Dayton Scrape Entered By: Dayton Scrape on 10/28/2023 06:25:55 -------------------------------------------------------------------------------- Lower Extremity Assessment Details Patient Name: Date of Service: Shawn [name] 10/28/2023 9:30 A M Medical Record Number: [id number] Patient Account Number: [id number] Date of Birth/Sex: Treating RN: 05/12/1961 (61 y.o. Valma Cava Primary Care Brexton Sofia: Effie Shy Other Clinician: Referring Metta Koranda: Treating Saniya Tranchina/Extender: Mallie Mussel in Treatment: 0 Edema Assessment Assessed: Kyra Searles: No] [Right: No] [Left: Edema] [Right: :] Calf Left: Right: Point of Measurement: From Medial Instep 40 cm Ankle Left: Right: Point of Measurement: From Medial  Instep 25 cm Vascular Assessment Pulses: Dorsalis Pedis Palpable: [Right:Yes] Extremity colors, hair growth, and conditions: Extremity Color: [Right:Normal] JAZ, HEIFNER ([id number]) [Right:[id number] Page 2 of 6] Hair Growth on Extremity: [Right:Yes] Temperature of Extremity: [Right:Warm] Capillary Refill: [Right:< 3 seconds] Dependent Rubor: [Right:No No] Electronic Signature(s) Signed: 10/28/2023 2:35:04 PM By: Tommie Ard RN Entered By: Tommie Ard on 10/28/2023 06:37:53 -------------------------------------------------------------------------------- Multi Wound Chart Details Patient Name: Date of Service: [name], [name] 10/28/2023 9:30 A M Medical Record Number: [id number] Patient Account Number: [id number] Date of Birth/Sex: Treating RN: [birth year] (62 y.o. M) Primary Care Ardeth Repetto: Effie Shy Other Clinician: Referring Tru Leopard: Treating Quantel Mcinturff/Extender: Mallie Mussel in Treatment: 0 Vital Signs Height(in): 70 Pulse(bpm): 75 Weight(lbs): 204 Blood Pressure(mmHg): 146/89 Body Mass Index(BMI): 29.3 Temperature(F): 98.0 Respiratory Rate(breaths/min): 18 [1:Photos:] [N/A:N/A] Right, Medial Lower Leg N/A N/A Wound Location: Puncture N/A N/A Wounding Event: Venous Leg Ulcer N/A N/A Primary Etiology: Anemia, Angina, Arrhythmia, N/A N/A Comorbid History: Hypertension, Neuropathy 07/29/2023 N/A N/A Date Acquired: 0 N/A N/A Weeks of Treatment: Open N/A N/A Wound Status: No N/A N/A Wound Recurrence: 1.5x1.6x0.1 N/A N/A Measurements L x W x D (cm) 1.885 N/A N/A A (cm) : rea 0.188 N/A N/A Volume (cm) : 2.80% N/A N/A % Reduction in A rea: 3.10% N/A N/A % Reduction in Volume: Full Thickness Without Exposed N/A N/A Classification: Support Structures Medium N/A N/A Exudate A mount: Serosanguineous N/A N/A Exudate Type: red, brown N/A N/A Exudate Color: Distinct, outline attached N/A N/A Wound  Margin: Small (1-33%) N/A N/A Granulation A mount: Red N/A N/A Granulation Quality: Large (67-100%) N/A N/A Necrotic A mount: Eschar N/A N/A Necrotic Tissue: Fat Layer (Subcutaneous Tissue): Yes N/A N/A Exposed Structures: Fascia: No Tendon: No Muscle: No Joint: No Bone: No None N/A N/A Epithelialization: Debridement - Selective/Open Wound N/A N/A Debridement: Pre-procedure Verification/Time Out 09:45 N/A N/A JASYN, MARRA ([id number]) [id number] Page 3 of 6 Taken: Lidocaine 5% topical ointment N/A N/A  Pain Control: Slough N/A N/A Tissue Debrided: Non-Viable Tissue N/A N/A Level: 1.88 N/A N/A Debridement A (sq cm): rea Curette N/A N/A Instrument: Minimum N/A N/A Bleeding: Pressure N/A N/A Hemostasis A chieved: Procedure was tolerated well N/A N/A Debridement Treatment Response: 1.5x1.6x0.1 N/A N/A Post Debridement Measurements L x W x D (cm) 0.188 N/A N/A Post Debridement Volume: (cm) No Abnormalities Noted N/A N/A Periwound Skin Texture: No Abnormalities Noted N/A N/A Periwound Skin Moisture: No Abnormalities Noted N/A N/A Periwound Skin Color: No Abnormality N/A N/A Temperature: Debridement N/A N/A Procedures Performed: Treatment Notes Electronic Signature(s) Signed: 10/28/2023 10:06:48 AM By: Duanne Guess MD FACS Entered By: Duanne Guess on 10/28/2023 07:06:48 -------------------------------------------------------------------------------- Multi-Disciplinary Care Plan Details Patient Name: Date of Service: [name], [name] 10/28/2023 9:30 A M Medical Record Number: [id number] Patient Account Number: [id number] Date of Birth/Sex: Treating RN: [birth year] (62 y.o. Valma Cava Primary Care Kareema Keitt: Effie Shy Other Clinician: Referring Tryphena Perkovich: Treating Karmyn Lowman/Extender: Mallie Mussel in Treatment: 0 Active Inactive Orientation to the Wound Care Program Nursing  Diagnoses: Knowledge deficit related to the wound healing center program Goals: Patient/caregiver will verbalize understanding of the Wound Healing Center Program Date Initiated: 10/28/2023 Target Resolution Date: 11/18/2023 Goal Status: Active Interventions: Provide education on orientation to the wound center Notes: Venous Leg Ulcer Nursing Diagnoses: Knowledge deficit related to disease process and management Goals: Patient will maintain optimal edema control Date Initiated: 10/28/2023 Target Resolution Date: 11/18/2023 Goal Status: Active Interventions: Assess peripheral edema status every visit. Provide education on venous insufficiency Notes: RAKIEM, FACTOR ([id number]) [id number] Page 4 of 6 Nursing Diagnoses: Impaired tissue integrity Knowledge deficit related to ulceration/compromised skin integrity Goals: Ulcer/skin breakdown will have a volume reduction of 30% by week 4 Date Initiated: 10/28/2023 Target Resolution Date: 11/18/2023 Goal Status: Active Interventions: Assess ulceration(s) every visit Provide education on ulcer and skin care Treatment Activities: Skin care regimen initiated : 10/28/2023 Notes: Electronic Signature(s) Signed: 10/28/2023 2:35:04 PM By: Tommie Ard RN Entered By: Tommie Ard on 10/28/2023 06:44:31 -------------------------------------------------------------------------------- Pain Assessment Details Patient Name: Date of Service: [name] 10/28/2023 9:30 A M Medical Record Number: [id number] Patient Account Number: [id number] Date of Birth/Sex: Treating RN: [birth year] (62 y.o. M) Primary Care Safa Derner: Effie Shy Other Clinician: Referring Forrest Jaroszewski: Treating Dierks Wach/Extender: Mallie Mussel in Treatment: 0 Active Problems Location of Pain Severity and Description of Pain Patient Has Paino No Site Locations Pain Management and  Medication Current Pain Management: Electronic Signature(s) Signed: 10/28/2023 9:58:42 AM By: Dayton Scrape Entered By: Dayton Scrape on 10/28/2023 47:42:59 [name] ([id number]) [id number] Page 5 of 6 -------------------------------------------------------------------------------- Patient/Caregiver Education Details Patient Name: Date of Service: [name], [place] 11/13/2024andnbsp9:30 A M Medical Record Number: [id number] Patient Account Number: [id number] Date of Birth/Gender: Treating RN: 04/25/1961 (62 y.o. Valma Cava Primary Care Physician: Effie Shy Other Clinician: Referring Physician: Treating Physician/Extender: Mallie Mussel in Treatment: 0 Education Assessment Education Provided To: Patient Education Topics Provided Wound/Skin Impairment: Methods: Explain/Verbal Responses: Reinforcements needed, State content correctly Electronic Signature(s) Signed: 10/28/2023 2:35:04 PM By: Tommie Ard RN Entered By: Tommie Ard on 10/28/2023 06:45:02 -------------------------------------------------------------------------------- Wound Assessment Details Patient Name: Date of Service: [name] 10/28/2023 9:30 A M Medical Record Number: [id number] Patient Account Number: [id number] Date of Birth/Sex: Treating RN: Mar 25, 1961 (62 y.o. Valma Cava Primary Care Mar Walmer: Effie Shy Other Clinician: Referring Rajohn Henery: Treating Suly Vukelich/Extender: Mallie Mussel in Treatment: 0 Wound  Status Wound Number: 1 Primary Etiology: Venous Leg Ulcer Wound Location: Right, Medial Lower Leg Wound Status: Open Wounding Event: Puncture Notes: biopsy for Rash Date Acquired: 07/29/2023 Comorbid History: Anemia, Angina, Arrhythmia, Hypertension, Neuropathy Weeks Of Treatment: 0 Clustered Wound: No Photos Wound Measurements Length: (cm) 1.5 Width: (cm) 1.6 [place], [name] ([id number]) Depth:  (cm) 0.1 Area: (cm) 1.885 Volume: (cm) 0.188 % Reduction in Area: 2.8% % Reduction in Volume: 3.1% [id number] Page 6 of 6 Epithelialization: None Wound Description Classification: Full Thickness Without Exposed Support Structures Wound Margin: Distinct, outline attached Exudate Amount: Medium Exudate Type: Serosanguineous Exudate Color: red, brown Foul Odor After Cleansing: No Slough/Fibrino Yes Wound Bed Granulation Amount: Small (1-33%) Exposed Structure Granulation Quality: Red Fascia Exposed: No Necrotic Amount: Large (67-100%) Fat Layer (Subcutaneous Tissue) Exposed: Yes Necrotic Quality: Eschar Tendon Exposed: No Muscle Exposed: No Joint Exposed: No Bone Exposed: No Periwound Skin Texture Texture Color No Abnormalities Noted: Yes No Abnormalities Noted: Yes Moisture Temperature / Pain No Abnormalities Noted: Yes Temperature: No Abnormality Electronic Signature(s) Signed: 10/28/2023 2:35:04 PM By: Tommie Ard RN Entered By: Tommie Ard on 10/28/2023 06:53:14 -------------------------------------------------------------------------------- Vitals Details Patient Name: Date of Service: [name], [name] 10/28/2023 9:30 A M Medical Record Number: [id number] Patient Account Number: [id number] Date of Birth/Sex: Treating RN: 09-14-1961 (62 y.o. M) Primary Care Quinnlyn Hearns: Effie Shy Other Clinician: Referring Maeci Kalbfleisch: Treating Diamone Whistler/Extender: Mallie Mussel in Treatment: 0 Vital Signs Time Taken: 09:25 Temperature (F): 98.0 Height (in): 70 Pulse (bpm): 75 Weight (lbs): 204 Respiratory Rate (breaths/min): 18 Body Mass Index (BMI): 29.3 Blood Pressure (mmHg): 146/89 Reference Range: 80 - 120 mg / dl Electronic Signature(s) Signed: 10/28/2023 9:58:42 AM By: Dayton Scrape Entered By: Dayton Scrape on 10/28/2023 64:33:29

## 2023-10-29 DIAGNOSIS — L97812 Non-pressure chronic ulcer of other part of right lower leg with fat layer exposed: Secondary | ICD-10-CM | POA: Diagnosis not present

## 2023-10-30 DIAGNOSIS — R051 Acute cough: Secondary | ICD-10-CM | POA: Diagnosis not present

## 2023-11-02 DIAGNOSIS — M4316 Spondylolisthesis, lumbar region: Secondary | ICD-10-CM | POA: Diagnosis not present

## 2023-11-02 DIAGNOSIS — M48062 Spinal stenosis, lumbar region with neurogenic claudication: Secondary | ICD-10-CM | POA: Diagnosis not present

## 2023-11-03 DIAGNOSIS — M48062 Spinal stenosis, lumbar region with neurogenic claudication: Secondary | ICD-10-CM | POA: Diagnosis not present

## 2023-11-03 DIAGNOSIS — I959 Hypotension, unspecified: Secondary | ICD-10-CM | POA: Diagnosis not present

## 2023-11-03 DIAGNOSIS — I951 Orthostatic hypotension: Secondary | ICD-10-CM | POA: Diagnosis not present

## 2023-11-04 ENCOUNTER — Encounter: Payer: Self-pay | Admitting: Internal Medicine

## 2023-11-04 DIAGNOSIS — Z419 Encounter for procedure for purposes other than remedying health state, unspecified: Secondary | ICD-10-CM | POA: Diagnosis not present

## 2023-11-05 DIAGNOSIS — Z4789 Encounter for other orthopedic aftercare: Secondary | ICD-10-CM | POA: Diagnosis not present

## 2023-11-05 DIAGNOSIS — I499 Cardiac arrhythmia, unspecified: Secondary | ICD-10-CM | POA: Diagnosis not present

## 2023-11-05 DIAGNOSIS — Z419 Encounter for procedure for purposes other than remedying health state, unspecified: Secondary | ICD-10-CM | POA: Diagnosis not present

## 2023-11-05 DIAGNOSIS — Z981 Arthrodesis status: Secondary | ICD-10-CM | POA: Diagnosis not present

## 2023-11-10 ENCOUNTER — Encounter (HOSPITAL_BASED_OUTPATIENT_CLINIC_OR_DEPARTMENT_OTHER): Payer: BC Managed Care – PPO | Admitting: General Surgery

## 2023-11-10 DIAGNOSIS — I10 Essential (primary) hypertension: Secondary | ICD-10-CM | POA: Diagnosis not present

## 2023-11-10 NOTE — Progress Notes (Signed)
KEYONN, KUBICK ([id number]) [id number] Page 1 of 15 Visit Report for 11/10/2023 Arrival Information Details Patient Name: Date of Service: Shawn [name] 11/10/2023 8:45 A M Medical Record Number: [id number] Patient Account Number: [id number] Date of Birth/Sex: Treating RN: 08-16-1961 (62 y.o. Shawn [name], Shawn [name] Primary Care Shawn [name]: Shawn [name] Other Clinician: Referring Shawn [name]: Treating Shawn [name]/Extender: Shawn [name] in Treatment: 2 Visit Information History Since Last Visit Added or deleted any medications: No Patient Arrived: Shawn [name] Any new allergies or adverse reactions: No Arrival Time: 08:47 Had a fall or experienced change in No Accompanied By: spouse activities of daily living that may affect Transfer Assistance: None risk of falls: Patient Identification Verified: Yes Signs or symptoms of abuse/neglect since last visito No Secondary Verification Process Completed: Yes Hospitalized since last visit: Yes Patient Requires Transmission-Based Precautions: No Implantable device outside of the clinic excluding No Patient Has Alerts: No cellular tissue based products placed in the center since last visit: Has Dressing in Place as Prescribed: Yes Pain Present Now: No Notes patient had back surgery last Monday- inpatient 4 days surgery and BP issues. Electronic Signature(s) Signed: 11/10/2023 1:35:07 PM By: Shawn Stall RN, BSN Entered By: Shawn [name] on 11/10/2023 08:48:26 -------------------------------------------------------------------------------- Encounter Discharge Information Details Patient Name: Date of Service: Shawn [name], Shawn [name] 11/10/2023 8:45 A M Medical Record Number: [id number] Patient Account Number: [id number] Date of Birth/Sex: Treating RN: 1961-01-11 (62 y.o. Shawn [name] Primary Care Fara Worthy: Shawn [name] Other Clinician: Referring Shawn [name]: Treating Shawn [name]/Extender: Shawn [name] in Treatment: 2 Encounter Discharge Information Items Post Procedure Vitals Discharge Condition: Stable Temperature (F): 97.8 Ambulatory Status: Walker Pulse (bpm): 77 Discharge Destination: Home Respiratory Rate (breaths/min): 20 Transportation: Private Auto Blood Pressure (mmHg): 121/72 Accompanied By: wife Schedule Follow-up Appointment: Yes Clinical Summary of Care: Electronic Signature(s) Signed: 11/10/2023 1:35:07 PM By: Shawn Stall RN, BSN Entered By: Shawn [name] on 11/10/2023 09:18:49 Shawn [name] ([id number]) [id number] Page 2 of 15 -------------------------------------------------------------------------------- Lower Extremity Assessment Details Patient Name: Date of Service: Shawn [name] 11/10/2023 8:45 A M Medical Record Number: [id number] Patient Account Number: [id number] Date of Birth/Sex: Treating RN: 04/18/1961 (62 y.o. Shawn [name] Primary Care Danayah [name]: Shawn [name] Other Clinician: Referring Shawn [name]: Treating Shawn [name]/Extender: Shawn [name] in Treatment: 2 Edema Assessment Assessed: Shawn [name]: No] [Right: Yes] Edema: [Left: N] [Right: o] Calf Left: Right: Point of Measurement: From Medial Instep 38 cm Ankle Left: Right: Point of Measurement: From Medial Instep 26 cm Vascular Assessment Pulses: Dorsalis Pedis Palpable: [Right:Yes] Extremity colors, hair growth, and conditions: Extremity Color: [Right:Normal] Hair Growth on Extremity: [Right:Yes] Temperature of Extremity: [Right:Warm] Capillary Refill: [Right:< 3 seconds] Dependent Rubor: [Right:No] Blanched when Elevated: [Right:No No] Toe Nail Assessment Left: Right: Thick: No Discolored: No Deformed: No Improper Length and Hygiene: No Electronic Signature(s) Signed: 11/10/2023 1:35:07 PM By: Shawn Stall RN, BSN Entered By: Shawn [name] on 11/10/2023  08:50:34 -------------------------------------------------------------------------------- Multi Wound Chart Details Patient Name: Date of Service: Shawn [name], Shawn [name] 11/10/2023 8:45 A M Medical Record Number: [id number] Patient Account Number: [id number] Date of Birth/Sex: Treating RN: 1961/06/10 (62 y.o. M) Primary Care Shawn [name]: Shawn [name] Other Clinician: Referring Zorianna Taliaferro: Treating Sharnese Heath/Extender: Shawn [name] in Treatment: 2 Vital Signs Height(in): 70 Pulse(bpm): 77 Weight(lbs): 204 Blood Pressure(mmHg): 121/72 Shawn [name] ([id number]) [phone].pdf Page 3 of 15 Body Mass Index(BMI): 29.3 Temperature(F): 97.8 Respiratory Rate(breaths/min): 20 [1:Photos:] Right, Medial Lower Leg Right, Proximal, Medial Lower Leg Right, Distal, Medial, Anterior Lower  Wound Location: Leg Puncture Gradually Appeared Gradually Appeared Wounding Event: Venous Leg Ulcer Venous Leg Ulcer Venous Leg Ulcer Primary Etiology: Anemia, Angina, Arrhythmia, Anemia, Angina, Arrhythmia, Anemia, Angina, Arrhythmia, Comorbid History: Hypertension, Neuropathy Hypertension, Neuropathy Hypertension, Neuropathy 07/29/2023 10/27/2023 11/10/2023 Date Acquired: 2 0 0 Weeks of Treatment: Open Open Open Wound Status: No No No Wound Recurrence: 2x2.5x0.1 1.4x1.2x0.1 0.2x0.2x0.1 Measurements L x W x D (cm) 3.927 1.319 0.031 A (cm) : rea 0.393 0.132 0.003 Volume (cm) : -102.40% N/A N/A % Reduction in A rea: -102.60% N/A N/A % Reduction in Volume: Full Thickness Without Exposed Full Thickness Without Exposed Full Thickness Without Exposed Classification: Support Structures Support Structures Support Structures Medium Medium Medium Exudate A mount: Serosanguineous Serosanguineous Serosanguineous Exudate Type: [name], brown [name], brown [name], brown Exudate Color: Distinct, outline attached Distinct, outline attached Distinct, outline attached Wound  Margin: Small (1-33%) Large (67-100%) Medium (34-66%) Granulation A mount: [name] [name], Pink [name], Pink Granulation Quality: Large (67-100%) Small (1-33%) Medium (34-66%) Necrotic A mount: Fat Layer (Subcutaneous Tissue): Yes Fat Layer (Subcutaneous Tissue): Yes Fat Layer (Subcutaneous Tissue): Yes Exposed Structures: Fascia: No Fascia: No Fascia: No Tendon: No Tendon: No Tendon: No Muscle: No Muscle: No Muscle: No Joint: No Joint: No Joint: No Bone: No Bone: No Bone: No None Small (1-33%) None Epithelialization: Debridement - Excisional Debridement - Excisional Debridement - Excisional Debridement: Pre-procedure Verification/Time Out 09:00 09:00 09:00 Taken: Lidocaine 4% Topical Solution Lidocaine 4% Topical Solution Lidocaine 4% Topical Solution Pain Control: Subcutaneous, Slough Subcutaneous, Slough Subcutaneous, Slough Tissue Debrided: Skin/Subcutaneous Tissue Skin/Subcutaneous Tissue Skin/Subcutaneous Tissue Level: 3.92 1.32 0.03 Debridement A (sq cm): rea Curette Curette Curette Instrument: Minimum Minimum Minimum Bleeding: Pressure Pressure Pressure Hemostasis A chieved: 0 0 0 Procedural Pain: 0 0 0 Post Procedural Pain: Procedure was tolerated well Procedure was tolerated well Procedure was tolerated well Debridement Treatment Response: 2x2.5x0.1 1.4x1.2x0.1 0.2x0.2x0.1 Post Debridement Measurements L x W x D (cm) 0.393 0.132 0.003 Post Debridement Volume: (cm) Excoriation: No Excoriation: No Excoriation: No Periwound Skin Texture: Induration: No Induration: No Induration: No Callus: No Callus: No Callus: No Crepitus: No Crepitus: No Crepitus: No Rash: No Rash: No Rash: No Scarring: No Scarring: No Scarring: No Maceration: No Maceration: No Maceration: No Periwound Skin Moisture: Dry/Scaly: No Dry/Scaly: No Dry/Scaly: No Atrophie Blanche: No Hemosiderin Staining: Yes Hemosiderin Staining: Yes Periwound Skin Color: Cyanosis:  No Atrophie Blanche: No Atrophie Blanche: No Ecchymosis: No Cyanosis: No Cyanosis: No Erythema: No Ecchymosis: No Ecchymosis: No Hemosiderin Staining: No Erythema: No Erythema: No Mottled: No Mottled: No Mottled: No Pallor: No Pallor: No Pallor: No Rubor: No Rubor: No Rubor: No No Abnormality No Abnormality No Abnormality Temperature: Debridement Debridement Debridement Procedures Performed: Wound Number: 4 N/A N/A Photos: N/A Shawn [name] ([id number]) [id number] Page 4 of 15 Right, Distal, Medial, Posterior Lower N/A N/A Wound Location: Leg Gradually Appeared N/A N/A Wounding Event: Venous Leg Ulcer N/A N/A Primary Etiology: Anemia, Angina, Arrhythmia, N/A N/A Comorbid History: Hypertension, Neuropathy 11/10/2023 N/A N/A Date Acquired: 0 N/A N/A Weeks of Treatment: Open N/A N/A Wound Status: No N/A N/A Wound Recurrence: 0.4x0.3x0.1 N/A N/A Measurements L x W x D (cm) 0.094 N/A N/A A (cm) : rea 0.009 N/A N/A Volume (cm) : N/A N/A N/A % Reduction in A rea: N/A N/A N/A % Reduction in Volume: Full Thickness Without Exposed N/A N/A Classification: Support Structures Medium N/A N/A Exudate A mount: Serosanguineous N/A N/A Exudate Type: [name], brown N/A N/A Exudate Color: Distinct, outline attached N/A N/A Wound  Margin: Medium (34-66%) N/A N/A Granulation A mount: [name] N/A N/A Granulation Quality: Medium (34-66%) N/A N/A Necrotic A mount: Fat Layer (Subcutaneous Tissue): Yes N/A N/A Exposed Structures: Fascia: No Tendon: No Muscle: No Joint: No Bone: No Small (1-33%) N/A N/A Epithelialization: Debridement - Excisional N/A N/A Debridement: Pre-procedure Verification/Time Out 09:00 N/A N/A Taken: Lidocaine 4% Topical Solution N/A N/A Pain Control: Subcutaneous, Slough N/A N/A Tissue Debrided: Skin/Subcutaneous Tissue N/A N/A Level: 0.09 N/A N/A Debridement A (sq cm): rea Curette N/A  N/A Instrument: Minimum N/A N/A Bleeding: Pressure N/A N/A Hemostasis A chieved: 0 N/A N/A Procedural Pain: 0 N/A N/A Post Procedural Pain: Procedure was tolerated well N/A N/A Debridement Treatment Response: 0.4x0.3x0.1 N/A N/A Post Debridement Measurements L x W x D (cm) 0.009 N/A N/A Post Debridement Volume: (cm) Excoriation: No N/A N/A Periwound Skin Texture: Induration: No Callus: No Crepitus: No Rash: No Scarring: No Maceration: No N/A N/A Periwound Skin Moisture: Dry/Scaly: No Hemosiderin Staining: Yes N/A N/A Periwound Skin Color: Atrophie Blanche: No Cyanosis: No Ecchymosis: No Erythema: No Mottled: No Pallor: No Rubor: No N/A N/A N/A Temperature: Debridement N/A N/A Procedures Performed: Treatment Notes Wound #1 (Lower Leg) Wound Laterality: Right, Medial Cleanser Soap and Water Discharge Instruction: May shower and wash wound with dial antibacterial soap and water prior to dressing change. Vashe 5.8 (oz) Discharge Instruction: Cleanse the wound with Vashe prior to applying a clean dressing using gauze sponges, not tissue or cotton balls. Shawn [name], Shawn [name] ([id number]) [id number] Page 5 of 15 Peri-Wound Care Skin Prep Discharge Instruction: Use skin prep as directed Topical Primary Dressing Hydrofera Blue Ready Transfer Foam, 2.5x2.5 (in/in) Discharge Instruction: Apply directly to wound bed as directed Secondary Dressing Zetuvit Plus Silicone Border Dressing 4x4 (in/in) Discharge Instruction: Apply silicone border over primary dressing as directed. Secured With Compression Wrap Compression Stockings Add-Ons Wound #2 (Lower Leg) Wound Laterality: Right, Medial, Proximal Cleanser Soap and Water Discharge Instruction: May shower and wash wound with dial antibacterial soap and water prior to dressing change. Vashe 5.8 (oz) Discharge Instruction: Cleanse the wound with Vashe prior to applying a clean dressing using gauze  sponges, not tissue or cotton balls. Peri-Wound Care Skin Prep Discharge Instruction: Use skin prep as directed Topical Primary Dressing Hydrofera Blue Ready Transfer Foam, 2.5x2.5 (in/in) Discharge Instruction: Apply directly to wound bed as directed Secondary Dressing Zetuvit Plus Silicone Border Dressing 4x4 (in/in) Discharge Instruction: Apply silicone border over primary dressing as directed. Secured With Compression Wrap Compression Stockings Add-Ons Wound #3 (Lower Leg) Wound Laterality: Right, Medial, Anterior, Distal Cleanser Soap and Water Discharge Instruction: May shower and wash wound with dial antibacterial soap and water prior to dressing change. Vashe 5.8 (oz) Discharge Instruction: Cleanse the wound with Vashe prior to applying a clean dressing using gauze sponges, not tissue or cotton balls. Peri-Wound Care Skin Prep Discharge Instruction: Use skin prep as directed Topical Primary Dressing Hydrofera Blue Ready Transfer Foam, 2.5x2.5 (in/in) Discharge Instruction: Apply directly to wound bed as directed Secondary Dressing Zetuvit Plus Silicone Border Dressing 4x4 (in/in) Discharge Instruction: Apply silicone border over primary dressing as directed. Secured With [place], [name] ([id number]) [id number] Page 6 of 15 Compression Wrap Compression Stockings Add-Ons Wound #4 (Lower Leg) Wound Laterality: Right, Medial, Posterior, Distal Cleanser Soap and Water Discharge Instruction: May shower and wash wound with dial antibacterial soap and water prior to dressing change. Vashe 5.8 (oz) Discharge Instruction: Cleanse the wound with Vashe prior to applying a clean dressing using gauze sponges, not tissue or cotton balls.  Peri-Wound Care Skin Prep Discharge Instruction: Use skin prep as directed Topical Primary Dressing Hydrofera Blue Ready Transfer Foam, 2.5x2.5 (in/in) Discharge Instruction: Apply directly to wound bed as  directed Secondary Dressing Zetuvit Plus Silicone Border Dressing 4x4 (in/in) Discharge Instruction: Apply silicone border over primary dressing as directed. Secured With Compression Wrap Compression Stockings Facilities manager) Signed: 11/10/2023 10:07:19 AM By: Duanne Guess MD FACS Entered By: Duanne Guess on 11/10/2023 10:07:18 -------------------------------------------------------------------------------- Multi-Disciplinary Care Plan Details Patient Name: Date of Service: Shawn [name], Shawn [name] 11/10/2023 8:45 A M Medical Record Number: [id number] Patient Account Number: [id number] Date of Birth/Sex: Treating RN: 1961-06-24 (62 y.o. Shawn [name] Primary Care Taylore Hinde: Shawn [name] Other Clinician: Referring Briahnna Harries: Treating Tiny Chaudhary/Extender: Shawn [name] in Treatment: 2 Active Inactive Venous Leg Ulcer Nursing Diagnoses: Knowledge deficit related to disease process and management Goals: Patient will maintain optimal edema control Date Initiated: 10/28/2023 Target Resolution Date: 11/18/2023 Goal Status: Active Interventions: Assess peripheral edema status every visit. Provide education on venous insufficiency Shawn [name], Shawn [name] ([id number]) [id number] Page 7 of 15 Notes: Wound/Skin Impairment Nursing Diagnoses: Impaired tissue integrity Knowledge deficit related to ulceration/compromised skin integrity Goals: Ulcer/skin breakdown will have a volume reduction of 30% by week 4 Date Initiated: 10/28/2023 Target Resolution Date: 11/18/2023 Goal Status: Active Interventions: Assess ulceration(s) every visit Provide education on ulcer and skin care Treatment Activities: Skin care regimen initiated : 10/28/2023 Notes: Electronic Signature(s) Signed: 11/10/2023 1:35:07 PM By: Shawn Stall RN, BSN Entered By: Shawn [name] on 11/10/2023  09:03:47 -------------------------------------------------------------------------------- Pain Assessment Details Patient Name: Date of Service: Shawn [name], Shawn [name] 11/10/2023 8:45 A M Medical Record Number: [id number] Patient Account Number: [id number] Date of Birth/Sex: Treating RN: 02-12-1961 (62 y.o. Shawn [name] Primary Care Sabatino Williard: Shawn [name] Other Clinician: Referring Finn Amos: Treating Kary Sugrue/Extender: Shawn [name] in Treatment: 2 Active Problems Location of Pain Severity and Description of Pain Patient Has Paino No Site Locations Rate the pain. Current Pain Level: 0 Pain Management and Medication Current Pain Management: Medication: No Cold Application: No Rest: No Massage: No Activity: No T.E.N.S.: No Heat Application: No Leg drop or elevation: No Is the Current Pain Management Adequate: Adequate How does your wound impact your activities of daily [place], [place] ([id number]) [id number] Page 8 of 15 Sleep: No Bathing: No Appetite: No Relationship With Others: No Bladder Continence: No Emotions: No Bowel Continence: No Work: No Toileting: No Drive: No Dressing: No Hobbies: No Notes burns at night. Electronic Signature(s) Signed: 11/10/2023 1:35:07 PM By: Shawn Stall RN, BSN Entered By: Shawn [name] on 11/10/2023 08:48:44 -------------------------------------------------------------------------------- Patient/Caregiver Education Details Patient Name: Date of Service: Shawn [name], Shawn [name] 11/26/2024andnbsp8:45 A M Medical Record Number: [id number] Patient Account Number: [id number] Date of Birth/Gender: Treating RN: Oct 29, 1961 (62 y.o. Shawn [name] Primary Care Physician: Shawn [name] Other Clinician: Referring Physician: Treating Physician/Extender: Shawn [name] in Treatment: 2 Education Assessment Education Provided To: Patient Education Topics  Provided Wound/Skin Impairment: Handouts: Caring for Your Ulcer Methods: Explain/Verbal Responses: Reinforcements needed Electronic Signature(s) Signed: 11/10/2023 1:35:07 PM By: Shawn Stall RN, BSN Entered By: Shawn [name] on 11/10/2023 09:03:58 -------------------------------------------------------------------------------- Wound Assessment Details Patient Name: Date of Service: Shawn [name] 11/10/2023 8:45 A M Medical Record Number: [id number] Patient Account Number: [id number] Date of Birth/Sex: Treating RN: January 10, 1961 (62 y.o. Shawn [name] Primary Care Maeven Mcdougall: Shawn [name] Other Clinician: Referring Toretto Tingler: Treating Denny Lave/Extender: Shawn [name] in Treatment: 2 Wound Status Wound Number: 1  Primary Etiology: Venous Leg Ulcer Wound Location: Right, Medial Lower Leg Wound Status: Open Wounding Event: Puncture Notes: biopsy for Rash Date Acquired: 07/29/2023 Comorbid History: Anemia, Angina, Arrhythmia, Hypertension, Neuropathy Weeks Of Treatment: 2 Clustered Wound: No Shawn [name] ([id number]) [id number] Page 9 of 15 Photos Wound Measurements Length: (cm) 2 Width: (cm) 2.5 Depth: (cm) 0.1 Area: (cm) 3.927 Volume: (cm) 0.393 % Reduction in Area: -102.4% % Reduction in Volume: -102.6% Epithelialization: None Tunneling: No Undermining: No Wound Description Classification: Full Thickness Without Exposed Support Structures Wound Margin: Distinct, outline attached Exudate Amount: Medium Exudate Type: Serosanguineous Exudate Color: [name], brown Foul Odor After Cleansing: No Slough/Fibrino Yes Wound Bed Granulation Amount: Small (1-33%) Exposed Structure Granulation Quality: [name] Fascia Exposed: No Necrotic Amount: Large (67-100%) Fat Layer (Subcutaneous Tissue) Exposed: Yes Necrotic Quality: Adherent Slough Tendon Exposed: No Muscle Exposed: No Joint Exposed: No Bone Exposed: No Periwound Skin  Texture Texture Color No Abnormalities Noted: Yes No Abnormalities Noted: Yes Moisture Temperature / Pain No Abnormalities Noted: Yes Temperature: No Abnormality Treatment Notes Wound #1 (Lower Leg) Wound Laterality: Right, Medial Cleanser Soap and Water Discharge Instruction: May shower and wash wound with dial antibacterial soap and water prior to dressing change. Vashe 5.8 (oz) Discharge Instruction: Cleanse the wound with Vashe prior to applying a clean dressing using gauze sponges, not tissue or cotton balls. Peri-Wound Care Skin Prep Discharge Instruction: Use skin prep as directed Topical Primary Dressing Hydrofera Blue Ready Transfer Foam, 2.5x2.5 (in/in) Discharge Instruction: Apply directly to wound bed as directed Secondary Dressing Zetuvit Plus Silicone Border Dressing 4x4 (in/in) Discharge Instruction: Apply silicone border over primary dressing as directed. Secured With Compression Wrap Compression Stockings Add-Ons Shawn [name], Shawn [name] ([id number]) [id number] Page 10 of 15 Electronic Signature(s) Signed: 11/10/2023 1:35:07 PM By: Shawn Stall RN, BSN Entered By: Shawn [name] on 11/10/2023 09:00:02 -------------------------------------------------------------------------------- Wound Assessment Details Patient Name: Date of Service: Shawn [name] 11/10/2023 8:45 A M Medical Record Number: [id number] Patient Account Number: [id number] Date of Birth/Sex: Treating RN: [birth year] (62 y.o. Shawn [name], Shawn [name] Primary Care Arretta Toenjes: Shawn [name] Other Clinician: Referring Audriella Blakeley: Treating Koree Schopf/Extender: Shawn [name] in Treatment: 2 Wound Status Wound Number: 2 Primary Etiology: Venous Leg Ulcer Wound Location: Right, Proximal, Medial Lower Leg Wound Status: Open Wounding Event: Gradually Appeared Comorbid History: Anemia, Angina, Arrhythmia, Hypertension, Neuropathy Date Acquired: 10/27/2023 Weeks Of  Treatment: 0 Clustered Wound: No Photos Wound Measurements Length: (cm) 1.4 Width: (cm) 1.2 Depth: (cm) 0.1 Area: (cm) 1.319 Volume: (cm) 0.132 % Reduction in Area: % Reduction in Volume: Epithelialization: Small (1-33%) Tunneling: No Undermining: No Wound Description Classification: Full Thickness Without Exposed Support Wound Margin: Distinct, outline attached Exudate Amount: Medium Exudate Type: Serosanguineous Exudate Color: [name], brown Structures Foul Odor After Cleansing: No Slough/Fibrino Yes Wound Bed Granulation Amount: Large (67-100%) Exposed Structure Granulation Quality: [name], Pink Fascia Exposed: No Necrotic Amount: Small (1-33%) Fat Layer (Subcutaneous Tissue) Exposed: Yes Necrotic Quality: Adherent Slough Tendon Exposed: No Muscle Exposed: No Joint Exposed: No Bone Exposed: No Periwound Skin Texture Texture Color No Abnormalities Noted: No No Abnormalities Noted: No Callus: No Atrophie Blanche: No Crepitus: No Cyanosis: No Excoriation: No Ecchymosis: No Induration: No Erythema: No Shawn [name], Shawn [name] ([id number]) [id number] Page 11 of 15 Induration: No Erythema: No Rash: No Hemosiderin Staining: Yes Scarring: No Mottled: No Pallor: No Moisture Rubor: No No Abnormalities Noted: No Dry / Scaly: No Temperature / Pain Maceration: No Temperature: No Abnormality Treatment Notes Wound #2 (Lower Leg) Wound Laterality: Right, Medial, Proximal Cleanser  Soap and Water Discharge Instruction: May shower and wash wound with dial antibacterial soap and water prior to dressing change. Vashe 5.8 (oz) Discharge Instruction: Cleanse the wound with Vashe prior to applying a clean dressing using gauze sponges, not tissue or cotton balls. Peri-Wound Care Skin Prep Discharge Instruction: Use skin prep as directed Topical Primary Dressing Hydrofera Blue Ready Transfer Foam, 2.5x2.5 (in/in) Discharge Instruction: Apply directly to wound  bed as directed Secondary Dressing Zetuvit Plus Silicone Border Dressing 4x4 (in/in) Discharge Instruction: Apply silicone border over primary dressing as directed. Secured With Compression Wrap Compression Stockings Facilities manager) Signed: 11/10/2023 1:35:07 PM By: Shawn Stall RN, BSN Entered By: Shawn [name] on 11/10/2023 08:59:22 -------------------------------------------------------------------------------- Wound Assessment Details Patient Name: Date of Service: Shawn [name] 11/10/2023 8:45 A M Medical Record Number: [id number] Patient Account Number: [id number] Date of Birth/Sex: Treating RN: 06-26-1961 (62 y.o. Shawn [name] Primary Care Dixie Coppa: Effie [name] Other Clinician: Referring Dekisha Mesmer: Treating Dior Stepter/Extender: Shawn [name] in Treatment: 2 Wound Status Wound Number: 3 Primary Etiology: Venous Leg Ulcer Wound Location: Right, Distal, Medial, Anterior Lower Leg Wound Status: Open Wounding Event: Gradually Appeared Comorbid History: Anemia, Angina, Arrhythmia, Hypertension, Neuropathy Date Acquired: 11/10/2023 Weeks Of Treatment: 0 Clustered Wound: No Photos [place], [name] ([id number]) [id number] Page 12 of 15 Wound Measurements Length: (cm) 0.2 Width: (cm) 0.2 Depth: (cm) 0.1 Area: (cm) 0.031 Volume: (cm) 0.003 % Reduction in Area: % Reduction in Volume: Epithelialization: None Tunneling: No Undermining: No Wound Description Classification: Full Thickness Without Exposed Support Structures Wound Margin: Distinct, outline attached Exudate Amount: Medium Exudate Type: Serosanguineous Exudate Color: [name], brown Foul Odor After Cleansing: No Slough/Fibrino Yes Wound Bed Granulation Amount: Medium (34-66%) Exposed Structure Granulation Quality: [name], Pink Fascia Exposed: No Necrotic Amount: Medium (34-66%) Fat Layer (Subcutaneous Tissue) Exposed: Yes Necrotic Quality:  Adherent Slough Tendon Exposed: No Muscle Exposed: No Joint Exposed: No Bone Exposed: No Periwound Skin Texture Texture Color No Abnormalities Noted: No No Abnormalities Noted: No Callus: No Atrophie Blanche: No Crepitus: No Cyanosis: No Excoriation: No Ecchymosis: No Induration: No Erythema: No Rash: No Hemosiderin Staining: Yes Scarring: No Mottled: No Pallor: No Moisture Rubor: No No Abnormalities Noted: No Dry / Scaly: No Temperature / Pain Maceration: No Temperature: No Abnormality Treatment Notes Wound #3 (Lower Leg) Wound Laterality: Right, Medial, Anterior, Distal Cleanser Soap and Water Discharge Instruction: May shower and wash wound with dial antibacterial soap and water prior to dressing change. Vashe 5.8 (oz) Discharge Instruction: Cleanse the wound with Vashe prior to applying a clean dressing using gauze sponges, not tissue or cotton balls. Peri-Wound Care Skin Prep Discharge Instruction: Use skin prep as directed Topical Primary Dressing Hydrofera Blue Ready Transfer Foam, 2.5x2.5 (in/in) Discharge Instruction: Apply directly to wound bed as directed Secondary Dressing Zetuvit Plus Silicone Border Dressing 4x4 (in/in) Discharge Instruction: Apply silicone border over primary dressing as directed. Shawn [name], Shawn [name] ([id number]) [id number] Page 13 of 15 Secured With Compression Wrap Compression Stockings Facilities manager) Signed: 11/10/2023 1:35:07 PM By: Shawn Stall RN, BSN Entered By: Shawn [name] on 11/10/2023 09:00:47 -------------------------------------------------------------------------------- Wound Assessment Details Patient Name: Date of Service: Shawn [name] 11/10/2023 8:45 A M Medical Record Number: [id number] Patient Account Number: [id number] Date of Birth/Sex: Treating RN: 08-05-1961 (62 y.o. Shawn [name] Primary Care Alleta Avery: Shawn [name] Other Clinician: Referring  Atthew Coutant: Treating Shawn [name]/Extender: Shawn [name] in Treatment: 2 Wound Status Wound Number: 4 Primary Etiology: Venous Leg Ulcer Wound Location: Right, Distal,  Medial, Posterior Lower Leg Wound Status: Open Wounding Event: Gradually Appeared Comorbid History: Anemia, Angina, Arrhythmia, Hypertension, Neuropathy Date Acquired: 11/10/2023 Weeks Of Treatment: 0 Clustered Wound: No Photos Wound Measurements Length: (cm) 0.4 Width: (cm) 0.3 Depth: (cm) 0.1 Area: (cm) 0.094 Volume: (cm) 0.009 % Reduction in Area: % Reduction in Volume: Epithelialization: Small (1-33%) Tunneling: No Undermining: No Wound Description Classification: Full Thickness Without Exposed Support Wound Margin: Distinct, outline attached Exudate Amount: Medium Exudate Type: Serosanguineous Exudate Color: [name], brown Structures Foul Odor After Cleansing: No Slough/Fibrino Yes Wound Bed Granulation Amount: Medium (34-66%) Exposed Structure Granulation Quality: [name] Fascia Exposed: No Necrotic Amount: Medium (34-66%) Fat Layer (Subcutaneous Tissue) Exposed: Yes Necrotic Quality: Adherent Slough Tendon Exposed: No Muscle Exposed: No Joint Exposed: No Bone Exposed: No [address] [place], [name] ([id number]) [id number] Page 14 of 15 Texture Color No Abnormalities Noted: No No Abnormalities Noted: No Callus: No Atrophie Blanche: No Crepitus: No Cyanosis: No Excoriation: No Ecchymosis: No Induration: No Erythema: No Rash: No Hemosiderin Staining: Yes Scarring: No Mottled: No Pallor: No Moisture Rubor: No No Abnormalities Noted: No Dry / Scaly: No Maceration: No Treatment Notes Wound #4 (Lower Leg) Wound Laterality: Right, Medial, Posterior, Distal Cleanser Soap and Water Discharge Instruction: May shower and wash wound with dial antibacterial soap and water prior to dressing change. Vashe 5.8 (oz) Discharge Instruction:  Cleanse the wound with Vashe prior to applying a clean dressing using gauze sponges, not tissue or cotton balls. Peri-Wound Care Skin Prep Discharge Instruction: Use skin prep as directed Topical Primary Dressing Hydrofera Blue Ready Transfer Foam, 2.5x2.5 (in/in) Discharge Instruction: Apply directly to wound bed as directed Secondary Dressing Zetuvit Plus Silicone Border Dressing 4x4 (in/in) Discharge Instruction: Apply silicone border over primary dressing as directed. Secured With Compression Wrap Compression Stockings Facilities manager) Signed: 11/10/2023 1:35:07 PM By: Shawn Stall RN, BSN Entered By: Shawn [name] on 11/10/2023 09:01:16 -------------------------------------------------------------------------------- Vitals Details Patient Name: Date of Service: Shawn [name], Shawn [name] 11/10/2023 8:45 A M Medical Record Number: [id number] Patient Account Number: [id number] Date of Birth/Sex: Treating RN: [birth year] (62 y.o. Shawn [name] Primary Care Donat Humble: Shawn [name] Other Clinician: Referring Quantavia Frith: Treating Damar Petit/Extender: Shawn [name] in Treatment: 2 Vital Signs Time Taken: 08:49 Temperature (F): 97.8 Height (in): 70 Pulse (bpm): 77 Weight (lbs): 204 Respiratory Rate (breaths/min): 20 Body Mass Index (BMI): 29.3 Blood Pressure (mmHg): 121/72 Reference Range: 80 - 120 mg / dl Shawn [name] ([id number]) [id number] Page 15 of 15 Electronic Signature(s) Signed: 11/10/2023 1:35:07 PM By: Shawn Stall RN, BSN Entered By: Shawn [name] on 11/10/2023 08:50:23

## 2023-11-10 NOTE — Progress Notes (Signed)
POE, RINIKER ([id number]) [id number] Page 1 of 13 Visit Report for 11/10/2023 Chief Complaint Document Details Patient Name: Date of Service: Shawn [name] 11/10/2023 8:45 A M Medical Record Number: [id number] Patient Account Number: [id number] Date of Birth/Sex: Treating RN: 1961-09-19 (62 y.o. M) Primary Care Provider: Effie Shy Other Clinician: Referring Provider: Treating Provider/Extender: Mallie Mussel in Treatment: 2 Information Obtained from: Patient Chief Complaint Patient presents to the wound care center with open non-healing surgical wound(s) in the setting of venous insufficiency Electronic Signature(s) Signed: 11/10/2023 10:07:34 AM By: Duanne Guess MD FACS Entered By: Duanne Guess on 11/10/2023 10:07:34 -------------------------------------------------------------------------------- Debridement Details Patient Name: Date of Service: Shawn [name], Shawn [name] 11/10/2023 8:45 A M Medical Record Number: [id number] Patient Account Number: [id number] Date of Birth/Sex: Treating RN: Nov 12, 1961 (62 y.o. Shawn [name] Primary Care Provider: Effie Shy Other Clinician: Referring Provider: Treating Provider/Extender: Mallie Mussel in Treatment: 2 Debridement Performed for Assessment: Wound #1 Right,Medial Lower Leg Performed By: Physician Duanne Guess, MD The following information was scribed by: Shawn Stall The information was scribed for: Duanne Guess Debridement Type: Debridement Severity of Tissue Pre Debridement: Fat layer exposed Level of Consciousness (Pre-procedure): Awake and Alert Pre-procedure Verification/Time Out Yes - 09:00 Taken: Start Time: 09:01 Pain Control: Lidocaine 4% T opical Solution Percent of Wound Bed Debrided: 100% T Area Debrided (cm): otal 3.92 Tissue and other material debrided: Viable, Non-Viable, Slough, Subcutaneous, Slough Level:  Skin/Subcutaneous Tissue Debridement Description: Excisional Instrument: Curette Bleeding: Minimum Hemostasis Achieved: Pressure End Time: 09:10 Procedural Pain: 0 Post Procedural Pain: 0 Response to Treatment: Procedure was tolerated well Level of Consciousness (Post- Awake and Alert procedure): Post Debridement Measurements of Total Wound Length: (cm) 2 Width: (cm) 2.5 Depth: (cm) 0.1 Shawn [name] ([id number]) [id number] Page 2 of 13 Volume: (cm) 0.393 Character of Wound/Ulcer Post Debridement: Improved Severity of Tissue Post Debridement: Fat layer exposed Post Procedure Diagnosis Same as Pre-procedure Electronic Signature(s) Signed: 11/10/2023 10:29:44 AM By: Duanne Guess MD FACS Signed: 11/10/2023 1:35:07 PM By: Shawn Stall RN, BSN Entered By: Shawn Stall on 11/10/2023 09:11:50 -------------------------------------------------------------------------------- Debridement Details Patient Name: Date of Service: Shawn [name], Shawn [name] 11/10/2023 8:45 A M Medical Record Number: [id number] Patient Account Number: [id number] Date of Birth/Sex: Treating RN: 1961-09-06 (62 y.o. Shawn [name], Shawn [name] Primary Care Provider: Effie Shy Other Clinician: Referring Provider: Treating Provider/Extender: Mallie Mussel in Treatment: 2 Debridement Performed for Assessment: Wound #2 Right,Proximal,Medial Lower Leg Performed By: Physician Duanne Guess, MD The following information was scribed by: Shawn Stall The information was scribed for: Duanne Guess Debridement Type: Debridement Severity of Tissue Pre Debridement: Fat layer exposed Level of Consciousness (Pre-procedure): Awake and Alert Pre-procedure Verification/Time Out Yes - 09:00 Taken: Start Time: 09:01 Pain Control: Lidocaine 4% T opical Solution Percent of Wound Bed Debrided: 100% T Area Debrided (cm): otal 1.32 Tissue and other material debrided: Viable,  Non-Viable, Slough, Subcutaneous, Slough Level: Skin/Subcutaneous Tissue Debridement Description: Excisional Instrument: Curette Bleeding: Minimum Hemostasis Achieved: Pressure End Time: 09:10 Procedural Pain: 0 Post Procedural Pain: 0 Response to Treatment: Procedure was tolerated well Level of Consciousness (Post- Awake and Alert procedure): Post Debridement Measurements of Total Wound Length: (cm) 1.4 Width: (cm) 1.2 Depth: (cm) 0.1 Volume: (cm) 0.132 Character of Wound/Ulcer Post Debridement: Improved Severity of Tissue Post Debridement: Fat layer exposed Post Procedure Diagnosis Same as Pre-procedure Electronic Signature(s) Signed: 11/10/2023 10:29:44 AM By: Duanne Guess MD FACS Signed: 11/10/2023 1:35:07 PM By: Shawn Stall RN,  BSN Entered By: Shawn Stall on 11/10/2023 09:12:05 Shawn [name] ([id number]) [id number] Page 3 of 13 -------------------------------------------------------------------------------- Debridement Details Patient Name: Date of Service: Shawn [name] 11/10/2023 8:45 A M Medical Record Number: [id number] Patient Account Number: [id number] Date of Birth/Sex: Treating RN: [birth year] (62 y.o. Shawn [name], Shawn [name] Primary Care Provider: Effie Shy Other Clinician: Referring Provider: Treating Provider/Extender: Mallie Mussel in Treatment: 2 Debridement Performed for Assessment: Wound #3 Right,Distal,Medial,Anterior Lower Leg Performed By: Physician Duanne Guess, MD The following information was scribed by: Shawn Stall The information was scribed for: Duanne Guess Debridement Type: Debridement Severity of Tissue Pre Debridement: Fat layer exposed Level of Consciousness (Pre-procedure): Awake and Alert Pre-procedure Verification/Time Out Yes - 09:00 Taken: Start Time: 09:01 Pain Control: Lidocaine 4% T opical Solution Percent of Wound Bed Debrided: 100% T Area Debrided  (cm): otal 0.03 Tissue and other material debrided: Viable, Non-Viable, Slough, Subcutaneous, Slough Level: Skin/Subcutaneous Tissue Debridement Description: Excisional Instrument: Curette Bleeding: Minimum Hemostasis Achieved: Pressure End Time: 09:10 Procedural Pain: 0 Post Procedural Pain: 0 Response to Treatment: Procedure was tolerated well Level of Consciousness (Post- Awake and Alert procedure): Post Debridement Measurements of Total Wound Length: (cm) 0.2 Width: (cm) 0.2 Depth: (cm) 0.1 Volume: (cm) 0.003 Character of Wound/Ulcer Post Debridement: Improved Severity of Tissue Post Debridement: Fat layer exposed Post Procedure Diagnosis Same as Pre-procedure Electronic Signature(s) Signed: 11/10/2023 10:29:44 AM By: Duanne Guess MD FACS Signed: 11/10/2023 1:35:07 PM By: Shawn Stall RN, BSN Entered By: Shawn Stall on 11/10/2023 09:12:24 -------------------------------------------------------------------------------- Debridement Details Patient Name: Date of Service: Shawn [name], Shawn [name] 11/10/2023 8:45 A M Medical Record Number: [id number] Patient Account Number: [id number] Date of Birth/Sex: Treating RN: 16-Sep-1961 (62 y.o. Shawn [name] Primary Care Provider: Effie Shy Other Clinician: Referring Provider: Treating Provider/Extender: Mallie Mussel in Treatment: 2 Debridement Performed for Assessment: Wound #4 Right,Distal,Medial,Posterior Lower Leg Performed By: Physician Duanne Guess, MD Debridement Type: Debridement Shawn [name] ([id number]) [id number] Page 4 of 13 Severity of Tissue Pre Debridement: Fat layer exposed Level of Consciousness (Pre-procedure): Awake and Alert Pre-procedure Verification/Time Out Yes - 09:00 Taken: Start Time: 09:01 Pain Control: Lidocaine 4% T opical Solution Percent of Wound Bed Debrided: 100% T Area Debrided (cm): otal 0.09 Tissue and other material debrided:  Viable, Non-Viable, Slough, Subcutaneous, Slough Level: Skin/Subcutaneous Tissue Debridement Description: Excisional Instrument: Curette Bleeding: Minimum Hemostasis Achieved: Pressure End Time: 09:10 Procedural Pain: 0 Post Procedural Pain: 0 Response to Treatment: Procedure was tolerated well Level of Consciousness (Post- Awake and Alert procedure): Post Debridement Measurements of Total Wound Length: (cm) 0.4 Width: (cm) 0.3 Depth: (cm) 0.1 Volume: (cm) 0.009 Character of Wound/Ulcer Post Debridement: Improved Severity of Tissue Post Debridement: Fat layer exposed Post Procedure Diagnosis Same as Pre-procedure Electronic Signature(s) Signed: 11/10/2023 10:29:44 AM By: Duanne Guess MD FACS Signed: 11/10/2023 1:35:07 PM By: Shawn Stall RN, BSN Entered By: Shawn Stall on 11/10/2023 09:12:59 -------------------------------------------------------------------------------- HPI Details Patient Name: Date of Service: Shawn [name], Shawn [name] 11/10/2023 8:45 A M Medical Record Number: [id number] Patient Account Number: [id number] Date of Birth/Sex: Treating RN: Jun 28, 1961 (62 y.o. M) Primary Care Provider: Effie Shy Other Clinician: Referring Provider: Treating Provider/Extender: Mallie Mussel in Treatment: 2 History of Present Illness HPI Description: ADMISSION 10/22/2023 ***ABIs R: 1.19*** This is a 62 year old nondiabetic presenting to clinic with an ulcer on his right lower leg. He had a skin biopsy performed in this location to try to determine the etiology of a rash and  the wound failed to heal. He does have venous reflux based upon study performed in July of this year. He was seen by vascular surgery after the study was performed. Reading the vascular surgery note, the rash appeared typical of vasculitis to the provider. He did not think treating the chronic venous insufficiency would make his rash go away as compression and elevation had actually  exacerbated the rash. The patient was encouraged to get a second opinion from a rheumatologist at an academic center and nothing further was done. He has his appointment with an [facility] provider tomorrow. He saw his PCP on September 29, 2023 and was given a 10-day course of Bactrim. He was also referred to the wound care center at that time. 10/28/2023: His wound is actually measuring a little bit larger today. There is a leathery surface and fibrotic texture underneath. 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 Additional serologic testing is pending. He has been started on methotrexate and folic acid. He will be undergoing back surgery next week and has been told that he cannot travel in a car for at least 4 weeks after his procedure. 11/10/2023: After his back surgery, he required a 4-day hospital stay due to postoperative orthostatic hypotension. He says that his wounds were not cared for during that time and today, he has several new open wounds adjacent to the initial existing wound. There is thick slough accumulation on all of the surfaces. Electronic Signature(s) Signed: 11/10/2023 10:10:21 AM By: Duanne Guess MD FACS [place], [name] ([id number]) [id number] Page 5 of 13 Signed: 11/10/2023 10:10:21 AM By: Duanne Guess MD FACS Entered By: Duanne Guess on 11/10/2023 10:10:21 -------------------------------------------------------------------------------- Physical Exam Details Patient Name: Date of Service: Shawn [name] 11/10/2023 8:45 A M Medical Record Number: [id number] Patient Account Number: [id number] Date of Birth/Sex: Treating RN: April 07, 1961 (62 y.o. M) Primary Care Provider: Effie Shy Other Clinician: Referring Provider: Treating Provider/Extender: Mallie Mussel in Treatment: 2 Constitutional . . . . no  acute distress. Respiratory Normal work of breathing on room air.. Notes 11/10/2023: He has several new open wounds adjacent to the initial existing wound. There is thick slough accumulation on all of the surfaces. Electronic Signature(s) Signed: 11/10/2023 10:11:45 AM By: Duanne Guess MD FACS Entered By: Duanne Guess on 11/10/2023 10:11:45 -------------------------------------------------------------------------------- Physician Orders Details Patient Name: Date of Service: Shawn [name], Shawn [name] 11/10/2023 8:45 A M Medical Record Number: [id number] Patient Account Number: [id number] Date of Birth/Sex: Treating RN: 1961/12/12 (62 y.o. 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 - Dr. Leanord Hawking covering Dr. Lady Jens (Front office to schedule) Return A Nurse Visit: - Monday next week 11/16/2023 1245. Other: Shawn [name] DME Anesthetic (In clinic) Topical Lidocaine 5% applied to wound bed - prior to debridement (In clinic) Topical Lidocaine 4% applied to wound bed Bathing/ Shower/ Hygiene May shower with protection but do not get wound dressing(s) wet. Protect dressing(s) with water repellant cover (for example, large plastic bag) or a cast cover and may then  take shower. Edema Control - Orders / Instructions Shawn [name], Shawn [name] ([id number]) [id number] Page 6 of 13 Bilateral Lower Extremities Segmental Compressive Device.  Use the Segmental Compressive Device on leg(s) 2-3 times a day for 45 - 60 minutes. If wearing any wraps or hose, do not remove them. Continue exercising as instructed. Elevate legs to the level of the heart or above for 30 minutes daily and/or when sitting for 3-4 times a day throughout the day. Avoid standing for long periods of time. Wound Treatment Wound #1 - Lower Leg Wound Laterality: Right, Medial Cleanser: Soap and Water Every Other Day/30 Days Discharge Instructions: May shower and wash wound with dial antibacterial soap and water prior to dressing change. Cleanser: Vashe 5.8 (oz) Every Other Day/30 Days Discharge Instructions: Cleanse the wound with Vashe prior to applying a clean dressing using gauze sponges, not tissue or cotton balls. Peri-Wound Care: Skin Prep (DME) (Generic) Every Other Day/30 Days Discharge Instructions: Use skin prep as directed Prim Dressing: Hydrofera Blue Ready Transfer Foam, 2.5x2.5 (in/in) (DME) (Generic) Every Other Day/30 Days ary Discharge Instructions: Apply directly to wound bed as directed Secondary Dressing: Zetuvit Plus Silicone Border Dressing 4x4 (in/in) (DME) (Generic) Every Other Day/30 Days Discharge Instructions: Apply silicone border over primary dressing as directed. Wound #2 - Lower Leg Wound Laterality: Right, Medial, Proximal Cleanser: Soap and Water Every Other Day/30 Days Discharge Instructions: May shower and wash wound with dial antibacterial soap and water prior to dressing change. Cleanser: Vashe 5.8 (oz) Every Other Day/30 Days Discharge Instructions: Cleanse the wound with Vashe prior to applying a clean dressing using gauze sponges, not tissue or cotton balls. Peri-Wound Care: Skin Prep (DME) (Generic) Every Other Day/30 Days Discharge Instructions: Use skin prep as directed Prim Dressing: Hydrofera Blue Ready Transfer Foam, 2.5x2.5 (in/in) (DME) (Generic) Every Other Day/30 Days ary Discharge Instructions: Apply directly to  wound bed as directed Secondary Dressing: Zetuvit Plus Silicone Border Dressing 4x4 (in/in) (DME) (Generic) Every Other Day/30 Days Discharge Instructions: Apply silicone border over primary dressing as directed. Wound #3 - Lower Leg Wound Laterality: Right, Medial, Anterior, Distal Cleanser: Soap and Water Every Other Day/30 Days Discharge Instructions: May shower and wash wound with dial antibacterial soap and water prior to dressing change. Cleanser: Vashe 5.8 (oz) Every Other Day/30 Days Discharge Instructions: Cleanse the wound with Vashe prior to applying a clean dressing using gauze sponges, not tissue or cotton balls. Peri-Wound Care: Skin Prep (DME) (Generic) Every Other Day/30 Days Discharge Instructions: Use skin prep as directed Prim Dressing: Hydrofera Blue Ready Transfer Foam, 2.5x2.5 (in/in) (DME) (Generic) Every Other Day/30 Days ary Discharge Instructions: Apply directly to wound bed as directed Secondary Dressing: Zetuvit Plus Silicone Border Dressing 4x4 (in/in) (DME) (Generic) Every Other Day/30 Days Discharge Instructions: Apply silicone border over primary dressing as directed. Wound #4 - Lower Leg Wound Laterality: Right, Medial, Posterior, Distal Cleanser: Soap and Water Every Other Day/30 Days Discharge Instructions: May shower and wash wound with dial antibacterial soap and water prior to dressing change. Cleanser: Vashe 5.8 (oz) Every Other Day/30 Days Discharge Instructions: Cleanse the wound with Vashe prior to applying a clean dressing using gauze sponges, not tissue or cotton balls. Peri-Wound Care: Skin Prep (DME) (Generic) Every Other Day/30 Days Discharge Instructions: Use skin prep as directed Prim Dressing: Hydrofera Blue Ready Transfer Foam, 2.5x2.5 (in/in) (DME) (Generic) Every Other Day/30 Days ary Discharge Instructions: Apply directly to wound bed as directed Secondary Dressing: Zetuvit Plus Silicone Border Dressing 4x4 (in/in) (DME) (Generic) Every  Other Day/30 Days Discharge Instructions: Apply silicone border over primary dressing as directed. Patient Medications llergies: No Known Allergies A Notifications Medication Indication [address] Shawn [name], Shawn [name] ([id number]) [id number] Page 7 of 13 applied only in clinic for11/26/2024 lidocaine any debridement DOSE topical 4 % cream - cream topical once daily Electronic Signature(s) Signed: 11/10/2023 10:29:44 AM By: Duanne Guess MD FACS Entered By: Duanne Guess on 11/10/2023 10:12:03 -------------------------------------------------------------------------------- Problem List Details Patient Name: Date of Service: Shawn [name], Shawn [name] 11/10/2023 8:45 A M Medical Record Number: [id number] Patient Account Number: [id number] Date of Birth/Sex: Treating RN: 1961-05-02 (62 y.o. 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 M) Primary Care Provider: Effie Shy Other Clinician: Referring Provider: Treating Provider/Extender: Mallie Mussel in Treatment: 2 Subjective Shawn [name] ([id number]) [id number] Page 8 of 13 Chief Complaint Information obtained from Patient Patient presents to the wound care center with open non-healing surgical wound(s) in the setting of venous insufficiency History of Present Illness (HPI) ADMISSION 10/22/2023 ***ABIs R: 1.19*** This is a 62 year old nondiabetic presenting to clinic with an ulcer on his right lower leg. He had a skin biopsy performed in this location to try to determine the etiology of a rash and the wound failed to heal. He does have venous reflux based upon study performed in July of this year. He was seen by vascular surgery after the study was performed. Reading the vascular surgery note, the rash appeared typical of vasculitis to the provider. He did not think treating the chronic venous insufficiency would make his rash go away as compression and elevation had actually exacerbated the rash. The patient was encouraged to get a second opinion from a rheumatologist at an academic center and nothing further was done. He has his appointment with an [facility] provider tomorrow. He saw his PCP on September 29, 2023 and was given a 10-day course of Bactrim. He was also referred to the wound care center at that time. 10/28/2023: His wound is actually measuring a little bit larger today. There is a leathery surface and fibrotic texture underneath. 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 Additional serologic testing is pending. He has been started on methotrexate and folic acid. He will be undergoing back surgery next week and has been told that he cannot travel in a car for at least 4 weeks after his procedure. 11/10/2023: After his back surgery, he required a 4-day hospital stay due to postoperative orthostatic hypotension. He says  that his wounds were not cared for during that time and today, he has several new open wounds adjacent to the initial existing wound. There is thick slough accumulation on all of the surfaces. Patient History Family History Cancer - Siblings, Diabetes - Mother,Maternal Grandparents, Heart Disease, Hypertension, Stroke -  Mother, No family history of Hereditary Spherocytosis, Kidney Disease, Lung Disease, Seizures, Thyroid Problems, Tuberculosis. Social History Never smoker, Marital Status - Married, Alcohol Use - Moderate, Drug Use - No History, Caffeine Use - Daily. Medical History Hematologic/Lymphatic Patient has history of Anemia Cardiovascular Patient has history of Angina, Arrhythmia - Atrial tachycardia, PVCs, Hypertension Endocrine Denies history of Type II Diabetes Neurologic Patient has history of Neuropathy Hospitalization/Surgery History - Colonoscopy with esophagogastroduodenoscopy. - Cystoscopy/retrograde/ureteroscopy/stone extraction with basket (Left). - Cystoscopy w/ ureteral stent placement. - Appendectomy. - Colonoscopy. - back surgery 11/02/23 x4 days inpatient due to BP issues. Medical A Surgical History Notes nd Eyes Retinal detachment Ear/Nose/Mouth/Throat Tinnitus Cardiovascular Aortic atherosclerosis, Claudication of both lower extremities Gastrointestinal Anal fissure, GERD Endocrine Multiple thyroid nodules, Acute renal failure Genitourinary ED, Hypogonadism, Kidney stone Integumentary (Skin) Onychomycosis, Tinea cruris Objective Constitutional no acute distress. Vitals Time Taken: 8:49 AM, Height: 70 in, Weight: 204 lbs, BMI: 29.3, Temperature: 97.8 F, Pulse: 77 bpm, Respiratory Rate: 20 breaths/min, Blood Pressure: 121/72 mmHg. Respiratory Normal work of breathing on room air.. General Notes: 11/10/2023: He has several new open wounds adjacent to the initial existing wound. There is thick slough accumulation on all of the surfaces. Integumentary  (Hair, Skin) Wound #1 status is Open. Original cause of wound was Puncture. The date acquired was: 07/29/2023. The wound has been in treatment 2 weeks. The wound is located on the Right,Medial Lower Leg. The wound measures 2cm length x 2.5cm width x 0.1cm depth; 3.927cm^2 area and 0.393cm^3 volume. There is Fat Layer (Subcutaneous Tissue) exposed. There is no tunneling or undermining noted. There is a medium amount of serosanguineous drainage noted. The wound margin is distinct with the outline attached to the wound base. There is small (1-33%) [name] granulation within the wound bed. There is a large (67-100%) amount of LAM, HICKEN ([id number]) [id number] Page 9 of 13 necrotic tissue within the wound bed including Adherent Slough. The periwound skin appearance had no abnormalities noted for texture. The periwound skin appearance had no abnormalities noted for moisture. The periwound skin appearance had no abnormalities noted for color. Periwound temperature was noted as No Abnormality. Wound #2 status is Open. Original cause of wound was Gradually Appeared. The date acquired was: 10/27/2023. The wound is located on the Right,Proximal,Medial Lower Leg. The wound measures 1.4cm length x 1.2cm width x 0.1cm depth; 1.319cm^2 area and 0.132cm^3 volume. There is Fat Layer (Subcutaneous Tissue) exposed. There is no tunneling or undermining noted. There is a medium amount of serosanguineous drainage noted. The wound margin is distinct with the outline attached to the wound base. There is large (67-100%) [name], pink granulation within the wound bed. There is a small (1-33%) amount of necrotic tissue within the wound bed including Adherent Slough. The periwound skin appearance exhibited: Hemosiderin Staining. The periwound skin appearance did not exhibit: Callus, Crepitus, Excoriation, Induration, Rash, Scarring, Dry/Scaly, Maceration, Atrophie Blanche, Cyanosis, Ecchymosis, Mottled,  Pallor, Rubor, Erythema. Periwound temperature was noted as No Abnormality. Wound #3 status is Open. Original cause of wound was Gradually Appeared. The date acquired was: 11/10/2023. The wound is located on the Right,Distal,Medial,Anterior Lower Leg. The wound measures 0.2cm length x 0.2cm width x 0.1cm depth; 0.031cm^2 area and 0.003cm^3 volume. There is Fat Layer (Subcutaneous Tissue) exposed. There is no tunneling or undermining noted. There is a medium amount of serosanguineous drainage noted. The wound margin is distinct with the outline attached to the wound base. There is medium (34-66%) [name], pink granulation within the wound bed. There  is a medium (34-66%) amount of necrotic tissue within the wound bed including Adherent Slough. The periwound skin appearance exhibited: Hemosiderin Staining. The periwound skin appearance did not exhibit: Callus, Crepitus, Excoriation, Induration, Rash, Scarring, Dry/Scaly, Maceration, Atrophie Blanche, Cyanosis, Ecchymosis, Mottled, Pallor, Rubor, Erythema. Periwound temperature was noted as No Abnormality. Wound #4 status is Open. Original cause of wound was Gradually Appeared. The date acquired was: 11/10/2023. The wound is located on the Right,Distal,Medial,Posterior Lower Leg. The wound measures 0.4cm length x 0.3cm width x 0.1cm depth; 0.094cm^2 area and 0.009cm^3 volume. There is Fat Layer (Subcutaneous Tissue) exposed. There is no tunneling or undermining noted. There is a medium amount of serosanguineous drainage noted. The wound margin is distinct with the outline attached to the wound base. There is medium (34-66%) [name] granulation within the wound bed. There is a medium (34-66%) amount of necrotic tissue within the wound bed including Adherent Slough. The periwound skin appearance exhibited: Hemosiderin Staining. The periwound skin appearance did not exhibit: Callus, Crepitus, Excoriation, Induration, Rash, Scarring, Dry/Scaly, Maceration, Atrophie  Blanche, Cyanosis, Ecchymosis, Mottled, Pallor, Rubor, Erythema. 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 There was a Excisional Skin/Subcutaneous Tissue Debridement with a total area of 3.92 sq cm performed by Duanne Guess, MD. With the following instrument(s): Curette to remove Viable and Non-Viable tissue/material. Material removed includes Subcutaneous Tissue and Slough and after achieving pain control using Lidocaine 4% T opical Solution. A time out was conducted at 09:00, prior to the start of the procedure. A Minimum amount of bleeding was controlled with Pressure. The procedure was tolerated well with a pain level of 0 throughout and a pain level of 0 following the procedure. Post Debridement Measurements: 2cm length x 2.5cm width x 0.1cm depth; 0.393cm^3 volume. Character of Wound/Ulcer Post Debridement is improved. Severity of Tissue Post Debridement is: Fat layer exposed. Post procedure Diagnosis Wound #1: Same as Pre-Procedure Wound #2 Pre-procedure diagnosis of Wound #2 is a Venous Leg Ulcer located on the Right,Proximal,Medial Lower Leg .Severity of Tissue Pre Debridement is: Fat layer exposed. There was a Excisional Skin/Subcutaneous Tissue Debridement with a total area of 1.32 sq cm performed by Duanne Guess, MD. With the following instrument(s): Curette to remove Viable and Non-Viable tissue/material. Material removed includes Subcutaneous Tissue and Slough and after achieving pain control using Lidocaine 4% Topical Solution. A time out was conducted at 09:00, prior to the start of the procedure. A Minimum amount of bleeding was  controlled with Pressure. The procedure was tolerated well with a pain level of 0 throughout and a pain level of 0 following the procedure. Post Debridement Measurements: 1.4cm length x 1.2cm width x 0.1cm depth; 0.132cm^3 volume. Character of Wound/Ulcer Post Debridement is improved. Severity of Tissue Post Debridement is: Fat layer exposed. Post procedure Diagnosis Wound #2: Same as Pre-Procedure Wound #3 Pre-procedure diagnosis of Wound #3 is a Venous Leg Ulcer located on the Right,Distal,Medial,Anterior Lower Leg .Severity of Tissue Pre Debridement is: Fat layer exposed. There was a Excisional Skin/Subcutaneous Tissue Debridement with a total area of 0.03 sq cm performed by Duanne Guess, MD. With the following instrument(s): Curette to remove Viable and Non-Viable tissue/material. Material removed includes Subcutaneous Tissue and Slough and after achieving pain control using  Lidocaine 4% Topical Solution. A time out was conducted at 09:00, prior to the start of the procedure. A Minimum amount of bleeding was controlled with Pressure. The procedure was tolerated well with a pain level of 0 throughout and a pain level of 0 following the procedure. Post Debridement Measurements: 0.2cm length x 0.2cm width x 0.1cm depth; 0.003cm^3 volume. Character of Wound/Ulcer Post Debridement is improved. Severity of Tissue Post Debridement is: Fat layer exposed. Post procedure Diagnosis Wound #3: Same as Pre-Procedure Wound #4 Pre-procedure diagnosis of Wound #4 is a Venous Leg Ulcer located on the Right,Distal,Medial,Posterior Lower Leg .Severity of Tissue Pre Debridement is: Fat layer exposed. There was a Excisional Skin/Subcutaneous Tissue Debridement with a total area of 0.09 sq cm performed by Duanne Guess, MD. With the following instrument(s): Curette to remove Viable and Non-Viable tissue/material. Material removed includes Subcutaneous Tissue and Slough and after achieving pain control using  Lidocaine 4% Topical Solution. A time out was conducted at 09:00, prior to the start of the procedure. A Minimum amount of bleeding was controlled with Pressure. The procedure was tolerated well with a pain level of 0 throughout and a pain level of 0 following the procedure. Post Debridement Measurements: 0.4cm length x 0.3cm width x 0.1cm depth; 0.009cm^3 volume. Character of Wound/Ulcer Post Debridement is improved. Severity of Tissue Post Debridement is: Fat layer exposed. Post procedure Diagnosis Wound #4: Same as Pre-Procedure Shawn [name], Shawn [name] ([id number]) [id number] Page 10 of 13 Plan Follow-up Appointments: Return Appointment in 2 weeks. - Dr. Leanord Hawking covering Dr. Lady Crecencio (Front office to schedule) Nurse Visit: - Monday next week 11/16/2023 1245. Other: Shawn [name] DME Anesthetic: (In clinic) Topical Lidocaine 5% applied to wound bed - prior to debridement (In clinic) Topical Lidocaine 4% applied to wound bed Bathing/ Shower/ Hygiene: May shower with protection but do not get wound dressing(s) wet. Protect dressing(s) with water repellant cover (for example, large plastic bag) or a cast cover and may then take shower. Edema Control - Orders / Instructions: Segmental Compressive Device. Use the Segmental Compressive Device on leg(s) 2-3 times a day for 45 - 60 minutes. If wearing any wraps or hose, do not remove them. Continue exercising as instructed. Elevate legs to the level of the heart or above for 30 minutes daily and/or when sitting for 3-4 times a day throughout the day. Avoid standing for long periods of time. The following medication(s) was prescribed: lidocaine topical 4 % cream cream topical once daily for applied only in clinic for any debridement was prescribed at facility WOUND #1: - Lower Leg Wound Laterality: Right, Medial Cleanser: Soap and Water Every Other Day/30 Days Discharge Instructions: May shower and wash wound with dial antibacterial soap  and water prior to dressing change. Cleanser: Vashe 5.8 (oz) Every Other Day/30 Days Discharge Instructions: Cleanse the wound with Vashe prior to applying a clean dressing using gauze sponges, not tissue or cotton balls. Peri-Wound Care: Skin Prep (DME) (Generic) Every Other Day/30 Days Discharge Instructions: Use skin prep as directed Prim Dressing: Hydrofera Blue Ready Transfer Foam, 2.5x2.5 (in/in) (DME) (Generic) Every Other Day/30 Days ary Discharge Instructions: Apply directly to wound bed as directed Secondary Dressing: Zetuvit Plus Silicone Border Dressing 4x4 (in/in) (DME) (Generic) Every Other Day/30 Days Discharge Instructions: Apply silicone border over primary dressing as directed. WOUND #2: - Lower Leg Wound Laterality: Right, Medial, Proximal Cleanser: Soap and Water Every Other Day/30 Days Discharge Instructions: May shower and wash wound with dial antibacterial soap and water prior to dressing change.  Cleanser: Vashe 5.8 (oz) Every Other Day/30 Days Discharge Instructions: Cleanse the wound with Vashe prior to applying a clean dressing using gauze sponges, not tissue or cotton balls. Peri-Wound Care: Skin Prep (DME) (Generic) Every Other Day/30 Days Discharge Instructions: Use skin prep as directed Prim Dressing: Hydrofera Blue Ready Transfer Foam, 2.5x2.5 (in/in) (DME) (Generic) Every Other Day/30 Days ary Discharge Instructions: Apply directly to wound bed as directed Secondary Dressing: Zetuvit Plus Silicone Border Dressing 4x4 (in/in) (DME) (Generic) Every Other Day/30 Days Discharge Instructions: Apply silicone border over primary dressing as directed. WOUND #3: - Lower Leg Wound Laterality: Right, Medial, Anterior, Distal Cleanser: Soap and Water Every Other Day/30 Days Discharge Instructions: May shower and wash wound with dial antibacterial soap and water prior to dressing change. Cleanser: Vashe 5.8 (oz) Every Other Day/30 Days Discharge Instructions: Cleanse the  wound with Vashe prior to applying a clean dressing using gauze sponges, not tissue or cotton balls. Peri-Wound Care: Skin Prep (DME) (Generic) Every Other Day/30 Days Discharge Instructions: Use skin prep as directed Prim Dressing: Hydrofera Blue Ready Transfer Foam, 2.5x2.5 (in/in) (DME) (Generic) Every Other Day/30 Days ary Discharge Instructions: Apply directly to wound bed as directed Secondary Dressing: Zetuvit Plus Silicone Border Dressing 4x4 (in/in) (DME) (Generic) Every Other Day/30 Days Discharge Instructions: Apply silicone border over primary dressing as directed. WOUND #4: - Lower Leg Wound Laterality: Right, Medial, Posterior, Distal Cleanser: Soap and Water Every Other Day/30 Days Discharge Instructions: May shower and wash wound with dial antibacterial soap and water prior to dressing change. Cleanser: Vashe 5.8 (oz) Every Other Day/30 Days Discharge Instructions: Cleanse the wound with Vashe prior to applying a clean dressing using gauze sponges, not tissue or cotton balls. Peri-Wound Care: Skin Prep (DME) (Generic) Every Other Day/30 Days Discharge Instructions: Use skin prep as directed Prim Dressing: Hydrofera Blue Ready Transfer Foam, 2.5x2.5 (in/in) (DME) (Generic) Every Other Day/30 Days ary Discharge Instructions: Apply directly to wound bed as directed Secondary Dressing: Zetuvit Plus Silicone Border Dressing 4x4 (in/in) (DME) (Generic) Every Other Day/30 Days Discharge Instructions: Apply silicone border over primary dressing as directed. 11/10/2023: After his back surgery, he required a 4-day hospital stay due to postoperative orthostatic hypotension. He says that his wounds were not cared for during that time and today, he has several new open wounds adjacent to the initial existing wound. There is thick slough accumulation on all of the surfaces. I used a curette to debride slough and subcutaneous tissue from his existing wound, as well as all of the new wounds. I  am going to use [facility] as the contact layer to try and clean these up a little bit more. I am going to discontinue the topical corticosteroid as he is now on methotrexate again after his operation. His wife has been changing the dressings at home and she will continue to do so. He will have a nurse visit in about a week and follow-up with Dr. Leanord Hawking in 2 weeks. I will see him when I return to clinic. Electronic Signature(s) Signed: 11/10/2023 10:13:34 AM By: Duanne Guess MD FACS Previous Signature: 11/10/2023 10:12:55 AM Version By: Duanne Guess MD FACS Entered By: Duanne Guess on 11/10/2023 10:13:34 Shawn [name] ([id number]) [id number] Page 11 of 13 -------------------------------------------------------------------------------- HxROS Details Patient Name: Date of Service: Shawn [name] 11/10/2023 8:45 A M Medical Record Number: [id number] Patient Account Number: [id number] Date of Birth/Sex: Treating RN: April 05, 1961 (62 y.o. Shawn [name] Primary Care Provider: Effie Shy Other Clinician: Referring Provider:  Treating Provider/Extender: Mallie Mussel in Treatment: 2 Eyes Medical History: Past Medical History Notes: Retinal detachment Ear/Nose/Mouth/Throat Medical History: Past Medical History Notes: Tinnitus Hematologic/Lymphatic Medical History: Positive for: Anemia Cardiovascular Medical History: Positive for: Angina; Arrhythmia - Atrial tachycardia, PVCs; Hypertension Past Medical History Notes: Aortic atherosclerosis, Claudication of both lower extremities Gastrointestinal Medical History: Past Medical History Notes: Anal fissure, GERD Endocrine Medical History: Negative for: Type II Diabetes Past Medical History Notes: Multiple thyroid nodules, Acute renal failure Genitourinary Medical History: Past Medical History Notes: ED, Hypogonadism, Kidney stone Integumentary (Skin) Medical  History: Past Medical History Notes: Onychomycosis, Tinea cruris Neurologic Medical History: Positive for: Neuropathy Immunizations Pneumococcal Vaccine: Received Pneumococcal Vaccination: No Implantable Devices [place], [place] ([id number]) [id number] Page 12 of 13 None Hospitalization / Surgery History Type of Hospitalization/Surgery Colonoscopy with esophagogastroduodenoscopy Cystoscopy/retrograde/ureteroscopy/stone extraction with basket (Left) Cystoscopy w/ ureteral stent placement Appendectomy Colonoscopy back surgery 11/02/23 x4 days inpatient due to BP issues Family and Social History Cancer: Yes - Siblings; Diabetes: Yes - Mother,Maternal Grandparents; Heart Disease: Yes; Hereditary Spherocytosis: No; Hypertension: Yes; Kidney Disease: No; Lung Disease: No; Seizures: No; Stroke: Yes - Mother; Thyroid Problems: No; Tuberculosis: No; Never smoker; Marital Status - Married; Alcohol Use: Moderate; Drug Use: No History; Caffeine Use: Daily; Financial Concerns: No; Food, Clothing or Shelter Needs: No; Support System Lacking: No; Transportation Concerns: No Electronic Signature(s) Signed: 11/10/2023 10:29:44 AM By: Duanne Guess MD FACS Signed: 11/10/2023 1:35:07 PM By: Shawn Stall RN, BSN Entered By: Duanne Guess on 11/10/2023 10:11:13 -------------------------------------------------------------------------------- SuperBill Details Patient Name: Date of Service: Shawn [name], Shawn [name] 11/10/2023 Medical Record Number: [id number] Patient Account Number: [id number] Date of Birth/Sex: Treating RN: 09/07/1961 (62 y.o. 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 Facility Procedures : CPT4 Code: [id number] Description: 11042 - DEB SUBQ TISSUE 20 SQ CM/< ICD-10 Diagnosis Description L97.812 Non-pressure chronic ulcer of other part of right lower leg with fat layer exp Modifier: osed Quantity: 1 Physician Procedures : CPT4 Code Description Modifier [id number] 99214 - WC PHYS LEVEL 4 - EST PT 25 ICD-10 Diagnosis Description L97.812 Non-pressure chronic ulcer of other part of right lower leg with fat layer exposed I87.2 Venous insufficiency (chronic) (peripheral) M31.30  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

## 2023-11-16 ENCOUNTER — Encounter (HOSPITAL_BASED_OUTPATIENT_CLINIC_OR_DEPARTMENT_OTHER): Payer: PRIVATE HEALTH INSURANCE | Attending: Internal Medicine | Admitting: Internal Medicine

## 2023-11-16 DIAGNOSIS — L97812 Non-pressure chronic ulcer of other part of right lower leg with fat layer exposed: Secondary | ICD-10-CM | POA: Insufficient documentation

## 2023-11-16 DIAGNOSIS — I10 Essential (primary) hypertension: Secondary | ICD-10-CM | POA: Diagnosis not present

## 2023-11-16 DIAGNOSIS — M3130 Wegener's granulomatosis without renal involvement: Secondary | ICD-10-CM | POA: Diagnosis not present

## 2023-11-16 DIAGNOSIS — I872 Venous insufficiency (chronic) (peripheral): Secondary | ICD-10-CM | POA: Diagnosis not present

## 2023-11-16 DIAGNOSIS — D649 Anemia, unspecified: Secondary | ICD-10-CM | POA: Diagnosis not present

## 2023-11-16 NOTE — Progress Notes (Signed)
[place], Shawn [name] ([id number]) [id number] Page 1 of 8 Visit Report for 11/16/2023 Arrival Information Details Patient Name: Date of Service: Shawn [name] 11/16/2023 12:45 PM Medical Record Number: [id number] Patient Account Number: [id number] Date of Birth/Sex: Treating RN: [name] (62 y.o. M) Primary Care Shawn [name]: Shawn [name] Other Clinician: Referring Shawn [name]: Treating Shawn [name]/Extender: Shawn [name] in Treatment: 3 Visit Information History Since Last Visit Added or deleted any medications: No Patient Arrived: Ambulatory Any new allergies or adverse reactions: No Arrival Time: 12:55 Had a fall or experienced change in No Accompanied By: wife activities of daily living that may affect Transfer Assistance: None risk of falls: Patient Identification Verified: Yes Signs or symptoms of abuse/neglect since last visito No Secondary Verification Process Completed: Yes Hospitalized since last visit: No Patient Requires Transmission-Based Precautions: No Implantable device outside of the clinic excluding No Patient Has Alerts: No cellular tissue based products placed in the center since last visit: Has Dressing in Place as Prescribed: Yes Pain Present Now: No Electronic Signature(s) Signed: 11/16/2023 3:18:23 PM By: Shawn [name] Entered By: Shawn [name] on 11/16/2023 13:07:16 -------------------------------------------------------------------------------- Clinic Level of Care Assessment Details Patient Name: Date of Service: Shawn [name] 11/16/2023 12:45 PM Medical Record Number: [id number] Patient Account Number: [id number] Date of Birth/Sex: Treating RN: Shawn [name] (62 y.o. M) Primary Care Shawn [name]: Shawn [name] Other Clinician: Thayer [name] Referring Shawn [name]: Treating Shawn [name]/Extender: Shawn [name] in Treatment: 3 Clinic Level of Care Assessment Items TOOL 4 Quantity Score X- 1 0 Use  when only an EandM is performed on FOLLOW-UP visit ASSESSMENTS - Nursing Assessment / Reassessment X- 1 10 Reassessment of Co-morbidities (includes updates in patient status) X- 1 5 Reassessment of Adherence to Treatment Plan ASSESSMENTS - Wound and Skin A ssessment / Reassessment []  - 0 Simple Wound Assessment / Reassessment - one wound []  - 0 Complex Wound Assessment / Reassessment - multiple wounds []  - 0 Dermatologic / Skin Assessment (not related to wound area) ASSESSMENTS - Focused Assessment []  - 0 Circumferential Edema Measurements - multi extremities []  - 0 Nutritional Assessment / Counseling / Intervention Shawn [name] ([id number]) [id number] Page 2 of 8 []  - 0 Lower Extremity Assessment (monofilament, tuning fork, pulses) []  - 0 Peripheral Arterial Disease Assessment (using hand held doppler) ASSESSMENTS - Ostomy and/or Continence Assessment and Care []  - 0 Incontinence Assessment and Management []  - 0 Ostomy Care Assessment and Management (repouching, etc.) PROCESS - Coordination of Care X - Simple Patient / Family Education for ongoing care 1 15 []  - 0 Complex (extensive) Patient / Family Education for ongoing care []  - 0 Staff obtains Chiropractor, Records, T Results / Process Orders est []  - 0 Staff telephones HHA, Nursing Homes / Clarify orders / etc []  - 0 Routine Transfer to another Facility (non-emergent condition) []  - 0 Routine Hospital Admission (non-emergent condition) []  - 0 New Admissions / Manufacturing engineer / Ordering NPWT Apligraf, etc. , []  - 0 Emergency Hospital Admission (emergent condition) X- 1 10 Simple Discharge Coordination []  - 0 Complex (extensive) Discharge Coordination PROCESS - Special Needs []  - 0 Pediatric / Minor Patient Management []  - 0 Isolation Patient Management []  - 0 Hearing / Language / Visual special needs []  - 0 Assessment of Community assistance (transportation, D/C planning,  etc.) []  - 0 Additional assistance / Altered mentation []  - 0 Support Surface(s) Assessment (bed, cushion, seat, etc.) INTERVENTIONS - Wound Cleansing / Measurement []  - 0 Simple Wound Cleansing - one wound X-  4 5 Complex Wound Cleansing - multiple wounds []  - 0 Wound Imaging (photographs - any number of wounds) []  - 0 Wound Tracing (instead of photographs) []  - 0 Simple Wound Measurement - one wound []  - 0 Complex Wound Measurement - multiple wounds INTERVENTIONS - Wound Dressings X - Small Wound Dressing one or multiple wounds 4 10 []  - 0 Medium Wound Dressing one or multiple wounds []  - 0 Large Wound Dressing one or multiple wounds []  - 0 Application of Medications - topical []  - 0 Application of Medications - injection INTERVENTIONS - Miscellaneous []  - 0 External ear exam []  - 0 Specimen Collection (cultures, biopsies, blood, body fluids, etc.) []  - 0 Specimen(s) / Culture(s) sent or taken to Lab for analysis []  - 0 Patient Transfer (multiple staff / Nurse, adult / Similar devices) []  - 0 Simple Staple / Suture removal (25 or less) []  - 0 Complex Staple / Suture removal (26 or more) []  - 0 Hypo / Hyperglycemic Management (close monitor of Blood Glucose) Shawn [name] ([id number]) [id number] Page 3 of 8 []  - 0 Ankle / Brachial Index (ABI) - do not check if billed separately []  - 0 Vital Signs Has the patient been seen at the hospital within the last three years: Yes Total Score: 100 Level Of Care: New/Established - Level 3 Electronic Signature(s) Signed: 11/16/2023 3:18:23 PM By: Shawn [name] Entered By: Shawn [name] on 11/16/2023 13:22:22 -------------------------------------------------------------------------------- Encounter Discharge Information Details Patient Name: Date of Service: Shawn [name] 11/16/2023 12:45 PM Medical Record Number: [id number] Patient Account Number: [id number] Date of Birth/Sex: Treating  RN: [birth year] (62 y.o. M) Primary Care Shawn [name]: Shawn [name] Other Clinician: Thayer [name] Referring Shawn [name]: Treating Shawn [name]/Extender: Shawn [name] in Treatment: 3 Encounter Discharge Information Items Discharge Condition: Stable Ambulatory Status: Ambulatory Discharge Destination: Home Transportation: Private Auto Accompanied By: wife Schedule Follow-up Appointment: Yes Clinical Summary of Care: Electronic Signature(s) Signed: 11/16/2023 3:18:23 PM By: Shawn [name] Entered By: Shawn [name] on 11/16/2023 13:23:08 -------------------------------------------------------------------------------- Patient/Caregiver Education Details Patient Name: Date of Service: Shawn [name] 12/2/2024andnbsp12:45 PM Medical Record Number: [id number] Patient Account Number: [id number] Date of Birth/Gender: Treating RN: [birth year] (62 y.o. M) Primary Care Physician: Shawn [name] Other Clinician: Thayer [name] Referring Physician: Treating Physician/Extender: Shawn [name] in Treatment: 3 Education Assessment Education Provided To: Patient Education Topics Provided Electronic Signature(s) Signed: 11/16/2023 3:18:23 PM By: Shawn [name] Entered By: Shawn [name] on 11/16/2023 13:22:51 Shawn [name] ([id number]) [id number] Page 4 of 8 -------------------------------------------------------------------------------- Wound Assessment Details Patient Name: Date of Service: Shawn [name] 11/16/2023 12:45 PM Medical Record Number: [id number] Patient Account Number: [id number] Date of Birth/Sex: Treating RN: [name] (62 y.o. M) Primary Care Batina Dougan: Shawn [name] Other Clinician: Referring Lariza Cothron: Treating Keymora Grillot/Extender: Shawn [name] in Treatment: 3 Wound Status Wound Number: 1 Primary Etiology: Venous Leg Ulcer Wound Location: Right, Medial Lower Leg Wound Status:  Open Wounding Event: Puncture Notes: biopsy for Rash Date Acquired: 07/29/2023 Weeks Of Treatment: 3 Clustered Wound: No Wound Measurements Length: (cm) 2 Width: (cm) 2.5 Depth: (cm) 0.1 Area: (cm) 3.927 Volume: (cm) 0.393 % Reduction in Area: -102.4% % Reduction in Volume: -102.6% Wound Description Classification: Full Thickness Without Exposed Support Exudate Amount: Medium Exudate Type: Serosanguineous Exudate Color: red, brown Structures Periwound Skin Texture Texture Color No Abnormalities Noted: No No Abnormalities Noted: No Moisture No Abnormalities Noted: No Treatment Notes Wound #1 (Lower Leg) Wound Laterality: Right, Medial Cleanser Soap and Water Discharge  Instruction: May shower and wash wound with dial antibacterial soap and water prior to dressing change. Vashe 5.8 (oz) Discharge Instruction: Cleanse the wound with Vashe prior to applying a clean dressing using gauze sponges, not tissue or cotton balls. Peri-Wound Care Skin Prep Discharge Instruction: Use skin prep as directed Topical Primary Dressing Hydrofera Blue Ready Transfer Foam, 2.5x2.5 (in/in) Discharge Instruction: Apply directly to wound bed as directed Secondary Dressing Zetuvit Plus Silicone Border Dressing 4x4 (in/in) Discharge Instruction: Apply silicone border over primary dressing as directed. Secured With Compression Wrap Compression Stockings Add-Ons Laura ([id number]) [id number] Page 5 of 8 Electronic Signature(s) Signed: 11/16/2023 3:18:23 PM By: Shawn [name] Entered By: Shawn [name] on 11/16/2023 13:07:59 -------------------------------------------------------------------------------- Wound Assessment Details Patient Name: Date of Service: Shawn [name] 11/16/2023 12:45 PM Medical Record Number: [id number] Patient Account Number: [id number] Date of Birth/Sex: Treating RN: [birth year] (62 y.o. M) Primary Care Shayle Donahoo: Shawn [name] Other  Clinician: Referring Joyceann Kruser: Treating Katiana Ruland/Extender: Shawn [name] in Treatment: 3 Wound Status Wound Number: 2 Primary Etiology: Venous Leg Ulcer Wound Location: Right, Proximal, Medial Lower Leg Wound Status: Open Wounding Event: Gradually Appeared Date Acquired: 10/27/2023 Weeks Of Treatment: 0 Clustered Wound: No Wound Measurements Length: (cm) 1.4 Width: (cm) 1.2 Depth: (cm) 0.1 Area: (cm) 1.319 Volume: (cm) 0.132 % Reduction in Area: 0% % Reduction in Volume: 0% Wound Description Classification: Full Thickness Without Exposed Suppor Exudate Amount: Medium Exudate Type: Serosanguineous Exudate Color: red, brown t Structures Periwound Skin Texture Texture Color No Abnormalities Noted: No No Abnormalities Noted: No Moisture No Abnormalities Noted: No Treatment Notes Wound #2 (Lower Leg) Wound Laterality: Right, Medial, Proximal Cleanser Soap and Water Discharge Instruction: May shower and wash wound with dial antibacterial soap and water prior to dressing change. Vashe 5.8 (oz) Discharge Instruction: Cleanse the wound with Vashe prior to applying a clean dressing using gauze sponges, not tissue or cotton balls. Peri-Wound Care Skin Prep Discharge Instruction: Use skin prep as directed Topical Primary Dressing Hydrofera Blue Ready Transfer Foam, 2.5x2.5 (in/in) Discharge Instruction: Apply directly to wound bed as directed Secondary Dressing Zetuvit Plus Silicone Border Dressing 4x4 (in/in) Discharge Instruction: Apply silicone border over primary dressing as directed. Secured With [place], Shawn [name] ([id number]) [id number] Page 6 of 8 Compression Wrap Compression Stockings Add-Ons Electronic Signature(s) Signed: 11/16/2023 3:18:23 PM By: Shawn [name] Entered By: Shawn [name] on 11/16/2023 13:07:59 -------------------------------------------------------------------------------- Wound Assessment  Details Patient Name: Date of Service: Shawn [name] 11/16/2023 12:45 PM Medical Record Number: [id number] Patient Account Number: [id number] Date of Birth/Sex: Treating RN: [name] (62 y.o. M) Primary Care Emillia Weatherly: Shawn [name] Other Clinician: Referring Gabriel Paulding: Treating Daivd Fredericksen/Extender: Shawn [name] in Treatment: 3 Wound Status Wound Number: 3 Primary Etiology: Venous Leg Ulcer Wound Location: Right, Distal, Medial, Anterior Lower Leg Wound Status: Open Wounding Event: Gradually Appeared Date Acquired: 11/10/2023 Weeks Of Treatment: 0 Clustered Wound: No Wound Measurements Length: (cm) 0.2 Width: (cm) 0.2 Depth: (cm) 0.1 Area: (cm) 0.031 Volume: (cm) 0.003 % Reduction in Area: 0% % Reduction in Volume: 0% Wound Description Classification: Full Thickness Without Exposed Suppor Exudate Amount: Medium Exudate Type: Serosanguineous Exudate Color: red, brown t Structures Periwound Skin Texture Texture Color No Abnormalities Noted: No No Abnormalities Noted: No Moisture No Abnormalities Noted: No Treatment Notes Wound #3 (Lower Leg) Wound Laterality: Right, Medial, Anterior, Distal Cleanser Soap and Water Discharge Instruction: May shower and wash wound with dial antibacterial soap and water prior to dressing change. Vashe 5.8 (oz) Discharge  Instruction: Cleanse the wound with Vashe prior to applying a clean dressing using gauze sponges, not tissue or cotton balls. Peri-Wound Care Skin Prep Discharge Instruction: Use skin prep as directed Topical Primary Dressing Hydrofera Blue Ready Transfer Foam, 2.5x2.5 (in/in) Discharge Instruction: Apply directly to wound bed as directed RYDEN, WEIGANDT ([id number]) [id number] Page 7 of 8 Secondary Dressing Zetuvit Plus Silicone Border Dressing 4x4 (in/in) Discharge Instruction: Apply silicone border over primary dressing as directed. Secured With Compression  Wrap Compression Stockings Add-Ons Electronic Signature(s) Signed: 11/16/2023 3:18:23 PM By: Shawn [name] Entered By: Shawn [name] on 11/16/2023 13:07:59 -------------------------------------------------------------------------------- Wound Assessment Details Patient Name: Date of Service: Shawn [name] 11/16/2023 12:45 PM Medical Record Number: [id number] Patient Account Number: [id number] Date of Birth/Sex: Treating RN: [name] (62 y.o. M) Primary Care Jaidan Prevette: Shawn [name] Other Clinician: Referring Nesta Scaturro: Treating Lancelot Alyea/Extender: Shawn [name] in Treatment: 3 Wound Status Wound Number: 4 Primary Etiology: Venous Leg Ulcer Wound Location: Right, Distal, Medial, Posterior Lower Leg Wound Status: Open Wounding Event: Gradually Appeared Date Acquired: 11/10/2023 Weeks Of Treatment: 0 Clustered Wound: No Wound Measurements Length: (cm) 0.4 Width: (cm) 0.3 Depth: (cm) 0.1 Area: (cm) 0.094 Volume: (cm) 0.009 % Reduction in Area: 0% % Reduction in Volume: 0% Wound Description Classification: Full Thickness Without Exposed Suppor Exudate Amount: Medium Exudate Type: Serosanguineous Exudate Color: red, brown t Structures Periwound Skin Texture Texture Color No Abnormalities Noted: No No Abnormalities Noted: No Moisture No Abnormalities Noted: No Treatment Notes Wound #4 (Lower Leg) Wound Laterality: Right, Medial, Posterior, Distal Cleanser Soap and Water Discharge Instruction: May shower and wash wound with dial antibacterial soap and water prior to dressing change. Vashe 5.8 (oz) Discharge Instruction: Cleanse the wound with Vashe prior to applying a clean dressing using gauze sponges, not tissue or cotton balls. Peri-Wound Care Skin Prep Discharge Instruction: Use skin prep as directed SAMY, MCGRANN ([id number]) [id number] Page 8 of 8 Topical Primary Dressing Hydrofera Blue Ready Transfer Foam,  2.5x2.5 (in/in) Discharge Instruction: Apply directly to wound bed as directed Secondary Dressing Zetuvit Plus Silicone Border Dressing 4x4 (in/in) Discharge Instruction: Apply silicone border over primary dressing as directed. Secured With Compression Wrap Compression Stockings Add-Ons Electronic Signature(s) Signed: 11/16/2023 3:18:23 PM By: Shawn [name] Entered By: Shawn [name] on 11/16/2023 13:07:59 -------------------------------------------------------------------------------- Vitals Details Patient Name: Date of Service: Shawn [name] 11/16/2023 12:45 PM Medical Record Number: [id number] Patient Account Number: [id number] Date of Birth/Sex: Treating RN: [name][birth year] (62 y.o. M) Primary Care Makynlie Rossini: Shawn [name] Other Clinician: Referring Jacole Capley: Treating Tierre Gerard/Extender: Shawn [name] in Treatment: 3 Vital Signs Time Taken: 13:07 Reference Range: 80 - 120 mg / dl Height (in): 70 Weight (lbs): 204 Body Mass Index (BMI): 29.3 Electronic Signature(s) Signed: 11/16/2023 3:18:23 PM By: Shawn [name] Entered By: Shawn [name] on 11/16/2023 13:07:31

## 2023-11-17 NOTE — Progress Notes (Signed)
CLANCE, BENDALL ([id number]) [id number] Page 1 of 1 Visit Report for 11/16/2023 SuperBill Details Patient Name: Date of Service: Shawn [name] 11/16/2023 Medical Record Number: [id number] Patient Account Number: [id number] Date of Birth/Sex: Treating RN: [birth year] (62 y.o. 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

## 2023-11-18 ENCOUNTER — Other Ambulatory Visit: Payer: Self-pay

## 2023-11-18 MED ORDER — "BD LUER-LOK SYRINGE 21G X 1-1/2"" 3 ML MISC": 1 refills | Status: DC

## 2023-11-18 NOTE — Telephone Encounter (Signed)
Mrs Lubbers left message on triage line asking for refill on testosterone needles and syringes. Refill sent in and I left a message letting her know this.

## 2023-11-21 ENCOUNTER — Other Ambulatory Visit: Payer: Self-pay

## 2023-11-21 ENCOUNTER — Emergency Department (HOSPITAL_BASED_OUTPATIENT_CLINIC_OR_DEPARTMENT_OTHER)
Admission: EM | Admit: 2023-11-21 | Discharge: 2023-11-21 | Disposition: A | Discharge status: Home / Self Care | Payer: PRIVATE HEALTH INSURANCE | Attending: Emergency Medicine | Admitting: Emergency Medicine

## 2023-11-21 ENCOUNTER — Encounter (HOSPITAL_BASED_OUTPATIENT_CLINIC_OR_DEPARTMENT_OTHER): Payer: Self-pay | Admitting: Emergency Medicine

## 2023-11-21 DIAGNOSIS — S81801D Unspecified open wound, right lower leg, subsequent encounter: Secondary | ICD-10-CM | POA: Diagnosis present

## 2023-11-21 DIAGNOSIS — X58XXXD Exposure to other specified factors, subsequent encounter: Secondary | ICD-10-CM | POA: Diagnosis not present

## 2023-11-21 NOTE — ED Triage Notes (Signed)
Pt via pov from home with wound on his right leg. He has been treated by wound care doc for this wound from a biopsy in June. It has not gotten better and a new blistered and red area showed up last night. Pt alert & oriented, nad noted.

## 2023-11-21 NOTE — Discharge Instructions (Signed)
Follow-up with your wound doctors.  I think the clindamycin that you have been started on for your back will actually help with his any infection in your right lower leg.  Overall I think wound margins are clean and dry.  Continue your wound management as you have done follow-up with your primary doctors.

## 2023-11-21 NOTE — ED Notes (Signed)
Pt's right lower leg wrapped w/ kling from toes to just below knee and secured with coban. Pt. Is to put on his rx antimicrobial dressing when he gets home.

## 2023-11-21 NOTE — ED Provider Notes (Signed)
[place] EMERGENCY DEPARTMENT AT [facility] Provider Note   CSN: [id number] Arrival date & time: 11/21/23  1847     History  Chief Complaint  Patient presents with   Leg Pain    Shawn [name] is a 62 y.o. male.  Here for evaluation of his chronic right lower leg wound.  He has a small bump now arising next to his chronic site of wounds.  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  Any fevers or chills.  No nausea vomit diarrhea.  The history is provided by the patient.       Home Medications Prior to Admission medications   Medication Sig Start Date End Date Taking? Authorizing Provider  gabapentin (NEURONTIN) 300 MG capsule Take by mouth. Take 1 tab in the morning and 2 tabs in the evening 01/22/22   [provider]  lisinopril (ZESTRIL) 20 MG tablet Take 1 tablet (20 mg total) by mouth daily. 08/31/23   Riley Lam A, MD  metoprolol succinate (TOPROL-XL) 50 MG 24 hr tablet Take 1 tablet (50 mg total) by mouth daily. 10/24/22   Chandrasekhar, Rondel Jumbo, MD  mupirocin ointment (BACTROBAN) 2 % 1 Application 2 (two) times daily.    [provider]  pantoprazole (PROTONIX) 20 MG tablet Take 1 tablet (20 mg total) by mouth daily. 05/26/23   Napoleon Form, MD  pantoprazole (PROTONIX) 40 MG tablet TAKE 1 TABLET (40 MG TOTAL) BY MOUTH TWICE A DAY BEFORE MEALS 10/16/23   Nandigam, Eleonore Chiquito, MD  SYRINGE-NEEDLE, DISP, 3 ML (B-D 3CC LUER-LOK SYR 21GX1-1/2) 21G X 1-1/2" 3 ML MISC USE AS DIRECTED FOR TESTOSTERONE ADMINISTRATION 11/18/23   Stoioff, Verna Czech, MD  testosterone cypionate (DEPOTESTOSTERONE CYPIONATE) 200 MG/ML injection INJECT 1.5 MLS INTO THE MUSCLE EVERY 14 DAYS. 10/14/23   Stoioff, Verna Czech, MD      Allergies    Patient has no known allergies.    Review of Systems    Review of Systems  Physical Exam Updated Vital Signs BP 132/81 (BP Location: Right Arm)   Pulse 89   Temp 98.1 F (36.7 C) (Oral)   Resp 18   Ht 5\' 11"  (1.803 m)   Wt 89.4 kg   SpO2 100%   BMI 27.49 kg/m  Physical Exam Vitals and nursing note reviewed.  Constitutional:      General: He is not in acute distress.    Appearance: He is well-developed.  HENT:     Head: Normocephalic and atraumatic.  Eyes:     Conjunctiva/sclera: Conjunctivae normal.  Cardiovascular:     Rate and Rhythm: Normal rate and regular rhythm.     Heart sounds: No murmur heard. Pulmonary:     Effort: Pulmonary effort is normal. No respiratory distress.     Breath sounds: Normal breath sounds.  Abdominal:     Palpations: Abdomen is soft.     Tenderness: There is no abdominal tenderness.  Musculoskeletal:        General: No swelling.     Cervical back: Neck supple.  Skin:    General: Skin is warm and dry.     Capillary Refill: Capillary refill takes less than 2 seconds.     Comments: Multiple superficial ulcers to the right shin with clean wound margins, there is a smaller than dime size slightly  raised blister next to one of his chronic ulcers that he is concerned about  Neurological:     Mental Status: He is alert.  Psychiatric:        Mood and Affect: Mood normal.     ED Results / Procedures / Treatments   Labs (all labs ordered are listed, but only abnormal results are displayed) Labs Reviewed - No data to display  EKG None  Radiology No results found.  Procedures Procedures    Medications Ordered in ED Medications - No data to display  ED Course/ Medical Decision Making/ A&P                                 Medical Decision Making  Shawn [name] is here for evaluation of a small blister that appears to be new next to one of his chronic ulcer sites on his right shin.  He has history of vasculitis now methotrexate.  He is dealing with a back infection and just darted on  clindamycin and Augmentin.  Overall his right lower leg wound appears to be doing very well.  Wound margins are clean dry and intact on his multiple ulcers to his right shin.  I do not think there is a new infectious process.  He has a new small blister that I think could turn into an eventually very small ulcer.  Overall he is already starting clindamycin for back infection which would help any small cellulitis that could be going on the right lower leg.  He is ready following with wound care.  He is neurovascular neuromuscular intact on exam.  He is got a little bit of numbness in his right lower leg which is chronic.  Overall is good good pulses.  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  There does not appear to be any systemic process.  His vital signs are reassuring.  Recommend continued wound care management per wound care team.  Discharged in good condition.  Understands return precautions.  This chart was dictated using voice recognition software.  Despite best efforts to proofread,  errors can occur which can change the documentation meaning.         Final Clinical Impression(s) / ED Diagnoses Final diagnoses:  Wound of right lower extremity, subsequent encounter    Rx / DC Orders ED Discharge Orders     None         Virgina Norfolk, DO 11/21/23 1955

## 2023-11-24 ENCOUNTER — Encounter (HOSPITAL_BASED_OUTPATIENT_CLINIC_OR_DEPARTMENT_OTHER): Payer: PRIVATE HEALTH INSURANCE | Admitting: Internal Medicine

## 2023-11-24 DIAGNOSIS — L97812 Non-pressure chronic ulcer of other part of right lower leg with fat layer exposed: Secondary | ICD-10-CM | POA: Diagnosis not present

## 2023-11-25 NOTE — Progress Notes (Signed)
Shawn [name], Shawn [name] ([id number]) [id number] Page 1 of 7 Visit Report for 11/24/2023 HPI Details Patient Name: Date of Service: Shawn [name] 11/24/2023 8:00 A M Medical Record Number: [id number] Patient Account Number: [id number] Date of Birth/Sex: Treating RN: December 13, 1961 (62 y.o. M) Primary Care Provider: Melissa Noon Other Clinician: Referring Provider: Treating Provider/Extender: Gentry Fitz in Treatment: 4 History of Present Illness HPI Description: ADMISSION 10/22/2023 ***ABIs R: 1.19*** This is a 62 year old nondiabetic presenting to clinic with an ulcer on his right lower leg. He had a skin biopsy performed in this location to try to determine the etiology of a rash and the wound failed to heal. He does have venous reflux based upon study performed in July of this year. He was seen by vascular surgery after the study was performed. Reading the vascular surgery note, the rash appeared typical of vasculitis to the provider. He did not think treating the chronic venous insufficiency would make his rash go away as compression and elevation had actually exacerbated the rash. The patient was encouraged to get a second opinion from a rheumatologist at an academic center and nothing further was done. He has his appointment with an [facility] provider tomorrow. He saw his PCP on September 29, 2023 and was given a 10-day course of Bactrim. He was also referred to the wound care center at that time. 10/28/2023: His wound is actually measuring a little bit larger today. There is a leathery surface and fibrotic texture underneath. 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 Additional serologic testing is pending. He has been started on methotrexate and folic acid. He will be undergoing back surgery next week and has been told that he cannot travel in a  car for at least 4 weeks after his procedure. 11/10/2023: After his back surgery, he required a 4-day hospital stay due to postoperative orthostatic hypotension. He says that his wounds were not cared for during that time and today, he has several new open wounds adjacent to the initial existing wound. There is thick slough accumulation on all of the surfaces. 12/10; this is a patient who has bilateral lower extremity vasculitis. 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 He is on six 2.5 mg tablets of methotrexate and folic acid for the last 4 weeks. His follow-up with rheumatology is not until February In terms of other symptoms he says he is had bleeding from both nostrils and he shows pictures of large clumps of dark brown material that comes out of his nose every morning. He is also having pain on the roof of his mouth and the soft palate. He describes episodic neuropathic pain in various parts of his body which at times is excruciating. His wounds are located to the right medial lower leg there is for open areas. Some necrotic areas are forming no doubt skin breakdown from occlusion. We have been putting Hydrofera Blue in these wounds which his wife says helps He has had back surgery as well and apparently some wound dehiscence although he has been followed for this by orthopedic surgery. I have not looked at this today. Electronic Signature(s) Signed: 11/25/2023 10:03:28 AM By: Baltazar Najjar MD Entered By: Baltazar Najjar on 11/24/2023 06:06:10 -------------------------------------------------------------------------------- Physical Exam Details Patient Name: Date of Service: Shawn [name], [name] 11/24/2023 8:00 A M Medical Record Number: [id number] Patient Account  Number: [id number] Date of Birth/Sex: Treating RN: 1961/02/01 (62 y.o. M) Primary Care Provider: Melissa Noon Other Clinician: Referring  Provider: Treating Provider/Extender: Gentry Fitz in Treatment: 4 Constitutional Sitting or standing Blood Pressure is within target range for patient.. Pulse regular and within target range for patient.Marland Kitchen Respirations regular, non-labored and within target range.. Temperature is normal and within the target range for the patient.Marland Kitchen Appears in no distress. Ears, Nose, Mouth, and Throat BOSTIN, VITIELLO ([id number]) [id number] Page 2 of 7 The patient has a erythematous area on the soft palate. Yellow blisters widely in this area. A dark area on the posterior pharynx may be drainage from his nose.Marland Kitchen Respiratory work of breathing is normal. Cardiovascular Pedal pulses palpable and strong bilaterally.. Notes Wound exam; cluster of open wounds on the right medial lower leg the surface of these looks clean. Vasculitic rash in both lower legs with small purple discolored areas. This goes up to about the proximal third of both legs. Electronic Signature(s) Signed: 11/25/2023 10:03:28 AM By: Baltazar Najjar MD Entered By: Baltazar Najjar on 11/24/2023 06:08:54 -------------------------------------------------------------------------------- Physician Orders Details Patient Name: Date of Service: Shawn [name], [name] 11/24/2023 8:00 A M Medical Record Number: [id number] Patient Account Number: [id number] Date of Birth/Sex: Treating RN: [birth year] (62 y.o. Shawn [name] Primary Care Provider: Melissa Noon Other Clinician: Referring Provider: Treating Provider/Extender: Gentry Fitz in Treatment: 4 Verbal / Phone Orders: No Diagnosis Coding Follow-up Appointments Return appointment in 1 month. - w/ Dr. Lady Elisandro May cancel next week's appointment with Dr. Lady Oisin. CAll us if you need to come back before 1 month. Other: Corliss Blacker DME Anesthetic (In clinic) Topical Lidocaine 5% applied to wound bed - prior to debridement (In  clinic) Topical Lidocaine 4% applied to wound bed Bathing/ Shower/ Hygiene May shower with protection but do not get wound dressing(s) wet. Protect dressing(s) with water repellant cover (for example, large plastic bag) or a cast cover and may then take shower. Edema Control - Orders / Instructions Bilateral Lower Extremities Segmental Compressive Device. Use the Segmental Compressive Device on leg(s) 2-3 times a day for 45 - 60 minutes. If wearing any wraps or hose, do not remove them. Continue exercising as instructed. Elevate legs to the level of the heart or above for 30 minutes daily and/or when sitting for 3-4 times a day throughout the day. Avoid standing for long periods of time. Wound Treatment Wound #1 - Lower Leg Wound Laterality: Right, Medial Cleanser: Soap and Water 1 x Per Day/30 Days Discharge Instructions: May shower and wash wound with dial antibacterial soap and water prior to dressing change. Cleanser: Vashe 5.8 (oz) 1 x Per Day/30 Days Discharge Instructions: Cleanse the wound with Vashe prior to applying a clean dressing using gauze sponges, not tissue or cotton balls. Peri-Wound Care: Skin Prep (DME) (Generic) 1 x Per Day/30 Days Discharge Instructions: Use skin prep as directed Prim Dressing: Hydrofera Blue Ready Transfer Foam, 4x5 (in/in) (DME) (Generic) 1 x Per Day/30 Days ary Discharge Instructions: Apply to wound bed as instructed Secondary Dressing: Zetuvit Plus Silicone Border Dressing 7x7(in/in) (DME) (Generic) 1 x Per Day/30 Days Discharge Instructions: Apply silicone border over primary dressing as directed. Wound #2 - Lower Leg Wound Laterality: Right, Medial, Proximal Cleanser: Soap and Water 1 x Per Day/30 Days ALDO, PASSWATER ([id number]) [id number] Page 3 of 7 Discharge Instructions: May shower and wash wound with dial antibacterial soap and water prior to dressing change. Cleanser: Vashe 5.8 (oz)  1 x Per Day/30  Days Discharge Instructions: Cleanse the wound with Vashe prior to applying a clean dressing using gauze sponges, not tissue or cotton balls. Peri-Wound Care: Skin Prep (DME) (Generic) 1 x Per Day/30 Days Discharge Instructions: Use skin prep as directed Prim Dressing: Hydrofera Blue Ready Transfer Foam, 2.5x2.5 (in/in) (DME) (Generic) 1 x Per Day/30 Days ary Discharge Instructions: Apply directly to wound bed as directed Secondary Dressing: Zetuvit Plus Silicone Border Dressing 7x7(in/in) (DME) (Generic) 1 x Per Day/30 Days Discharge Instructions: Apply silicone border over primary dressing as directed. Wound #3 - Lower Leg Wound Laterality: Right, Medial, Anterior, Distal Cleanser: Soap and Water Every Other Day/30 Days Discharge Instructions: May shower and wash wound with dial antibacterial soap and water prior to dressing change. Cleanser: Vashe 5.8 (oz) Every Other Day/30 Days Discharge Instructions: Cleanse the wound with Vashe prior to applying a clean dressing using gauze sponges, not tissue or cotton balls. Peri-Wound Care: Skin Prep (DME) (Generic) Every Other Day/30 Days Discharge Instructions: Use skin prep as directed Prim Dressing: Hydrofera Blue Ready Transfer Foam, 2.5x2.5 (in/in) (DME) (Generic) Every Other Day/30 Days ary Discharge Instructions: Apply directly to wound bed as directed Secondary Dressing: Zetuvit Plus Silicone Border Dressing 7x7(in/in) (DME) (Generic) Every Other Day/30 Days Discharge Instructions: Apply silicone border over primary dressing as directed. Wound #4 - Lower Leg Wound Laterality: Right, Medial, Posterior, Distal Cleanser: Soap and Water Every Other Day/30 Days Discharge Instructions: May shower and wash wound with dial antibacterial soap and water prior to dressing change. Cleanser: Vashe 5.8 (oz) Every Other Day/30 Days Discharge Instructions: Cleanse the wound with Vashe prior to applying a clean dressing using gauze sponges, not tissue or  cotton balls. Peri-Wound Care: Skin Prep (DME) (Generic) Every Other Day/30 Days Discharge Instructions: Use skin prep as directed Prim Dressing: Hydrofera Blue Ready Transfer Foam, 2.5x2.5 (in/in) (DME) (Generic) Every Other Day/30 Days ary Discharge Instructions: Apply directly to wound bed as directed Secondary Dressing: Zetuvit Plus Silicone Border Dressing 7x7(in/in) (DME) (Generic) Every Other Day/30 Days Discharge Instructions: Apply silicone border over primary dressing as directed. Patient Medications llergies: No Known Allergies A Notifications Medication Indication Start End oral thrush 11/24/2023 nystatin DOSE oral 100,000 unit/mL suspension - 5cc suspension oral four times daily for 10 days Electronic Signature(s) Signed: 11/24/2023 9:12:29 AM By: Baltazar Najjar MD Entered By: Baltazar Najjar on 11/24/2023 06:12:28 -------------------------------------------------------------------------------- Problem List Details Patient Name: Date of Service: Shawn [name], [name] 11/24/2023 8:00 A M Medical Record Number: [id number] Patient Account Number: [id number] Date of Birth/Sex: Treating RN: Dec 06, 1961 (62 y.o. 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 M) Primary Care Provider: Melissa Noon Other Clinician: Referring Provider: Treating Provider/Extender: Gentry Fitz in Treatment: 4 Subjective History of Present Illness (HPI) ADMISSION 10/22/2023 ***  ABIs R: 1.19*** This is a 62 year old nondiabetic presenting to clinic with an ulcer on his right lower leg. He had a skin biopsy performed in this location to try to determine the etiology of a rash and the wound failed to heal. He does have venous reflux based upon study performed in July of this year. He was seen by vascular surgery after the study was performed. Reading the vascular surgery note, the rash appeared typical of vasculitis to the provider. He did not think treating the chronic venous insufficiency would make his rash go away as compression and elevation had actually exacerbated the rash. The patient was encouraged to get a second opinion from a rheumatologist at an academic center and nothing further was done. He has his appointment with an [facility] provider tomorrow. He saw his PCP on September 29, 2023 and was given a 10-day course of Bactrim. He was also referred to the wound care center at that time. 10/28/2023: His wound is actually measuring a little bit larger today. There is a leathery surface and fibrotic texture underneath. 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 Additional serologic testing is pending. He has been started on methotrexate and folic acid. He will be undergoing back surgery next week and has been told that he cannot travel in a car  for at least 4 weeks after his procedure. 11/10/2023: After his back surgery, he required a 4-day hospital stay due to postoperative orthostatic hypotension. He says that his wounds were not cared for during that time and today, he has several new open wounds adjacent to the initial existing wound. There is thick slough accumulation on all of the surfaces. 12/10; this is a patient who has bilateral lower extremity vasculitis. 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 He is on six 2.5 mg tablets of methotrexate and folic acid for the last 4 weeks. His follow-up with rheumatology is not until February In terms of other symptoms he says he is had bleeding from both nostrils and he shows pictures of large clumps of dark brown material that comes out of his nose every morning. He is also having pain on the roof of his mouth and the soft palate. He describes episodic neuropathic pain in various parts of his body which at times is excruciating. His wounds are located to the right medial lower leg there is for open areas. Some necrotic areas are forming no doubt skin breakdown from occlusion. We have been putting Hydrofera Blue in these wounds which his wife says helps He has had back surgery as well and apparently some wound dehiscence although he has been followed for this by orthopedic surgery. I have not looked at [place], Shawn [name] ([id number]) [id number] Page 5 of 7 this today. Objective Constitutional Sitting or standing Blood Pressure is within target range for patient.. Pulse regular and within target range for patient.Marland Kitchen Respirations regular, non-labored and within target range.. Temperature is normal and within the target range for the patient.Marland Kitchen Appears in no distress. Vitals Time Taken: 8:02 AM, Height: 70 in, Weight: 204 lbs, BMI: 29.3, Temperature: 97.9 F, Pulse: 94 bpm, Respiratory Rate: 20  breaths/min, Blood Pressure: 122/81 mmHg. Ears, Nose, Mouth, and Throat The patient has a erythematous area on the soft palate. Yellow blisters widely in this area. A dark area on the posterior pharynx may be drainage from his nose.Marland Kitchen Respiratory  work of breathing is normal. Cardiovascular Pedal pulses palpable and strong bilaterally.. General Notes: Wound exam; cluster of open wounds on the right medial lower leg the surface of these looks clean. Vasculitic rash in both lower legs with small purple discolored areas. This goes up to about the proximal third of both legs. Integumentary (Hair, Skin) Wound #1 status is Open. Original cause of wound was Puncture. The date acquired was: 07/29/2023. The wound has been in treatment 4 weeks. The wound is located on the Right,Medial Lower Leg. The wound measures 4.2cm length x 2.4cm width x 0.1cm depth; 7.917cm^2 area and 0.792cm^3 volume. There is a medium amount of serosanguineous drainage noted. Wound #2 status is Open. Original cause of wound was Gradually Appeared. The date acquired was: 10/27/2023. The wound has been in treatment 2 weeks. The wound is located on the Right,Proximal,Medial Lower Leg. The wound measures 2.2cm length x 2.8cm width x 0.1cm depth; 4.838cm^2 area and 0.484cm^3 volume. There is a medium amount of serosanguineous drainage noted. Wound #3 status is Open. Original cause of wound was Gradually Appeared. The date acquired was: 11/10/2023. The wound has been in treatment 2 weeks. The wound is located on the Right,Distal,Medial,Anterior Lower Leg. The wound measures 0.1cm length x 0.1cm width x 0.1cm depth; 0.008cm^2 area and 0.001cm^3 volume. There is a medium amount of serosanguineous drainage noted. Wound #4 status is Open. Original cause of wound was Gradually Appeared. The date acquired was: 11/10/2023. The wound has been in treatment 2 weeks. The wound is located on the Right,Distal,Medial,Posterior Lower Leg. The wound  measures 1cm length x 1.5cm width x 0.1cm depth; 1.178cm^2 area and 0.118cm^3 volume. There is a medium amount of serosanguineous drainage noted. 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 - w/ Dr. Lady Tedd May cancel next week's appointment with Dr. Lady Dexton. CAll us if you need to come back before 1 month. Other: Corliss Blacker DME Anesthetic: (In clinic) Topical Lidocaine 5% applied to wound bed - prior to debridement (In clinic) Topical Lidocaine 4% applied to wound bed Bathing/ Shower/ Hygiene: May shower with protection but do not get wound dressing(s) wet. Protect dressing(s) with water repellant cover (for example, large plastic bag) or a cast cover and may then take shower. Edema Control - Orders / Instructions: Segmental Compressive Device. Use the Segmental Compressive Device on leg(s) 2-3 times a day for 45 - 60 minutes. If wearing any wraps or hose, do not remove them. Continue exercising as instructed. Elevate legs to the level of the heart or above for 30 minutes daily and/or when sitting for 3-4 times a day throughout the day. Avoid standing for long periods of time. The following medication(s) was prescribed: nystatin oral 100,000 unit/mL suspension 5cc suspension oral four times daily for 10 days for oral thrush starting 11/24/2023 WOUND #1: - Lower Leg Wound Laterality: Right, Medial [name] ([id number]) [id number] Page 6 of 7 Cleanser: Soap and Water 1 x Per Day/30 Days Discharge Instructions: May shower and wash wound with dial antibacterial soap and water prior to dressing change. Cleanser: Vashe 5.8 (oz) 1 x Per Day/30 Days Discharge Instructions: Cleanse the wound with Vashe prior to applying a clean dressing using  gauze sponges, not tissue or cotton balls. Peri-Wound Care: Skin Prep (DME) (Generic) 1 x Per Day/30 Days Discharge Instructions: Use skin prep as directed Prim  Dressing: Hydrofera Blue Ready Transfer Foam, 4x5 (in/in) (DME) (Generic) 1 x Per Day/30 Days ary Discharge Instructions: Apply to wound bed as instructed Secondary Dressing: Zetuvit Plus Silicone Border Dressing 7x7(in/in) (DME) (Generic) 1 x Per Day/30 Days Discharge Instructions: Apply silicone border over primary dressing as directed. WOUND #2: - Lower Leg Wound Laterality: Right, Medial, Proximal Cleanser: Soap and Water 1 x Per Day/30 Days Discharge Instructions: May shower and wash wound with dial antibacterial soap and water prior to dressing change. Cleanser: Vashe 5.8 (oz) 1 x Per Day/30 Days Discharge Instructions: Cleanse the wound with Vashe prior to applying a clean dressing using gauze sponges, not tissue or cotton balls. Peri-Wound Care: Skin Prep (DME) (Generic) 1 x Per Day/30 Days Discharge Instructions: Use skin prep as directed Prim Dressing: Hydrofera Blue Ready Transfer Foam, 2.5x2.5 (in/in) (DME) (Generic) 1 x Per Day/30 Days ary Discharge Instructions: Apply directly to wound bed as directed Secondary Dressing: Zetuvit Plus Silicone Border Dressing 7x7(in/in) (DME) (Generic) 1 x Per Day/30 Days Discharge Instructions: Apply silicone border over primary dressing as directed. WOUND #3: - Lower Leg Wound Laterality: Right, Medial, Anterior, Distal Cleanser: Soap and Water Every Other Day/30 Days Discharge Instructions: May shower and wash wound with dial antibacterial soap and water prior to dressing change. Cleanser: Vashe 5.8 (oz) Every Other Day/30 Days Discharge Instructions: Cleanse the wound with Vashe prior to applying a clean dressing using gauze sponges, not tissue or cotton balls. Peri-Wound Care: Skin Prep (DME) (Generic) Every Other Day/30 Days Discharge Instructions: Use skin prep as directed Prim  Dressing: Hydrofera Blue Ready Transfer Foam, 2.5x2.5 (in/in) (DME) (Generic) Every Other Day/30 Days ary Discharge Instructions: Apply directly to wound bed as directed Secondary Dressing: Zetuvit Plus Silicone Border Dressing 7x7(in/in) (DME) (Generic) Every Other Day/30 Days Discharge Instructions: Apply silicone border over primary dressing as directed. WOUND #4: - Lower Leg Wound Laterality: Right, Medial, Posterior, Distal Cleanser: Soap and Water Every Other Day/30 Days Discharge Instructions: May shower and wash wound with dial antibacterial soap and water prior to dressing change. Cleanser: Vashe 5.8 (oz) Every Other Day/30 Days Discharge Instructions: Cleanse the wound with Vashe prior to applying a clean dressing using gauze sponges, not tissue or cotton balls. Peri-Wound Care: Skin Prep (DME) (Generic) Every Other Day/30 Days Discharge Instructions: Use skin prep as directed Prim Dressing: Hydrofera Blue Ready Transfer Foam, 2.5x2.5 (in/in) (DME) (Generic) Every Other Day/30 Days ary Discharge Instructions: Apply directly to wound bed as directed Secondary Dressing: Zetuvit Plus Silicone Border Dressing 7x7(in/in) (DME) (Generic) Every Other Day/30 Days Discharge Instructions: Apply silicone border over primary dressing as directed. 1. Granulomatosis with polyangiitis. He is on 6 2.5 mg methotrexate. No improvement so far although this may be an insufficient time to see improvement 2. I have advised him to go back and see the rheumatologist through at least contact. She may be able to do a virtual visit 3. In terms of the area on his soft palate. I wondered whether this was secondary to the methotrexate or whether this could be oral thrush. I am going to give him nystatin swish and swallow. Give this a week to see if it is any better otherwise contact rheumatology about the possibility of this being related to methotrexate. 4. He is having systemic symptoms. I wonder about a more  aggressive treatment regimen. I have asked them to contact rheumatology. Furthermore I wonder about an academic consultation with o vasculitis clinic 5 in terms of his wounds he is using Hydrofera Blue  with good effect per his wife. I do not think we are going to be able to do anything about this until control of his underlying disease is achieved. Furthermore I do not think coming back here on a weekly basis is going to be helpful.However I gave them the option of continuing weekly appointments. We could consider putting his legs in compression with liberal use of topical steroid Electronic Signature(s) Signed: 11/25/2023 10:03:28 AM By: Baltazar Najjar MD Entered By: Baltazar Najjar on 11/24/2023 06:16:06 -------------------------------------------------------------------------------- SuperBill Details Patient Name: Date of Service: Shawn [name], [place] RY 11/24/2023 Medical Record Number: [id number] Patient Account Number: [id number] Date of Birth/Sex: Treating RN: [birth year] (62 y.o. 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

## 2023-11-25 NOTE — Progress Notes (Signed)
Onton, Shawn [name] ([id number]) [id number] Page 1 of 12 Visit Report for 11/24/2023 Arrival Information Details Patient Name: Date of Service: Shawn [name] 11/24/2023 8:00 A M Medical Record Number: [id number] Patient Account Number: [id number] Date of Birth/Sex: Treating RN: 1961/08/19 (62 y.o. M) Primary Care Mitchell Epling: Melissa Noon Other Clinician: Referring Loreli Debruler: Treating Ellakate Gonsalves/Extender: Gentry Fitz in Treatment: 4 Visit Information History Since Last Visit Added or deleted any medications: No Patient Arrived: Ambulatory Any new allergies or adverse reactions: No Arrival Time: 08:01 Had a fall or experienced change in No Accompanied By: wife activities of daily living that may affect Transfer Assistance: None risk of falls: Patient Identification Verified: Yes Signs or symptoms of abuse/neglect since last visito No Secondary Verification Process Completed: Yes Hospitalized since last visit: No Patient Requires Transmission-Based Precautions: No Implantable device outside of the clinic excluding No Patient Has Alerts: No cellular tissue based products placed in the center since last visit: Pain Present Now: Yes Electronic Signature(s) Signed: 11/24/2023 8:41:39 AM By: Dayton Scrape Entered By: Dayton Scrape on 11/24/2023 05:02:15 -------------------------------------------------------------------------------- Clinic Level of Care Assessment Details Patient Name: Date of Service: Shawn [name] 11/24/2023 8:00 A M Medical Record Number: [id number] Patient Account Number: [id number] Date of Birth/Sex: Treating RN: [birth year] (62 y.o. Shawn [name], Shawn [name] Primary Care Shaterra Sanzone: Melissa Noon Other Clinician: Referring Mihcael Ledee: Treating Doniven Vanpatten/Extender: Gentry Fitz in Treatment: 4 Clinic Level of Care Assessment Items TOOL 4 Quantity Score X- 1 0 Use when only an EandM is performed on  FOLLOW-UP visit ASSESSMENTS - Nursing Assessment / Reassessment X- 1 10 Reassessment of Co-morbidities (includes updates in patient status) X- 1 5 Reassessment of Adherence to Treatment Plan ASSESSMENTS - Wound and Skin A ssessment / Reassessment []  - 0 Simple Wound Assessment / Reassessment - one wound X- 4 5 Complex Wound Assessment / Reassessment - multiple wounds []  - 0 Dermatologic / Skin Assessment (not related to wound area) ASSESSMENTS - Focused Assessment []  - 0 Circumferential Edema Measurements - multi extremities []  - 0 Nutritional Assessment / Counseling / Intervention []  - 0 Lower Extremity Assessment (monofilament, tuning fork, pulses) Leslie, Shawn [name] ([id number]) [id number] Page 2 of 12 []  - 0 Peripheral Arterial Disease Assessment (using hand held doppler) ASSESSMENTS - Ostomy and/or Continence Assessment and Care []  - 0 Incontinence Assessment and Management []  - 0 Ostomy Care Assessment and Management (repouching, etc.) PROCESS - Coordination of Care []  - 0 Simple Patient / Family Education for ongoing care X- 1 20 Complex (extensive) Patient / Family Education for ongoing care X- 1 10 Staff obtains Chiropractor, Records, T Results / Process Orders est []  - 0 Staff telephones HHA, Nursing Homes / Clarify orders / etc []  - 0 Routine Transfer to another Facility (non-emergent condition) []  - 0 Routine Hospital Admission (non-emergent condition) []  - 0 New Admissions / Manufacturing engineer / Ordering NPWT Apligraf, etc. , []  - 0 Emergency Hospital Admission (emergent condition) []  - 0 Simple Discharge Coordination X- 1 15 Complex (extensive) Discharge Coordination PROCESS - Special Needs []  - 0 Pediatric / Minor Patient Management []  - 0 Isolation Patient Management []  - 0 Hearing / Language / Visual special needs []  - 0 Assessment of Community assistance (transportation, D/C planning, etc.) []  - 0 Additional  assistance / Altered mentation []  - 0 Support Surface(s) Assessment (bed, cushion, seat, etc.) INTERVENTIONS - Wound Cleansing / Measurement []  - 0 Simple Wound Cleansing - one wound X- 4 5 Complex Wound  Cleansing - multiple wounds X- 1 5 Wound Imaging (photographs - any number of wounds) []  - 0 Wound Tracing (instead of photographs) []  - 0 Simple Wound Measurement - one wound X- 4 5 Complex Wound Measurement - multiple wounds INTERVENTIONS - Wound Dressings []  - 0 Small Wound Dressing one or multiple wounds X- 4 15 Medium Wound Dressing one or multiple wounds []  - 0 Large Wound Dressing one or multiple wounds X- 1 5 Application of Medications - topical []  - 0 Application of Medications - injection INTERVENTIONS - Miscellaneous []  - 0 External ear exam []  - 0 Specimen Collection (cultures, biopsies, blood, body fluids, etc.) []  - 0 Specimen(s) / Culture(s) sent or taken to Lab for analysis []  - 0 Patient Transfer (multiple staff / Nurse, adult / Similar devices) []  - 0 Simple Staple / Suture removal (25 or less) []  - 0 Complex Staple / Suture removal (26 or more) []  - 0 Hypo / Hyperglycemic Management (close monitor of Blood Glucose) []  - 0 Ankle / Brachial Index (ABI) - do not check if billed separately [name] ([id number]) [id number] Page 3 of 12 X- 1 5 Vital Signs Has the patient been seen at the hospital within the last three years: Yes Total Score: 195 Level Of Care: New/Established - Level 5 Electronic Signature(s) Signed: 11/24/2023 3:58:30 PM By: Fonnie Mu RN Entered By: Fonnie Mu on 11/24/2023 06:01:51 -------------------------------------------------------------------------------- Encounter Discharge Information Details Patient Name: Date of Service: Shawn [name], Shawn [name] 11/24/2023 8:00 A M Medical Record Number: [id number] Patient Account Number: [id number] Date of Birth/Sex: Treating RN: [birth year] (62  y.o. Shawn [name], Shawn [name] Primary Care Dunia Pringle: Melissa Noon Other Clinician: Referring Meghan Warshawsky: Treating Lorrie Strauch/Extender: Gentry Fitz in Treatment: 4 Encounter Discharge Information Items Discharge Condition: Stable Ambulatory Status: Ambulatory Discharge Destination: Home Transportation: Private Auto Accompanied By: wife Schedule Follow-up Appointment: Yes Clinical Summary of Care: Patient Declined Electronic Signature(s) Signed: 11/24/2023 3:58:30 PM By: Fonnie Mu RN Entered By: Fonnie Mu on 11/24/2023 06:02:26 -------------------------------------------------------------------------------- Lower Extremity Assessment Details Patient Name: Date of Service: Shawn [name] 11/24/2023 8:00 A M Medical Record Number: [id number] Patient Account Number: [id number] Date of Birth/Sex: Treating RN: 1961/11/04 (62 y.o. Shawn [name], Shawn [name] Primary Care Rhone Ozaki: Melissa Noon Other Clinician: Referring Lavaris Sexson: Treating Jakyia Gaccione/Extender: Gentry Fitz in Treatment: 4 Edema Assessment Assessed: Kyra Searles: No] [Right: Yes] Edema: [Left: N] [Right: o] Calf Left: Right: Point of Measurement: From Medial Instep 38 cm Ankle Left: Right: Point of Measurement: From Medial Instep 26 cm Vascular Assessment Pulses: [name] ([id number]) [Right:[id number] Page 4 of 12] Dorsalis Pedis Palpable: [Right:Yes] Posterior Tibial Palpable: [Right:Yes] Extremity colors, hair growth, and conditions: Extremity Color: [Right:Normal] Hair Growth on Extremity: [Right:Yes] Temperature of Extremity: [Right:Warm] Capillary Refill: [Right:< 3 seconds] Dependent Rubor: [Right:No No] Electronic Signature(s) Signed: 11/24/2023 3:58:30 PM By: Fonnie Mu RN Entered By: Fonnie Mu on 11/24/2023 05:25:07 -------------------------------------------------------------------------------- Multi Wound Chart  Details Patient Name: Date of Service: Shawn [name], Shawn [name] 11/24/2023 8:00 A M Medical Record Number: [id number] Patient Account Number: [id number] Date of Birth/Sex: Treating RN: 09-20-1961 (62 y.o. M) Primary Care Eladio Dentremont: Melissa Noon Other Clinician: Referring Hansford Hirt: Treating Demorris Choyce/Extender: Gentry Fitz in Treatment: 4 Vital Signs Height(in): 70 Pulse(bpm): 94 Weight(lbs): 204 Blood Pressure(mmHg): 122/81 Body Mass Index(BMI): 29.3 Temperature(F): 97.9 Respiratory Rate(breaths/min): 20 [1:Photos:] Right, Medial Lower Leg Right, Proximal, Medial Lower Leg Right, Distal, Medial, Anterior Lower Wound Location: Leg Puncture Gradually Appeared Gradually Appeared  Wounding Event: Venous Leg Ulcer Venous Leg Ulcer Venous Leg Ulcer Primary Etiology: Anemia, Angina, Arrhythmia, Anemia, Angina, Arrhythmia, Anemia, Angina, Arrhythmia, Comorbid History: Hypertension, Neuropathy Hypertension, Neuropathy Hypertension, Neuropathy 07/29/2023 10/27/2023 11/10/2023 Date Acquired: 4 2 2  Weeks of Treatment: Open Open Open Wound Status: No No No Wound Recurrence: 4.2x2.4x0.1 2.2x2.8x0.1 0.1x0.1x0.1 Measurements L x W x D (cm) 7.917 4.838 0.008 A (cm) : rea 0.792 0.484 0.001 Volume (cm) : -308.10% -266.80% 74.20% % Reduction in Area: -308.20% -266.70% 66.70% % Reduction in Volume: Full Thickness Without Exposed Full Thickness Without Exposed Full Thickness Without Exposed Classification: Support Structures Support Structures Support Structures Medium Medium Medium Exudate Amount: Serosanguineous Serosanguineous Serosanguineous Exudate Type: [name], brown [name], brown [name], brown Exudate Color: Wound Number: 4 N/A N/A Photos: N/A Leanne Lovely ([id number]) [id number] Page 5 of 12 Right, Distal, Medial, Posterior Lower N/A N/A Wound Location: Leg Gradually Appeared N/A N/A Wounding Event: Venous Leg Ulcer N/A N/A Primary  Etiology: Anemia, Angina, Arrhythmia, N/A N/A Comorbid History: Hypertension, Neuropathy 11/10/2023 N/A N/A Date Acquired: 2 N/A N/A Weeks of Treatment: Open N/A N/A Wound Status: No N/A N/A Wound Recurrence: 1x1.5x0.1 N/A N/A Measurements L x W x D (cm) 1.178 N/A N/A A (cm) : rea 0.118 N/A N/A Volume (cm) : -1153.20% N/A N/A % Reduction in Area: -1211.10% N/A N/A % Reduction in Volume: Full Thickness Without Exposed N/A N/A Classification: Support Structures Medium N/A N/A Exudate Amount: Serosanguineous N/A N/A Exudate Type: [name], brown N/A N/A Exudate Color: Treatment Notes Electronic Signature(s) Signed: 11/25/2023 10:03:28 AM By: Baltazar Najjar MD Entered By: Baltazar Najjar on 11/24/2023 06:02:06 -------------------------------------------------------------------------------- Multi-Disciplinary Care Plan Details Patient Name: Date of Service: Shawn [name], Shawn [name] 11/24/2023 8:00 A M Medical Record Number: [id number] Patient Account Number: [id number] Date of Birth/Sex: Treating RN: 1961-07-06 (62 y.o. Shawn [name] Primary Care Javontae Marlette: Melissa Noon Other Clinician: Referring Enola Siebers: Treating Hadyn Azer/Extender: Gentry Fitz in Treatment: 4 Active Inactive Venous Leg Ulcer Nursing Diagnoses: Knowledge deficit related to disease process and management Goals: Patient will maintain optimal edema control Date Initiated: 10/28/2023 Target Resolution Date: 12/12/2023 Goal Status: Active Interventions: Assess peripheral edema status every visit. Provide education on venous insufficiency Notes: Wound/Skin Impairment Nursing Diagnoses: Impaired tissue integrity Knowledge deficit related to ulceration/compromised skin integrity Goals: Ulcer/skin breakdown will have a volume reduction of 30% by week 4 Branch, Shawn [name] ([id number]) [id number] Page 6 of 12 Date Initiated: 10/28/2023 Target Resolution Date:  12/12/2023 Goal Status: Active Interventions: Assess ulceration(s) every visit Provide education on ulcer and skin care Treatment Activities: Skin care regimen initiated : 10/28/2023 Notes: Electronic Signature(s) Signed: 11/24/2023 3:58:30 PM By: Fonnie Mu RN Entered By: Fonnie Mu on 11/24/2023 05:31:16 -------------------------------------------------------------------------------- Pain Assessment Details Patient Name: Date of Service: Shawn [name], Shawn [name] 11/24/2023 8:00 A M Medical Record Number: [id number] Patient Account Number: [id number] Date of Birth/Sex: Treating RN: May 19, 1961 (62 y.o. M) Primary Care Clelia Trabucco: Melissa Noon Other Clinician: Referring Joanette Silveria: Treating Courtny Bennison/Extender: Gentry Fitz in Treatment: 4 Active Problems Location of Pain Severity and Description of Pain Patient Has Paino Yes Site Locations Rate the pain. Current Pain Level: 0 Worst Pain Level: 10 Least Pain Level: 0 Tolerable Pain Level: 2 Pain Management and Medication Current Pain Management: Electronic Signature(s) Signed: 11/24/2023 8:41:39 AM By: Dayton Scrape Entered By: Dayton Scrape on 11/24/2023 05:03:57 -------------------------------------------------------------------------------- Patient/Caregiver Education Details Patient Name: Date of Service: Shawn [name], Shawn [name] 12/10/2024andnbsp8:00 Rosann Auerbach, Shawn [name] ([id number]) [id number] Page 7 of  12 Medical Record Number: [id number] Patient Account Number: [id number] Date of Birth/Gender: Treating RN: [birth year] (62 y.o. Shawn [name] Primary Care Physician: Melissa Noon Other Clinician: Referring Physician: Treating Physician/Extender: Gentry Fitz in Treatment: 4 Education Assessment Education Provided To: Patient Education Topics Provided Wound/Skin Impairment: Methods: Explain/Verbal Responses: Reinforcements needed, State content  correctly Electronic Signature(s) Signed: 11/24/2023 3:58:30 PM By: Fonnie Mu RN Entered By: Fonnie Mu on 11/24/2023 05:31:31 -------------------------------------------------------------------------------- Wound Assessment Details Patient Name: Date of Service: Shawn [name], Shawn [name] 11/24/2023 8:00 A M Medical Record Number: [id number] Patient Account Number: [id number] Date of Birth/Sex: Treating RN: [birth year] (62 y.o. M) Primary Care Jaleeyah Munce: Melissa Noon Other Clinician: Referring Jerrianne Hartin: Treating Danielly Ackerley/Extender: Gentry Fitz in Treatment: 4 Wound Status Wound Number: 1 Primary Etiology: Venous Leg Ulcer Wound Location: Right, Medial Lower Leg Wound Status: Open Wounding Event: Puncture Notes: biopsy for Rash Date Acquired: 07/29/2023 Comorbid History: Anemia, Angina, Arrhythmia, Hypertension, Neuropathy Weeks Of Treatment: 4 Clustered Wound: No Photos Wound Measurements Length: (cm) 4.2 Width: (cm) 2.4 Depth: (cm) 0.1 Area: (cm) 7.917 Volume: (cm) 0.792 % Reduction in Area: -308.1% % Reduction in Volume: -308.2% Wound Description Classification: Full Thickness Without Exposed Support Structures Exudate Amount: Medium Exudate Type: Serosanguineous Exudate Color: [name], brown [name] ([id number]) [id number] Page 8 of 12 Periwound Skin Texture Texture Color No Abnormalities Noted: No No Abnormalities Noted: No Moisture No Abnormalities Noted: No Treatment Notes Wound #1 (Lower Leg) Wound Laterality: Right, Medial Cleanser Soap and Water Discharge Instruction: May shower and wash wound with dial antibacterial soap and water prior to dressing change. Vashe 5.8 (oz) Discharge Instruction: Cleanse the wound with Vashe prior to applying a clean dressing using gauze sponges, not tissue or cotton balls. Peri-Wound Care Skin Prep Discharge Instruction: Use skin prep as directed Topical Primary  Dressing Hydrofera Blue Ready Transfer Foam, 4x5 (in/in) Discharge Instruction: Apply to wound bed as instructed Secondary Dressing Zetuvit Plus Silicone Border Dressing 7x7(in/in) Discharge Instruction: Apply silicone border over primary dressing as directed. Secured With Compression Wrap Compression Stockings Facilities manager) Signed: 11/24/2023 8:41:39 AM By: Dayton Scrape Entered By: Dayton Scrape on 11/24/2023 05:22:08 -------------------------------------------------------------------------------- Wound Assessment Details Patient Name: Date of Service: Shawn [name] 11/24/2023 8:00 A M Medical Record Number: [id number] Patient Account Number: [id number] Date of Birth/Sex: Treating RN: [birth year] (62 y.o. M) Primary Care Romana Deaton: Melissa Noon Other Clinician: Referring Dejean Tribby: Treating Kamrin Spath/Extender: Gentry Fitz in Treatment: 4 Wound Status Wound Number: 2 Primary Etiology: Venous Leg Ulcer Wound Location: Right, Proximal, Medial Lower Leg Wound Status: Open Wounding Event: Gradually Appeared Comorbid History: Anemia, Angina, Arrhythmia, Hypertension, Neuropathy Date Acquired: 10/27/2023 Weeks Of Treatment: 2 Clustered Wound: No Photos Ollie, Shawn [name] ([id number]) [id number] Page 9 of 12 Wound Measurements Length: (cm) 2.2 Width: (cm) 2.8 Depth: (cm) 0.1 Area: (cm) 4.838 Volume: (cm) 0.484 % Reduction in Area: -266.8% % Reduction in Volume: -266.7% Wound Description Classification: Full Thickness Without Exposed Support Exudate Amount: Medium Exudate Type: Serosanguineous Exudate Color: [name], brown Structures Periwound Skin Texture Texture Color No Abnormalities Noted: No No Abnormalities Noted: No Moisture No Abnormalities Noted: No Treatment Notes Wound #2 (Lower Leg) Wound Laterality: Right, Medial, Proximal Cleanser Soap and Water Discharge Instruction: May shower and wash wound  with dial antibacterial soap and water prior to dressing change. Vashe 5.8 (oz) Discharge Instruction: Cleanse the wound with Vashe prior to applying a clean dressing using gauze sponges, not tissue or cotton balls.  Peri-Wound Care Skin Prep Discharge Instruction: Use skin prep as directed Topical Primary Dressing Hydrofera Blue Ready Transfer Foam, 2.5x2.5 (in/in) Discharge Instruction: Apply directly to wound bed as directed Secondary Dressing Zetuvit Plus Silicone Border Dressing 7x7(in/in) Discharge Instruction: Apply silicone border over primary dressing as directed. Secured With Compression Wrap Compression Stockings Facilities manager) Signed: 11/24/2023 8:41:39 AM By: Dayton Scrape Entered By: Dayton Scrape on 11/24/2023 05:22:57 [name] ([id number]) [id number] Page 10 of 12 -------------------------------------------------------------------------------- Wound Assessment Details Patient Name: Date of Service: Shawn [name] 11/24/2023 8:00 A M Medical Record Number: [id number] Patient Account Number: [id number] Date of Birth/Sex: Treating RN: November 30, 1961 (62 y.o. M) Primary Care Haylee Mcanany: Melissa Noon Other Clinician: Referring Rickita Forstner: Treating Thaddaeus Granja/Extender: Gentry Fitz in Treatment: 4 Wound Status Wound Number: 3 Primary Etiology: Venous Leg Ulcer Wound Location: Right, Distal, Medial, Anterior Lower Leg Wound Status: Open Wounding Event: Gradually Appeared Comorbid History: Anemia, Angina, Arrhythmia, Hypertension, Neuropathy Date Acquired: 11/10/2023 Weeks Of Treatment: 2 Clustered Wound: No Photos Wound Measurements Length: (cm) 0.1 Width: (cm) 0.1 Depth: (cm) 0.1 Area: (cm) 0.008 Volume: (cm) 0.001 % Reduction in Area: 74.2% % Reduction in Volume: 66.7% Wound Description Classification: Full Thickness Without Exposed Support Exudate Amount: Medium Exudate Type:  Serosanguineous Exudate Color: [name], brown Structures Periwound Skin Texture Texture Color No Abnormalities Noted: No No Abnormalities Noted: No Moisture No Abnormalities Noted: No Treatment Notes Wound #3 (Lower Leg) Wound Laterality: Right, Medial, Anterior, Distal Cleanser Soap and Water Discharge Instruction: May shower and wash wound with dial antibacterial soap and water prior to dressing change. Vashe 5.8 (oz) Discharge Instruction: Cleanse the wound with Vashe prior to applying a clean dressing using gauze sponges, not tissue or cotton balls. Peri-Wound Care Skin Prep Discharge Instruction: Use skin prep as directed Topical Primary Dressing RAYANE, TEAS ([id number]) [id number] Page 11 of 12 Hydrofera Blue Ready Transfer Foam, 2.5x2.5 (in/in) Discharge Instruction: Apply directly to wound bed as directed Secondary Dressing Zetuvit Plus Silicone Border Dressing 7x7(in/in) Discharge Instruction: Apply silicone border over primary dressing as directed. Secured With Compression Wrap Compression Stockings Facilities manager) Signed: 11/24/2023 8:41:39 AM By: Dayton Scrape Entered By: Dayton Scrape on 11/24/2023 05:23:22 -------------------------------------------------------------------------------- Wound Assessment Details Patient Name: Date of Service: Shawn [name] 11/24/2023 8:00 A M Medical Record Number: [id number] Patient Account Number: [id number] Date of Birth/Sex: Treating RN: July 26, 1961 (62 y.o. M) Primary Care Aster Screws: Melissa Noon Other Clinician: Referring Dalayza Zambrana: Treating Jaylon Boylen/Extender: Gentry Fitz in Treatment: 4 Wound Status Wound Number: 4 Primary Etiology: Venous Leg Ulcer Wound Location: Right, Distal, Medial, Posterior Lower Leg Wound Status: Open Wounding Event: Gradually Appeared Comorbid History: Anemia, Angina, Arrhythmia, Hypertension, Neuropathy Date Acquired:  11/10/2023 Weeks Of Treatment: 2 Clustered Wound: No Photos Wound Measurements Length: (cm) 1 Width: (cm) 1.5 Depth: (cm) 0.1 Area: (cm) 1.178 Volume: (cm) 0.118 % Reduction in Area: -1153.2% % Reduction in Volume: -1211.1% Wound Description Classification: Full Thickness Without Exposed Support Exudate Amount: Medium Exudate Type: Serosanguineous Exudate Color: [name], brown Structures Periwound Skin Texture Texture Color No Abnormalities Noted: No No Abnormalities Noted: No [address] TAY, HUWE ([id number]) [id number] Page 12 of 12 No Abnormalities Noted: No Treatment Notes Wound #4 (Lower Leg) Wound Laterality: Right, Medial, Posterior, Distal Cleanser Soap and Water Discharge Instruction: May shower and wash wound with dial antibacterial soap and water prior to dressing change. Vashe 5.8 (oz) Discharge Instruction: Cleanse the wound with Vashe prior to applying a clean dressing using gauze  sponges, not tissue or cotton balls. Peri-Wound Care Skin Prep Discharge Instruction: Use skin prep as directed Topical Primary Dressing Hydrofera Blue Ready Transfer Foam, 2.5x2.5 (in/in) Discharge Instruction: Apply directly to wound bed as directed Secondary Dressing Zetuvit Plus Silicone Border Dressing 7x7(in/in) Discharge Instruction: Apply silicone border over primary dressing as directed. Secured With Compression Wrap Compression Stockings Facilities manager) Signed: 11/24/2023 8:41:39 AM By: Dayton Scrape Entered By: Dayton Scrape on 11/24/2023 05:23:51 -------------------------------------------------------------------------------- Vitals Details Patient Name: Date of Service: Shawn [name], Shawn [name] 11/24/2023 8:00 A M Medical Record Number: [id number] Patient Account Number: [id number] Date of Birth/Sex: Treating RN: May 27, 1961 (62 y.o. M) Primary Care Kerry-Anne Mezo: Melissa Noon Other Clinician: Referring Nechemia Chiappetta: Treating  Ajani Rineer/Extender: Gentry Fitz in Treatment: 4 Vital Signs Time Taken: 08:02 Temperature (F): 97.9 Height (in): 70 Pulse (bpm): 94 Weight (lbs): 204 Respiratory Rate (breaths/min): 20 Body Mass Index (BMI): 29.3 Blood Pressure (mmHg): 122/81 Reference Range: 80 - 120 mg / dl Electronic Signature(s) Signed: 11/24/2023 8:41:39 AM By: Dayton Scrape Entered By: Dayton Scrape on 11/24/2023 05:03:12

## 2023-12-02 ENCOUNTER — Other Ambulatory Visit: Payer: Self-pay | Admitting: *Deleted

## 2023-12-02 DIAGNOSIS — N5201 Erectile dysfunction due to arterial insufficiency: Secondary | ICD-10-CM

## 2023-12-02 DIAGNOSIS — E349 Endocrine disorder, unspecified: Secondary | ICD-10-CM

## 2023-12-03 ENCOUNTER — Other Ambulatory Visit: Payer: Self-pay | Admitting: Urology

## 2023-12-04 ENCOUNTER — Other Ambulatory Visit: Payer: PRIVATE HEALTH INSURANCE

## 2023-12-04 DIAGNOSIS — N5201 Erectile dysfunction due to arterial insufficiency: Secondary | ICD-10-CM

## 2023-12-04 DIAGNOSIS — E349 Endocrine disorder, unspecified: Secondary | ICD-10-CM

## 2023-12-05 LAB — HEMOGLOBIN AND HEMATOCRIT, BLOOD
Hematocrit: 37.7 % (ref 37.5–51.0)
Hemoglobin: 12.4 g/dL — ABNORMAL LOW (ref 13.0–17.7)

## 2023-12-05 LAB — TESTOSTERONE: Testosterone: 788 ng/dL (ref 264–916)

## 2023-12-08 ENCOUNTER — Ambulatory Visit (HOSPITAL_BASED_OUTPATIENT_CLINIC_OR_DEPARTMENT_OTHER): Payer: BC Managed Care – PPO | Admitting: General Surgery

## 2023-12-23 ENCOUNTER — Other Ambulatory Visit: Payer: Self-pay | Admitting: *Deleted

## 2023-12-24 ENCOUNTER — Encounter (HOSPITAL_BASED_OUTPATIENT_CLINIC_OR_DEPARTMENT_OTHER): Payer: PRIVATE HEALTH INSURANCE | Attending: General Surgery | Admitting: General Surgery

## 2023-12-24 DIAGNOSIS — M3130 Wegener's granulomatosis without renal involvement: Secondary | ICD-10-CM | POA: Diagnosis not present

## 2023-12-24 DIAGNOSIS — L97812 Non-pressure chronic ulcer of other part of right lower leg with fat layer exposed: Secondary | ICD-10-CM | POA: Diagnosis present

## 2023-12-24 DIAGNOSIS — I872 Venous insufficiency (chronic) (peripheral): Secondary | ICD-10-CM | POA: Diagnosis not present

## 2023-12-24 MED ORDER — TESTOSTERONE CYPIONATE 200 MG/ML IM SOLN: 300.0000 mg | INTRAMUSCULAR | 0 refills | Status: DC

## 2023-12-24 NOTE — Progress Notes (Signed)
 [place], ARLEY ([id number]) [id number] Page 1 of 10 Visit Report for 12/24/2023 Chief Complaint Document Details Patient Name: Date of Service: Shawn [name] Shawn [name] Shawn [name] 12/24/2023 7:30 A M Medical Record Number: [id number] Patient Account Number: [id number] Date of Birth/Sex: Treating RN: 02/11/1961 (63 y.o. M) Primary Care Provider: ARMOND OLEGARIO FRIEZE Other Clinician: Referring Provider: Treating Provider/Extender: Marolyn Delon Meredeth Prentice Devra in Treatment: 9 Information Obtained from: Patient Chief Complaint Patient presents to the wound care center with open non-healing surgical wound(s) in the setting of venous insufficiency Electronic Signature(s) Signed: 12/24/2023 8:31:07 AM By: Marolyn Delon MD FACS Entered By: Marolyn Delon on 12/24/2023 05:31:07 -------------------------------------------------------------------------------- Debridement Details Patient Name: Date of Service: Shawn [name] Shawn [name], Shawn [name] 12/24/2023 7:30 A M Medical Record Number: [id number] Patient Account Number: [id number] Date of Birth/Sex: Treating RN: 03-06-1961 (63 y.o. NETTY Merleen Handing Primary Care Provider: ARMOND OLEGARIO FRIEZE Other Clinician: Referring Provider: Treating Provider/Extender: Marolyn Delon Meredeth Prentice Devra in Treatment: 9 Debridement Performed for Assessment: Wound #2 Right,Proximal,Medial Lower Leg Performed By: Physician Marolyn Delon, MD The following information was scribed by: Merleen Handing The information was scribed for: Marolyn Delon Debridement Type: Debridement Severity of Tissue Pre Debridement: Fat layer exposed Level of Consciousness (Pre-procedure): Awake and Alert Pre-procedure Verification/Time Out Yes - 08:10 Taken: Start Time: 08:11 Pain Control: Lidocaine  4% T opical Solution Percent of Wound Bed Debrided: 100% T Area Debrided (cm): otal 3.77 Tissue and other material debrided: Viable, Non-Viable, Slough, Subcutaneous, Slough Level:  Skin/Subcutaneous Tissue Debridement Description: Excisional Instrument: Curette Bleeding: Minimum Hemostasis Achieved: Pressure Procedural Pain: 3 Post Procedural Pain: 2 Response to Treatment: Procedure was tolerated well Level of Consciousness (Post- Awake and Alert procedure): Post Debridement Measurements of Total Wound Length: (cm) 3.2 Width: (cm) 1.5 Depth: (cm) 0.1 Volume: (cm) 0.377 LENON ARLEY ([id number]) [id number] Page 2 of 10 Character of Wound/Ulcer Post Debridement: Improved Severity of Tissue Post Debridement: Fat layer exposed Post Procedure Diagnosis Same as Pre-procedure Electronic Signature(s) Signed: 12/24/2023 9:02:26 AM By: Marolyn Delon MD FACS Signed: 12/24/2023 4:46:16 PM By: Merleen Handing RN, BSN Entered By: Boehlein, Linda on 12/24/2023 05:13:10 -------------------------------------------------------------------------------- Debridement Details Patient Name: Date of Service: Shawn [name] Shawn [name], Shawn [name] 12/24/2023 7:30 A M Medical Record Number: [id number] Patient Account Number: [id number] Date of Birth/Sex: Treating RN: 18-May-1961 (63 y.o. NETTY Merleen Handing Primary Care Provider: ARMOND OLEGARIO FRIEZE Other Clinician: Referring Provider: Treating Provider/Extender: Marolyn Delon Meredeth Prentice Devra in Treatment: 9 Debridement Performed for Assessment: Wound #1 Right,Medial Lower Leg Performed By: Physician Marolyn Delon, MD Debridement Type: Debridement Severity of Tissue Pre Debridement: Fat layer exposed Level of Consciousness (Pre-procedure): Awake and Alert Pre-procedure Verification/Time Out Yes - 08:10 Taken: Start Time: 08:11 Pain Control: Lidocaine  4% T opical Solution Percent of Wound Bed Debrided: 100% T Area Debrided (cm): otal 0.71 Tissue and other material debrided: Viable, Non-Viable, Slough, Subcutaneous, Slough Level: Skin/Subcutaneous Tissue Debridement Description: Excisional Instrument:  Curette Bleeding: Minimum Hemostasis Achieved: Pressure Procedural Pain: 3 Post Procedural Pain: 2 Response to Treatment: Procedure was tolerated well Level of Consciousness (Post- Awake and Alert procedure): Post Debridement Measurements of Total Wound Length: (cm) 0.7 Width: (cm) 1.3 Depth: (cm) 0.1 Volume: (cm) 0.071 Character of Wound/Ulcer Post Debridement: Improved Severity of Tissue Post Debridement: Fat layer exposed Post Procedure Diagnosis Same as Pre-procedure Electronic Signature(s) Signed: 12/24/2023 9:02:26 AM By: Marolyn Delon MD FACS Signed: 12/24/2023 4:46:16 PM By: Merleen Handing RN, BSN Entered By: Merleen Handing on 12/24/2023 05:13:41 LENON ARLEY ([id number]) [id number] Page 3 of 10 -------------------------------------------------------------------------------- Debridement  Details Patient Name: Date of Service: Shawn [name] Shawn [name] Shawn [name] 12/24/2023 7:30 A M Medical Record Number: [id number] Patient Account Number: [id number] Date of Birth/Sex: Treating RN: Apr 25, 1961 (63 y.o. NETTY Merleen Handing Primary Care Provider: ARMOND OLEGARIO FRIEZE Other Clinician: Referring Provider: Treating Provider/Extender: Marolyn Delon Meredeth Prentice Devra in Treatment: 9 Debridement Performed for Assessment: Wound #4 Right,Distal,Medial,Posterior Lower Leg Performed By: Physician Marolyn Delon, MD Debridement Type: Debridement Severity of Tissue Pre Debridement: Fat layer exposed Level of Consciousness (Pre-procedure): Awake and Alert Pre-procedure Verification/Time Out Yes - 08:10 Taken: Start Time: 08:11 Pain Control: Lidocaine  4% T opical Solution Percent of Wound Bed Debrided: 100% T Area Debrided (cm): otal 0.27 Tissue and other material debrided: Viable, Non-Viable, Slough, Subcutaneous, Slough Level: Skin/Subcutaneous Tissue Debridement Description: Excisional Instrument: Curette Bleeding: Minimum Hemostasis Achieved: Pressure Procedural  Pain: 3 Post Procedural Pain: 2 Response to Treatment: Procedure was tolerated well Level of Consciousness (Post- Awake and Alert procedure): Post Debridement Measurements of Total Wound Length: (cm) 0.5 Width: (cm) 0.7 Depth: (cm) 0.1 Volume: (cm) 0.027 Character of Wound/Ulcer Post Debridement: Improved Severity of Tissue Post Debridement: Fat layer exposed Post Procedure Diagnosis Same as Pre-procedure Electronic Signature(s) Signed: 12/24/2023 9:02:26 AM By: Marolyn Delon MD FACS Signed: 12/24/2023 4:46:16 PM By: Merleen Handing RN, BSN Entered By: Merleen Handing on 12/24/2023 05:14:18 -------------------------------------------------------------------------------- HPI Details Patient Name: Date of Service: Shawn [name] Shawn [name], Shawn [name] 12/24/2023 7:30 A M Medical Record Number: [id number] Patient Account Number: [id number] Date of Birth/Sex: Treating RN: [birth year] (63 y.o. M) Primary Care Provider: ARMOND OLEGARIO FRIEZE Other Clinician: Referring Provider: Treating Provider/Extender: Marolyn Delon Meredeth Prentice Devra in Treatment: 9 History of Present Illness HPI Description: ADMISSION 10/22/2023 ***ABIs R: 1.19*** This is a 63 year old nondiabetic presenting to clinic with an ulcer on his right lower leg. He had a skin biopsy performed in this location to try to determine the etiology of a rash and the wound failed to heal. He does have venous reflux based upon study performed in July of this year. He was seen by vascular surgery after the study was performed. Reading the vascular surgery note, the rash appeared typical of vasculitis to the provider. He did not think treating the chronic venous insufficiency would make his rash go away as compression and elevation had actually exacerbated the rash. The patient was encouraged to get a second opinion from a rheumatologist at an academic center and nothing further was done. He has his appointment with an [facility] provider tomorrow. He  saw his PCP on September 29, 2023 and was given a 10-day course of Bactrim. He was also referred to the wound care center at that time. [place], ARLEY ([id number]) [id number] Page 4 of 10 10/28/2023: His wound is actually measuring a little bit larger today. There is a leathery surface and fibrotic texture underneath. 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 Additional serologic testing is pending. He has been started on methotrexate and folic acid. He will be undergoing back surgery next week and has been told that he cannot travel in a car for at least 4 weeks after his procedure. 11/10/2023: After his back surgery, he required a 4-day hospital stay due to postoperative orthostatic hypotension. He says that his wounds were not cared for during that time and today, he has several new open wounds adjacent to the initial existing wound. There is thick slough accumulation on all of the surfaces. 12/10; this is a patient who  has bilateral lower extremity vasculitis. 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 He is on six 2.5 mg tablets of methotrexate and folic acid for the last 4 weeks. His follow-up with rheumatology is not until February In terms of other symptoms he says he is had bleeding from both nostrils and he shows pictures of large clumps of dark brown material that comes out of his nose every morning. He is also having pain on the roof of his mouth and the soft palate. He describes episodic neuropathic pain in various parts of his body which at times is excruciating. His wounds are located to the right medial lower leg there is for open areas. Some necrotic areas are forming no doubt skin breakdown from occlusion. We have been putting Hydrofera Blue in these wounds which his wife says helps He has  had back surgery as well and apparently some wound dehiscence although he has been followed for this by orthopedic surgery. I have not looked at this today. 12/24/2023: His wounds are all measuring smaller. There is quite a bit of epithelialization occurring. There is a little bit of slough on the surface but the underlying granulation tissue is healthy. He did follow-up with his rheumatologist and she wanted to give the methotrexate a full 12 weeks to work before changing any of his medications. Electronic Signature(s) Signed: 12/24/2023 8:32:11 AM By: Marolyn Nest MD FACS Entered By: Marolyn Nest on 12/24/2023 05:32:11 -------------------------------------------------------------------------------- Physical Exam Details Patient Name: Date of Service: Shawn [name] Shawn [name] Shawn [name] 12/24/2023 7:30 A M Medical Record Number: [id number] Patient Account Number: [id number] Date of Birth/Sex: Treating RN: July 28, 1961 (63 y.o. M) Primary Care Provider: ARMOND OLEGARIO FRIEZE Other Clinician: Referring Provider: Treating Provider/Extender: Marolyn Nest Meredeth Prentice Devra in Treatment: 9 Constitutional . . . . no acute distress. Respiratory Normal work of breathing on room air.. Notes 12/24/2023: His wounds are all measuring smaller. There is quite a bit of epithelialization occurring. There is a little bit of slough on the surface but the underlying granulation tissue is healthy. Electronic Signature(s) Signed: 12/24/2023 8:32:46 AM By: Marolyn Nest MD FACS Entered By: Marolyn Nest on 12/24/2023 05:32:46 -------------------------------------------------------------------------------- Physician Orders Details Patient Name: Date of Service: Shawn [name] Shawn [name], Shawn [name] 12/24/2023 7:30 A M Medical Record Number: [id number] Patient Account Number: [id number] Date of Birth/Sex: Treating RN: Apr 27, 1961 (63 y.o. NETTY Merleen Handing Primary Care Provider: ARMOND OLEGARIO FRIEZE Other Clinician: Referring Provider: Treating  Provider/Extender: Marolyn Nest Meredeth Prentice Devra in Treatment: [address] [place] ([id number]) [id number] Page 5 of 10 The following information was scribed by: Merleen Handing The information was scribed for: Marolyn Nest Verbal / Phone Orders: No Diagnosis Coding ICD-10 Coding Code Description [phone] Non-pressure chronic ulcer of other part of right lower leg with fat layer exposed I87.2 Venous insufficiency (chronic) (peripheral) 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 - w/ Dr. Marolyn Anesthetic (In clinic) Topical Lidocaine  5% applied to wound bed - prior to debridement (In clinic) Topical Lidocaine  4% applied to wound bed Bathing/ Shower/ Hygiene May shower and wash wound with soap and water. Edema Control - Orders / Instructions Bilateral Lower Extremities Segmental Compressive Device. Use the Segmental Compressive Device on leg(s) 2-3 times a day for 45 - 60 minutes. If wearing any wraps or hose, do not remove them. Continue exercising as instructed. Elevate legs to the level of the heart or above for 30 minutes  daily and/or when sitting for 3-4 times a day throughout the day. Avoid standing for long periods of time. Wound Treatment Wound #1 - Lower Leg Wound Laterality: Right, Medial Cleanser: Soap and Water 1 x Per Day/30 Days Discharge Instructions: May shower and wash wound with dial antibacterial soap and water prior to dressing change. Cleanser: Vashe 5.8 (oz) 1 x Per Day/30 Days Discharge Instructions: Cleanse the wound with Vashe prior to applying a clean dressing using gauze sponges, not tissue or cotton balls. Peri-Wound Care: Skin Prep (Generic) 1 x Per Day/30 Days Discharge Instructions: Use skin prep as directed Prim Dressing: Hydrofera Blue Ready Transfer Foam, 4x5 (in/in) (Generic) 1 x Per Day/30 Days ary Discharge Instructions: Apply to wound bed as  instructed Secondary Dressing: Zetuvit Plus Silicone Border Dressing 7x7(in/in) (Generic) 1 x Per Day/30 Days Discharge Instructions: Apply silicone border over primary dressing as directed. Wound #2 - Lower Leg Wound Laterality: Right, Medial, Proximal Cleanser: Soap and Water 1 x Per Day/30 Days Discharge Instructions: May shower and wash wound with dial antibacterial soap and water prior to dressing change. Cleanser: Vashe 5.8 (oz) 1 x Per Day/30 Days Discharge Instructions: Cleanse the wound with Vashe prior to applying a clean dressing using gauze sponges, not tissue or cotton balls. Peri-Wound Care: Skin Prep (Generic) 1 x Per Day/30 Days Discharge Instructions: Use skin prep as directed Prim Dressing: Hydrofera Blue Ready Transfer Foam, 2.5x2.5 (in/in) (Generic) 1 x Per Day/30 Days ary Discharge Instructions: Apply directly to wound bed as directed Secondary Dressing: Zetuvit Plus Silicone Border Dressing 7x7(in/in) (Generic) 1 x Per Day/30 Days Discharge Instructions: Apply silicone border over primary dressing as directed. Wound #3 - Lower Leg Wound Laterality: Right, Medial, Anterior, Distal Cleanser: Soap and Water Every Other Day/30 Days Discharge Instructions: May shower and wash wound with dial antibacterial soap and water prior to dressing change. Cleanser: Vashe 5.8 (oz) Every Other Day/30 Days Discharge Instructions: Cleanse the wound with Vashe prior to applying a clean dressing using gauze sponges, not tissue or cotton balls. Peri-Wound Care: Skin Prep (Generic) Every Other Day/30 Days Discharge Instructions: Use skin prep as directed Prim Dressing: Hydrofera Blue Ready Transfer Foam, 2.5x2.5 (in/in) (Generic) Every Other Day/30 Days ary Discharge Instructions: Apply directly to wound bed as directed Secondary Dressing: Zetuvit Plus Silicone Border Dressing 7x7(in/in) (Generic) Every Other Day/30 Days VERNIS, EID ([id number]) [phone] Page  6 of 10 Discharge Instructions: Apply silicone border over primary dressing as directed. Wound #4 - Lower Leg Wound Laterality: Right, Medial, Posterior, Distal Cleanser: Soap and Water Every Other Day/30 Days Discharge Instructions: May shower and wash wound with dial antibacterial soap and water prior to dressing change. Cleanser: Vashe 5.8 (oz) Every Other Day/30 Days Discharge Instructions: Cleanse the wound with Vashe prior to applying a clean dressing using gauze sponges, not tissue or cotton balls. Peri-Wound Care: Skin Prep (Generic) Every Other Day/30 Days Discharge Instructions: Use skin prep as directed Prim Dressing: Hydrofera Blue Ready Transfer Foam, 2.5x2.5 (in/in) (Generic) Every Other Day/30 Days ary Discharge Instructions: Apply directly to wound bed as directed Secondary Dressing: Zetuvit Plus Silicone Border Dressing 7x7(in/in) (Generic) Every Other Day/30 Days Discharge Instructions: Apply silicone border over primary dressing as directed. Electronic Signature(s) Signed: 12/24/2023 9:02:26 AM By: Marolyn Nest MD FACS Entered By: Marolyn Nest on 12/24/2023 05:33:19 -------------------------------------------------------------------------------- Problem List Details Patient Name: Date of Service: Shawn [name] SAILOR, Shawn [name] 12/24/2023 7:30 A M Medical Record Number: [id number] Patient Account Number: [id number] Date of Birth/Sex: Treating RN: [birth year] [phone]  y.o. 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 M) Primary Care Provider: ARMOND OLEGARIO FRIEZE Other Clinician: Referring Provider: Treating Provider/Extender: Marolyn Delon Meredeth Prentice Devra in Treatment: 9 Subjective Chief Complaint Information obtained from Patient Patient presents to the wound care center with open non-healing surgical wound(s) in the setting of venous insufficiency History of Present Illness (HPI) ADMISSION 10/22/2023 ***ABIs R: 1.19*** This is a 63 year old nondiabetic presenting to clinic with an ulcer on his right lower leg. He had a skin biopsy performed in this location to try to determine the etiology of a rash and the wound failed to heal. He does have venous reflux based upon study performed in July of this year. He was seen by vascular surgery after the study was performed. Reading the vascular surgery note, the rash appeared typical of vasculitis to the provider. He did not think treating the chronic venous insufficiency would make his rash go away as compression and elevation had actually exacerbated the rash. The patient was encouraged to get a second opinion from a rheumatologist at an academic center and nothing further was done. He has his appointment with an [facility] provider tomorrow. He saw his PCP on September 29, 2023 and was given a 10-day course of Bactrim. He was also referred to the wound care center at that time. 10/28/2023: His wound is actually measuring a  little bit larger today. There is a leathery surface and fibrotic texture underneath. 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 Additional serologic testing is pending. He has been started on methotrexate and folic acid. He will be undergoing back surgery next week and has been told that he cannot travel in a car for at least 4 weeks after his procedure. 11/10/2023: After his back surgery, he required a 4-day hospital stay due to postoperative orthostatic hypotension. He says that his wounds were not cared for during that time and today, he has several new open wounds adjacent to the initial existing wound. There is thick slough accumulation on all of the surfaces. 12/10; this is a patient who has bilateral lower extremity vasculitis. 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 He is on six 2.5 mg tablets of methotrexate and folic acid for the last 4 weeks. His follow-up with rheumatology is not until February In terms of other symptoms he says he is had bleeding from both nostrils and he shows pictures of large clumps of dark brown material that comes out of his nose every morning. He is also having pain on the roof of his mouth and the soft palate. He describes episodic neuropathic pain in various parts of his body which  at times is excruciating. His wounds are located to the right medial lower leg there is for open areas. Some necrotic areas are forming no doubt skin breakdown from occlusion. We have been putting Hydrofera Blue in these wounds which his wife says helps He has had back surgery as well and apparently some wound dehiscence although he has been followed for this by orthopedic surgery. I have not looked at this today. 12/24/2023: His wounds are all measuring smaller. There is quite a bit of epithelialization occurring.  There is a little bit of slough on the surface but the underlying granulation tissue is healthy. He did follow-up with his rheumatologist and she wanted to give the methotrexate a full 12 weeks to work before changing any of his medications. Objective Constitutional no acute distress. Vitals Time Taken: 7:49 AM, Height: 70 in, Weight: 204 lbs, BMI: 29.3, Temperature: 98.2 F, Pulse: 93 bpm, Respiratory Rate: 18 breaths/min, Blood Pressure: 132/81 mmHg. Respiratory Normal work of breathing on room air.. General Notes: 12/24/2023: His wounds are all measuring smaller. There is quite a bit of epithelialization occurring. There is a little bit of slough on the surface but the underlying granulation tissue is healthy. Integumentary (Hair, Skin) Wound #1 status is Open. Original cause of wound was Puncture. The date acquired was: 07/29/2023. The wound has been in treatment 9 weeks. The wound is located on the Right,Medial Lower Leg. The wound measures 0.7cm length x 1.3cm width x 0.1cm depth; 0.715cm^2 area and 0.071cm^3 volume. There is Fat DELQUAN, POUCHER ([id number]) [id number] Page 8 of 10 Layer (Subcutaneous Tissue) exposed. There is no tunneling or undermining noted. There is a medium amount of serosanguineous drainage noted. The wound margin is flat and intact. There is large (67-100%) red granulation within the wound bed. There is a small (1-33%) amount of necrotic tissue within the wound bed including Adherent Slough. The periwound skin appearance had no abnormalities noted for texture. The periwound skin appearance had no abnormalities noted for moisture. The periwound skin appearance exhibited: Hemosiderin Staining. Periwound temperature was noted as No Abnormality. The periwound has tenderness on palpation. Wound #2 status is Open. Original cause of wound was Gradually Appeared. The date acquired was: 10/27/2023. The wound has been in treatment 6 weeks. The wound is  located on the Right,Proximal,Medial Lower Leg. The wound measures 3.2cm length x 1.5cm width x 0.1cm depth; 3.77cm^2 area and 0.377cm^3 volume. There is Fat Layer (Subcutaneous Tissue) exposed. There is no tunneling or undermining noted. There is a medium amount of serosanguineous drainage noted. The wound margin is flat and intact. There is large (67-100%) red granulation within the wound bed. There is no necrotic tissue within the wound bed. The periwound skin appearance had no abnormalities noted for texture. The periwound skin appearance had no abnormalities noted for moisture. The periwound skin appearance exhibited: Hemosiderin Staining. Periwound temperature was noted as No Abnormality. The periwound has tenderness on palpation. Wound #3 status is Open. Original cause of wound was Gradually Appeared. The date acquired was: 11/10/2023. The wound has been in treatment 6 weeks. The wound is located on the Right,Distal,Medial,Anterior Lower Leg. The wound measures 0.1cm length x 0.1cm width x 0.1cm depth; 0.008cm^2 area and 0.001cm^3 volume. There is no tunneling or undermining noted. There is a small amount of serosanguineous drainage noted. The wound margin is flat and intact. There is no granulation within the wound bed. There is no necrotic tissue within the wound bed. The periwound skin appearance had no abnormalities noted for  moisture. The periwound skin appearance exhibited: Hemosiderin Staining. Periwound temperature was noted as No Abnormality. The periwound has tenderness on palpation. Wound #4 status is Open. Original cause of wound was Gradually Appeared. The date acquired was: 11/10/2023. The wound has been in treatment 6 weeks. The wound is located on the Right,Distal,Medial,Posterior Lower Leg. The wound measures 0.5cm length x 0.7cm width x 0.1cm depth; 0.275cm^2 area and 0.027cm^3 volume. There is Fat Layer (Subcutaneous Tissue) exposed. There is no tunneling or undermining noted.  There is a medium amount of serosanguineous drainage noted. The wound margin is flat and intact. There is large (67-100%) red granulation within the wound bed. There is no necrotic tissue within the wound bed. The periwound skin appearance had no abnormalities noted for texture. The periwound skin appearance exhibited: Hemosiderin Staining. Periwound temperature was noted as No Abnormality. The periwound has tenderness on palpation. 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 There was a Excisional Skin/Subcutaneous Tissue Debridement with a total area of 0.71 sq cm performed by Marolyn Nest, MD. With the following instrument(s): Curette to remove Viable and Non-Viable tissue/material. Material removed includes Subcutaneous Tissue and Slough and after achieving pain control using Lidocaine  4% T opical Solution. No specimens were taken. A time out was conducted at 08:10, prior to the start of the procedure. A Minimum amount of bleeding was controlled with Pressure. The procedure was tolerated well with a pain level of 3 throughout and a pain level of 2 following the procedure. Post Debridement Measurements: 0.7cm length x 1.3cm width x 0.1cm depth; 0.071cm^3 volume. Character of Wound/Ulcer Post Debridement is improved. Severity of Tissue Post Debridement is: Fat layer exposed. Post procedure Diagnosis Wound #1: Same as Pre-Procedure Wound #2 Pre-procedure diagnosis of Wound #2 is a Venous Leg Ulcer located on the Right,Proximal,Medial Lower Leg .Severity of Tissue Pre Debridement is: Fat layer exposed. There was a Excisional Skin/Subcutaneous Tissue  Debridement with a total area of 3.77 sq cm performed by Marolyn Nest, MD. With the following instrument(s): Curette to remove Viable and Non-Viable tissue/material. Material removed includes Subcutaneous Tissue and Slough and after achieving pain control using Lidocaine  4% Topical Solution. No specimens were taken. A time out was conducted at 08:10, prior to the start of the procedure. A Minimum amount of bleeding was controlled with Pressure. The procedure was tolerated well with a pain level of 3 throughout and a pain level of 2 following the procedure. Post Debridement Measurements: 3.2cm length x 1.5cm width x 0.1cm depth; 0.377cm^3 volume. Character of Wound/Ulcer Post Debridement is improved. Severity of Tissue Post Debridement is: Fat layer exposed. Post procedure Diagnosis Wound #2: Same as Pre-Procedure Wound #4 Pre-procedure diagnosis of Wound #4 is a Venous Leg Ulcer located on the Right,Distal,Medial,Posterior Lower Leg .Severity of Tissue Pre Debridement is: Fat layer exposed. There was a Excisional Skin/Subcutaneous Tissue Debridement with a total area of 0.27 sq cm performed by Marolyn Nest, MD. With the following instrument(s): Curette to remove Viable and Non-Viable tissue/material. Material removed includes Subcutaneous Tissue and Slough and after achieving pain control using Lidocaine  4% Topical Solution. No specimens were taken. A time out was conducted at 08:10, prior to the start of the procedure. A Minimum amount of bleeding was  controlled with Pressure. The procedure was tolerated well with a pain level of 3 throughout and a pain level of 2 following the procedure. Post Debridement Measurements: 0.5cm length x 0.7cm width x 0.1cm depth; 0.027cm^3 volume. Character of Wound/Ulcer Post Debridement is improved. Severity of Tissue Post Debridement is: Fat layer exposed. Post procedure Diagnosis Wound #4: Same as Pre-Procedure Plan Follow-up Appointments: Return  appointment in 1 month. - w/ Dr. Marolyn Anesthetic: LENON KUBA ([id number]) [id number] Page 9 of 10 (In clinic) Topical Lidocaine  5% applied to wound bed - prior to debridement (In clinic) Topical Lidocaine  4% applied to wound bed Bathing/ Shower/ Hygiene: May shower and wash wound with soap and water. Edema Control - Orders / Instructions: Segmental Compressive Device. Use the Segmental Compressive Device on leg(s) 2-3 times a day for 45 - 60 minutes. If wearing any wraps or hose, do not remove them. Continue exercising as instructed. Elevate legs to the level of the heart or above for 30 minutes daily and/or when sitting for 3-4 times a day throughout the day. Avoid standing for long periods of time. WOUND #1: - Lower Leg Wound Laterality: Right, Medial Cleanser: Soap and Water 1 x Per Day/30 Days Discharge Instructions: May shower and wash wound with dial antibacterial soap and water prior to dressing change. Cleanser: Vashe 5.8 (oz) 1 x Per Day/30 Days Discharge Instructions: Cleanse the wound with Vashe prior to applying a clean dressing using gauze sponges, not tissue or cotton balls. Peri-Wound Care: Skin Prep (Generic) 1 x Per Day/30 Days Discharge Instructions: Use skin prep as directed Prim Dressing: Hydrofera Blue Ready Transfer Foam, 4x5 (in/in) (Generic) 1 x Per Day/30 Days ary Discharge Instructions: Apply to wound bed as instructed Secondary Dressing: Zetuvit Plus Silicone Border Dressing 7x7(in/in) (Generic) 1 x Per Day/30 Days Discharge Instructions: Apply silicone border over primary dressing as directed. WOUND #2: - Lower Leg Wound Laterality: Right, Medial, Proximal Cleanser: Soap and Water 1 x Per Day/30 Days Discharge Instructions: May shower and wash wound with dial antibacterial soap and water prior to dressing change. Cleanser: Vashe 5.8 (oz) 1 x Per Day/30 Days Discharge Instructions: Cleanse the wound with Vashe prior to applying  a clean dressing using gauze sponges, not tissue or cotton balls. Peri-Wound Care: Skin Prep (Generic) 1 x Per Day/30 Days Discharge Instructions: Use skin prep as directed Prim Dressing: Hydrofera Blue Ready Transfer Foam, 2.5x2.5 (in/in) (Generic) 1 x Per Day/30 Days ary Discharge Instructions: Apply directly to wound bed as directed Secondary Dressing: Zetuvit Plus Silicone Border Dressing 7x7(in/in) (Generic) 1 x Per Day/30 Days Discharge Instructions: Apply silicone border over primary dressing as directed. WOUND #3: - Lower Leg Wound Laterality: Right, Medial, Anterior, Distal Cleanser: Soap and Water Every Other Day/30 Days Discharge Instructions: May shower and wash wound with dial antibacterial soap and water prior to dressing change. Cleanser: Vashe 5.8 (oz) Every Other Day/30 Days Discharge Instructions: Cleanse the wound with Vashe prior to applying a clean dressing using gauze sponges, not tissue or cotton balls. Peri-Wound Care: Skin Prep (Generic) Every Other Day/30 Days Discharge Instructions: Use skin prep as directed Prim Dressing: Hydrofera Blue Ready Transfer Foam, 2.5x2.5 (in/in) (Generic) Every Other Day/30 Days ary Discharge Instructions: Apply directly to wound bed as directed Secondary Dressing: Zetuvit Plus Silicone Border Dressing 7x7(in/in) (Generic) Every Other Day/30 Days Discharge Instructions: Apply silicone border over primary dressing as directed. WOUND #4: - Lower Leg Wound Laterality: Right, Medial, Posterior, Distal Cleanser: Soap and Water Every Other Day/30 Days Discharge Instructions:  May shower and wash wound with dial antibacterial soap and water prior to dressing change. Cleanser: Vashe 5.8 (oz) Every Other Day/30 Days Discharge Instructions: Cleanse the wound with Vashe prior to applying a clean dressing using gauze sponges, not tissue or cotton balls. Peri-Wound Care: Skin Prep (Generic) Every Other Day/30 Days Discharge Instructions: Use skin  prep as directed Prim Dressing: Hydrofera Blue Ready Transfer Foam, 2.5x2.5 (in/in) (Generic) Every Other Day/30 Days ary Discharge Instructions: Apply directly to wound bed as directed Secondary Dressing: Zetuvit Plus Silicone Border Dressing 7x7(in/in) (Generic) Every Other Day/30 Days Discharge Instructions: Apply silicone border over primary dressing as directed. 12/24/2023: His wounds are all measuring smaller. There is quite a bit of epithelialization occurring. There is a little bit of slough on the surface but the underlying granulation tissue is healthy. He did follow-up with his rheumatologist and she wanted to give the methotrexate a full 12 weeks to work before changing any of his medications. I used a curette to debride slough and subcutaneous tissue from each of the open wound areas. He seems to be doing nicely with the Hydrofera Blue so we will continue this. I agree with his rheumatologist, that giving the methotrexate ample time to work is appropriate. We are already seeing good effects in his wounds, however. He will follow-up in 1 month. Electronic Signature(s) Signed: 12/24/2023 8:34:30 AM By: Marolyn Nest MD FACS Entered By: Marolyn Nest on 12/24/2023 05:34:30 -------------------------------------------------------------------------------- SuperBill Details Patient Name: Date of Service: Shawn [name] [place], [place] [name] 12/24/2023 Medical Record Number: [id number] Patient Account Number: [id number] Date of Birth/Sex: Treating RN: 07-18-1961 (63 y.o. 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 Facility Procedures : CPT4 Code: [id number] Description: 11042 - DEB SUBQ TISSUE 20 SQ CM/< ICD-10 Diagnosis Description L97.812 Non-pressure chronic ulcer of other part of right lower leg with fat layer exp Modifier: osed Quantity: 1 Physician Procedures : CPT4 Code Description Modifier [id number] - WC PHYS LEVEL 4 - EST PT 25 ICD-10 Diagnosis Description L97.812 Non-pressure chronic ulcer of other part of right lower leg with fat layer exposed I87.2 Venous insufficiency (chronic) (peripheral) M31.30  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

## 2023-12-24 NOTE — Progress Notes (Signed)
 [place], [name] ([id number]) [id number] Page 1 of 14 Visit Report for 12/24/2023 Arrival Information Details Patient Name: Date of Service: Shawn [name] Shawn [name] 12/24/2023 7:30 A M Medical Record Number: [id number] Patient Account Number: [id number] Date of Birth/Sex: Treating RN: [birth year] (63 y.o. NETTY Merleen Handing Primary Care Danaria Larsen: ARMOND OLEGARIO FRIEZE Other Clinician: Referring Flornce Record: Treating Admire Bunnell/Extender: Marolyn Delon Meredeth Prentice Devra in Treatment: 9 Visit Information History Since Last Visit Added or deleted any medications: Yes Patient Arrived: Ambulatory Any new allergies or adverse reactions: No Arrival Time: 07:40 Had a fall or experienced change in No Accompanied By: self activities of daily living that may affect Transfer Assistance: None risk of falls: Patient Identification Verified: Yes Signs or symptoms of abuse/neglect since last visito No Secondary Verification Process Completed: Yes Hospitalized since last visit: No Patient Requires Transmission-Based Precautions: No Implantable device outside of the clinic excluding No Patient Has Alerts: No cellular tissue based products placed in the center since last visit: Has Dressing in Place as Prescribed: Yes Pain Present Now: No Electronic Signature(s) Signed: 12/24/2023 4:46:16 PM By: Merleen Handing RN, BSN Entered By: Boehlein, Linda on 12/24/2023 04:49:30 -------------------------------------------------------------------------------- Encounter Discharge Information Details Patient Name: Date of Service: Shawn [name], Shawn [name] 12/24/2023 7:30 A M Medical Record Number: [id number] Patient Account Number: [id number] Date of Birth/Sex: Treating RN: 11-Jan-1961 (63 y.o. NETTY Merleen Handing Primary Care Tanyah Debruyne: ARMOND OLEGARIO FRIEZE Other Clinician: Referring Taelor Waymire: Treating Jamilett Ferrante/Extender: Marolyn Delon Meredeth Prentice Devra in Treatment: 9 Encounter Discharge Information Items Post  Procedure Vitals Discharge Condition: Stable Temperature (F): 98.2 Ambulatory Status: Ambulatory Pulse (bpm): 93 Discharge Destination: Home Respiratory Rate (breaths/min): 18 Transportation: Private Auto Blood Pressure (mmHg): 132/81 Accompanied By: self Schedule Follow-up Appointment: Yes Clinical Summary of Care: Patient Declined Electronic Signature(s) Signed: 12/24/2023 4:46:16 PM By: Merleen Handing RN, BSN Entered By: Merleen Handing on 12/24/2023 05:24:38 Shawn [name] ([id number]) [id number] Page 2 of 14 -------------------------------------------------------------------------------- Lower Extremity Assessment Details Patient Name: Date of Service: Shawn [name] Shawn [name] 12/24/2023 7:30 A M Medical Record Number: [id number] Patient Account Number: [id number] Date of Birth/Sex: Treating RN: 1961/02/13 (63 y.o. NETTY Merleen Handing Primary Care Nichalas Coin: ARMOND OLEGARIO FRIEZE Other Clinician: Referring Ajeenah Heiny: Treating Yuritzi Kamp/Extender: Marolyn Delon Meredeth Prentice Devra in Treatment: 9 Edema Assessment Assessed: [Left: No] [Right: No] Edema: [Left: N] [Right: o] Calf Left: Right: Point of Measurement: From Medial Instep 38 cm Ankle Left: Right: Point of Measurement: From Medial Instep 26 cm Vascular Assessment Pulses: Dorsalis Pedis Palpable: [Right:Yes] Extremity colors, hair growth, and conditions: Extremity Color: [Right:Hyperpigmented] Hair Growth on Extremity: [Right:Yes] Temperature of Extremity: [Right:Warm] Capillary Refill: [Right:< 3 seconds] Dependent Rubor: [Right:No No] Electronic Signature(s) Signed: 12/24/2023 4:46:16 PM By: Merleen Handing RN, BSN Entered By: Merleen Handing on 12/24/2023 04:51:53 -------------------------------------------------------------------------------- Multi Wound Chart Details Patient Name: Date of Service: Shawn [name], Shawn [name] 12/24/2023 7:30 A M Medical Record Number: [id number] Patient Account Number:  [id number] Date of Birth/Sex: Treating RN: 1961-03-07 (63 y.o. M) Primary Care Lissy Deuser: ARMOND OLEGARIO FRIEZE Other Clinician: Referring Samya Siciliano: Treating Susana Duell/Extender: Marolyn Delon Meredeth Prentice Devra in Treatment: 9 Vital Signs Height(in): 70 Pulse(bpm): 93 Weight(lbs): 204 Blood Pressure(mmHg): 132/81 Body Mass Index(BMI): 29.3 Temperature(F): 98.2 Respiratory Rate(breaths/min): 18 [1:Photos:] [[id number] Page 3 of 14] Right, Medial Lower Leg Right, Proximal, Medial Lower Leg Right, Distal, Medial, Anterior Lower Wound Location: Leg Puncture Gradually Appeared Gradually Appeared Wounding Event: Venous Leg Ulcer Venous Leg Ulcer Venous Leg Ulcer Primary Etiology: Anemia, Angina, Arrhythmia, Anemia, Angina, Arrhythmia, Anemia, Angina, Arrhythmia, Comorbid  History: Hypertension, Neuropathy Hypertension, Neuropathy Hypertension, Neuropathy 07/29/2023 10/27/2023 11/10/2023 Date Acquired: 9 6 6  Weeks of Treatment: Open Open Open Wound Status: No No No Wound Recurrence: 0.7x1.3x0.1 3.2x1.5x0.1 0.1x0.1x0.1 Measurements L x W x D (cm) 0.715 3.77 0.008 A (cm) : rea 0.071 0.377 0.001 Volume (cm) : 63.10% -185.80% 74.20% % Reduction in A rea: 63.40% -185.60% 66.70% % Reduction in Volume: Full Thickness Without Exposed Full Thickness Without Exposed Full Thickness Without Exposed Classification: Support Structures Support Structures Support Structures Medium Medium Small Exudate A mount: Serosanguineous Serosanguineous Serosanguineous Exudate Type: red, brown red, brown red, brown Exudate Color: Flat and Intact Flat and Intact Flat and Intact Wound Margin: Large (67-100%) Large (67-100%) None Present (0%) Granulation A mount: Red Red N/A Granulation Quality: Small (1-33%) None Present (0%) None Present (0%) Necrotic A mount: Fat Layer (Subcutaneous Tissue): Yes Fat Layer (Subcutaneous Tissue): Yes Fascia: No Exposed  Structures: Fascia: No Fascia: No Fat Layer (Subcutaneous Tissue): No Tendon: No Tendon: No Tendon: No Muscle: No Muscle: No Muscle: No Joint: No Joint: No Joint: No Bone: No Bone: No Bone: No Small (1-33%) Medium (34-66%) Large (67-100%) Epithelialization: Debridement - Excisional Debridement - Excisional N/A Debridement: Pre-procedure Verification/Time Out 08:10 08:10 N/A Taken: Lidocaine  4% Topical Solution Lidocaine  4% Topical Solution N/A Pain Control: Subcutaneous, Slough Subcutaneous, Slough N/A Tissue Debrided: Skin/Subcutaneous Tissue Skin/Subcutaneous Tissue N/A Level: 0.71 3.77 N/A Debridement A (sq cm): rea Curette Curette N/A Instrument: Minimum Minimum N/A Bleeding: Pressure Pressure N/A Hemostasis A chieved: 3 3 N/A Procedural Pain: 2 2 N/A Post Procedural Pain: Procedure was tolerated well Procedure was tolerated well N/A Debridement Treatment Response: 0.7x1.3x0.1 3.2x1.5x0.1 N/A Post Debridement Measurements L x W x D (cm) 0.071 0.377 N/A Post Debridement Volume: (cm) No Abnormalities Noted No Abnormalities Noted Periwound Skin Texture: No Abnormalities Noted No Abnormalities Noted No Abnormalities Noted Periwound Skin Moisture: Hemosiderin Staining: Yes Hemosiderin Staining: Yes Hemosiderin Staining: Yes Periwound Skin Color: No Abnormality No Abnormality No Abnormality Temperature: Yes Yes Yes Tenderness on Palpation: Debridement Debridement N/A Procedures Performed: Wound Number: 4 N/A N/A Photos: N/A N/A Right, Distal, Medial, Posterior Lower N/A N/A Wound Location: Leg Gradually Appeared N/A N/A Wounding Event: Venous Leg Ulcer N/A N/A Primary Etiology: Anemia, Angina, Arrhythmia, N/A N/A Comorbid History: Hypertension, Neuropathy 11/10/2023 N/A N/A Date Acquired: 6 N/A N/A Weeks of Treatment: Open N/A N/A Wound Status: No N/A N/A Wound Recurrence: 0.5x0.7x0.1 N/A N/A Measurements L x W x D (cm) 0.275 N/A  N/A A (cm) : rock Shawn Shawn [name] ([id number]) [id number] Page 4 of 14 0.027 N/A N/A Volume (cm) : -192.60% N/A N/A % Reduction in Area: -200.00% N/A N/A % Reduction in Volume: Full Thickness Without Exposed N/A N/A Classification: Support Structures Medium N/A N/A Exudate A mount: Serosanguineous N/A N/A Exudate Type: red, brown N/A N/A Exudate Color: Flat and Intact N/A N/A Wound Margin: Large (67-100%) N/A N/A Granulation A mount: Red N/A N/A Granulation Quality: None Present (0%) N/A N/A Necrotic A mount: Fat Layer (Subcutaneous Tissue): Yes N/A N/A Exposed Structures: Fascia: No Tendon: No Muscle: No Joint: No Bone: No Medium (34-66%) N/A N/A Epithelialization: Debridement - Excisional N/A N/A Debridement: Pre-procedure Verification/Time Out 08:10 N/A N/A Taken: Lidocaine  4% Topical Solution N/A N/A Pain Control: Subcutaneous, Slough N/A N/A Tissue Debrided: Skin/Subcutaneous Tissue N/A N/A Level: 0.27 N/A N/A Debridement A (sq cm): rea Curette N/A N/A Instrument: Minimum N/A N/A Bleeding: Pressure N/A N/A Hemostasis A chieved: 3 N/A N/A Procedural Pain: 2 N/A N/A Post Procedural Pain: Procedure  was tolerated well N/A N/A Debridement Treatment Response: 0.5x0.7x0.1 N/A N/A Post Debridement Measurements L x W x D (cm) 0.027 N/A N/A Post Debridement Volume: (cm) No Abnormalities Noted N/A N/A Periwound Skin Texture: Hemosiderin Staining: Yes N/A N/A Periwound Skin Color: No Abnormality N/A N/A Temperature: Yes N/A N/A Tenderness on Palpation: Debridement N/A N/A Procedures Performed: Treatment Notes Wound #1 (Lower Leg) Wound Laterality: Right, Medial Cleanser Soap and Water Discharge Instruction: May shower and wash wound with dial antibacterial soap and water prior to dressing change. Vashe 5.8 (oz) Discharge Instruction: Cleanse the wound with Vashe prior to applying a clean dressing using gauze sponges,  not tissue or cotton balls. Peri-Wound Care Skin Prep Discharge Instruction: Use skin prep as directed Topical Primary Dressing Hydrofera Blue Ready Transfer Foam, 4x5 (in/in) Discharge Instruction: Apply to wound bed as instructed Secondary Dressing Zetuvit Plus Silicone Border Dressing 7x7(in/in) Discharge Instruction: Apply silicone border over primary dressing as directed. Secured With Compression Wrap Compression Stockings Add-Ons Wound #2 (Lower Leg) Wound Laterality: Right, Medial, Proximal Cleanser Soap and Water Discharge Instruction: May shower and wash wound with dial antibacterial soap and water prior to dressing change. Vashe 5.8 (oz) Discharge Instruction: Cleanse the wound with Vashe prior to applying a clean dressing using gauze sponges, not tissue or cotton balls. [place], [name] ([id number]) [id number] Page 5 of 14 Peri-Wound Care Skin Prep Discharge Instruction: Use skin prep as directed Topical Primary Dressing Hydrofera Blue Ready Transfer Foam, 2.5x2.5 (in/in) Discharge Instruction: Apply directly to wound bed as directed Secondary Dressing Zetuvit Plus Silicone Border Dressing 7x7(in/in) Discharge Instruction: Apply silicone border over primary dressing as directed. Secured With Compression Wrap Compression Stockings Add-Ons Wound #3 (Lower Leg) Wound Laterality: Right, Medial, Anterior, Distal Cleanser Soap and Water Discharge Instruction: May shower and wash wound with dial antibacterial soap and water prior to dressing change. Vashe 5.8 (oz) Discharge Instruction: Cleanse the wound with Vashe prior to applying a clean dressing using gauze sponges, not tissue or cotton balls. Peri-Wound Care Skin Prep Discharge Instruction: Use skin prep as directed Topical Primary Dressing Hydrofera Blue Ready Transfer Foam, 2.5x2.5 (in/in) Discharge Instruction: Apply directly to wound bed as directed Secondary Dressing Zetuvit Plus  Silicone Border Dressing 7x7(in/in) Discharge Instruction: Apply silicone border over primary dressing as directed. Secured With Compression Wrap Compression Stockings Add-Ons Wound #4 (Lower Leg) Wound Laterality: Right, Medial, Posterior, Distal Cleanser Soap and Water Discharge Instruction: May shower and wash wound with dial antibacterial soap and water prior to dressing change. Vashe 5.8 (oz) Discharge Instruction: Cleanse the wound with Vashe prior to applying a clean dressing using gauze sponges, not tissue or cotton balls. Peri-Wound Care Skin Prep Discharge Instruction: Use skin prep as directed Topical Primary Dressing Hydrofera Blue Ready Transfer Foam, 2.5x2.5 (in/in) Discharge Instruction: Apply directly to wound bed as directed Secondary Dressing Zetuvit Plus Silicone Border Dressing 7x7(in/in) Discharge Instruction: Apply silicone border over primary dressing as directed. Secured With [place], [name] ([id number]) [id number] Page 6 of 14 Compression Wrap Compression Stockings Add-Ons Electronic Signature(s) Signed: 12/24/2023 8:30:55 AM By: Marolyn Nest MD FACS Entered By: Marolyn Nest on 12/24/2023 05:30:54 -------------------------------------------------------------------------------- Multi-Disciplinary Care Plan Details Patient Name: Date of Service: Shawn [name] Shawn [name], Shawn [name] 12/24/2023 7:30 A M Medical Record Number: [id number] Patient Account Number: [id number] Date of Birth/Sex: Treating RN: 1961-05-16 (63 y.o. NETTY Merleen Handing Primary Care Areanna Gengler: ARMOND OLEGARIO FRIEZE Other Clinician: Referring Salvadore Valvano: Treating Charelle Petrakis/Extender: Marolyn Nest Meredeth Prentice Devra in Treatment: 9 Multidisciplinary Care Plan reviewed with physician Active Inactive Venous  Leg Ulcer Nursing Diagnoses: Knowledge deficit related to disease process and management Goals: Patient will maintain optimal edema control Date Initiated: 10/28/2023 Target  Resolution Date: 01/13/2024 Goal Status: Active Interventions: Assess peripheral edema status every visit. Provide education on venous insufficiency Notes: Wound/Skin Impairment Nursing Diagnoses: Impaired tissue integrity Knowledge deficit related to ulceration/compromised skin integrity Goals: Ulcer/skin breakdown will have a volume reduction of 30% by week 4 Date Initiated: 10/28/2023 Target Resolution Date: 01/13/2024 Goal Status: Active Interventions: Assess ulceration(s) every visit Provide education on ulcer and skin care Treatment Activities: Skin care regimen initiated : 10/28/2023 Notes: Electronic Signature(s) Signed: 12/24/2023 4:46:16 PM By: Merleen Handing RN, BSN Entered By: Merleen Handing on 12/24/2023 05:06:52 Shawn Shawn [name] ([id number]) [id number] Page 7 of 14 -------------------------------------------------------------------------------- Pain Assessment Details Patient Name: Date of Service: Shawn [name] Shawn [name] 12/24/2023 7:30 A M Medical Record Number: [id number] Patient Account Number: [id number] Date of Birth/Sex: Treating RN: 1961/03/25 (63 y.o. NETTY Merleen Handing Primary Care Elona Yinger: ARMOND OLEGARIO FRIEZE Other Clinician: Referring Cheray Pardi: Treating Erling Arrazola/Extender: Marolyn Delon Meredeth Prentice Devra in Treatment: 9 Active Problems Location of Pain Severity and Description of Pain Patient Has Paino No Site Locations Rate the pain. Current Pain Level: 0 Pain Management and Medication Current Pain Management: Electronic Signature(s) Signed: 12/24/2023 4:46:16 PM By: Boehlein, Linda RN, BSN Entered By: Boehlein, Linda on 12/24/2023 04:50:03 -------------------------------------------------------------------------------- Patient/Caregiver Education Details Patient Name: Date of Service: Shawn [name], Shawn [name] 1/9/2025andnbsp7:30 A M Medical Record Number: [id number] Patient Account Number: [id number] Date of Birth/Gender: Treating  RN: 01-Feb-1961 (63 y.o. NETTY Merleen Handing Primary Care Physician: ARMOND OLEGARIO FRIEZE Other Clinician: Referring Physician: Treating Physician/Extender: Marolyn Delon Meredeth Prentice Devra in Treatment: 9 Education Assessment Education Provided To: Patient Education Topics Provided Wound/Skin Impairment: Methods: Explain/Verbal Responses: Reinforcements needed, State content correctly [place], Shawn [name] ([id number]) [id number] Page 8 of 14 Electronic Signature(s) Signed: 12/24/2023 4:46:16 PM By: Boehlein, Linda RN, BSN Entered By: Boehlein, Linda on 12/24/2023 05:07:11 -------------------------------------------------------------------------------- Wound Assessment Details Patient Name: Date of Service: Shawn [name] FLOYDE [name] 12/24/2023 7:30 A M Medical Record Number: [id number] Patient Account Number: [id number] Date of Birth/Sex: Treating RN: [birth year] (63 y.o. NETTY Merleen Handing Primary Care Carnell Beavers: ARMOND OLEGARIO FRIEZE Other Clinician: Referring Sundiata Ferrick: Treating Halle Davlin/Extender: Marolyn Delon Meredeth Prentice Devra in Treatment: 9 Wound Status Wound Number: 1 Primary Etiology: Venous Leg Ulcer Wound Location: Right, Medial Lower Leg Wound Status: Open Wounding Event: Puncture Notes: biopsy for Rash Date Acquired: 07/29/2023 Comorbid History: Anemia, Angina, Arrhythmia, Hypertension, Neuropathy Weeks Of Treatment: 9 Clustered Wound: No Photos Wound Measurements Length: (cm) 0.7 Width: (cm) 1.3 Depth: (cm) 0.1 Area: (cm) 0.715 Volume: (cm) 0.071 % Reduction in Area: 63.1% % Reduction in Volume: 63.4% Epithelialization: Small (1-33%) Tunneling: No Undermining: No Wound Description Classification: Full Thickness Without Exposed Support Wound Margin: Flat and Intact Exudate Amount: Medium Exudate Type: Serosanguineous Exudate Color: red, brown Structures Foul Odor After Cleansing: No Slough/Fibrino Yes Wound Bed Granulation Amount: Large (67-100%)  Exposed Structure Granulation Quality: Red Fascia Exposed: No Necrotic Amount: Small (1-33%) Fat Layer (Subcutaneous Tissue) Exposed: Yes Necrotic Quality: Adherent Slough Tendon Exposed: No Muscle Exposed: No Joint Exposed: No Bone Exposed: No Periwound Skin Texture Texture Color No Abnormalities Noted: Yes No Abnormalities Noted: No Hemosiderin Staining: Yes Moisture No Abnormalities Noted: Yes Temperature / Pain Temperature: No Abnormality Shawn [name], Shawn [name] ([id number]) [id number] Page 9 of 14 Tenderness on Palpation: Yes Treatment Notes Wound #1 (Lower Leg) Wound Laterality: Right, Medial Cleanser Soap and Water Discharge Instruction: May shower and wash  wound with dial antibacterial soap and water prior to dressing change. Vashe 5.8 (oz) Discharge Instruction: Cleanse the wound with Vashe prior to applying a clean dressing using gauze sponges, not tissue or cotton balls. Peri-Wound Care Skin Prep Discharge Instruction: Use skin prep as directed Topical Primary Dressing Hydrofera Blue Ready Transfer Foam, 4x5 (in/in) Discharge Instruction: Apply to wound bed as instructed Secondary Dressing Zetuvit Plus Silicone Border Dressing 7x7(in/in) Discharge Instruction: Apply silicone border over primary dressing as directed. Secured With Compression Wrap Compression Stockings Facilities Manager) Signed: 12/24/2023 4:46:16 PM By: Boehlein, Linda RN, BSN Entered By: Boehlein, Linda on 12/24/2023 05:00:31 -------------------------------------------------------------------------------- Wound Assessment Details Patient Name: Date of Service: Shawn [name] FLOYDE [name] 12/24/2023 7:30 A M Medical Record Number: [id number] Patient Account Number: [id number] Date of Birth/Sex: Treating RN: 04/11/1961 (63 y.o. NETTY Merleen Handing Primary Care Javarus Dorner: ARMOND OLEGARIO FRIEZE Other Clinician: Referring Teresia Myint: Treating Samona Chihuahua/Extender: Marolyn Delon Meredeth Prentice Devra in Treatment: 9 Wound Status Wound Number: 2 Primary Etiology: Venous Leg Ulcer Wound Location: Right, Proximal, Medial Lower Leg Wound Status: Open Wounding Event: Gradually Appeared Comorbid History: Anemia, Angina, Arrhythmia, Hypertension, Neuropathy Date Acquired: 10/27/2023 Weeks Of Treatment: 6 Clustered Wound: No Photos [place], [name] ([id number]) [id number] Page 10 of 14 Wound Measurements Length: (cm) 3.2 Width: (cm) 1.5 Depth: (cm) 0.1 Area: (cm) 3.77 Volume: (cm) 0.377 % Reduction in Area: -185.8% % Reduction in Volume: -185.6% Epithelialization: Medium (34-66%) Tunneling: No Undermining: No Wound Description Classification: Full Thickness Without Exposed Support Structures Wound Margin: Flat and Intact Exudate Amount: Medium Exudate Type: Serosanguineous Exudate Color: red, brown Foul Odor After Cleansing: No Slough/Fibrino No Wound Bed Granulation Amount: Large (67-100%) Exposed Structure Granulation Quality: Red Fascia Exposed: No Necrotic Amount: None Present (0%) Fat Layer (Subcutaneous Tissue) Exposed: Yes Tendon Exposed: No Muscle Exposed: No Joint Exposed: No Bone Exposed: No Periwound Skin Texture Texture Color No Abnormalities Noted: Yes No Abnormalities Noted: No Hemosiderin Staining: Yes Moisture No Abnormalities Noted: Yes Temperature / Pain Temperature: No Abnormality Tenderness on Palpation: Yes Treatment Notes Wound #2 (Lower Leg) Wound Laterality: Right, Medial, Proximal Cleanser Soap and Water Discharge Instruction: May shower and wash wound with dial antibacterial soap and water prior to dressing change. Vashe 5.8 (oz) Discharge Instruction: Cleanse the wound with Vashe prior to applying a clean dressing using gauze sponges, not tissue or cotton balls. Peri-Wound Care Skin Prep Discharge Instruction: Use skin prep as directed Topical Primary Dressing Hydrofera Blue Ready Transfer  Foam, 2.5x2.5 (in/in) Discharge Instruction: Apply directly to wound bed as directed Secondary Dressing Zetuvit Plus Silicone Border Dressing 7x7(in/in) Discharge Instruction: Apply silicone border over primary dressing as directed. Secured With [employer] Compression Stockings [place], [place] ([id number]) [id number] Page 11 of 14 Add-Ons Electronic Signature(s) Signed: 12/24/2023 4:46:16 PM By: Boehlein, Linda RN, BSN Entered By: Boehlein, Linda on 12/24/2023 05:01:42 -------------------------------------------------------------------------------- Wound Assessment Details Patient Name: Date of Service: Shawn [name] FLOYDE [name] 12/24/2023 7:30 A M Medical Record Number: [id number] Patient Account Number: [id number] Date of Birth/Sex: Treating RN: 02/07/1961 (63 y.o. NETTY Merleen Handing Primary Care Dace Denn: ARMOND OLEGARIO FRIEZE Other Clinician: Referring Lindzey Zent: Treating Jalyiah Shelley/Extender: Marolyn Delon Meredeth Prentice Devra in Treatment: 9 Wound Status Wound Number: 3 Primary Etiology: Venous Leg Ulcer Wound Location: Right, Distal, Medial, Anterior Lower Leg Wound Status: Open Wounding Event: Gradually Appeared Comorbid History: Anemia, Angina, Arrhythmia, Hypertension, Neuropathy Date Acquired: 11/10/2023 Weeks Of Treatment: 6 Clustered Wound: No Photos Wound Measurements Length: (cm) 0.1 Width: (cm) 0.1 Depth: (cm) 0.1 Area: (cm) 0.008  Volume: (cm) 0.001 % Reduction in Area: 74.2% % Reduction in Volume: 66.7% Epithelialization: Large (67-100%) Tunneling: No Undermining: No Wound Description Classification: Full Thickness Without Exposed Support Wound Margin: Flat and Intact Exudate Amount: Small Exudate Type: Serosanguineous Exudate Color: red, brown Structures Foul Odor After Cleansing: No Slough/Fibrino No Wound Bed Granulation Amount: None Present (0%) Exposed Structure Necrotic Amount: None Present (0%) Fascia Exposed: No Fat Layer  (Subcutaneous Tissue) Exposed: No Tendon Exposed: No Muscle Exposed: No Joint Exposed: No Bone Exposed: No Periwound Skin Texture Texture Color No Abnormalities Noted: No No Abnormalities Noted: No Hemosiderin Staining: Yes Moisture No Abnormalities Noted: Yes Temperature / Pain Shawn [name], Shawn [name] ([id number]) [id number] Page 12 of 14 Temperature: No Abnormality Tenderness on Palpation: Yes Treatment Notes Wound #3 (Lower Leg) Wound Laterality: Right, Medial, Anterior, Distal Cleanser Soap and Water Discharge Instruction: May shower and wash wound with dial antibacterial soap and water prior to dressing change. Vashe 5.8 (oz) Discharge Instruction: Cleanse the wound with Vashe prior to applying a clean dressing using gauze sponges, not tissue or cotton balls. Peri-Wound Care Skin Prep Discharge Instruction: Use skin prep as directed Topical Primary Dressing Hydrofera Blue Ready Transfer Foam, 2.5x2.5 (in/in) Discharge Instruction: Apply directly to wound bed as directed Secondary Dressing Zetuvit Plus Silicone Border Dressing 7x7(in/in) Discharge Instruction: Apply silicone border over primary dressing as directed. Secured With Compression Wrap Compression Stockings Facilities Manager) Signed: 12/24/2023 4:46:16 PM By: Boehlein, Linda RN, BSN Entered By: Boehlein, Linda on 12/24/2023 05:05:47 -------------------------------------------------------------------------------- Wound Assessment Details Patient Name: Date of Service: Shawn [name] FLOYDE [name] 12/24/2023 7:30 A M Medical Record Number: [id number] Patient Account Number: [id number] Date of Birth/Sex: Treating RN: Jan 25, 1961 (63 y.o. NETTY Merleen Handing Primary Care Yailin Biederman: ARMOND OLEGARIO FRIEZE Other Clinician: Referring Joenathan Sakuma: Treating Dudley Mages/Extender: Marolyn Delon Meredeth Prentice Devra in Treatment: 9 Wound Status Wound Number: 4 Primary Etiology: Venous Leg Ulcer Wound Location:  Right, Distal, Medial, Posterior Lower Leg Wound Status: Open Wounding Event: Gradually Appeared Comorbid History: Anemia, Angina, Arrhythmia, Hypertension, Neuropathy Date Acquired: 11/10/2023 Weeks Of Treatment: 6 Clustered Wound: No Photos [place], Shawn [name] ([id number]) [id number] Page 13 of 14 Wound Measurements Length: (cm) 0.5 Width: (cm) 0.7 Depth: (cm) 0.1 Area: (cm) 0.275 Volume: (cm) 0.027 % Reduction in Area: -192.6% % Reduction in Volume: -200% Epithelialization: Medium (34-66%) Tunneling: No Undermining: No Wound Description Classification: Full Thickness Without Exposed Support Structures Wound Margin: Flat and Intact Exudate Amount: Medium Exudate Type: Serosanguineous Exudate Color: red, brown Foul Odor After Cleansing: No Slough/Fibrino No Wound Bed Granulation Amount: Large (67-100%) Exposed Structure Granulation Quality: Red Fascia Exposed: No Necrotic Amount: None Present (0%) Fat Layer (Subcutaneous Tissue) Exposed: Yes Tendon Exposed: No Muscle Exposed: No Joint Exposed: No Bone Exposed: No Periwound Skin Texture Texture Color No Abnormalities Noted: Yes No Abnormalities Noted: No Hemosiderin Staining: Yes Moisture No Abnormalities Noted: No Temperature / Pain Temperature: No Abnormality Tenderness on Palpation: Yes Treatment Notes Wound #4 (Lower Leg) Wound Laterality: Right, Medial, Posterior, Distal Cleanser Soap and Water Discharge Instruction: May shower and wash wound with dial antibacterial soap and water prior to dressing change. Vashe 5.8 (oz) Discharge Instruction: Cleanse the wound with Vashe prior to applying a clean dressing using gauze sponges, not tissue or cotton balls. Peri-Wound Care Skin Prep Discharge Instruction: Use skin prep as directed Topical Primary Dressing Hydrofera Blue Ready Transfer Foam, 2.5x2.5 (in/in) Discharge Instruction: Apply directly to wound bed as directed Secondary  Dressing Zetuvit Plus Silicone Border Dressing 7x7(in/in) Discharge Instruction: Apply silicone border over  primary dressing as directed. Secured With [employer] Compression Stockings Minong, UTAH ([id number]) [id number] Page 14 of 14 Add-Ons Electronic Signature(s) Signed: 12/24/2023 4:46:16 PM By: Boehlein, Linda RN, BSN Entered By: Boehlein, Linda on 12/24/2023 05:05:33 -------------------------------------------------------------------------------- Vitals Details Patient Name: Date of Service: Shawn [name] Shawn [name], Shawn [name] 12/24/2023 7:30 A M Medical Record Number: [id number] Patient Account Number: [id number] Date of Birth/Sex: Treating RN: 08-15-1961 (63 y.o. NETTY Merleen Handing Primary Care Katalea Ucci: ARMOND OLEGARIO FRIEZE Other Clinician: Referring Shani Fitch: Treating Raju Coppolino/Extender: Marolyn Delon Meredeth Prentice Devra in Treatment: 9 Vital Signs Time Taken: 07:49 Temperature (F): 98.2 Height (in): 70 Pulse (bpm): 93 Weight (lbs): 204 Respiratory Rate (breaths/min): 18 Body Mass Index (BMI): 29.3 Blood Pressure (mmHg): 132/81 Reference Range: 80 - 120 mg / dl Electronic Signature(s) Signed: 12/24/2023 4:46:16 PM By: Merleen Handing RN, BSN Entered By: Boehlein, Linda on 12/24/2023 04:49:56

## 2023-12-28 NOTE — Progress Notes (Signed)
 Cardiology Clinic Note   Patient Name: Shawn [name] Date of Encounter: 12/29/2023  Primary Care Provider:  Sherre Geni LABOR, NP Primary Cardiologist:  Stanly LABOR Leavens, MD  Patient Profile    Shawn [name] 63 year old male presents to the clinic today for evaluation of his shortness of breath.  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-followed by Dr Revonda   Multiple thyroid  nodules    Onychomycosis    treated with lamisil in the past   Other chronic pain    Pain in right hand    Palpitations    Prediabetes    Pruritus    Retinal detachment    right eye caused blindness   Tinea cruris    Tinnitus    Total retinal detachment of right eye    Past Surgical History:  Procedure Laterality Date   APPENDECTOMY     BACK SURGERY     COLONOSCOPY     COLONOSCOPY WITH ESOPHAGOGASTRODUODENOSCOPY (EGD)  02/23/2023   CYSTOSCOPY W/ URETERAL STENT PLACEMENT  08/24/2012   Procedure: CYSTOSCOPY WITH RETROGRADE PYELOGRAM/URETERAL STENT PLACEMENT;  Surgeon: Arlena LILLETTE Gal, MD;  Location: [facility];  Service: Urology;  Laterality: Right;   CYSTOSCOPY/RETROGRADE/URETEROSCOPY  08/24/2012   Procedure: CYSTOSCOPY/RETROGRADE/URETEROSCOPY;  Surgeon: Arlena LILLETTE Gal, MD;  Location: [facility];  Service: Urology;  Laterality: Right;  Stone Basketing   CYSTOSCOPY/RETROGRADE/URETEROSCOPY/STONE EXTRACTION WITH BASKET Left  04/11/2013   Procedure: CYSTOSCOPY/RETROGRADE/URETEROSCOPY/STONE EXTRACTION WITH BASKET;  Surgeon: Arlena LILLETTE Gal, MD;  Location: [facility];  Service: Urology;  Laterality: Left;  BASKET STONE EXTRACTION WITH URETEROSCOPE     Right retina detachment      Allergies  No Known Allergies  History of Present Illness    Shawn [name] has PMH of HTN, atrial tachycardia, PVCs, near syncope, GERD, hyponatremia, right eye blindness, sepsis, and lower extremity edema.  In 12-05-22 he h was noted to have lost weight.  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  CMR was planned but he was lost to follow-up.  He eventually had CMR which showed no evidence of cardiac amyloidosis.  He did have a friend who was followed briefly by our service who died of AL amyloidosis.  In 12/06/23 he showed no evidence of nephrotic syndrome.  He received steroid therapy which marginally improved his hearing.  He underwent tissue biopsy of his red macular rash.  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  On follow-up with Dr. Leavens 04/27/2023 he was doing okay.  He denied chest pain and pressure.  He denied shortness of breath, DOE, PND and orthopnea.  He denied palpitations.  His EKG showed sinus rhythm.  His blood pressure was well-controlled.  His beta-blocker therapy was continued for his atrial tachycardia.  1 year  follow-up was planned.  He presents to the clinic today for evaluation of his shortness of breath.  He states he had 2 episodes of accelerated heart rate.  These episodes happened several months ago and were before his back surgery in November.  He notes that during the episodes his heart rate was in the 120-130 range.  He reports that he had complications with his blood pressure during his recovery from his back surgery.  He was typically taking his lisinopril  and his metoprolol  about 12 hours apart.  His medications were given together.  His lisinopril  was  discontinued at that time.  His blood pressure remains well-controlled.  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  His dermatologist took lower extremity biopsies which developed into multiple wounds.  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  He is following with ENT and dermatology.  Given that he has not had further episodes of accelerated heart rate or shortness of breath he wishes to defer further monitoring at this time.  I offered cardiac event monitor.  He wishes to defer.  I advised that he may take an extra half dose of Toprol  all for sustained palpitations.  I recommended that he avoid caffeine, chocolate, EtOH, and dehydration.  He reports that he enjoys caffeine, does eat chocolate regularly, and has been working on increasing his p.o. hydration.  I will plan follow-up in 6 months..  Today he denies chest pain, shortness of breath, lower extremity edema, fatigue, palpitations, melena, hematuria, hemoptysis, diaphoresis, weakness, presyncope, syncope, orthopnea, and PND.   Home Medications    Prior to Admission medications   Medication Sig Start Date End Date Taking? Authorizing Provider  B-D 3CC LUER-LOK SYR 21GX1-1/2 21G X 1-1/2 3 ML MISC USE AS DIRECTED FOR TESTOSTERONE  ADMINISTRATION 12/04/23   Stoioff, Glendia BROCKS, MD  gabapentin (NEURONTIN) 300 MG capsule Take by mouth. Take 1 tab in the morning and 2 tabs in the evening 01/22/22   [provider]  lisinopril  (ZESTRIL ) 20 MG tablet Take 1 tablet (20 mg total) by mouth daily. 08/31/23   Santo Stanly LABOR, MD  metoprolol  succinate (TOPROL -XL) 50 MG 24 hr tablet Take 1 tablet (50 mg total) by mouth daily. 10/24/22   Chandrasekhar, Stanly LABOR, MD  mupirocin ointment (BACTROBAN) 2 % 1 Application 2 (two) times daily.    [provider]  pantoprazole  (PROTONIX ) 20 MG tablet Take 1 tablet (20 mg total) by mouth daily. 05/26/23   Nandigam, Kavitha V, MD  pantoprazole  (PROTONIX ) 40 MG tablet  TAKE 1 TABLET (40 MG TOTAL) BY MOUTH TWICE A DAY BEFORE MEALS 10/16/23   Nandigam, Kavitha V, MD  testosterone  cypionate (DEPOTESTOSTERONE CYPIONATE) 200 MG/ML injection Inject 1.5 mLs (300 mg total) into the muscle every 14 (fourteen) days. 12/24/23   Stoioff, Glendia BROCKS, MD    Family History    Family History  Problem Relation Age of Onset   Diabetes Mother    Stroke Mother    Aneurysm Mother    Cancer Mother    Heart attack Father    Hypertension Father    CAD Father    Heart disease Father    Bladder Cancer Brother    Diabetes Maternal Grandmother    Hypertension Maternal Grandmother    Aneurysm Paternal Grandmother    Heart attack Paternal Grandfather    Hypertension Paternal Grandfather    Thyroid  disease Neg Hx    He indicated that his mother is  deceased. He indicated that his father is deceased. He indicated that three of his four brothers are alive. He indicated that his maternal grandmother is deceased. He indicated that his maternal grandfather is deceased. He indicated that his paternal grandmother is deceased. He indicated that his paternal grandfather is deceased. He indicated that the status of his neg hx is unknown.  Social History    Social History   Socioeconomic History   Marital status: Married    Spouse name: Not on file   Number of children: Not on file   Years of education: Not on file   Highest education level: Not on file  Occupational History   Not on file  Tobacco Use   Smoking status: Former    Types: Pipe   Smokeless tobacco: Never  Vaping Use   Vaping status: Never Used  Substance and Sexual Activity   Alcohol use: Yes    Comment: ocassionally beer or wine   Drug use: No   Sexual activity: Never  Other Topics Concern   Not on file  Social History Narrative   Not on file   Social Drivers of Health   Financial Resource Strain: Not on file  Food Insecurity: Not on file  Transportation Needs: Not on file  Physical Activity: Not on file   Stress: Not on file  Social Connections: Unknown (03/10/2023)   Received from [facility]   Social Network    Social Network: Not on file  Intimate Partner Violence: Unknown (03/10/2023)   Received from [facility], [facility]   HITS    Physically Hurt: Not on file    Insult or Talk Down To: Not on file    Threaten Physical Harm: Not on file    Scream or Curse: Not on file     Review of Systems    General:  No chills, fever, night sweats or weight changes.  Cardiovascular:  No chest pain, dyspnea on exertion, edema, orthopnea, palpitations, paroxysmal nocturnal dyspnea. Dermatological: No rash, lesions/masses Respiratory: No cough, dyspnea Urologic: No hematuria, dysuria Abdominal:   No nausea, vomiting, diarrhea, bright red blood per rectum, melena, or hematemesis Neurologic:  No visual changes, wkns, changes in mental status. All other systems reviewed and are otherwise negative except as noted above.  Physical Exam    VS:  BP 110/70 (BP Location: Left Arm, Patient Position: Sitting, Cuff Size: Normal)   Pulse 88   Ht 5' 11 (1.803 m)   Wt 194 lb (88 kg)   SpO2 97%   BMI 27.06 kg/m  , BMI Body mass index is 27.06 kg/m. GEN: Well nourished, well developed, in no acute distress. HEENT: normal.  Hard of hearing Neck: Supple, no JVD, carotid bruits, or masses. Cardiac: RRR, no murmurs, rubs, or gallops. No clubbing, cyanosis, edema.  Radials/DP/PT 2+ and equal bilaterally.  Respiratory:  Respirations regular and unlabored, clear to auscultation bilaterally. GI: Soft, nontender, nondistended, BS + x 4. MS: no deformity or atrophy. Skin: warm and dry, no rash.  Bilateral lower extremity petechiae Neuro:  Strength and sensation are intact. Psych: Normal affect.  Accessory Clinical Findings    Recent Labs: 01/30/2023: TSH 0.27 03/16/2023: ALT 13; BUN 10; Creatinine 0.83; Platelet Count 339; Potassium 4.0; Sodium 139 12/04/2023: Hemoglobin 12.4    Recent Lipid Panel No results found for: CHOL, TRIG, HDL, CHOLHDL, VLDL, LDLCALC, LDLDIRECT       ECG personally reviewed by me today-none today.     Echocardiogram 07/04/2022  IMPRESSIONS  1. Left ventricular ejection fraction, by estimation, is 55 to 60%. Left  ventricular ejection fraction by 3D volume is 56 %. The left ventricle has  normal function. The left ventricle has no regional wall motion  abnormalities. Left ventricular diastolic   parameters were normal.   2. Right ventricular systolic function is normal. The right ventricular  size is normal. Tricuspid regurgitation signal is inadequate for assessing  PA pressure.   3. The mitral valve is normal in structure. Trivial mitral valve  regurgitation. No evidence of mitral stenosis.   4. The aortic valve is tricuspid. Aortic valve regurgitation is not  visualized.   5. Small Echodensity seen in left atrium in imaging series 58 and 64 not  seen in other series.   Conclusion(s)/Recommendation(s): Consider CMR if clinically indicated.   FINDINGS   Left Ventricle: Left ventricular ejection fraction, by estimation, is 55  to 60%. Left ventricular ejection fraction by 3D volume is 56 %. The left  ventricle has normal function. The left ventricle has no regional wall  motion abnormalities. The left  ventricular internal cavity size was normal in size. There is no left  ventricular hypertrophy. Left ventricular diastolic parameters were  normal.   Right Ventricle: The right ventricular size is normal. No increase in  right ventricular wall thickness. Right ventricular systolic function is  normal. Tricuspid regurgitation signal is inadequate for assessing PA  pressure.   Left Atrium: Left atrial size was normal in size.   Right Atrium: Right atrial size was normal in size.   Pericardium: There is no evidence of pericardial effusion.   Mitral Valve: The mitral valve is normal in structure.  Trivial mitral  valve regurgitation. No evidence of mitral valve stenosis.   Tricuspid Valve: The tricuspid valve is normal in structure. Tricuspid  valve regurgitation is trivial. No evidence of tricuspid stenosis.   Aortic Valve: The aortic valve is tricuspid. Aortic valve regurgitation is  not visualized.   Pulmonic Valve: The pulmonic valve was normal in structure. Pulmonic valve  regurgitation is mild. No evidence of pulmonic stenosis.   Aorta: The aortic root and ascending aorta are structurally normal, with  no evidence of dilitation.   IAS/Shunts: The atrial septum is grossly normal.   Cardiac MRI 12/03/2022  FINDINGS: 1. Very mild increase in left ventricular size, with LVEDD 54 mm, but LVEDV 200 mL, and LVEDVi 93 mL/m2.   Normal left ventricular thickness, with intraventricular septal thickness of 10 mm, posterior wall thickness of 7 mm, and myocardial mass index of 55 g/m2.   Normal left ventricular systolic function (LVEF =56%). There are no regional wall motion abnormalities.   Left ventricular parametric mapping notable for normal native T1 and T2 signal.   There is no late gadolinium enhancement in the left ventricular myocardium.   2.  Mild increase in right ventricular size with RVEDVI 114 mL/m2.   Normal right ventricular thickness.   Normal right ventricular systolic function (RVEF =51%). There are no regional wall motion abnormalities or aneurysms.   3. Normal left and right atrial size. There is a prominent coumadin ridge that appears to bifurcate. It is isointense on T1 and T2. There is no evidence of enhancement. High inversion time sequence not performed.   4. Normal size of the aortic root, ascending aorta and pulmonary artery.   5. Valve assessment:   Aortic Valve: Tri-leaflet aortic valve. There is no significant regurgitation. Regurgitant fraction 1%.   Pulmonic Valve: There is no significant regurgitation. Regurgitant  fraction  2%.   Tricuspid Valve: Qualitatively there is no significant regurgitation. The is breathhold artifact on RV volumes.   Mitral Valve: There is no significant regurgitation. Regurgitant fraction 4%.   6.  Normal pericardium.  No pericardial effusion.   7. Grossly, no extracardiac findings. Recommended dedicated study if concerned for non-cardiac pathology.   IMPRESSION: Study not consistent with cardiac amyloidosis.   Left atrial echodensity is likely prominent coumadin ridge (normal variant structure).   Stanly Leavens MD     Electronically Signed   By: Stanly Leavens M.D.   On: 12/03/2022 14:25   Assessment & Plan   1.  Shortness of breath, DOE-noted 2 episodes of increased work of breathing with episodes of accelerated heart rate.  He reports an episode that lasted 15 minutes or less and an episode that lasted about 20 minutes.  Both episodes broke spontaneously and were between 1 and 3 AM.  He has not had any episodes since before November 18. Heart healthy low-sodium diet Increase physical activity as tolerated Continue current medical therapy Avoid triggers caffeine, chocolate, EtOH, dehydration etc. May take an extra half dose of metoprolol  for sustained episodes of palpitations  History of atrial tachycardia-heart rate today 88 bpm. Continue beta-blocker therapy Avoid triggers caffeine, chocolate, EtOH, dehydration etc. Maintain physical activity Essential hypertension-BP today 110/70. Maintain blood pressure log Continue beta-blocker, ACE Heart healthy low-sodium diet  Hyperlipidemia, aortic atherosclerosis-LDL 71 on11/23/22. High-fiber diet Increase physical activity as tolerated Follows with PCP  Disposition: Follow-up with Dr. Leavens or me in 6 months.   Josefa HERO. Marleni Gallardo NP-C     12/29/2023, 9:50 AM [facility] [address] Office [phone] Fax [phone]    I spent 14 minutes  examining this patient, reviewing medications, and using patient centered shared decision making involving their cardiac care.   I spent greater than 20 minutes reviewing their past medical history,  medications, and prior cardiac tests.

## 2023-12-29 ENCOUNTER — Encounter: Payer: Self-pay | Admitting: General Practice

## 2023-12-29 ENCOUNTER — Ambulatory Visit: Payer: PRIVATE HEALTH INSURANCE | Attending: General Practice | Admitting: General Practice

## 2023-12-29 VITALS — BP 110/70 | HR 88 | Ht 71.0 in | Wt 194.0 lb

## 2023-12-29 DIAGNOSIS — R0609 Other forms of dyspnea: Secondary | ICD-10-CM

## 2023-12-29 DIAGNOSIS — I4719 Other supraventricular tachycardia: Secondary | ICD-10-CM | POA: Diagnosis not present

## 2023-12-29 DIAGNOSIS — I10 Essential (primary) hypertension: Secondary | ICD-10-CM

## 2023-12-29 DIAGNOSIS — E782 Mixed hyperlipidemia: Secondary | ICD-10-CM

## 2023-12-29 NOTE — Patient Instructions (Addendum)
 Medication Instructions:  OK TO TAKE EXTRA 1/2 (25 MG) METOPROLOL  50MG  *If you need a refill on your cardiac medications before your next appointment, please call your pharmacy*  Lab Work: NONE  Testing/Procedures: Other Instructions MAINTAIN PHYSICAL ACTIVITY Please try to avoid these triggers: Do not use any products that have nicotine or tobacco in them. These include cigarettes, e-cigarettes, and chewing tobacco. If you need help quitting, ask your doctor. Eat heart-healthy foods. Talk with your doctor about the right eating plan for you. Exercise regularly as told by your doctor. Stay hydrated Do not drink alcohol, Caffeine or chocolate. Lose weight if you are overweight. Do not use drugs, including cannabis  Follow-Up: At [facility], you and your health needs are our priority.  As part of our continuing mission to provide you with exceptional heart care, we have created designated Provider Care Teams.  These Care Teams include your primary Cardiologist (physician) and Advanced Practice Providers (APPs -  Physician Assistants and Nurse Practitioners) who all work together to provide you with the care you need, when you need it.  Your next appointment:   6 month(s)  Provider:   Stanly DELENA Leavens, MD  or Josefa Beauvais, FNP

## 2024-01-21 ENCOUNTER — Encounter (HOSPITAL_BASED_OUTPATIENT_CLINIC_OR_DEPARTMENT_OTHER): Payer: PRIVATE HEALTH INSURANCE | Attending: General Surgery | Admitting: General Surgery

## 2024-01-21 DIAGNOSIS — I872 Venous insufficiency (chronic) (peripheral): Secondary | ICD-10-CM | POA: Insufficient documentation

## 2024-01-21 DIAGNOSIS — M3130 Wegener's granulomatosis without renal involvement: Secondary | ICD-10-CM | POA: Diagnosis not present

## 2024-01-21 DIAGNOSIS — L97812 Non-pressure chronic ulcer of other part of right lower leg with fat layer exposed: Secondary | ICD-10-CM | POA: Insufficient documentation

## 2024-02-11 ENCOUNTER — Telehealth: Payer: Self-pay | Admitting: Hematology and Oncology

## 2024-02-11 NOTE — Telephone Encounter (Signed)
 Scheduled appointment per patient's wife's request via incoming call. Mrs.Hinderman is aware of the made appointment for the patient.

## 2024-02-18 ENCOUNTER — Ambulatory Visit (HOSPITAL_BASED_OUTPATIENT_CLINIC_OR_DEPARTMENT_OTHER): Payer: PRIVATE HEALTH INSURANCE | Admitting: General Surgery

## 2024-02-20 ENCOUNTER — Other Ambulatory Visit: Payer: Self-pay | Admitting: Urology

## 2024-02-22 ENCOUNTER — Encounter (HOSPITAL_BASED_OUTPATIENT_CLINIC_OR_DEPARTMENT_OTHER): Payer: PRIVATE HEALTH INSURANCE | Attending: General Surgery | Admitting: General Surgery

## 2024-02-22 DIAGNOSIS — L97812 Non-pressure chronic ulcer of other part of right lower leg with fat layer exposed: Secondary | ICD-10-CM | POA: Insufficient documentation

## 2024-02-22 DIAGNOSIS — M3130 Wegener's granulomatosis without renal involvement: Secondary | ICD-10-CM | POA: Insufficient documentation

## 2024-02-22 DIAGNOSIS — I872 Venous insufficiency (chronic) (peripheral): Secondary | ICD-10-CM | POA: Diagnosis not present

## 2024-02-24 NOTE — Assessment & Plan Note (Signed)
 Lab review 01/27/2022: Hemoglobin 11.6, creatinine 0.89, calcium 9.4, B12 474, ANA negative, SPEP: 1.1 g IgG lambda monoclonal protein 02/07/22: Kappa: 23, Lambda 17, ratio: 1.35, Beta 2 microglobulin: 1.9 03/16/2023: Hemoglobin 12.4, M protein: 0.7 g IgG lambda, IgG 1623, kappa 18.9, lambda 23.4, ratio 0.81, creatinine 0.83, albumin 4.2, calcium 9.4 02/11/2024: M protein 1.22 g, creatinine 0.82, albumin 4.5, hemoglobin 13.5  I discussed with the patient that there has been a slight change in the monoclonal protein but it is not very significant to get any additional workup. Recheck labs in 6 months and follow-up after that with a telephone visit

## 2024-02-25 ENCOUNTER — Inpatient Hospital Stay: Payer: PRIVATE HEALTH INSURANCE | Attending: Hematology and Oncology | Admitting: Hematology and Oncology

## 2024-02-25 VITALS — BP 134/82 | HR 97 | Temp 98.7°F | Resp 18 | Ht 71.0 in | Wt 197.1 lb

## 2024-02-25 DIAGNOSIS — R234 Changes in skin texture: Secondary | ICD-10-CM | POA: Diagnosis not present

## 2024-02-25 DIAGNOSIS — Z7952 Long term (current) use of systemic steroids: Secondary | ICD-10-CM | POA: Diagnosis not present

## 2024-02-25 DIAGNOSIS — R070 Pain in throat: Secondary | ICD-10-CM | POA: Diagnosis not present

## 2024-02-25 DIAGNOSIS — D472 Monoclonal gammopathy: Secondary | ICD-10-CM | POA: Diagnosis present

## 2024-02-25 DIAGNOSIS — I776 Arteritis, unspecified: Secondary | ICD-10-CM | POA: Insufficient documentation

## 2024-02-25 DIAGNOSIS — Z79899 Other long term (current) drug therapy: Secondary | ICD-10-CM | POA: Diagnosis not present

## 2024-02-25 MED ORDER — AMOXICILLIN-POT CLAVULANATE 875-125 MG PO TABS
1.0000 | ORAL_TABLET | Freq: Two times a day (BID) | ORAL | 0 refills | Status: AC
Start: 2024-02-25 — End: 2024-03-06

## 2024-02-25 NOTE — Progress Notes (Signed)
 Patient Care Team: Cox, Burman Foster, NP as PCP - General (Family Medicine) Christell Constant, MD as PCP - Cardiology (Cardiology)  DIAGNOSIS:  Encounter Diagnosis  Name Primary?   MGUS (monoclonal gammopathy of unknown significance) Yes   CHIEF COMPLIANT: Follow-up of MGUS  HISTORY OF PRESENT ILLNESS:  History of Present Illness The patient, with a history of vasculitis and MGUS, presents with worsening nasal crusting and throat discomfort. He reports difficulty swallowing and choking at night, which has been progressively worsening over the past 2-3 weeks. The patient has been using a saline flush twice daily for his nose, which has been producing large chunks of what appears to be scabs. He also reports occasional nosebleeds. Despite these symptoms, the patient notes that other aspects of his health, such as his leg sores, appear to be improving.  In addition to these symptoms, the patient has experienced a significant loss of hearing, for which he is currently using hearing aids. He has been approved for a cochlear implant, but is waiting for the inflammation to be under control.  The patient is currently on methotrexate, folic acid, and gabapentin for his vasculitis. He also mentions that he has been taking an iron supplement recently. He reports that his M protein levels have increased to 1.22 grams, up from 0.7 grams at his last check.     ALLERGIES:  has no known allergies.  MEDICATIONS:  Current Outpatient Medications  Medication Sig Dispense Refill   amoxicillin-clavulanate (AUGMENTIN) 875-125 MG tablet Take 1 tablet by mouth 2 (two) times daily for 10 days. 20 tablet 0   B-D 3CC LUER-LOK SYR 21GX1-1/2 21G X 1-1/2" 3 ML MISC USE AS DIRECTED FOR TESTOSTERONE ADMINISTRATION 38 each 1   betamethasone dipropionate 0.05 % cream Apply topically.     gabapentin (NEURONTIN) 300 MG capsule Take by mouth. Take 1 tab in the morning and 2 tabs in the evening     metoprolol succinate  (TOPROL-XL) 50 MG 24 hr tablet Take 1 tablet (50 mg total) by mouth daily. 90 tablet 3   pantoprazole (PROTONIX) 40 MG tablet TAKE 1 TABLET (40 MG TOTAL) BY MOUTH TWICE A DAY BEFORE MEALS 180 tablet 1   testosterone cypionate (DEPOTESTOSTERONE CYPIONATE) 200 MG/ML injection INJECT 1.5 MLS INTO THE MUSCLE EVERY 14 DAYS. 10 mL 0   No current facility-administered medications for this visit.    PHYSICAL EXAMINATION: ECOG PERFORMANCE STATUS: 1 - Symptomatic but completely ambulatory  Vitals:   02/25/24 1300  BP: 134/82  Pulse: 97  Resp: 18  Temp: 98.7 F (37.1 C)  SpO2: 99%   Filed Weights   02/25/24 1300  Weight: 197 lb 1.6 oz (89.4 kg)    Physical Exam   (exam performed in the presence of a chaperone)  LABORATORY DATA:  I have reviewed the data as listed    Latest Ref Rng & Units 03/16/2023   11:34 AM 01/18/2023    7:56 AM 01/27/2022    4:54 PM  CMP  Glucose 70 - 99 mg/dL 96  191  80   BUN 8 - 23 mg/dL 10  11  16    Creatinine 0.61 - 1.24 mg/dL 4.78  2.95  6.21   Sodium 135 - 145 mmol/L 139  136  137   Potassium 3.5 - 5.1 mmol/L 4.0  4.5  4.7   Chloride 98 - 111 mmol/L 104  103  101   CO2 22 - 32 mmol/L 30  27  23    Calcium 8.9 -  10.3 mg/dL 9.4  8.5  9.4   Total Protein 6.5 - 8.1 g/dL 7.7  7.0  7.8   Total Bilirubin 0.3 - 1.2 mg/dL 0.5  1.1  0.3   Alkaline Phos 38 - 126 U/L 52  46  77   AST 15 - 41 U/L 14  18  17    ALT 0 - 44 U/L 13  12  13      Lab Results  Component Value Date   WBC 9.3 03/16/2023   HGB 12.4 (L) 12/04/2023   HCT 37.7 12/04/2023   MCV 90.9 03/16/2023   PLT 339 03/16/2023   NEUTROABS 7.0 03/16/2023    ASSESSMENT & PLAN:  MGUS (monoclonal gammopathy of unknown significance) Lab review 01/27/2022: Hemoglobin 11.6, creatinine 0.89, calcium 9.4, B12 474, ANA negative, SPEP: 1.1 g IgG lambda monoclonal protein 02/07/22: Kappa: 23, Lambda 17, ratio: 1.35, Beta 2 microglobulin: 1.9 03/16/2023: Hemoglobin 12.4, M protein: 0.7 g IgG lambda, IgG 1623, kappa  18.9, lambda 23.4, ratio 0.81, creatinine 0.83, albumin 4.2, calcium 9.4 02/11/2024: M protein 1.22 g, creatinine 0.82, albumin 4.5, hemoglobin 13.5  I discussed with the patient that there has been a slight change in the monoclonal protein but it is not very significant to get any additional workup. 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 Awaiting [employer] approval. Nasal crusting and throat pain possibly related to vasculitis. Awaiting further evaluation at an academic center. - Continue methotrexate and prednisone as prescribed. - Start Augmentin for potential bacterial involvement. - Use cool mist humidifier nightly. - Gargle with salt water or baking soda solution. - Refer to academic center rheumatology for further evaluation.  Nasal crusting and throat pain Significant nasal crusting and throat pain, likely related to vasculitis, with worsening symptoms. - Start Augmentin for potential bacterial involvement. - Use cool mist humidifier nightly. - Gargle with salt water or baking soda solution. - Consider referral to an academic center for further evaluation.  Hearing loss Sudden hearing loss in February 2024, using hearing aids. Cochlear implant approved, pending inflammation control. - Monitor inflammation and proceed with cochlear implant once inflammation is controlled. - Continue with current treatment for vasculitis.  MGUS (Monoclonal Gammopathy of Undetermined Significance) M spike increased to 1.22 grams, consistent with previous levels. No progression to multiple myeloma. Findings suggest stability. - Continue monitoring M spike levels. - Cancel April appointment and schedule follow-up in six months. - Perform blood work prior to the next appointment. - Contact with results if any significant  changes are noted.      Orders Placed This Encounter  Procedures   CBC with Differential (Cancer Center Only)    Standing Status:   Future    Expiration Date:   02/24/2025   CMP (Cancer Center only)    Standing Status:   Future    Expiration Date:   02/24/2025   Multiple Myeloma Panel (SPEP&IFE w/QIG)    Standing Status:   Future    Expiration Date:   02/24/2025   Kappa/lambda light chains    Standing Status:   Future    Expiration Date:   02/24/2025   The patient has a good understanding of the overall plan. he agrees with it. he will call with any problems that may develop before the next visit here. Total time spent: 30 mins including face to face time and time spent for planning, charting and  co-ordination of care   Tamsen Meek, MD 02/25/24

## 2024-03-15 ENCOUNTER — Inpatient Hospital Stay: Payer: PRIVATE HEALTH INSURANCE | Attending: Hematology and Oncology

## 2024-03-15 DIAGNOSIS — Z79899 Other long term (current) drug therapy: Secondary | ICD-10-CM | POA: Diagnosis not present

## 2024-03-15 DIAGNOSIS — D472 Monoclonal gammopathy: Secondary | ICD-10-CM | POA: Insufficient documentation

## 2024-03-15 DIAGNOSIS — H9190 Unspecified hearing loss, unspecified ear: Secondary | ICD-10-CM | POA: Diagnosis not present

## 2024-03-15 LAB — CMP (CANCER CENTER ONLY)
ALT: 16 U/L (ref 0–44)
AST: 13 U/L — ABNORMAL LOW (ref 15–41)
Albumin: 4.6 g/dL (ref 3.5–5.0)
Alkaline Phosphatase: 50 U/L (ref 38–126)
Anion gap: 4 — ABNORMAL LOW (ref 5–15)
BUN: 11 mg/dL (ref 8–23)
CO2: 32 mmol/L (ref 22–32)
Calcium: 9.5 mg/dL (ref 8.9–10.3)
Chloride: 100 mmol/L (ref 98–111)
Creatinine: 0.89 mg/dL (ref 0.61–1.24)
GFR, Estimated: >60 mL/min (ref >60)
Glucose, Bld: 100 mg/dL — ABNORMAL HIGH (ref 70–99)
Potassium: 4.1 mmol/L (ref 3.5–5.1)
Sodium: 136 mmol/L (ref 135–145)
Total Bilirubin: 0.7 mg/dL (ref 0.0–1.2)
Total Protein: 8.1 g/dL (ref 6.5–8.1)

## 2024-03-15 LAB — CBC WITH DIFFERENTIAL (CANCER CENTER ONLY)
Abs Immature Granulocytes: 0.12 10*3/uL — ABNORMAL HIGH (ref 0.00–0.07)
Basophils Absolute: 0.1 10*3/uL (ref 0.0–0.1)
Basophils Relative: 1 %
Eosinophils Absolute: 0.1 10*3/uL (ref 0.0–0.5)
Eosinophils Relative: 1 %
HCT: 43.4 % (ref 39.0–52.0)
Hemoglobin: 14.3 g/dL (ref 13.0–17.0)
Immature Granulocytes: 1 %
Lymphocytes Relative: 17 %
Lymphs Abs: 2.4 10*3/uL (ref 0.7–4.0)
MCH: 30 pg (ref 26.0–34.0)
MCHC: 32.9 g/dL (ref 30.0–36.0)
MCV: 91.2 fL (ref 80.0–100.0)
Monocytes Absolute: 0.8 10*3/uL (ref 0.1–1.0)
Monocytes Relative: 6 %
Neutro Abs: 10.3 10*3/uL — ABNORMAL HIGH (ref 1.7–7.7)
Neutrophils Relative %: 74 %
Platelet Count: 352 10*3/uL (ref 150–400)
RBC: 4.76 MIL/uL (ref 4.22–5.81)
RDW: 15.4 % (ref 11.5–15.5)
WBC Count: 13.9 10*3/uL — ABNORMAL HIGH (ref 4.0–10.5)
nRBC: 0 % (ref 0.0–0.2)

## 2024-03-16 LAB — KAPPA/LAMBDA LIGHT CHAINS
Kappa free light chain: 13.6 mg/L (ref 3.3–19.4)
Kappa, lambda light chain ratio: 0.76 (ref 0.26–1.65)
Lambda free light chains: 17.9 mg/L (ref 5.7–26.3)

## 2024-03-20 LAB — MULTIPLE MYELOMA PANEL, SERUM
Albumin SerPl Elph-Mcnc: 3.9 g/dL (ref 2.9–4.4)
Albumin/Glob SerPl: 1.2 (ref 0.7–1.7)
Alpha 1: 0.2 g/dL (ref 0.0–0.4)
Alpha2 Glob SerPl Elph-Mcnc: 0.7 g/dL (ref 0.4–1.0)
B-Globulin SerPl Elph-Mcnc: 0.8 g/dL (ref 0.7–1.3)
Gamma Glob SerPl Elph-Mcnc: 1.7 g/dL (ref 0.4–1.8)
Globulin, Total: 3.4 g/dL (ref 2.2–3.9)
IgA: 148 mg/dL (ref 61–437)
IgG (Immunoglobin G), Serum: 1952 mg/dL — ABNORMAL HIGH (ref 603–1613)
IgM (Immunoglobulin M), Srm: 29 mg/dL (ref 20–172)
M Protein SerPl Elph-Mcnc: 1.1 g/dL — ABNORMAL HIGH
Total Protein ELP: 7.3 g/dL (ref 6.0–8.5)

## 2024-03-21 ENCOUNTER — Other Ambulatory Visit: Payer: BC Managed Care – PPO

## 2024-03-23 ENCOUNTER — Inpatient Hospital Stay (HOSPITAL_BASED_OUTPATIENT_CLINIC_OR_DEPARTMENT_OTHER): Payer: PRIVATE HEALTH INSURANCE | Admitting: Hematology and Oncology

## 2024-03-23 VITALS — BP 134/84 | HR 96 | Temp 98.7°F | Resp 18 | Ht 71.0 in | Wt 199.0 lb

## 2024-03-23 DIAGNOSIS — D472 Monoclonal gammopathy: Secondary | ICD-10-CM | POA: Diagnosis not present

## 2024-03-23 NOTE — Progress Notes (Signed)
 Patient Care Team: Cox, Burman Foster, NP as PCP - General (Family Medicine) Christell Constant, MD as PCP - Cardiology (Cardiology)  DIAGNOSIS:  Encounter Diagnosis  Name Primary?   MGUS (monoclonal gammopathy of unknown significance) Yes    CHIEF COMPLIANT: 41-month follow-up of MGUS  HISTORY OF PRESENT ILLNESS: Shawn [name] is a 63 year old gentleman with history of MGUS who is here for 94-month follow-up of elevation of monoclonal protein that was noted by his rheumatologist.  He is here for 73-month follow-up and it appears that his M protein is relatively stable.  He reports no new problems or concerns.  He continues to have problems with hearing.    ALLERGIES:  has no known allergies.  MEDICATIONS:  Current Outpatient Medications  Medication Sig Dispense Refill   B-D 3CC LUER-LOK SYR 21GX1-1/2 21G X 1-1/2" 3 ML MISC USE AS DIRECTED FOR TESTOSTERONE ADMINISTRATION 38 each 1   betamethasone dipropionate 0.05 % cream Apply topically.     clobetasol cream (TEMOVATE) 0.05 % 1 Application.     gabapentin (NEURONTIN) 300 MG capsule Take by mouth. Take 1 tab in the morning and 2 tabs in the evening     metoprolol succinate (TOPROL-XL) 50 MG 24 hr tablet Take 1 tablet (50 mg total) by mouth daily. 90 tablet 3   pantoprazole (PROTONIX) 40 MG tablet TAKE 1 TABLET (40 MG TOTAL) BY MOUTH TWICE A DAY BEFORE MEALS 180 tablet 1   testosterone cypionate (DEPOTESTOSTERONE CYPIONATE) 200 MG/ML injection INJECT 1.5 MLS INTO THE MUSCLE EVERY 14 DAYS. 10 mL 0   No current facility-administered medications for this visit.    PHYSICAL EXAMINATION: ECOG PERFORMANCE STATUS: 1 - Symptomatic but completely ambulatory  Vitals:   03/23/24 1038  BP: 134/84  Pulse: 96  Resp: 18  Temp: 98.7 F (37.1 C)  SpO2: 98%   Filed Weights   03/23/24 1038  Weight: 199 lb (90.3 kg)      LABORATORY DATA:  I have reviewed the data as listed    Latest Ref Rng & Units 03/15/2024   10:52 AM 03/16/2023   11:34  AM 01/18/2023    7:56 AM  CMP  Glucose 70 - 99 mg/dL 010  96  272   BUN 8 - 23 mg/dL 11  10  11    Creatinine 0.61 - 1.24 mg/dL 5.36  6.44  0.34   Sodium 135 - 145 mmol/L 136  139  136   Potassium 3.5 - 5.1 mmol/L 4.1  4.0  4.5   Chloride 98 - 111 mmol/L 100  104  103   CO2 22 - 32 mmol/L 32  30  27   Calcium 8.9 - 10.3 mg/dL 9.5  9.4  8.5   Total Protein 6.5 - 8.1 g/dL 8.1  7.7  7.0   Total Bilirubin 0.0 - 1.2 mg/dL 0.7  0.5  1.1   Alkaline Phos 38 - 126 U/L 50  52  46   AST 15 - 41 U/L 13  14  18    ALT 0 - 44 U/L 16  13  12      Lab Results  Component Value Date   WBC 13.9 (H) 03/15/2024   HGB 14.3 03/15/2024   HCT 43.4 03/15/2024   MCV 91.2 03/15/2024   PLT 352 03/15/2024   NEUTROABS 10.3 (H) 03/15/2024    ASSESSMENT & PLAN:  MGUS (monoclonal gammopathy of unknown significance) Lab review 01/27/2022: Hemoglobin 11.6, creatinine 0.89, calcium 9.4, B12 474, ANA negative, SPEP: 1.1 g  IgG lambda monoclonal protein 02/07/22: Kappa: 23, Lambda 17, ratio: 1.35, Beta 2 microglobulin: 1.9 03/16/2023: Hemoglobin 12.4, M protein: 0.7 g IgG lambda, IgG 1623, kappa 18.9, lambda 23.4, ratio 0.81, creatinine 0.83, albumin 4.2, calcium 9.4 02/11/2024: M protein 1.22 g, creatinine 0.82, albumin 4.5, hemoglobin 13.5 03/15/2024: M Protein: 1.1 gm, Kappa 13.6, Lambda 17.9, Ratio 0.76, Cr 0.89, Hb 14.3   I discussed with the patient that the monoclonal protein is stable. Recheck labs in 1 yr and follow-up after that with a telephone visit      Orders Placed This Encounter  Procedures   CBC with Differential (Cancer Center Only)    Standing Status:   Future    Expiration Date:   03/23/2025   CMP (Cancer Center only)    Standing Status:   Future    Expiration Date:   03/23/2025   Kappa/lambda light chains    Standing Status:   Future    Expiration Date:   03/23/2025   Multiple Myeloma Panel (SPEP&IFE w/QIG)    Standing Status:   Future    Expiration Date:   03/23/2025   Beta 2 microglobulin, serum     Standing Status:   Future    Expiration Date:   03/23/2025   The patient has a good understanding of the overall plan. he agrees with it. he will call with any problems that may develop before the next visit here. Total time spent: 30 mins including face to face time and time spent for planning, charting and co-ordination of care   Shawn Meek, MD 03/23/24

## 2024-03-23 NOTE — Assessment & Plan Note (Signed)
 Lab review 01/27/2022: Hemoglobin 11.6, creatinine 0.89, calcium 9.4, B12 474, ANA negative, SPEP: 1.1 g IgG lambda monoclonal protein 02/07/22: Kappa: 23, Lambda 17, ratio: 1.35, Beta 2 microglobulin: 1.9 03/16/2023: Hemoglobin 12.4, M protein: 0.7 g IgG lambda, IgG 1623, kappa 18.9, lambda 23.4, ratio 0.81, creatinine 0.83, albumin 4.2, calcium 9.4 02/11/2024: M protein 1.22 g, creatinine 0.82, albumin 4.5, hemoglobin 13.5 03/15/2024: M Protein: 1.1 gm, Kappa 13.6, Lambda 17.9, Ratio 0.76, Cr 0.89, Hb 14.3   I discussed with the patient that the monoclonal protein is stable. Recheck labs in 1 yr and follow-up after that with a telephone visit

## 2024-03-25 ENCOUNTER — Other Ambulatory Visit: Payer: Self-pay | Admitting: Urology

## 2024-03-25 ENCOUNTER — Ambulatory Visit (HOSPITAL_BASED_OUTPATIENT_CLINIC_OR_DEPARTMENT_OTHER): Payer: PRIVATE HEALTH INSURANCE | Admitting: General Surgery

## 2024-03-28 ENCOUNTER — Encounter (HOSPITAL_BASED_OUTPATIENT_CLINIC_OR_DEPARTMENT_OTHER): Payer: PRIVATE HEALTH INSURANCE | Attending: General Surgery | Admitting: General Surgery

## 2024-03-28 ENCOUNTER — Ambulatory Visit: Payer: BC Managed Care – PPO | Admitting: Hematology and Oncology

## 2024-03-28 DIAGNOSIS — M3130 Wegener's granulomatosis without renal involvement: Secondary | ICD-10-CM | POA: Insufficient documentation

## 2024-03-28 DIAGNOSIS — I872 Venous insufficiency (chronic) (peripheral): Secondary | ICD-10-CM | POA: Diagnosis not present

## 2024-03-28 DIAGNOSIS — L97812 Non-pressure chronic ulcer of other part of right lower leg with fat layer exposed: Secondary | ICD-10-CM | POA: Insufficient documentation

## 2024-04-24 ENCOUNTER — Other Ambulatory Visit: Payer: Self-pay | Admitting: Urology

## 2024-04-25 ENCOUNTER — Encounter (HOSPITAL_BASED_OUTPATIENT_CLINIC_OR_DEPARTMENT_OTHER): Payer: PRIVATE HEALTH INSURANCE | Attending: General Surgery | Admitting: Internal Medicine

## 2024-04-25 DIAGNOSIS — L97812 Non-pressure chronic ulcer of other part of right lower leg with fat layer exposed: Secondary | ICD-10-CM | POA: Diagnosis present

## 2024-04-25 DIAGNOSIS — I872 Venous insufficiency (chronic) (peripheral): Secondary | ICD-10-CM | POA: Insufficient documentation

## 2024-04-25 DIAGNOSIS — M3131 Wegener's granulomatosis with renal involvement: Secondary | ICD-10-CM | POA: Insufficient documentation

## 2024-05-04 ENCOUNTER — Other Ambulatory Visit: Payer: Self-pay | Admitting: Urology

## 2024-05-16 ENCOUNTER — Encounter (HOSPITAL_BASED_OUTPATIENT_CLINIC_OR_DEPARTMENT_OTHER): Payer: PRIVATE HEALTH INSURANCE | Attending: General Surgery | Admitting: General Surgery

## 2024-05-16 DIAGNOSIS — L97812 Non-pressure chronic ulcer of other part of right lower leg with fat layer exposed: Secondary | ICD-10-CM | POA: Insufficient documentation

## 2024-05-16 DIAGNOSIS — L97512 Non-pressure chronic ulcer of other part of right foot with fat layer exposed: Secondary | ICD-10-CM | POA: Diagnosis not present

## 2024-05-16 DIAGNOSIS — I872 Venous insufficiency (chronic) (peripheral): Secondary | ICD-10-CM | POA: Insufficient documentation

## 2024-05-16 DIAGNOSIS — M3130 Wegener's granulomatosis without renal involvement: Secondary | ICD-10-CM | POA: Insufficient documentation

## 2024-05-31 ENCOUNTER — Other Ambulatory Visit: Payer: Self-pay

## 2024-05-31 DIAGNOSIS — Z125 Encounter for screening for malignant neoplasm of prostate: Secondary | ICD-10-CM

## 2024-05-31 DIAGNOSIS — E349 Endocrine disorder, unspecified: Secondary | ICD-10-CM

## 2024-05-31 DIAGNOSIS — N5201 Erectile dysfunction due to arterial insufficiency: Secondary | ICD-10-CM

## 2024-06-03 ENCOUNTER — Other Ambulatory Visit: Payer: PRIVATE HEALTH INSURANCE

## 2024-06-03 ENCOUNTER — Other Ambulatory Visit: Payer: Self-pay

## 2024-06-03 DIAGNOSIS — E349 Endocrine disorder, unspecified: Secondary | ICD-10-CM

## 2024-06-03 DIAGNOSIS — Z125 Encounter for screening for malignant neoplasm of prostate: Secondary | ICD-10-CM

## 2024-06-03 DIAGNOSIS — N5201 Erectile dysfunction due to arterial insufficiency: Secondary | ICD-10-CM

## 2024-06-04 LAB — PSA: Prostate Specific Ag, Serum: 0.7 ng/mL (ref 0.0–4.0)

## 2024-06-04 LAB — HEMOGLOBIN AND HEMATOCRIT, BLOOD
Hematocrit: 43.3 % (ref 37.5–51.0)
Hemoglobin: 14.4 g/dL (ref 13.0–17.7)

## 2024-06-04 LAB — TESTOSTERONE: Testosterone: 431 ng/dL (ref 264–916)

## 2024-06-08 ENCOUNTER — Ambulatory Visit: Payer: PRIVATE HEALTH INSURANCE | Admitting: Urology

## 2024-06-08 VITALS — BP 128/84 | HR 67 | Ht 73.0 in | Wt 204.0 lb

## 2024-06-08 DIAGNOSIS — E349 Endocrine disorder, unspecified: Secondary | ICD-10-CM

## 2024-06-08 DIAGNOSIS — Z125 Encounter for screening for malignant neoplasm of prostate: Secondary | ICD-10-CM

## 2024-06-08 NOTE — Progress Notes (Unsigned)
 06/04/2023 2:28 PM   Shawn [name] Dec 13, 1961 [id number]  Referring provider: Meredeth Prentice KIDD, MD [address] [place],  [place] [address]  Chief Complaint  Patient presents with   Hypogonadism   Urologic history: 1.  Hypogonadism Tiredness, fatigue, decreased libido Testosterone  cypionate 300 mg every 2 weeks  2.  Erectile dysfunction   HPI: 63 y.o. male presents for follow-up of hypogonadism.  Continues on testosterone . 1.5 cc's Q2 weeks Labs drawn 06/03/2024 (1 day prior to injection): Testosterone  431 ng/dL, PSA 0.7, H/H 85.5/56.6 He notes improvement in his energy level but still having decreased libido and significant ED stating PDE 5 inhibitors only partially effective No bothersome LUTS Since his last visit he has had spinal surgery and a cochlear implant  PMH: Past Medical History:  Diagnosis Date   Alternating constipation and diarrhea    Anal fissure    Anemia    Atrial tachycardia (HCC)    Blind right eye    accident age 61   BMI 30.0-30.9,adult    Chest pain    Dyspnea on exertion    ED (erectile dysfunction)    GERD (gastroesophageal reflux disease)    History of anal fissures    Episodic resulting in hematochezia   History of gastroesophageal reflux (GERD)    Hypertension    Hypogonadism in male    Irregular bowel habits    Kidney stone    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-followed by Dr Revonda   Multiple thyroid  nodules    Onychomycosis    treated with lamisil in the past   Other chronic pain    Pain in right hand    Palpitations    Prediabetes    Pruritus    Retinal detachment    right eye caused blindness   Tinea cruris    Tinnitus    Total retinal detachment of right eye     Surgical History: Past Surgical History:  Procedure Laterality Date   APPENDECTOMY     BACK SURGERY     COLONOSCOPY     COLONOSCOPY WITH ESOPHAGOGASTRODUODENOSCOPY (EGD)  02/23/2023   CYSTOSCOPY W/  URETERAL STENT PLACEMENT  08/24/2012   Procedure: CYSTOSCOPY WITH RETROGRADE PYELOGRAM/URETERAL STENT PLACEMENT;  Surgeon: Arlena LILLETTE Gal, MD;  Location: [facility];  Service: Urology;  Laterality: Right;   CYSTOSCOPY/RETROGRADE/URETEROSCOPY  08/24/2012   Procedure: CYSTOSCOPY/RETROGRADE/URETEROSCOPY;  Surgeon: Arlena LILLETTE Gal, MD;  Location: [facility];  Service: Urology;  Laterality: Right;  Stone Basketing   CYSTOSCOPY/RETROGRADE/URETEROSCOPY/STONE EXTRACTION WITH BASKET Left 04/11/2013   Procedure: CYSTOSCOPY/RETROGRADE/URETEROSCOPY/STONE EXTRACTION WITH BASKET;  Surgeon: Arlena LILLETTE Gal, MD;  Location: [facility];  Service: Urology;  Laterality: Left;  BASKET STONE EXTRACTION WITH URETEROSCOPE     Right retina detachment      Home Medications:  Allergies as of 06/08/2024   No Known Allergies      Medication List        Accurate as of June 08, 2024  2:28 PM. If you have any questions, ask your nurse or doctor.          B-D 3CC LUER-LOK SYR 21GX1-1/2 21G X 1-1/2 3 ML Misc Generic drug: SYRINGE-NEEDLE (DISP) 3 ML USE AS DIRECTED FOR TESTOSTERONE  ADMINISTRATION   B-D 3CC LUER-LOK SYR 23GX1-1/2 23G X 1-1/2 3 ML Misc Generic drug: SYRINGE-NEEDLE (DISP) 3 ML USE AS DIRECTED FOR TESTOSTERONE  INJECTION   betamethasone dipropionate 0.05 % cream Apply topically.   clobetasol cream 0.05 %  Commonly known as: TEMOVATE 1 Application.   gabapentin 300 MG capsule Commonly known as: NEURONTIN Take by mouth. Take 1 tab in the morning and 2 tabs in the evening   methotrexate 2.5 MG tablet Commonly known as: RHEUMATREX Take 2.5 mg by mouth once a week. Caution:Chemotherapy. Protect from light.   metoprolol  succinate 50 MG 24 hr tablet Commonly known as: TOPROL -XL Take 1 tablet (50 mg total) by mouth daily.   pantoprazole  40 MG tablet Commonly known as: PROTONIX  TAKE 1 TABLET (40 MG TOTAL) BY MOUTH TWICE A DAY BEFORE MEALS   tadalafil  5 MG  tablet Commonly known as: CIALIS  Take 5 mg by mouth.   testosterone  cypionate 200 MG/ML injection Commonly known as: DEPOTESTOSTERONE CYPIONATE INJECT 1.5 MLS INTO THE MUSCLE EVERY 14 DAYS.       Family History: Family History  Problem Relation Age of Onset   Diabetes Mother    Stroke Mother    Aneurysm Mother    Cancer Mother    Heart attack Father    Hypertension Father    CAD Father    Heart disease Father    Bladder Cancer Brother    Diabetes Maternal Grandmother    Hypertension Maternal Grandmother    Aneurysm Paternal Grandmother    Heart attack Paternal Grandfather    Hypertension Paternal Grandfather    Thyroid  disease Neg Hx     Social History:  reports that he has quit smoking. His smoking use included pipe. He has never used smokeless tobacco. He reports current alcohol use. He reports that he does not use drugs.   Physical Exam: BP 128/84   Pulse 67   Ht 6' 1 (1.854 m)   Wt 204 lb (92.5 kg)   BMI 26.91 kg/m   Constitutional:  Alert and oriented, No acute distress. HEENT: [place] AT Respiratory: Normal respiratory effort, no increased work of breathing. Psychiatric: Normal mood and affect.  Assessment & Plan:    1.  Hypogonadism Stable Lab visit 6 months, testosterone , H&H Office visit in 1 year with testosterone , hematocrit, and PSA  2.  Erectile dysfunction Decrease efficacy of PDE 5 inhibitors We discussed ICI and he is not interested in pursuing He was given literature on vacuum erection devices   [name], MD  [facility] [address], [address] [place], [place] [address] [phone]

## 2024-06-09 ENCOUNTER — Encounter: Payer: Self-pay | Admitting: Urology

## 2024-06-13 ENCOUNTER — Encounter (HOSPITAL_BASED_OUTPATIENT_CLINIC_OR_DEPARTMENT_OTHER): Payer: PRIVATE HEALTH INSURANCE | Admitting: General Surgery

## 2024-06-13 DIAGNOSIS — L97812 Non-pressure chronic ulcer of other part of right lower leg with fat layer exposed: Secondary | ICD-10-CM | POA: Diagnosis not present

## 2024-06-28 ENCOUNTER — Encounter (HOSPITAL_BASED_OUTPATIENT_CLINIC_OR_DEPARTMENT_OTHER): Payer: PRIVATE HEALTH INSURANCE | Attending: General Surgery | Admitting: General Surgery

## 2024-06-28 DIAGNOSIS — L97812 Non-pressure chronic ulcer of other part of right lower leg with fat layer exposed: Secondary | ICD-10-CM | POA: Insufficient documentation

## 2024-06-28 DIAGNOSIS — I872 Venous insufficiency (chronic) (peripheral): Secondary | ICD-10-CM | POA: Insufficient documentation

## 2024-06-28 DIAGNOSIS — L97512 Non-pressure chronic ulcer of other part of right foot with fat layer exposed: Secondary | ICD-10-CM | POA: Diagnosis not present

## 2024-06-28 DIAGNOSIS — M3130 Wegener's granulomatosis without renal involvement: Secondary | ICD-10-CM | POA: Insufficient documentation

## 2024-06-30 ENCOUNTER — Telehealth: Payer: Self-pay | Admitting: Hematology and Oncology

## 2024-06-30 NOTE — Telephone Encounter (Signed)
 Spoke with patient confirming upcoming appointment changes

## 2024-07-12 ENCOUNTER — Encounter (HOSPITAL_BASED_OUTPATIENT_CLINIC_OR_DEPARTMENT_OTHER): Payer: PRIVATE HEALTH INSURANCE | Admitting: General Surgery

## 2024-07-12 DIAGNOSIS — L97812 Non-pressure chronic ulcer of other part of right lower leg with fat layer exposed: Secondary | ICD-10-CM | POA: Diagnosis not present

## 2024-07-20 ENCOUNTER — Inpatient Hospital Stay: Payer: PRIVATE HEALTH INSURANCE | Attending: Hematology and Oncology

## 2024-07-20 ENCOUNTER — Other Ambulatory Visit: Payer: Self-pay | Admitting: *Deleted

## 2024-07-20 ENCOUNTER — Other Ambulatory Visit: Payer: Self-pay

## 2024-07-20 DIAGNOSIS — D472 Monoclonal gammopathy: Secondary | ICD-10-CM

## 2024-07-20 DIAGNOSIS — Z79899 Other long term (current) drug therapy: Secondary | ICD-10-CM | POA: Insufficient documentation

## 2024-07-20 DIAGNOSIS — Z79631 Long term (current) use of antimetabolite agent: Secondary | ICD-10-CM | POA: Diagnosis not present

## 2024-07-20 LAB — CBC WITH DIFFERENTIAL (CANCER CENTER ONLY)
Abs Immature Granulocytes: 0.45 K/uL — ABNORMAL HIGH (ref 0.00–0.07)
Basophils Absolute: 0.1 K/uL (ref 0.0–0.1)
Basophils Relative: 0 %
Eosinophils Absolute: 0 K/uL (ref 0.0–0.5)
Eosinophils Relative: 0 %
HCT: 41.3 % (ref 39.0–52.0)
Hemoglobin: 14.6 g/dL (ref 13.0–17.0)
Immature Granulocytes: 3 %
Lymphocytes Relative: 6 %
Lymphs Abs: 0.9 K/uL (ref 0.7–4.0)
MCH: 32.5 pg (ref 26.0–34.0)
MCHC: 35.4 g/dL (ref 30.0–36.0)
MCV: 92 fL (ref 80.0–100.0)
Monocytes Absolute: 0.8 K/uL (ref 0.1–1.0)
Monocytes Relative: 5 %
Neutro Abs: 13 K/uL — ABNORMAL HIGH (ref 1.7–7.7)
Neutrophils Relative %: 86 %
Platelet Count: 271 K/uL (ref 150–400)
RBC: 4.49 MIL/uL (ref 4.22–5.81)
RDW: 15.2 % (ref 11.5–15.5)
WBC Count: 15.2 K/uL — ABNORMAL HIGH (ref 4.0–10.5)
nRBC: 0.2 % (ref 0.0–0.2)

## 2024-07-20 LAB — CMP (CANCER CENTER ONLY)
ALT: 17 U/L (ref 0–44)
AST: 10 U/L — ABNORMAL LOW (ref 15–41)
Albumin: 4.4 g/dL (ref 3.5–5.0)
Alkaline Phosphatase: 55 U/L (ref 38–126)
Anion gap: 8 (ref 5–15)
BUN: 19 mg/dL (ref 8–23)
CO2: 28 mmol/L (ref 22–32)
Calcium: 9.4 mg/dL (ref 8.9–10.3)
Chloride: 98 mmol/L (ref 98–111)
Creatinine: 0.94 mg/dL (ref 0.61–1.24)
GFR, Estimated: >60 mL/min (ref >60)
Glucose, Bld: 344 mg/dL — ABNORMAL HIGH (ref 70–99)
Potassium: 4.5 mmol/L (ref 3.5–5.1)
Sodium: 134 mmol/L — ABNORMAL LOW (ref 135–145)
Total Bilirubin: 0.7 mg/dL (ref 0.0–1.2)
Total Protein: 7.2 g/dL (ref 6.5–8.1)

## 2024-07-21 LAB — BETA 2 MICROGLOBULIN, SERUM: Beta-2 Microglobulin: 1.8 mg/L (ref 0.6–2.4)

## 2024-07-21 LAB — KAPPA/LAMBDA LIGHT CHAINS
Kappa free light chain: 9.5 mg/L (ref 3.3–19.4)
Kappa, lambda light chain ratio: 0.79 (ref 0.26–1.65)
Lambda free light chains: 12.1 mg/L (ref 5.7–26.3)

## 2024-07-21 NOTE — Progress Notes (Signed)
 [facility]  - [facility]UROLOG[facility] [place] [address] [place] [address] Dept: [phone]     Date: 07/21/2024                                                           Ordering Provider:  Kiki Imus, MD   Shawn [name]      1961-08-17           [id number]  Mode of Arrival:  ambulatory Transportation: family  Encounter Diagnosis  Name Primary?  . Granulomatosis with polyangiitis of nose    (CMD) Yes    Vitals:   07/21/24 1322 07/21/24 1353 07/21/24 1423 07/21/24 1445  BP: 147/81 131/74 140/74 131/80  BP Location:      Patient Position:      Pulse: 95 94 94 95  Temp:      TempSrc:      SpO2: 98% 97% 100% 98%  Weight:        Administrations This Visit     acetaminophen  (TYLENOL ) tablet 1,000 mg     Admin Date 07/21/2024 Action Given Dose 975 mg Route oral Documented By Alfonso JAYSON Chancy, RN         diphenhydrAMINE  (BENADRYL ) injection 25 mg     Admin Date 07/21/2024 Action Given Dose 25 mg Route intravenous Documented By Alfonso JAYSON Chancy, RN         methylPREDNISolone sodium succinate (PF) (SOLU-Medrol) injection 125 mg     Admin Date 07/21/2024 Action Given Dose 125 mg Route intravenous Documented By Alfonso JAYSON Chancy, RN         riTUXimab-pvvr (RUXIENCE) 1,000 mg in sodium chloride  0.9 % 1,000 mL IVPB     Admin Date 07/21/2024 Action New Bag Dose 1,000 mg Rate 50 mL/hr Route intravenous Documented By Alfonso JAYSON Chancy, RN          Admin Date 07/21/2024 Action Rate/Dose Change (Dual Sign) Dose  Rate 100 mL/hr Route intravenous Documented By Alfonso JAYSON Chancy, RN          Admin Date 07/21/2024 Action Rate/Dose Change (Dual Sign) Dose  Rate 150 mL/hr Route intravenous Documented By Alfonso JAYSON Chancy, RN          Admin Date 07/21/2024 Action Rate/Dose Change (Dual Sign) Dose  Rate 200 mL/hr Route intravenous Documented By Alfonso JAYSON Chancy, RN          Admin  Date 07/21/2024 Action Rate/Dose Change (Dual Sign) Dose  Rate 250 mL/hr Route intravenous Documented By Alfonso JAYSON Chancy, RN          Admin Date 07/21/2024 Action Rate/Dose Change (Dual Sign) Dose  Rate 300 mL/hr Route intravenous Documented By Alfonso JAYSON Chancy, RN          Admin Date 07/21/2024 Action Rate/Dose Change (Dual Sign) Dose  Rate 350 mL/hr Route intravenous Documented By Alfonso JAYSON Chancy, RN          Admin Date 07/21/2024 Action Rate/Dose Change (Dual Sign) Dose  Rate 400 mL/hr Route intravenous Documented By Alfonso JAYSON Chancy, RN             Medication  Ruxience Dose: 1000 mg Wastage: 0 Medication provided by [facility] Infusion   24 gauge angiocatheter  was placed in the left antecubital on the  1st attempt. Blood return observed, catheter was flushed with normal saline without difficulty and is absent of redness, swelling or pain; site secured with dressing and taped appropriately for infusion. Access was removed per protocol after completion of infusion.  The catheter tip was intact.  No redness, swelling or pain noted at the insertion site.  Area was covered with gauze and coban.  Patient tolerated the infusion well.   Patient was discharged ambulatory  Labs collected: none Last Quantiferon was drawn 01/29/2024 and the result was Negative  Infusion Nurse Signature:  Alfonso BROCKS Marrow, RN  Supervision: Camellia Krystal Custard, MD  Infusion start time  1015  Infusion Stop time  1445

## 2024-07-22 LAB — MULTIPLE MYELOMA PANEL, SERUM
Albumin SerPl Elph-Mcnc: 3.9 g/dL (ref 2.9–4.4)
Albumin/Glob SerPl: 1.4 (ref 0.7–1.7)
Alpha 1: 0.2 g/dL (ref 0.0–0.4)
Alpha2 Glob SerPl Elph-Mcnc: 0.6 g/dL (ref 0.4–1.0)
B-Globulin SerPl Elph-Mcnc: 0.9 g/dL (ref 0.7–1.3)
Gamma Glob SerPl Elph-Mcnc: 1.2 g/dL (ref 0.4–1.8)
Globulin, Total: 2.9 g/dL (ref 2.2–3.9)
IgA: 124 mg/dL (ref 61–437)
IgG (Immunoglobin G), Serum: 1409 mg/dL (ref 603–1613)
IgM (Immunoglobulin M), Srm: 26 mg/dL (ref 20–172)
M Protein SerPl Elph-Mcnc: 0.7 g/dL — ABNORMAL HIGH
Total Protein ELP: 6.8 g/dL (ref 6.0–8.5)

## 2024-07-26 ENCOUNTER — Other Ambulatory Visit: Payer: Self-pay | Admitting: Urology

## 2024-07-26 NOTE — Assessment & Plan Note (Signed)
 Lab review 01/27/2022: Hemoglobin 11.6, creatinine 0.89, calcium 9.4, B12 474, ANA negative, SPEP: 1.1 g IgG lambda monoclonal protein 02/07/22: Kappa: 23, Lambda 17, ratio: 1.35, Beta 2 microglobulin: 1.9 03/16/2023: Hemoglobin 12.4, M protein: 0.7 g IgG lambda, IgG 1623, kappa 18.9, lambda 23.4, ratio 0.81, creatinine 0.83, albumin 4.2, calcium 9.4 02/11/2024: M protein 1.22 g, creatinine 0.82, albumin 4.5, hemoglobin 13.5 03/15/2024: M Protein: 1.1 gm, Kappa 13.6, Lambda 17.9, Ratio 0.76, Cr 0.89, Hb 14.3 07/20/24: M Protein 0.7 gm, Kappa 9.5, Lambda: 12.1, Ratio 0.79   I discussed with the patient that the monoclonal protein is stable. Recheck labs in 1 yr and follow-up after that with a telephone visit

## 2024-07-27 ENCOUNTER — Telehealth: Payer: PRIVATE HEALTH INSURANCE | Admitting: Hematology and Oncology

## 2024-07-27 ENCOUNTER — Inpatient Hospital Stay: Payer: PRIVATE HEALTH INSURANCE | Admitting: Hematology and Oncology

## 2024-07-27 DIAGNOSIS — D472 Monoclonal gammopathy: Secondary | ICD-10-CM | POA: Diagnosis not present

## 2024-07-27 NOTE — Progress Notes (Signed)
 Patient Care Team: Cox, Geni LABOR, NP as PCP - General (Family Medicine) Santo Stanly LABOR, MD as PCP - Cardiology (Cardiology)  DIAGNOSIS:  Encounter Diagnosis  Name Primary?   MGUS (monoclonal gammopathy of unknown significance) Yes      CHIEF COMPLIANT: Follow-up of MGUS  HISTORY OF PRESENT ILLNESS:   History of Present Illness Shawn [name] is a 63 year old male with MGUS who presents for routine follow-up of his blood condition. He is accompanied by his sister-in-law, Shawn [name], who assists with communication due to his hearing impairment.  He has monoclonal gammopathy of undetermined significance (MGUS) and is under annual surveillance. Last year, his M protein level was 1.1 grams, and this year it has decreased to 0.7 grams.  He has vasculitis and recently underwent an infusion following blood work.     ALLERGIES:  has no known allergies.  MEDICATIONS:  Current Outpatient Medications  Medication Sig Dispense Refill   B-D 3CC LUER-LOK SYR 21GX1-1/2 21G X 1-1/2 3 ML MISC USE AS DIRECTED FOR TESTOSTERONE  ADMINISTRATION 38 each 1   B-D 3CC LUER-LOK SYR 23GX1-1/2 23G X 1-1/2 3 ML MISC USE AS DIRECTED FOR TESTOSTERONE  INJECTION 24 each 1   betamethasone dipropionate 0.05 % cream Apply topically.     clobetasol cream (TEMOVATE) 0.05 % 1 Application.     gabapentin (NEURONTIN) 300 MG capsule Take by mouth. Take 1 tab in the morning and 2 tabs in the evening     methotrexate (RHEUMATREX) 2.5 MG tablet Take 2.5 mg by mouth once a week. Caution:Chemotherapy. Protect from light.     metoprolol  succinate (TOPROL -XL) 50 MG 24 hr tablet Take 1 tablet (50 mg total) by mouth daily. 90 tablet 3   pantoprazole  (PROTONIX ) 40 MG tablet TAKE 1 TABLET (40 MG TOTAL) BY MOUTH TWICE A DAY BEFORE MEALS 180 tablet 1   tadalafil  (CIALIS ) 5 MG tablet Take 5 mg by mouth.     testosterone  cypionate (DEPOTESTOSTERONE CYPIONATE) 200 MG/ML injection INJECT 1.5 MLS INTO THE MUSCLE EVERY 14  DAYS. 10 mL 0   No current facility-administered medications for this visit.    PHYSICAL EXAMINATION: ECOG PERFORMANCE STATUS: 1 - Symptomatic but completely ambulatory  There were no vitals filed for this visit. There were no vitals filed for this visit.  Physical Exam   (exam performed in the presence of a chaperone)  LABORATORY DATA:  I have reviewed the data as listed    Latest Ref Rng & Units 07/20/2024    2:41 PM 03/15/2024   10:52 AM 03/16/2023   11:34 AM  CMP  Glucose 70 - 99 mg/dL 655  899  96   BUN 8 - 23 mg/dL 19  11  10    Creatinine 0.61 - 1.24 mg/dL 9.05  9.10  9.16   Sodium 135 - 145 mmol/L 134  136  139   Potassium 3.5 - 5.1 mmol/L 4.5  4.1  4.0   Chloride 98 - 111 mmol/L 98  100  104   CO2 22 - 32 mmol/L 28  32  30   Calcium 8.9 - 10.3 mg/dL 9.4  9.5  9.4   Total Protein 6.5 - 8.1 g/dL 7.2  8.1  7.7   Total Bilirubin 0.0 - 1.2 mg/dL 0.7  0.7  0.5   Alkaline Phos 38 - 126 U/L 55  50  52   AST 15 - 41 U/L 10  13  14    ALT 0 - 44 U/L 17  16  13     Lab Results  Component Value Date   WBC 15.2 (H) 07/20/2024   HGB 14.6 07/20/2024   HCT 41.3 07/20/2024   MCV 92.0 07/20/2024   PLT 271 07/20/2024   NEUTROABS 13.0 (H) 07/20/2024    ASSESSMENT & PLAN:  MGUS (monoclonal gammopathy of unknown significance) Lab review 01/27/2022: Hemoglobin 11.6, creatinine 0.89, calcium 9.4, B12 474, ANA negative, SPEP: 1.1 g IgG lambda monoclonal protein 02/07/22: Kappa: 23, Lambda 17, ratio: 1.35, Beta 2 microglobulin: 1.9 03/16/2023: Hemoglobin 12.4, M protein: 0.7 g IgG lambda, IgG 1623, kappa 18.9, lambda 23.4, ratio 0.81, creatinine 0.83, albumin 4.2, calcium 9.4 02/11/2024: M protein 1.22 g, creatinine 0.82, albumin 4.5, hemoglobin 13.5 03/15/2024: M Protein: 1.1 gm, Kappa 13.6, Lambda 17.9, Ratio 0.76, Cr 0.89, Hb 14.3 07/20/24: M Protein 0.7 gm, Kappa 9.5, Lambda: 12.1, Ratio 0.79   I discussed with the patient that the monoclonal protein is stable. Recheck labs in 1 yr and  follow-up after that with a telephone visit   Assessment & Plan Monoclonal gammopathy of undetermined significance (MGUS) MGUS with decreased M protein from 1.1g to 0.7g, atypical but positive. Fluctuation possibly due to vasculitis and infusion therapy. Well-managed, no immediate concerns. - Recheck M protein level in one year. - Schedule follow-up appointment for next year and communicate results a week after labs.      No orders of the defined types were placed in this encounter.  The patient has a good understanding of the overall plan. he agrees with it. he will call with any problems that may develop before the next visit here. Total time spent: 30 mins including face to face time and time spent for planning, charting and co-ordination of care   Shawn MARLA Chad, MD 07/27/24

## 2024-07-29 ENCOUNTER — Other Ambulatory Visit: Payer: Self-pay

## 2024-07-29 ENCOUNTER — Emergency Department (HOSPITAL_COMMUNITY)
Admission: EM | Admit: 2024-07-29 | Discharge: 2024-07-29 | Disposition: A | Discharge status: Home / Self Care | Payer: PRIVATE HEALTH INSURANCE | Attending: Emergency Medicine | Admitting: Emergency Medicine

## 2024-07-29 ENCOUNTER — Encounter (HOSPITAL_COMMUNITY): Payer: Self-pay

## 2024-07-29 DIAGNOSIS — L7622 Postprocedural hemorrhage and hematoma of skin and subcutaneous tissue following other procedure: Secondary | ICD-10-CM | POA: Diagnosis present

## 2024-07-29 DIAGNOSIS — T148XXA Other injury of unspecified body region, initial encounter: Secondary | ICD-10-CM

## 2024-07-29 NOTE — ED Triage Notes (Signed)
 Pt coming in with reports of being treated for vasculitis with and small open wound in right ankle. Pt receiving infusions of Rituximab, last infusion on 8/7. Is normally treated with methotrexate but has been off for past 2 weeks d/t being on doxycycline course  (just completed course). Unknown what doxycycline was for, just that it was related to his vasculitis per patient and spouse.  Patient here today d/t wound spraying and spurting blood this morning. Pt reports significant bleeding before getting it to stop. No bleeding at this time. Dressing changed by writer with pressure dressing placed. Pt ambulating independently with steady gait to triage. Pt reports mild dizziness/lightheadedness.

## 2024-07-29 NOTE — Discharge Instructions (Addendum)
 Follow with rheumatology to continue your infusions.  Elevate leg. If additional bleeding compress the area and hold pressure for 5 min (or wrap tightly as you did).  Return to ER if needed for continued bleeding.

## 2024-07-29 NOTE — ED Provider Notes (Signed)
 [place] EMERGENCY DEPARTMENT AT [facility] Provider Note   CSN: [id number] Arrival date & time: 07/29/24  9446     Patient presents with: Open Wound   Kenyada Hy is a 63 y.o. male.   HPI  Patient is a 63 year old male with a past medical history significant for vasculitic rash follows with rheumatology has received 1 infusion of rituximab so far 1 week ago.  Has had wound to right medial ankle for weeks he states that this morning he had bleeding from this area.  He states that there was blood down the hallway after this incident occurred.  He was able to hold pressure on the area which stopped the bleeding wrapped the area and he came to the emergency room.  He is not actively bleeding.  He states he feels well no shortness of breath lightheadedness or dizziness.  He denies any history of anemia he is not on any anticoagulation. No new injuries.  No fevers or redness or swelling he appreciates    Prior to Admission medications   Medication Sig Start Date End Date Taking? Authorizing Provider  betamethasone dipropionate 0.05 % cream Apply topically. 01/29/24   [provider]  clobetasol cream (TEMOVATE) 0.05 % 1 Application. 02/20/24   [provider]  doxycycline (DORYX) 100 MG EC tablet Take 100 mg by mouth 2 (two) times daily. 07/14/24   [provider]  folic acid (FOLVITE) 1 MG tablet Take 1 mg by mouth daily. 07/01/24   [provider]  gabapentin (NEURONTIN) 300 MG capsule Take by mouth. Take 1 tab in the morning and 2 tabs in the evening 01/22/22   [provider]  HYDROcodone -acetaminophen  (NORCO/VICODIN) 5-325 MG tablet Take 1 tablet by mouth every 8 (eight) hours as needed. 07/12/24   [provider]  methotrexate (RHEUMATREX) 2.5 MG tablet Take 2.5 mg by mouth once a week. Caution:Chemotherapy. Protect from light.    [provider]  metoprolol  succinate (TOPROL -XL) 50 MG 24 hr tablet Take 1 tablet  (50 mg total) by mouth daily. 10/24/22   Chandrasekhar, Stanly LABOR, MD  mupirocin ointment (BACTROBAN) 2 % Apply 1 Application topically 2 (two) times daily. To nostrils 06/21/24   [provider]  pantoprazole  (PROTONIX ) 40 MG tablet TAKE 1 TABLET (40 MG TOTAL) BY MOUTH TWICE A DAY BEFORE MEALS 10/16/23   Nandigam, Kavitha V, MD  predniSONE  (DELTASONE ) 10 MG tablet Take 40 mg by mouth daily. 06/30/24   [provider]  tadalafil  (CIALIS ) 5 MG tablet Take 5 mg by mouth. 12/04/22   [provider]  testosterone  cypionate (DEPOTESTOSTERONE CYPIONATE) 200 MG/ML injection INJECT 1.5 MLS INTO THE MUSCLE EVERY 14 DAYS *DISCARD REMAINDER OF OPEN VIAL AFTER EACH DOSE* 07/28/24   Stoioff, Glendia BROCKS, MD    Allergies: Patient has no known allergies.    Review of Systems  Updated Vital Signs BP 112/82 (BP Location: Left Arm)   Pulse 93   Temp 97.9 F (36.6 C) (Oral)   Resp 16   Ht 6' (1.829 m)   Wt 92.5 kg   SpO2 96%   BMI 27.67 kg/m   Physical Exam Vitals and nursing note reviewed.  Constitutional:      General: He is not in acute distress.    Appearance: Normal appearance. He is not ill-appearing.  HENT:     Head: Normocephalic and atraumatic.  Eyes:     General: No scleral icterus.       Right eye: No discharge.  Left eye: No discharge.     Conjunctiva/sclera: Conjunctivae normal.  Pulmonary:     Effort: Pulmonary effort is normal.     Breath sounds: No stridor.  Musculoskeletal:        General: Normal range of motion.     Comments: DP PT pulses 3+ and symmetric, sensation normal in bilateral feet able to wiggle toes forage motion of ankle and knees  Skin:    General: Skin is warm and dry.     Comments: Ulcer to right ankle superior to right medial malleolus.  No surrounding erythema, no active bleeding, no purulence  Neurological:     Mental Status: He is alert and oriented to person, place, and time. Mental status is at baseline.     (all labs  ordered are listed, but only abnormal results are displayed) Labs Reviewed - No data to display  EKG: None  Radiology: No results found.   Procedures   Medications Ordered in the ED - No data to display                                  Medical Decision Making   Patient is a 63 year old male with a past medical history significant for vasculitic rash follows with rheumatology has received 1 infusion of rituximab so far 1 week ago.  Has had wound to right medial ankle for weeks he states that this morning he had bleeding from this area.  He states that there was blood down the hallway after this incident occurred.  He was able to hold pressure on the area which stopped the bleeding wrapped the area and he came to the emergency room.  He is not actively bleeding.  He states he feels well no shortness of breath lightheadedness or dizziness.  He denies any history of anemia he is not on any anticoagulation. No new injuries.  No fevers or redness or swelling he appreciates  Patient with ulcer to right ankle he is being followed by rheumatology and wound care.  Infusions of rituximab to address the vasculitis the patient suffers from. He is distally neurovascularly intact, good perfusion to toes.  No active bleeding.  Gave wound care instructions, offered to obtain H&H and after shared decision making or position patient states he would prefer to just be discharged home at this time.  He will return to the ER for any new or concerning symptoms.  Any lightheadedness or shortness of breath.  Otherwise will follow-up with wound care and rheumatology for continued infusions.  He is not on any anticoagulation.  No trauma to his ankle and no indication for imaging today.   Final diagnoses:  Bleeding from wound    ED Discharge Orders     None          Neldon Hamp RAMAN, [place] 07/29/24 9176    Laurice Maude BROCKS, MD 07/29/24 [phone]

## 2024-08-02 ENCOUNTER — Encounter (HOSPITAL_BASED_OUTPATIENT_CLINIC_OR_DEPARTMENT_OTHER): Payer: PRIVATE HEALTH INSURANCE | Attending: General Surgery | Admitting: General Surgery

## 2024-08-02 DIAGNOSIS — I872 Venous insufficiency (chronic) (peripheral): Secondary | ICD-10-CM | POA: Diagnosis not present

## 2024-08-02 DIAGNOSIS — M3130 Wegener's granulomatosis without renal involvement: Secondary | ICD-10-CM | POA: Diagnosis not present

## 2024-08-02 DIAGNOSIS — L97812 Non-pressure chronic ulcer of other part of right lower leg with fat layer exposed: Secondary | ICD-10-CM | POA: Diagnosis present

## 2024-08-02 DIAGNOSIS — L97512 Non-pressure chronic ulcer of other part of right foot with fat layer exposed: Secondary | ICD-10-CM | POA: Insufficient documentation

## 2024-08-09 ENCOUNTER — Encounter (HOSPITAL_BASED_OUTPATIENT_CLINIC_OR_DEPARTMENT_OTHER): Payer: PRIVATE HEALTH INSURANCE | Admitting: General Surgery

## 2024-08-30 ENCOUNTER — Encounter (HOSPITAL_BASED_OUTPATIENT_CLINIC_OR_DEPARTMENT_OTHER): Payer: PRIVATE HEALTH INSURANCE | Attending: General Surgery | Admitting: General Surgery

## 2024-08-30 DIAGNOSIS — I872 Venous insufficiency (chronic) (peripheral): Secondary | ICD-10-CM | POA: Diagnosis not present

## 2024-08-30 DIAGNOSIS — L97512 Non-pressure chronic ulcer of other part of right foot with fat layer exposed: Secondary | ICD-10-CM | POA: Diagnosis not present

## 2024-08-30 DIAGNOSIS — M3130 Wegener's granulomatosis without renal involvement: Secondary | ICD-10-CM | POA: Insufficient documentation

## 2024-08-30 DIAGNOSIS — L97812 Non-pressure chronic ulcer of other part of right lower leg with fat layer exposed: Secondary | ICD-10-CM | POA: Diagnosis present

## 2024-09-21 NOTE — Telephone Encounter (Signed)
 Patient has an appt with Dr. Patrcia tomorrow (10/9) to discuss his sugars.  I verbally spoke to Dr. Patrcia and since patient has appt tomorrow, he will discuss this with patient and will send in medication tomorrow during the visit.  Nothing needs to be done with this message.

## 2024-09-27 ENCOUNTER — Encounter (HOSPITAL_BASED_OUTPATIENT_CLINIC_OR_DEPARTMENT_OTHER): Payer: PRIVATE HEALTH INSURANCE | Attending: General Surgery | Admitting: General Surgery

## 2024-09-27 DIAGNOSIS — I872 Venous insufficiency (chronic) (peripheral): Secondary | ICD-10-CM | POA: Insufficient documentation

## 2024-09-27 DIAGNOSIS — M3130 Wegener's granulomatosis without renal involvement: Secondary | ICD-10-CM | POA: Diagnosis not present

## 2024-09-27 DIAGNOSIS — L97512 Non-pressure chronic ulcer of other part of right foot with fat layer exposed: Secondary | ICD-10-CM | POA: Diagnosis not present

## 2024-09-27 DIAGNOSIS — L97815 Non-pressure chronic ulcer of other part of right lower leg with muscle involvement without evidence of necrosis: Secondary | ICD-10-CM | POA: Diagnosis present

## 2024-09-27 DIAGNOSIS — E11622 Type 2 diabetes mellitus with other skin ulcer: Secondary | ICD-10-CM | POA: Insufficient documentation

## 2024-09-27 DIAGNOSIS — E11621 Type 2 diabetes mellitus with foot ulcer: Secondary | ICD-10-CM | POA: Diagnosis not present

## 2024-10-11 ENCOUNTER — Encounter (HOSPITAL_BASED_OUTPATIENT_CLINIC_OR_DEPARTMENT_OTHER): Payer: PRIVATE HEALTH INSURANCE | Admitting: General Surgery

## 2024-10-11 DIAGNOSIS — E11621 Type 2 diabetes mellitus with foot ulcer: Secondary | ICD-10-CM | POA: Diagnosis not present

## 2024-10-25 ENCOUNTER — Encounter (HOSPITAL_BASED_OUTPATIENT_CLINIC_OR_DEPARTMENT_OTHER): Payer: PRIVATE HEALTH INSURANCE | Attending: General Surgery | Admitting: General Surgery

## 2024-10-25 DIAGNOSIS — I872 Venous insufficiency (chronic) (peripheral): Secondary | ICD-10-CM | POA: Insufficient documentation

## 2024-10-25 DIAGNOSIS — L97815 Non-pressure chronic ulcer of other part of right lower leg with muscle involvement without evidence of necrosis: Secondary | ICD-10-CM | POA: Diagnosis not present

## 2024-10-25 DIAGNOSIS — L97512 Non-pressure chronic ulcer of other part of right foot with fat layer exposed: Secondary | ICD-10-CM | POA: Insufficient documentation

## 2024-10-25 DIAGNOSIS — M3130 Wegener's granulomatosis without renal involvement: Secondary | ICD-10-CM | POA: Insufficient documentation

## 2024-10-25 DIAGNOSIS — E11622 Type 2 diabetes mellitus with other skin ulcer: Secondary | ICD-10-CM | POA: Diagnosis not present

## 2024-10-25 DIAGNOSIS — E11621 Type 2 diabetes mellitus with foot ulcer: Secondary | ICD-10-CM | POA: Insufficient documentation

## 2024-11-01 ENCOUNTER — Encounter (HOSPITAL_BASED_OUTPATIENT_CLINIC_OR_DEPARTMENT_OTHER): Payer: PRIVATE HEALTH INSURANCE | Admitting: General Surgery

## 2024-11-01 DIAGNOSIS — E11621 Type 2 diabetes mellitus with foot ulcer: Secondary | ICD-10-CM | POA: Diagnosis not present

## 2024-11-08 ENCOUNTER — Encounter (HOSPITAL_BASED_OUTPATIENT_CLINIC_OR_DEPARTMENT_OTHER): Payer: PRIVATE HEALTH INSURANCE | Admitting: General Surgery

## 2024-11-08 DIAGNOSIS — E11621 Type 2 diabetes mellitus with foot ulcer: Secondary | ICD-10-CM | POA: Diagnosis not present

## 2024-11-15 ENCOUNTER — Encounter (HOSPITAL_BASED_OUTPATIENT_CLINIC_OR_DEPARTMENT_OTHER): Payer: PRIVATE HEALTH INSURANCE | Admitting: General Surgery

## 2024-11-15 DIAGNOSIS — I872 Venous insufficiency (chronic) (peripheral): Secondary | ICD-10-CM | POA: Diagnosis not present

## 2024-11-15 DIAGNOSIS — L97512 Non-pressure chronic ulcer of other part of right foot with fat layer exposed: Secondary | ICD-10-CM | POA: Insufficient documentation

## 2024-11-15 DIAGNOSIS — E11622 Type 2 diabetes mellitus with other skin ulcer: Secondary | ICD-10-CM | POA: Diagnosis not present

## 2024-11-15 DIAGNOSIS — E11621 Type 2 diabetes mellitus with foot ulcer: Secondary | ICD-10-CM | POA: Diagnosis present

## 2024-11-15 DIAGNOSIS — L97815 Non-pressure chronic ulcer of other part of right lower leg with muscle involvement without evidence of necrosis: Secondary | ICD-10-CM | POA: Insufficient documentation

## 2024-11-15 DIAGNOSIS — M3130 Wegener's granulomatosis without renal involvement: Secondary | ICD-10-CM | POA: Insufficient documentation

## 2024-11-22 ENCOUNTER — Encounter (HOSPITAL_BASED_OUTPATIENT_CLINIC_OR_DEPARTMENT_OTHER): Payer: PRIVATE HEALTH INSURANCE | Admitting: Internal Medicine

## 2024-11-22 DIAGNOSIS — L97512 Non-pressure chronic ulcer of other part of right foot with fat layer exposed: Secondary | ICD-10-CM | POA: Diagnosis not present

## 2024-11-22 DIAGNOSIS — L97815 Non-pressure chronic ulcer of other part of right lower leg with muscle involvement without evidence of necrosis: Secondary | ICD-10-CM

## 2024-11-22 DIAGNOSIS — E11621 Type 2 diabetes mellitus with foot ulcer: Secondary | ICD-10-CM | POA: Diagnosis not present

## 2024-11-29 ENCOUNTER — Encounter (HOSPITAL_BASED_OUTPATIENT_CLINIC_OR_DEPARTMENT_OTHER): Payer: PRIVATE HEALTH INSURANCE | Admitting: General Surgery

## 2024-11-29 DIAGNOSIS — E11621 Type 2 diabetes mellitus with foot ulcer: Secondary | ICD-10-CM | POA: Diagnosis not present

## 2024-12-02 ENCOUNTER — Other Ambulatory Visit: Payer: Self-pay | Admitting: Urology

## 2024-12-05 ENCOUNTER — Other Ambulatory Visit: Payer: PRIVATE HEALTH INSURANCE

## 2024-12-05 DIAGNOSIS — E349 Endocrine disorder, unspecified: Secondary | ICD-10-CM

## 2024-12-06 ENCOUNTER — Encounter (HOSPITAL_BASED_OUTPATIENT_CLINIC_OR_DEPARTMENT_OTHER): Payer: PRIVATE HEALTH INSURANCE | Admitting: General Surgery

## 2024-12-06 LAB — TESTOSTERONE: Testosterone: 815 ng/dL (ref 264–916)

## 2024-12-06 LAB — HEMOGLOBIN AND HEMATOCRIT, BLOOD
Hematocrit: 47.3 % (ref 37.5–51.0)
Hemoglobin: 15.9 g/dL (ref 13.0–17.7)

## 2024-12-13 ENCOUNTER — Encounter (HOSPITAL_BASED_OUTPATIENT_CLINIC_OR_DEPARTMENT_OTHER): Payer: PRIVATE HEALTH INSURANCE | Admitting: General Surgery

## 2024-12-20 ENCOUNTER — Encounter (HOSPITAL_BASED_OUTPATIENT_CLINIC_OR_DEPARTMENT_OTHER): Attending: General Surgery | Admitting: General Surgery

## 2024-12-20 DIAGNOSIS — I872 Venous insufficiency (chronic) (peripheral): Secondary | ICD-10-CM | POA: Insufficient documentation

## 2024-12-20 DIAGNOSIS — E11622 Type 2 diabetes mellitus with other skin ulcer: Secondary | ICD-10-CM | POA: Diagnosis present

## 2024-12-20 DIAGNOSIS — L97815 Non-pressure chronic ulcer of other part of right lower leg with muscle involvement without evidence of necrosis: Secondary | ICD-10-CM | POA: Insufficient documentation

## 2024-12-27 ENCOUNTER — Encounter (HOSPITAL_BASED_OUTPATIENT_CLINIC_OR_DEPARTMENT_OTHER): Admitting: General Surgery

## 2024-12-27 DIAGNOSIS — E11622 Type 2 diabetes mellitus with other skin ulcer: Secondary | ICD-10-CM | POA: Diagnosis not present

## 2025-01-03 ENCOUNTER — Encounter (HOSPITAL_BASED_OUTPATIENT_CLINIC_OR_DEPARTMENT_OTHER): Admitting: General Surgery

## 2025-01-03 DIAGNOSIS — E11622 Type 2 diabetes mellitus with other skin ulcer: Secondary | ICD-10-CM | POA: Diagnosis not present

## 2025-01-10 ENCOUNTER — Inpatient Hospital Stay (HOSPITAL_COMMUNITY)
Admission: EM | Admit: 2025-01-10 | Discharge: 2025-01-14 | DRG: 308 | Disposition: A | Discharge status: Home / Self Care | Attending: Internal Medicine | Admitting: Internal Medicine

## 2025-01-10 ENCOUNTER — Other Ambulatory Visit: Payer: Self-pay

## 2025-01-10 ENCOUNTER — Emergency Department (HOSPITAL_COMMUNITY)

## 2025-01-10 ENCOUNTER — Encounter (HOSPITAL_BASED_OUTPATIENT_CLINIC_OR_DEPARTMENT_OTHER): Admitting: General Surgery

## 2025-01-10 DIAGNOSIS — I4892 Unspecified atrial flutter: Principal | ICD-10-CM | POA: Diagnosis present

## 2025-01-10 DIAGNOSIS — R300 Dysuria: Secondary | ICD-10-CM

## 2025-01-10 DIAGNOSIS — N390 Urinary tract infection, site not specified: Secondary | ICD-10-CM | POA: Clinically undetermined

## 2025-01-10 DIAGNOSIS — E079 Disorder of thyroid, unspecified: Secondary | ICD-10-CM

## 2025-01-10 DIAGNOSIS — Z01810 Encounter for preprocedural cardiovascular examination: Secondary | ICD-10-CM

## 2025-01-10 DIAGNOSIS — H903 Sensorineural hearing loss, bilateral: Secondary | ICD-10-CM

## 2025-01-10 DIAGNOSIS — K920 Hematemesis: Secondary | ICD-10-CM | POA: Diagnosis present

## 2025-01-10 DIAGNOSIS — R350 Frequency of micturition: Secondary | ICD-10-CM

## 2025-01-10 DIAGNOSIS — K253 Acute gastric ulcer without hemorrhage or perforation: Secondary | ICD-10-CM

## 2025-01-10 DIAGNOSIS — I10 Essential (primary) hypertension: Secondary | ICD-10-CM | POA: Diagnosis present

## 2025-01-10 DIAGNOSIS — E119 Type 2 diabetes mellitus without complications: Secondary | ICD-10-CM

## 2025-01-10 DIAGNOSIS — R7989 Other specified abnormal findings of blood chemistry: Secondary | ICD-10-CM

## 2025-01-10 DIAGNOSIS — M3130 Wegener's granulomatosis without renal involvement: Secondary | ICD-10-CM

## 2025-01-10 DIAGNOSIS — R112 Nausea with vomiting, unspecified: Secondary | ICD-10-CM

## 2025-01-10 DIAGNOSIS — K921 Melena: Secondary | ICD-10-CM

## 2025-01-10 LAB — CBC WITH DIFFERENTIAL/PLATELET
Abs Immature Granulocytes: 0.11 10*3/uL — ABNORMAL HIGH (ref 0.00–0.07)
Basophils Absolute: 0.1 10*3/uL (ref 0.0–0.1)
Basophils Relative: 0 %
Eosinophils Absolute: 0.1 10*3/uL (ref 0.0–0.5)
Eosinophils Relative: 0 %
HCT: 52.3 % — ABNORMAL HIGH (ref 39.0–52.0)
Hemoglobin: 17.3 g/dL — ABNORMAL HIGH (ref 13.0–17.0)
Immature Granulocytes: 1 %
Lymphocytes Relative: 4 %
Lymphs Abs: 0.8 10*3/uL (ref 0.7–4.0)
MCH: 31.6 pg (ref 26.0–34.0)
MCHC: 33.1 g/dL (ref 30.0–36.0)
MCV: 95.6 fL (ref 80.0–100.0)
Monocytes Absolute: 1.3 10*3/uL — ABNORMAL HIGH (ref 0.1–1.0)
Monocytes Relative: 7 %
Neutro Abs: 15.9 10*3/uL — ABNORMAL HIGH (ref 1.7–7.7)
Neutrophils Relative %: 88 %
Platelets: 339 10*3/uL (ref 150–400)
RBC: 5.47 MIL/uL (ref 4.22–5.81)
RDW: 15 % (ref 11.5–15.5)
WBC: 18.2 10*3/uL — ABNORMAL HIGH (ref 4.0–10.5)
nRBC: 0 % (ref 0.0–0.2)

## 2025-01-10 LAB — COMPREHENSIVE METABOLIC PANEL WITH GFR
ALT: 19 U/L (ref 0–44)
AST: 21 U/L (ref 15–41)
Albumin: 4.6 g/dL (ref 3.5–5.0)
Alkaline Phosphatase: 57 U/L (ref 38–126)
Anion gap: 13 (ref 5–15)
BUN: 20 mg/dL (ref 8–23)
CO2: 21 mmol/L — ABNORMAL LOW (ref 22–32)
Calcium: 9.4 mg/dL (ref 8.9–10.3)
Chloride: 97 mmol/L — ABNORMAL LOW (ref 98–111)
Creatinine, Ser: 0.93 mg/dL (ref 0.61–1.24)
GFR, Estimated: >60 mL/min
Glucose, Bld: 216 mg/dL — ABNORMAL HIGH (ref 70–99)
Potassium: 4.7 mmol/L (ref 3.5–5.1)
Sodium: 131 mmol/L — ABNORMAL LOW (ref 135–145)
Total Bilirubin: 1.6 mg/dL — ABNORMAL HIGH (ref 0.0–1.2)
Total Protein: 7.8 g/dL (ref 6.5–8.1)

## 2025-01-10 LAB — URINALYSIS, ROUTINE W REFLEX MICROSCOPIC
Bacteria, UA: NONE SEEN
Bilirubin Urine: NEGATIVE
Glucose, UA: NEGATIVE mg/dL
Hgb urine dipstick: NEGATIVE
Ketones, ur: 20 mg/dL — AB
Leukocytes,Ua: NEGATIVE
Nitrite: NEGATIVE
Protein, ur: 30 mg/dL — AB
Specific Gravity, Urine: 1.021 (ref 1.005–1.030)
pH: 5 (ref 5.0–8.0)

## 2025-01-10 LAB — I-STAT CG4 LACTIC ACID, ED: Lactic Acid, Venous: 1.6 mmol/L (ref 0.5–1.9)

## 2025-01-10 LAB — TROPONIN T, HIGH SENSITIVITY
Troponin T High Sensitivity: 24 ng/L — ABNORMAL HIGH (ref 0–19)
Troponin T High Sensitivity: 38 ng/L — ABNORMAL HIGH (ref 0–19)

## 2025-01-10 LAB — LIPASE, BLOOD: Lipase: 69 U/L — ABNORMAL HIGH (ref 11–51)

## 2025-01-10 LAB — GLUCOSE, CAPILLARY: Glucose-Capillary: 139 mg/dL — ABNORMAL HIGH (ref 70–99)

## 2025-01-10 MED ORDER — METOCLOPRAMIDE HCL 5 MG/ML IJ SOLN
10.0000 mg | Freq: Once | INTRAMUSCULAR | Status: AC
  Administered 2025-01-10: 10 mg via INTRAVENOUS
  Filled 2025-01-10: qty 2

## 2025-01-10 MED ORDER — HEPARIN (PORCINE) 25000 UT/250ML-% IV SOLN
1850.0000 [IU]/h | INTRAVENOUS | Status: AC
  Administered 2025-01-11: 1100 [IU]/h via INTRAVENOUS
  Filled 2025-01-10 (×2): qty 250

## 2025-01-10 MED ORDER — SODIUM CHLORIDE 0.9 % IV BOLUS
1000.0000 mL | Freq: Once | INTRAVENOUS | Status: AC
  Administered 2025-01-10: 1000 mL via INTRAVENOUS

## 2025-01-10 MED ORDER — IOHEXOL 300 MG/ML  SOLN
100.0000 mL | Freq: Once | INTRAMUSCULAR | Status: AC | PRN
  Administered 2025-01-10: 100 mL via INTRAVENOUS

## 2025-01-10 MED ORDER — ONDANSETRON HCL 4 MG/2ML IJ SOLN
4.0000 mg | Freq: Once | INTRAMUSCULAR | Status: AC
  Administered 2025-01-10: 4 mg via INTRAVENOUS
  Filled 2025-01-10: qty 2

## 2025-01-10 MED ORDER — PANTOPRAZOLE SODIUM 40 MG IV SOLR
40.0000 mg | Freq: Once | INTRAVENOUS | Status: AC
  Administered 2025-01-10: 40 mg via INTRAVENOUS
  Filled 2025-01-10: qty 10

## 2025-01-10 MED ORDER — HEPARIN BOLUS VIA INFUSION
2000.0000 [IU] | Freq: Once | INTRAVENOUS | Status: AC
  Administered 2025-01-11: 2000 [IU] via INTRAVENOUS
  Filled 2025-01-10: qty 2000

## 2025-01-10 MED ORDER — PANTOPRAZOLE SODIUM 40 MG IV SOLR
40.0000 mg | Freq: Two times a day (BID) | INTRAVENOUS | Status: DC
  Administered 2025-01-11 – 2025-01-14 (×8): 40 mg via INTRAVENOUS
  Filled 2025-01-10 (×8): qty 10

## 2025-01-10 NOTE — ED Notes (Signed)
 Patient tolerated P.O. Challenged. Denies N/V.

## 2025-01-10 NOTE — ED Triage Notes (Signed)
 Patient BIB EMS from home with generalized abdominal pain starting this morning, associated with multiple episodes of nausea and vomiting. Denies fever, dysuria, chest pain, or shortness of breath.  BP 140/70 HR 110 RR 20 O2sat 99% on RA CBG 112

## 2025-01-10 NOTE — Progress Notes (Signed)
 PHARMACY - ANTICOAGULATION CONSULT NOTE  Pharmacy Consult for Heparin  Indication: atrial fibrillation  Allergies[1]  Patient Measurements:    Vital Signs: Temp: 98.4 F (36.9 C) (01/27 2317) Temp Source: Oral (01/27 2317) BP: 124/88 (01/27 2317) Pulse Rate: 85 (01/27 2317)  Labs: Recent Labs    01/10/25 1659  HGB 17.3*  HCT 52.3*  PLT 339  CREATININE 0.93    CrCl cannot be calculated (Unknown ideal weight.).   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  Not on anticoagulation PTA.  Pharmacy consulted to dose IV heparin .   Of note, he also reports hematemesis today.  He does not have any significant GI history, however GI is being consulted for possible esophagitis vs Mallory-Weiss tear per MD note. Also s/p wound and nasal debridement ~ 1 week ago.  Baseline CBC- Hg, pltc WNL. Scr WNL.  TBili slightly elevated although AST/ALT WNL.   Goal of Therapy:  Heparin  level 0.3-0.7 units/ml Monitor platelets by anticoagulation  protocol: Yes   Plan:  Heparin  2000 units IV bolus x1 (1/2 bolus due to concern for bleed) Initiate heparin  infusion at 1100 units/hr  Check heparin  level ~6h after heparin  infusion starts Daily heparin  level & CBC while on heparin   Rosaline Millet PharmD 01/10/2025,11:21 PM      [1] No Known Allergies

## 2025-01-11 ENCOUNTER — Encounter (HOSPITAL_COMMUNITY): Payer: Self-pay | Admitting: Internal Medicine

## 2025-01-11 ENCOUNTER — Observation Stay (HOSPITAL_COMMUNITY)

## 2025-01-11 DIAGNOSIS — Z792 Long term (current) use of antibiotics: Secondary | ICD-10-CM

## 2025-01-11 DIAGNOSIS — D472 Monoclonal gammopathy: Secondary | ICD-10-CM | POA: Diagnosis not present

## 2025-01-11 DIAGNOSIS — Z7952 Long term (current) use of systemic steroids: Secondary | ICD-10-CM

## 2025-01-11 DIAGNOSIS — K625 Hemorrhage of anus and rectum: Secondary | ICD-10-CM

## 2025-01-11 DIAGNOSIS — I4892 Unspecified atrial flutter: Secondary | ICD-10-CM

## 2025-01-11 DIAGNOSIS — I10 Essential (primary) hypertension: Secondary | ICD-10-CM | POA: Diagnosis not present

## 2025-01-11 DIAGNOSIS — R197 Diarrhea, unspecified: Secondary | ICD-10-CM

## 2025-01-11 DIAGNOSIS — M3130 Wegener's granulomatosis without renal involvement: Secondary | ICD-10-CM | POA: Diagnosis not present

## 2025-01-11 DIAGNOSIS — R112 Nausea with vomiting, unspecified: Secondary | ICD-10-CM | POA: Insufficient documentation

## 2025-01-11 DIAGNOSIS — K219 Gastro-esophageal reflux disease without esophagitis: Secondary | ICD-10-CM

## 2025-01-11 DIAGNOSIS — Z79631 Long term (current) use of antimetabolite agent: Secondary | ICD-10-CM

## 2025-01-11 DIAGNOSIS — H903 Sensorineural hearing loss, bilateral: Secondary | ICD-10-CM | POA: Diagnosis not present

## 2025-01-11 DIAGNOSIS — Z01810 Encounter for preprocedural cardiovascular examination: Secondary | ICD-10-CM | POA: Diagnosis not present

## 2025-01-11 DIAGNOSIS — K920 Hematemesis: Secondary | ICD-10-CM | POA: Insufficient documentation

## 2025-01-11 DIAGNOSIS — E119 Type 2 diabetes mellitus without complications: Secondary | ICD-10-CM | POA: Diagnosis not present

## 2025-01-11 DIAGNOSIS — K921 Melena: Secondary | ICD-10-CM | POA: Diagnosis not present

## 2025-01-11 LAB — HEPARIN LEVEL (UNFRACTIONATED)
Heparin Unfractionated: <0.1 [IU]/mL — ABNORMAL LOW (ref 0.30–0.70)
Heparin Unfractionated: <0.1 [IU]/mL — ABNORMAL LOW (ref 0.30–0.70)
Heparin Unfractionated: 0.23 [IU]/mL — ABNORMAL LOW (ref 0.30–0.70)

## 2025-01-11 LAB — CBC
HCT: 45.4 % (ref 39.0–52.0)
Hemoglobin: 14.9 g/dL (ref 13.0–17.0)
MCH: 32.1 pg (ref 26.0–34.0)
MCHC: 32.8 g/dL (ref 30.0–36.0)
MCV: 97.8 fL (ref 80.0–100.0)
Platelets: 269 10*3/uL (ref 150–400)
RBC: 4.64 MIL/uL (ref 4.22–5.81)
RDW: 15.2 % (ref 11.5–15.5)
WBC: 10.7 10*3/uL — ABNORMAL HIGH (ref 4.0–10.5)
nRBC: 0 % (ref 0.0–0.2)

## 2025-01-11 LAB — PHOSPHORUS: Phosphorus: 2.9 mg/dL (ref 2.5–4.6)

## 2025-01-11 LAB — GLUCOSE, CAPILLARY
Glucose-Capillary: 101 mg/dL — ABNORMAL HIGH (ref 70–99)
Glucose-Capillary: 115 mg/dL — ABNORMAL HIGH (ref 70–99)
Glucose-Capillary: 116 mg/dL — ABNORMAL HIGH (ref 70–99)
Glucose-Capillary: 118 mg/dL — ABNORMAL HIGH (ref 70–99)
Glucose-Capillary: 132 mg/dL — ABNORMAL HIGH (ref 70–99)
Glucose-Capillary: 155 mg/dL — ABNORMAL HIGH (ref 70–99)
Glucose-Capillary: 95 mg/dL (ref 70–99)

## 2025-01-11 LAB — T4, FREE: Free T4: 1.43 ng/dL (ref 0.80–2.00)

## 2025-01-11 LAB — BASIC METABOLIC PANEL WITH GFR
Anion gap: 10 (ref 5–15)
BUN: 14 mg/dL (ref 8–23)
CO2: 24 mmol/L (ref 22–32)
Calcium: 8.4 mg/dL — ABNORMAL LOW (ref 8.9–10.3)
Chloride: 104 mmol/L (ref 98–111)
Creatinine, Ser: 0.92 mg/dL (ref 0.61–1.24)
GFR, Estimated: >60 mL/min
Glucose, Bld: 95 mg/dL (ref 70–99)
Potassium: 4.3 mmol/L (ref 3.5–5.1)
Sodium: 137 mmol/L (ref 135–145)

## 2025-01-11 LAB — ECHOCARDIOGRAM COMPLETE
Height: 74 in
S' Lateral: 3.6 cm
Weight: 3272 [oz_av]

## 2025-01-11 LAB — MAGNESIUM: Magnesium: 2.2 mg/dL (ref 1.7–2.4)

## 2025-01-11 LAB — TSH: TSH: 0.171 u[IU]/mL — ABNORMAL LOW (ref 0.350–4.500)

## 2025-01-11 MED ORDER — SACCHAROMYCES BOULARDII 250 MG PO CAPS
250.0000 mg | ORAL_CAPSULE | Freq: Two times a day (BID) | ORAL | Status: DC
  Administered 2025-01-11 – 2025-01-14 (×7): 250 mg via ORAL
  Filled 2025-01-11 (×7): qty 1

## 2025-01-11 MED ORDER — INSULIN ASPART 100 UNIT/ML IJ SOLN
0.0000 [IU] | INTRAMUSCULAR | Status: DC
  Administered 2025-01-11: 2 [IU] via SUBCUTANEOUS
  Administered 2025-01-12 (×3): 1 [IU] via SUBCUTANEOUS
  Administered 2025-01-13: 2 [IU] via SUBCUTANEOUS
  Administered 2025-01-13 – 2025-01-14 (×4): 1 [IU] via SUBCUTANEOUS
  Filled 2025-01-11: qty 2
  Filled 2025-01-11 (×6): qty 1
  Filled 2025-01-11: qty 2
  Filled 2025-01-11: qty 1

## 2025-01-11 MED ORDER — HYDROCORTISONE ACETATE 25 MG RE SUPP
25.0000 mg | Freq: Every day | RECTAL | Status: DC
  Administered 2025-01-11: 25 mg via RECTAL
  Filled 2025-01-11 (×3): qty 1

## 2025-01-11 MED ORDER — FOLIC ACID 1 MG PO TABS
1.0000 mg | ORAL_TABLET | Freq: Every day | ORAL | Status: DC
  Administered 2025-01-11 – 2025-01-14 (×3): 1 mg via ORAL
  Filled 2025-01-11 (×3): qty 1

## 2025-01-11 MED ORDER — OXYCODONE HCL 5 MG PO TABS: 5.0000 mg | ORAL_TABLET | ORAL | Status: DC | PRN

## 2025-01-11 MED ORDER — LACTATED RINGERS IV SOLN: INTRAVENOUS | Status: AC

## 2025-01-11 MED ORDER — CLOBETASOL PROPIONATE 0.05 % EX OINT
1.0000 | TOPICAL_OINTMENT | Freq: Every day | CUTANEOUS | Status: DC
  Administered 2025-01-11 – 2025-01-13 (×3): 1 via TOPICAL
  Filled 2025-01-11: qty 15

## 2025-01-11 MED ORDER — GABAPENTIN 300 MG PO CAPS
300.0000 mg | ORAL_CAPSULE | Freq: Every day | ORAL | Status: DC
  Administered 2025-01-11 – 2025-01-14 (×4): 300 mg via ORAL
  Filled 2025-01-11 (×4): qty 1

## 2025-01-11 MED ORDER — TRIAMCINOLONE ACETONIDE 0.1 % EX OINT
1.0000 | TOPICAL_OINTMENT | Freq: Every day | CUTANEOUS | Status: DC
  Administered 2025-01-11 – 2025-01-13 (×3): 1 via TOPICAL
  Filled 2025-01-11: qty 15
  Filled 2025-01-11: qty 80

## 2025-01-11 MED ORDER — METOPROLOL TARTRATE 25 MG PO TABS
12.5000 mg | ORAL_TABLET | Freq: Two times a day (BID) | ORAL | Status: DC
  Administered 2025-01-11 – 2025-01-12 (×4): 12.5 mg via ORAL
  Filled 2025-01-11 (×4): qty 1

## 2025-01-11 MED ORDER — ORAL CARE MOUTH RINSE: 15.0000 mL | OROMUCOSAL | Status: DC | PRN

## 2025-01-11 MED ORDER — GABAPENTIN 300 MG PO CAPS: 300.0000 mg | ORAL_CAPSULE | Freq: Two times a day (BID) | ORAL | Status: DC

## 2025-01-11 MED ORDER — HEPARIN BOLUS VIA INFUSION
1300.0000 [IU] | Freq: Once | INTRAVENOUS | Status: AC
  Administered 2025-01-11: 1300 [IU] via INTRAVENOUS
  Filled 2025-01-11: qty 1300

## 2025-01-11 MED ORDER — DOXYCYCLINE HYCLATE 100 MG PO TABS
100.0000 mg | ORAL_TABLET | Freq: Every day | ORAL | Status: DC
  Administered 2025-01-11 – 2025-01-14 (×4): 100 mg via ORAL
  Filled 2025-01-11 (×4): qty 1

## 2025-01-11 MED ORDER — SODIUM CHLORIDE 0.9 % IV SOLN: INTRAVENOUS | Status: DC

## 2025-01-11 MED ORDER — PROCHLORPERAZINE EDISYLATE 10 MG/2ML IJ SOLN: 5.0000 mg | Freq: Four times a day (QID) | INTRAMUSCULAR | Status: DC | PRN

## 2025-01-11 MED ORDER — ACETAMINOPHEN 325 MG PO TABS
650.0000 mg | ORAL_TABLET | Freq: Four times a day (QID) | ORAL | Status: DC | PRN
  Administered 2025-01-11: 650 mg via ORAL
  Filled 2025-01-11: qty 2

## 2025-01-11 MED ORDER — PREDNISONE 5 MG PO TABS
10.0000 mg | ORAL_TABLET | Freq: Every day | ORAL | Status: DC
  Administered 2025-01-11 – 2025-01-13 (×2): 10 mg via ORAL
  Filled 2025-01-11 (×2): qty 2

## 2025-01-11 MED ORDER — MELATONIN 5 MG PO TABS: 5.0000 mg | ORAL_TABLET | Freq: Every evening | ORAL | Status: DC | PRN

## 2025-01-11 MED ORDER — GABAPENTIN 300 MG PO CAPS
600.0000 mg | ORAL_CAPSULE | Freq: Every day | ORAL | Status: DC
  Administered 2025-01-11 – 2025-01-13 (×3): 600 mg via ORAL
  Filled 2025-01-11 (×3): qty 2

## 2025-01-11 MED ORDER — METHOTREXATE SODIUM 2.5 MG PO TABS: 15.0000 mg | ORAL_TABLET | ORAL | Status: DC

## 2025-01-11 NOTE — Consult Note (Addendum)
" °  CLINICAL SUPPORT TEAM - WOUND OSTOMY AND CONTINENCE TEAM  CONSULTATION SERVICES   WOC Nurse-Inpatient Note   WOC Nurse Consult Note: Reason for Consult: Requested to assess a chronic wound on right ankle. Follow by wound care clinic. This remote consultation was conducted using wound digital images.  Pertinent information was reviewed in order to determine recommendations. Following outpatient wound care clinic recommendations.  Wound type: Venous insufficiency. Pressure Injury POA: NA Measurement: aproxx 2 cm x 1 cm x 0.1 cm. (See nursing flowsheet). Wound bed: 100% yellow slough. Drainage (amount, consistency, odor) Small amount, serous. Periwound: intact. Dressing procedure/placement/frequency: Wound treatment conduct by outpatient clinic: Cleanse the wound with Vashe, not rinse. Topical : Triamcinolone  and Clobetasol  Propionate Ointment, 0. 05%  x Per Day/ 30 Days. (Started at 01/03/2025).  WOC team will not plan to follow further. Please reconsult if further assistance is needed. Thank-you,  Lela Holm MSN, RN, CWCN, CNS.  (Phone [phone])      "

## 2025-01-11 NOTE — TOC Initial Note (Signed)
 Transition of Care [facility]) - Initial/Assessment Note    Patient Details  Name: Shawn [name] MRN: [id number] Date of Birth: 16-Jun-1961  Transition of [facility]) CM/SW Contact:    Bascom Service, RN Phone Number: 01/11/2025, 3:49 PM  Clinical Narrative: d/c plan home. HOH r ear.Spouse does R ankle wound care. Has own transport home.                  Expected Discharge Plan: Home/Self Care Barriers to Discharge: Continued Medical Work up   Patient Goals and CMS Choice Patient states their goals for this hospitalization and ongoing recovery are:: Home CMS Medicare.gov Compare Post Acute Care list provided to:: Patient Represenative (must comment) (Friend)   [place] ownership interest in [facility].provided to:: Spouse    Expected Discharge Plan and Services   Discharge Planning Services: CM Consult   Living arrangements for the past 2 months: Single Family Home                                      Prior Living Arrangements/Services Living arrangements for the past 2 months: Single Family Home Lives with:: Spouse                   Activities of Daily Living   ADL Screening (condition at time of admission) Independently performs ADLs?: Yes (appropriate for developmental age) Is the patient deaf or have difficulty hearing?: Yes Does the patient have difficulty seeing, even when wearing glasses/contacts?: No Does the patient have difficulty concentrating, remembering, or making decisions?: No  Permission Sought/Granted                  Emotional Assessment              Admission diagnosis:  Atrial flutter (HCC) [I48.92] Elevated troponin [R79.89] Nausea vomiting and diarrhea [R11.2, R19.7] Atrial flutter, unspecified type [facility]) [I48.92] Patient Active Problem List   Diagnosis Date Noted   Hematemesis of fresh blood 01/11/2025   Hematochezia 01/11/2025   Preprocedural cardiovascular examination 01/11/2025   Bilateral  sensorineural hearing loss 01/11/2025   Benign hypertension 01/11/2025   New onset atrial flutter (HCC) 01/11/2025   Nausea vomiting and diarrhea 01/11/2025   Diabetes mellitus type 2, controlled (HCC) 01/11/2025   Atrial flutter (HCC) 01/10/2025   Aortic atherosclerosis 04/27/2023   PVC's (premature ventricular contractions) 01/22/2023   Near syncope 01/22/2023   Claudication of both lower extremities 06/12/2022   Atrial tachycardia 06/12/2022   Unexplained weight loss 06/12/2022   Lower extremity edema 06/12/2022   MGUS (monoclonal gammopathy of unknown significance) 02/07/2022   Multinodular goiter 10/04/2015   GERD (gastroesophageal reflux disease) 05/22/2015   Sepsis (HCC) 08/27/2012   Lower urinary tract infectious disease 08/26/2012   Hydronephrosis with obstructing calculus 08/25/2012   Hyponatremia 08/24/2012   Nephrolithiasis 08/24/2012   Acute renal failure 08/24/2012   Leukocytosis 08/24/2012   Hypertension    Blind right eye    PCP:  Patrcia Lynwood RAMAN, MD Pharmacy:   [id number] [address], [place] - [address] [place] [address] Phone: [phone] Fax: [phone]  [facility] - [place], [place] - [address]  [place] [address] Phone: [phone] Fax: [phone]     Social Drivers of Health (SDOH) Social History: SDOH Screenings   Food Insecurity: No Food Insecurity (01/11/2025)  Housing: Low Risk (01/11/2025)  Transportation  Needs: No Transportation Needs (01/11/2025)  Utilities: Not At Risk (01/11/2025)  Financial Resource Strain: Low Risk (05/13/2024)   Received from [facility]  Social Connections: Socially Integrated (01/11/2025)  Tobacco Use: Medium Risk (01/11/2025)   SDOH Interventions:     Readmission Risk Interventions     No data to display

## 2025-01-11 NOTE — Progress Notes (Signed)
 PHARMACY - ANTICOAGULATION CONSULT NOTE  Pharmacy Consult for Heparin  Indication: atrial fibrillation  Allergies[1]  Patient Measurements: Height: 6' 2 (188 cm) Weight: 92.8 kg (204 lb 8 oz) IBW/kg (Calculated) : 82.2 HEPARIN  DW (KG): 92.8  Vital Signs: Temp: 98.4 F (36.9 C) (01/28 0337) Temp Source: Oral (01/28 0337) BP: 126/76 (01/28 0337) Pulse Rate: 82 (01/28 0337)  Labs: Recent Labs    01/10/25 1659 01/11/25 0540  HGB 17.3* 14.9  HCT 52.3* 45.4  PLT 339 269  HEPARINUNFRC  --  <0.10*  CREATININE 0.93 0.92    Estimated Creatinine Clearance: 93.1 mL/min (by C-G formula based on SCr of 0.92 mg/dL).   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  Not on anticoagulation PTA.  Patient reports hematemesis on admission. He does not have any significant GI history, however GI to consult.  He receives wound care on a routine basis outpatient for chronic right ankle wound.  Cardiology consulted - possible cardioversion.  Pharmacy consulted for heparin   management.  Today, -Heparin  level < 0.10 - subtherapeutic with heparin  infusing at 1100 units/hr -CBC stable (Hgb down to 14.9 from 17.3 but WNL) -No complications of therapy noted overnight per RN report  Goal of Therapy:  Heparin  level 0.3-0.7 units/ml Monitor platelets by anticoagulation protocol: Yes   Plan:  -Heparin  bolus 1300 unit -Increase heparin  infusion to 1400 units/hr -Check heparin  level ~6h after rate increase -Daily heparin  level & CBC while on heparin    Stefano MARLA Bologna, PharmD, BCPS Clinical Pharmacist 01/11/2025 7:30 AM        [1] No Known Allergies

## 2025-01-11 NOTE — Plan of Care (Signed)

## 2025-01-11 NOTE — Progress Notes (Addendum)
 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  Patient noted to have 2 episodes of hematemesis.  Patient noted to have an episode of diarrhea at the wound care center 1 further episode at home prior to presentation however no further loose/watery stools.  Patient seen in the ED noted to be on new onset atrial flutter.  Patient admitted for new onset atrial flutter, concern for hematemesis.  Patient placed on beta-blocker and IV heparin .  GI and cardiology consulted.   Assessment & Plan:   Principal Problem:   Atrial flutter (HCC) Active Problems:   Hematemesis of fresh blood   Hematochezia   Preprocedural cardiovascular examination   Bilateral sensorineural hearing loss   Benign hypertension   New onset atrial flutter (HCC)   Nausea vomiting and diarrhea  #1 new onset atrial flutter -CHA2DS2 Vasc score 2 - Patient noted prior to onset of diarrhea and was having some lightheadedness and some dizziness however denied any palpitations. - Patient did note to cardiology of snoring, daytime somnolence, poor sleep quality. - TSH noted at 0.171 - Free T4 ordered and pending. - Patient currently in atrial flutter on telemetry however rate controlled. -2D echo done with EF of 55 to 60%,NWMA, mild LVH, normal right ventricular systolic function, normal mitral valve structure, trivial MVR, no evidence of mitral stenosis. - Continue Lopressor  for rate control. - Patient on IV heparin . - Patient seen in consultation by GI and to undergo EGD prior to starting patient on  oral long-term anticoagulation. - Patient seen in consultation by cardiology who are recommending outpatient sleep study, transition to Eliquis  5 mg twice daily when cleared to do so by GI. - Cardiology recommending outpatient follow-up with primary cardiologist or A-fib clinic and then at that time if patient is still in atrial flutter may need cardioversion. - Appreciate cardiology input and recommendations.  2.  Hematemesis -Questionable etiology.  May be secondary to Mallory-Weiss tear versus peptic ulcer disease versus gastritis versus esophagitis. - Patient noted to have episodes of nausea and emesis initially as dry heaves which progressed to food like emesis followed by bloody emesis. - Patient with no further hematemesis. - It is noted per GI that patient had EGD 02/2023 that showed grade B reflux esophagitis, 3 cm hiatal hernia, gastritis. - CT abdomen and pelvis done with no acute findings. - Hemoglobin currently stable at 14.9 from 17.3 on admission likely dilutional. -Continue IV PPI every 12 hours - Follow H&H. - Patient seen in consultation by GI patient scheduled for EGD tomorrow 01/12/2025. - Appreciate GI input and recommendations.  3.  Diarrhea -Noted to have 2 episodes of diarrhea day prior to admission none since admission. - Discontinue enteric precautions. - DC C. difficile PCR. - GI pathogen panel ordered will discontinue.  4.  Hypertension -BP currently stable - Continue Lopressor .  5.  Wegener's granulomatosis -Noted to be on methotrexate  weekly, daily folic acid , prednisone , daily doxycycline  prior to admission which we will continue.  6.  Leukocytosis -Likely reactive. - Also noted on chronic steroid. - Leukocytosis trending down.  7.  Type  2 diabetes mellitus with hyperglycemia  - Hemoglobin A1c 5.5 (01/27/2022) - Hemoglobin A1c pending. - CBG noted at 101 this morning. - Patient noted to be on glipizide prior to admission which we will hold. -  SSI.  8.  Rectal bleeding Hematochezia -Likely secondary to hemorrhoids. -GI recommended Anusol  25 mg suppository rectally nightly x 3 nights. -Outpatient follow-up per GI  9.  Chronic right lower extremity wound, POA - Continue current wound care. - Outpatient follow-up by wound care clinic.        DVT prophylaxis: Heparin   Code Status: Full Family Communication: Updated patient and wife at bedside. Disposition: Likely home when cleared by GI and cardiology.  Status is: Observation The patient remains OBS appropriate and will d/c before 2 midnights.   Consultants:  Cardiology: Dr. Michele 01/11/2025 Gastroenterology: Dr. Federico 01/11/2025  Procedures:  CT abdomen and pelvis 01/10/2025 2D echo 01/11/2025   Antimicrobials:  Anti-infectives (From admission, onward)    Start     Dose/Rate Route Frequency Ordered Stop   01/11/25 1000  doxycycline  (VIBRA -TABS) tablet 100 mg        100 mg Oral Daily 01/11/25 0008           Subjective: Patient lying in bed.  Denies any chest pain or shortness of breath.  No abdominal pain.  No further nausea or vomiting.  Stated had 2 episodes of bright red blood in his emesis.  Denies any black or melanotic stools.  Patient states last episode of diarrhea was last night and none since.  Patient states had a total of 2 episodes of watery loose stools yesterday.  Wife at bedside.  Patient not in atrial flutter on telemetry.  Objective: Vitals:   01/10/25 2300 01/10/25 2317 01/11/25 0337 01/11/25 1353  BP: (!) 134/91 124/88 126/76 116/70  Pulse: (!) 117 85 82 98  Resp: 19 16 15 20   Temp:  98.4 F (36.9 C) 98.4 F (36.9 C) 98.3 F (36.8 C)  TempSrc:  Oral Oral   SpO2: 98% 99% 95% 95%  Weight:  92.8 kg    Height:  6' 2 (1.88 m)      Intake/Output Summary (Last 24 hours) at 01/11/2025 1538 Last data filed at 01/11/2025 1350 Gross per 24 hour  Intake 2552.36 ml  Output 900 ml  Net 1652.36 ml   Filed Weights   01/10/25 2317   Weight: 92.8 kg    Examination:  General exam: NAD. Respiratory system: Clear to auscultation. Respiratory effort normal. Cardiovascular system: Irregular.  No JVD, no murmurs rubs or gallops.  No lower extremity edema.  Gastrointestinal system: Abdomen is nondistended, soft and nontender. No organomegaly or masses felt. Normal bowel sounds heard. Central nervous system: Alert and oriented. No focal neurological deficits. Extremities: Symmetric 5 x 5 power. Skin: No rashes, lesions or ulcers Psychiatry: Judgement and insight appear normal. Mood & affect appropriate.     Data Reviewed: I have personally reviewed following labs and imaging studies  CBC: Recent Labs  Lab 01/10/25 1659 01/11/25 0540  WBC 18.2* 10.7*  NEUTROABS 15.9*  --   HGB 17.3* 14.9  HCT 52.3* 45.4  MCV 95.6 97.8  PLT 339 269    Basic Metabolic Panel: Recent Labs  Lab 01/10/25 1659 01/11/25 0540  NA 131* 137  K 4.7 4.3  CL 97* 104  CO2 21* 24  GLUCOSE 216* 95  BUN 20 14  CREATININE 0.93 0.92  CALCIUM 9.4 8.4*  MG  --  2.2  PHOS  --  2.9    GFR: Estimated Creatinine Clearance: 93.1 mL/min (by C-G formula based on SCr of 0.92 mg/dL).  Liver Function Tests: Recent Labs  Lab 01/10/25 1659  AST 21  ALT 19  ALKPHOS 57  BILITOT 1.6*  PROT 7.8  ALBUMIN 4.6    CBG: Recent Labs  Lab 01/10/25 1139 01/11/25 0025 01/11/25 0340 01/11/25 0744 01/11/25 1159  GLUCAP 139* 118* 116* 101* 95     Recent Results (from the past 240 hours)  Blood culture (routine x 2)     Status: None (Preliminary result)   Collection Time: 01/10/25  6:45 PM   Specimen: BLOOD LEFT HAND  Result Value Ref Range Status   Specimen Description   Final    BLOOD LEFT HAND Performed at [facility], [address] [place], [place] [address]    Special Requests   Final    BOTTLES DRAWN AEROBIC AND ANAEROBIC Blood Culture adequate volume Performed at [facility], [address],  [place], [place] [address]    Culture   Final    NO GROWTH < 12 HOURS Performed at [facility], [address], [place], [place] [address]    Report Status PENDING  Incomplete  Blood culture (routine x 2)     Status: None (Preliminary result)   Collection Time: 01/10/25  6:45 PM   Specimen: BLOOD  Result Value Ref Range Status   Specimen Description   Final    BLOOD LEFT ANTECUBITAL Performed at [facility], [address] [place], [place] [address]    Special Requests   Final    BOTTLES DRAWN AEROBIC ONLY Blood Culture adequate volume Performed at [facility], [address], [place], [place] [address]    Culture   Final    NO GROWTH < 12 HOURS Performed at [facility], [address], [place], [place] [address]    Report Status PENDING  Incomplete         Radiology Studies: ECHOCARDIOGRAM COMPLETE Result Date: 01/11/2025    ECHOCARDIOGRAM REPORT   Patient Name:   Shawn [name] Date of Exam: 01/11/2025 Medical Rec #:  [id number]     Height:       74.0 in Accession #:    [id number]    Weight:       204.5 lb Date of Birth:  20-Oct-1961    BSA:          2.194 m Patient Age:    63 years      BP:           126/76 mmHg Patient Gender: M             HR:           80 bpm. Exam Location:  Inpatient Procedure: 2D Echo, Cardiac Doppler and Color Doppler (Both Spectral and Color            Flow Doppler were utilized during procedure). Indications:    I48.92* Unspecified atrial flutter  History:        Patient has prior history of Echocardiogram examinations, most                 recent 07/04/2022. Risk Factors:Former Smoker and Hypertension.  Sonographer:    Merlynn Argyle Referring Phys: [id number] CAROLE N HALL IMPRESSIONS  1. Left ventricular ejection fraction, by estimation, is 55 to 60%. The left ventricle has normal function. The left ventricle has no regional wall  motion abnormalities. There is mild left ventricular hypertrophy. Left ventricular  diastolic parameters are indeterminate.  2. Right ventricular systolic function is normal. The right ventricular size is normal. There is normal pulmonary artery systolic pressure.  3. The mitral valve is normal in structure. Trivial mitral valve regurgitation. No evidence of mitral stenosis.  4. The aortic valve is tricuspid. There is mild calcification of the aortic valve. Aortic valve regurgitation is not visualized. Aortic valve sclerosis/calcification is present, without any evidence of aortic stenosis.  5. Aortic dilatation noted. There is borderline dilatation of the aortic root, measuring 38 mm. There is borderline dilatation of the ascending aorta, measuring 38 mm.  6. The inferior vena cava is dilated in size with >50% respiratory variability, suggesting right atrial pressure of 8 mmHg. FINDINGS  Left Ventricle: Left ventricular ejection fraction, by estimation, is 55 to 60%. The left ventricle has normal function. The left ventricle has no regional wall motion abnormalities. The left ventricular internal cavity size was normal in size. There is  mild left ventricular hypertrophy. Left ventricular diastolic parameters are indeterminate. Right Ventricle: The right ventricular size is normal. No increase in right ventricular wall thickness. Right ventricular systolic function is normal. There is normal pulmonary artery systolic pressure. The tricuspid regurgitant velocity is 2.01 m/s, and  with an assumed right atrial pressure of 8 mmHg, the estimated right ventricular systolic pressure is 24.2 mmHg. Left Atrium: Left atrial size was normal in size. Right Atrium: Right atrial size was normal in size. Pericardium: There is no evidence of pericardial effusion. Mitral Valve: The mitral valve is normal in structure. Trivial mitral valve regurgitation. No evidence of mitral valve stenosis. Tricuspid Valve: The tricuspid valve is normal in structure. Tricuspid valve regurgitation is trivial. No evidence of  tricuspid stenosis. Aortic Valve: The aortic valve is tricuspid. There is mild calcification of the aortic valve. Aortic valve regurgitation is not visualized. Aortic valve sclerosis/calcification is present, without any evidence of aortic stenosis. Pulmonic Valve: The pulmonic valve was normal in structure. Pulmonic valve regurgitation is trivial. No evidence of pulmonic stenosis. Aorta: Aortic dilatation noted. There is borderline dilatation of the aortic root, measuring 38 mm. There is borderline dilatation of the ascending aorta, measuring 38 mm. Venous: The inferior vena cava is dilated in size with greater than 50% respiratory variability, suggesting right atrial pressure of 8 mmHg. IAS/Shunts: No atrial level shunt detected by color flow Doppler.  LEFT VENTRICLE PLAX 2D LVIDd:         5.00 cm LVIDs:         3.60 cm LV PW:         1.20 cm LV IVS:        1.20 cm LVOT diam:     2.20 cm LV SV:         77 LV SV Index:   35 LVOT Area:     3.80 cm  RIGHT VENTRICLE             IVC RV Basal diam:  3.70 cm     IVC diam: 2.20 cm RV S prime:     14.80 cm/s TAPSE (M-mode): 1.8 cm LEFT ATRIUM             Index        RIGHT ATRIUM           Index LA diam:        3.70 cm 1.69 cm/m   RA Area:     11.00 cm LA  Vol [facility]):   39.2 ml 17.87 ml/m  RA Volume:   19.10 ml  8.71 ml/m LA Vol (A4C):   43.5 ml 19.83 ml/m LA Biplane Vol: 43.2 ml 19.69 ml/m  AORTIC VALVE LVOT Vmax:   109.00 cm/s LVOT Vmean:  76.400 cm/s LVOT VTI:    0.203 m  AORTA Ao Root diam: 3.80 cm Ao Asc diam:  3.80 cm TRICUSPID VALVE TR Peak grad:   16.2 mmHg TR Vmax:        201.00 cm/s  SHUNTS Systemic VTI:  0.20 m Systemic Diam: 2.20 cm Toribio Fuel MD Electronically signed by Toribio Fuel MD Signature Date/Time: 01/11/2025/8:41:17 AM    Final    CT ABDOMEN PELVIS W CONTRAST Result Date: 01/10/2025 EXAM: CT ABDOMEN AND PELVIS WITH CONTRAST 01/10/2025 07:50:52 PM TECHNIQUE: CT of the abdomen and pelvis was performed with the administration of 100 mL  of iohexol  (OMNIPAQUE ) 300 MG/ML solution. Multiplanar reformatted images are provided for review. Automated exposure control, iterative reconstruction, and/or weight-based adjustment of the mA/kV was utilized to reduce the radiation dose to as low as reasonably achievable. COMPARISON: Comparison study 02/11/2023. CLINICAL HISTORY: Abdominal pain and diarrhea. FINDINGS: LOWER CHEST: No acute abnormality. LIVER: Fatty infiltration of the liver is noted. GALLBLADDER AND BILE DUCTS: The gallbladder is well distended. No dependent gallstones are seen. No biliary ductal dilatation. SPLEEN: The spleen appears within normal limits. PANCREAS: No acute abnormality. ADRENAL GLANDS: No acute abnormality. KIDNEYS, URETERS AND BLADDER: The kidneys demonstrate no renal calculi or obstructive changes. Normal excretion is noted on delayed images. No perinephric or periureteral stranding. The bladder is well distended. GI AND BOWEL: Stomach and small bowel are within normal limits. No obstructive or inflammatory changes of the colon are seen. The appendix has been surgically removed. There is no bowel obstruction. PERITONEUM AND RETROPERITONEUM: No ascites. No free air. VASCULATURE: Aorta is normal in caliber. LYMPH NODES: No lymphadenopathy. REPRODUCTIVE ORGANS: No acute abnormality. BONES AND SOFT TISSUES: No acute osseous abnormality is seen. Stable SI joint sclerosis is seen. Postsurgical changes in the lower lumbar spine are noted. No focal soft tissue abnormality. IMPRESSION: 1. No acute findings in the abdomen or pelvis. Electronically signed by: Oneil Devonshire MD 01/10/2025 08:05 PM EST RP Workstation: [id number]        Scheduled Meds:  clobetasol  ointment  1 Application Topical Daily   doxycycline   100 mg Oral Daily   folic acid   1 mg Oral Daily   gabapentin   300 mg Oral Daily   And   gabapentin   600 mg Oral QHS   insulin  aspart  0-9 Units Subcutaneous Q4H   [START ON 01/14/2025] methotrexate   15 mg Oral Weekly    metoprolol  tartrate  12.5 mg Oral BID   pantoprazole  (PROTONIX ) IV  40 mg Intravenous BID   predniSONE   10 mg Oral Q lunch   saccharomyces boulardii  250 mg Oral BID   triamcinolone  ointment  1 Application Topical Daily   Continuous Infusions:  heparin  1,400 Units/hr (01/11/25 0740)   lactated ringers  50 mL/hr at 01/11/25 0330     LOS: 0 days    Time spent: \40 minutes    Toribio Hummer, MD [facility]   To contact the attending provider between 7A-7P or the covering provider during after hours 7P-7A, please log into the web site [email or web address] and access using universal [place] password for that web site. If you do not have the password, please call the hospital operator.  01/11/2025, 3:38 PM    "

## 2025-01-11 NOTE — Consult Note (Addendum)
 "  Referring Provider: Dr. Toribio Hummer Primary Care Physician:  Patrcia Lynwood RAMAN, MD Primary Gastroenterologist:  Dr. Shila  Reason for Consultation:  N/V, hematemesis   HPI: Shawn [name] is a 64 y.o. male with a past medical history of hypertension, atrial tachycardia, kidney stones, sensorineural hearing loss s/p trochlear transplant, diabetes mellitus type 2, chronic right ankle wound, MGUS, vasculitis, leukocytoclastic vasculitis, Wegener's granulomatosis on Methotrexate  and Prednisone , GERD, anal fissure, hemorrhoids and colon polyps.   He presented to the ED 01/10/2025 due to having nausea, vomiting and nonbloody diarrhea for 1 day.  He reported emesis was bloody. He was found to be in atrial flutter with a rate of 113.  Hemodynamically stable. Labs in the ED showed a WBC count of 18.2.  Hemoglobin 17.3 ( Hg 15.9 on 12/22).  Sodium 131.  Potassium 4.7.  BUN 20.  Creatinine 0.93.  Total bili 1.6.  Alk phos 57.  AST 21.  ALT 19.  Lipase 69.  Troponin 24 and 38.  Respiratory panel pending.  Blood cultures in process.  GI pathogen panel and C. difficile quick screen ordered, not yet collected.  CTAP without acute intra-abdominal/pelvic pathology to explain symptoms.  He received Lopressor  and was started on IV Heparin  in the ED. TTE showed normal LVEF of 55 to 60%, mild LVH, normal RV systolic function, mild aortic valve calcification with normal function, and borderline aortic root dilation.   He developed nausea with vomiting 01/10/2025.  He vomited numerous times by excessive dry heaves.  He endorses have 2 episodes of hematemesis, emesis contained approximately 1 shot glass worth of bright red blood x 2 episodes.  He had 1 episode of diarrhea described as watery and dark brown without obvious blood.  He ate egg frittata on 1/26 and thought that may have triggered his GI symptoms.  No abdominal pain.  No fevers.  He has a history of GERD which he takes Pantoprazole  20 mg once daily.  He takes  ibuprofen  800 mg 4 days monthly for aches and pains.  Previously not on AC.  He typically passes a normal brown formed stool most days.  He has intermittent bright red blood per the rectum which he attributes to having hemorrhoids, last passed bright red blood per the rectum x 2 episodes 1 week ago.  He underwent an EGD 02/23/2023 which showed a 3 cm hiatal hernia and reflux esophagitis.  A colonoscopy was done on the same date which identified internal and external hemorrhoids and 1 sessile serrated polyp was removed from the transverse colon.  Cardiologist Dr. Madonna just saw the patient and provided cardiac clearance for EGD and okay to hold Heparin  IV for 4 hours.  Pending GI evaluation, patient will eventually transition from IV Heparin  to oral anticoagulation with plans for possible cardioversion in 4 weeks as an outpatient.  Patient denies having any chest pain and occasionally has intermittent DOE with exertion.  GI PROCEDURES:  EGD 02/23/23  - LA Grade B reflux esophagitis with no bleeding. Biopsied.  - 3 cm hiatal hernia. - Gastritis. Biopsied. - Erythematous duodenopathy. Biopsied.   Colonoscopy 02/23/23 - One 5 mm polyp in the transverse colon (SSP), removed with a cold snare. Resected and retrieved. - Diverticulosis in the sigmoid colon and in the descending colon.  - Non- bleeding external and internal hemorrhoids. - 5 year recall colonoscopy   1. Surgical [P], small bowel - ACUTE PEPTIC DUODENITIS WITH REACTIVE CHANGES - DETACHED FRAGMENT OF GASTRIC, OXYNTIC TYPE MUCOSA WITH MILD CHRONIC  INFLAMMATION - NEGATIVE FOR HELICOBACTER ORGANISMS ON H&E STAIN - NEGATIVE FOR FEATURES OF CELIAC DISEASE 2. Surgical [P], gastric antrum and gastric body - OXYNTIC MUCOSA WITH MILD CHRONIC INFLAMMATION AND FOCAL, EARLY FUNDIC GLAND POLYP LIKE CHANGES - IMMUNOHISTOCHEMICAL STAIN FOR H. PYLORI IS NEGATIVE 3. Surgical [P], mid esophagus and distal esophagus - SQUAMOUS MUCOSA WITH REACTIVE CHANGES  SUGGESTIVE OF REFLUX - DETACHED FRAGMENT OF GASTRIC, CARDIA TYPE MUCOSA WITH CHRONIC INFLAMMATION - NEGATIVE FOR HELICOBACTER ORGANISMS ON H&E STAIN - NEGATIVE FOR EOSINOPHILIA - NEGATIVE FOR INTESTINAL METAPLASIA OR DYSPLASIA 4. Surgical [P], colon, transverse, polyp (1) - SERRATED POLYP, WITH FEATURES SUGGESTIVE OF A SESSILE SERRATED POLYP WITHOUT CYTOLOGIC DYSPLASIA - NEGATIVE FOR MALIGNANC    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 69   BMI 30.0-30.9,adult    Chest pain    Dyspnea on exertion    ED (erectile dysfunction)    GERD (gastroesophageal reflux disease)    History of anal fissures    Episodic resulting in hematochezia   History of gastroesophageal reflux (GERD)    Hypertension    Hypogonadism in male    Irregular bowel habits    Kidney stone    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-followed by Dr Revonda   Multiple thyroid  nodules    Onychomycosis    treated with lamisil in the past   Other chronic pain    Pain in right hand    Palpitations    Prediabetes    Pruritus    Retinal detachment    right eye caused blindness   Tinea cruris    Tinnitus    Total retinal detachment of right eye     Past Surgical History:  Procedure Laterality Date   APPENDECTOMY     BACK SURGERY     COLONOSCOPY     COLONOSCOPY WITH ESOPHAGOGASTRODUODENOSCOPY (EGD)  02/23/2023   CYSTOSCOPY W/ URETERAL STENT PLACEMENT  08/24/2012   Procedure: CYSTOSCOPY WITH RETROGRADE PYELOGRAM/URETERAL STENT PLACEMENT;  Surgeon: Arlena LILLETTE Gal, MD;  Location: [facility];  Service: Urology;  Laterality: Right;   CYSTOSCOPY/RETROGRADE/URETEROSCOPY  08/24/2012   Procedure: CYSTOSCOPY/RETROGRADE/URETEROSCOPY;  Surgeon: Arlena LILLETTE Gal, MD;  Location: [facility];  Service: Urology;  Laterality: Right;  Stone Basketing    CYSTOSCOPY/RETROGRADE/URETEROSCOPY/STONE EXTRACTION WITH BASKET Left 04/11/2013   Procedure: CYSTOSCOPY/RETROGRADE/URETEROSCOPY/STONE EXTRACTION WITH BASKET;  Surgeon: Arlena LILLETTE Gal, MD;  Location: [facility];  Service: Urology;  Laterality: Left;  BASKET STONE EXTRACTION WITH URETEROSCOPE     Right retina detachment      Prior to Admission medications  Medication Sig Start Date End Date Taking? Authorizing Provider  clobetasol  cream (TEMOVATE ) 0.05 % 1 Application. 02/20/24  Yes [provider]  doxycycline  (VIBRA -TABS) 100 MG tablet Take 100 mg by mouth daily. 08/31/24  Yes [provider]  folic acid  (FOLVITE ) 1 MG tablet Take 1 mg by mouth daily. 07/01/24  Yes [provider]  gabapentin  (NEURONTIN ) 300 MG capsule Take by mouth. Take 1 tab in the morning and 2 tabs in the evening 01/22/22  Yes [provider]  glipiZIDE (GLUCOTROL) 5 MG tablet Take 5 mg by mouth daily.   Yes [provider]  HYDROcodone -acetaminophen  (NORCO/VICODIN) 5-325 MG tablet Take 1 tablet by mouth every 8 (eight) hours as needed. 07/12/24  Yes  [provider]  methotrexate  (RHEUMATREX) 2.5 MG tablet Take 15 mg by mouth once a week. Caution:Chemotherapy. Protect from light.   Yes [provider]  metoprolol  succinate (TOPROL -XL) 50 MG 24 hr tablet Take 1 tablet (50 mg total) by mouth daily. 10/24/22  Yes Chandrasekhar, Mahesh A, MD  pantoprazole  (PROTONIX ) 40 MG tablet TAKE 1 TABLET (40 MG TOTAL) BY MOUTH TWICE A DAY BEFORE MEALS 10/16/23  Yes Nandigam, Kavitha V, MD  predniSONE  (DELTASONE ) 10 MG tablet Take 10 mg by mouth daily. 06/30/24  Yes [provider]  betamethasone dipropionate 0.05 % cream Apply topically. Patient not taking: Reported on 01/10/2025 01/29/24   [provider]  doxycycline  (DORYX ) 100 MG EC tablet Take 100 mg by mouth 2 (two) times daily. Patient not taking: Reported on 01/10/2025 07/14/24   [provider]  mupirocin ointment (BACTROBAN) 2 % Apply 1 Application topically 2 (two) times daily. To nostrils Patient not taking: Reported on 01/10/2025 06/21/24   [provider]  tadalafil  (CIALIS ) 5 MG tablet Take 5 mg by mouth. Patient not taking: Reported on 01/10/2025 12/04/22   [provider]  testosterone  cypionate (DEPOTESTOSTERONE CYPIONATE) 200 MG/ML injection INJECT 1.5 MLS INTO THE MUSCLE EVERY 14 DAYS *DISCARD REMAINDER OF OPEN VIAL AFTER EACH DOSE* 12/07/24   Twylla Glendia BROCKS, MD    Current Facility-Administered Medications  Medication Dose Route Frequency Provider Last Rate Last Admin   acetaminophen  (TYLENOL ) tablet 650 mg  650 mg Oral Q6H PRN Hall, Carole N, DO   650 mg at 01/11/25 9683   clobetasol  ointment (TEMOVATE ) 0.05 % 1 Application  1 Application Topical Daily Sebastian Toribio GAILS, MD       doxycycline  (VIBRA -TABS) tablet 100 mg  100 mg Oral Daily Shona Laurence N, DO   100 mg at 01/11/25 9152   folic acid  (FOLVITE ) tablet 1 mg  1 mg Oral Daily Shona Laurence N, DO   1 mg at 01/11/25 9152   gabapentin  (NEURONTIN ) capsule 300 mg  300 mg Oral Daily Sebastian Toribio GAILS, MD   300 mg at 01/11/25 0848   And   gabapentin  (NEURONTIN ) capsule 600 mg  600 mg Oral QHS Sebastian Toribio GAILS, MD       heparin  ADULT infusion 100 units/mL (25000 units/250mL)  1,400 Units/hr Intravenous Continuous Bethene Stefano POUR, RPH 14 mL/hr at 01/11/25 0740 1,400 Units/hr at 01/11/25 0740   insulin  aspart (novoLOG ) injection 0-9 Units  0-9 Units Subcutaneous Q4H Shona Laurence N, DO       lactated ringers  infusion   Intravenous Continuous Shona Laurence SAILOR, DO 50 mL/hr at 01/11/25 0330 Infusion Verify at 01/11/25 0330   melatonin tablet 5 mg  5 mg Oral QHS PRN Shona Laurence SAILOR, DO       [START ON 01/14/2025] methotrexate  (RHEUMATREX) tablet 15 mg  15 mg Oral Weekly Hall, Carole N, DO       metoprolol  tartrate (LOPRESSOR ) tablet 12.5 mg  12.5 mg Oral BID Shona Laurence N, DO   12.5 mg at 01/11/25  0750   Oral care mouth rinse  15 mL Mouth Rinse PRN Shona Laurence N, DO       oxyCODONE  (Oxy IR/ROXICODONE ) immediate release tablet 5-10 mg  5-10 mg Oral Q4H PRN Shona Laurence N, DO       pantoprazole  (PROTONIX ) injection 40 mg  40 mg Intravenous BID Shona Laurence N, DO   40 mg at 01/11/25 0848   predniSONE  (DELTASONE ) tablet 10 mg  10 mg  Oral Q lunch Bancroft, Carole N, DO       prochlorperazine  (COMPAZINE ) injection 5 mg  5 mg Intravenous Q6H PRN Shona Terry SAILOR, DO       saccharomyces boulardii (FLORASTOR) capsule 250 mg  250 mg Oral BID Shona Terry N, DO   250 mg at 01/11/25 9152   triamcinolone  ointment (KENALOG ) 0.1 % 1 Application  1 Application Topical Daily Sebastian Toribio GAILS, MD        Allergies as of 01/10/2025   (No Known Allergies)    Family History  Problem Relation Age of Onset   Diabetes Mother    Stroke Mother    Aneurysm Mother    Cancer Mother    Heart attack Father    Hypertension Father    CAD Father    Heart disease Father    Bladder Cancer Brother    Diabetes Maternal Grandmother    Hypertension Maternal Grandmother    Aneurysm Paternal Grandmother    Heart attack Paternal Grandfather    Hypertension Paternal Grandfather    Thyroid  disease Neg Hx     Social History   Socioeconomic History   Marital status: Married    Spouse name: Not on file   Number of children: Not on file   Years of education: Not on file   Highest education level: Not on file  Occupational History   Not on file  Tobacco Use   Smoking status: Former    Types: Pipe   Smokeless tobacco: Never  Vaping Use   Vaping status: Never Used  Substance and Sexual Activity   Alcohol use: Yes    Comment: ocassionally beer or wine   Drug use: No   Sexual activity: Never  Other Topics Concern   Not on file  Social History Narrative   Not on file   Social Drivers of Health   Tobacco Use: Medium Risk (01/11/2025)   Patient History    Smoking Tobacco Use: Former    Smokeless Tobacco  Use: Never    Passive Exposure: Not on file  Financial Resource Strain: Low Risk (05/13/2024)   Received from [facility]   Overall Financial Resource Strain (CARDIA)    Difficulty of Paying Living Expenses: Not hard at all  Food Insecurity: No Food Insecurity (01/11/2025)   Epic    Worried About Radiation Protection Practitioner of Food in the Last Year: Never true    Ran Out of Food in the Last Year: Never true  Transportation Needs: No Transportation Needs (01/11/2025)   Epic    Lack of Transportation (Medical): No    Lack of Transportation (Non-Medical): No  Physical Activity: Not on file  Stress: Not on file  Social Connections: Socially Integrated (01/11/2025)   Social Connection and Isolation Panel    Frequency of Communication with Friends and Family: Twice a week    Frequency of Social Gatherings with Friends and Family: Twice a week    Attends Religious Services: 1 to 4 times per year    Active Member of [employer] or Organizations: No    Attends Banker Meetings: 1 to 4 times per year    Marital Status: Married  Catering Manager Violence: Not At Risk (01/11/2025)   Epic    Fear of Current or Ex-Partner: No    Emotionally Abused: No    Physically Abused: No    Sexually Abused: No  Depression (PHQ2-9): Not on file  Alcohol Screen: Not on file  Housing: Low Risk (01/11/2025)  Epic    Unable to Pay for Housing in the Last Year: No    Number of Times Moved in the Last Year: 0    Homeless in the Last Year: No  Utilities: Not At Risk (01/11/2025)   Epic    Threatened with loss of utilities: No  Health Literacy: Not on file   Review of Systems: Gen: Denies fever, sweats or chills. No weight loss.  CV: Denies chest pain, palpitations or edema. Resp: See HPI.  GI: See HPI. GU : Denies urinary burning, blood in urine, increased urinary frequency or incontinence. MS: Chronic right foot wound. Derm: Denies rash, itchiness, skin lesions or unhealing ulcers. Psych: Denies depression,  anxiety, memory loss or confusion. Heme: Denies easy bruising, bleeding. Neuro:  Denies headaches, dizziness or paresthesias. Endo: + DM type II.   Physical Exam: Vital signs in last 24 hours: Temp:  [98.4 F (36.9 C)-99.2 F (37.3 C)] 98.4 F (36.9 C) (01/28 0337) Pulse Rate:  [80-117] 82 (01/28 0337) Resp:  [15-26] 15 (01/28 0337) BP: (124-158)/(69-91) 126/76 (01/28 0337) SpO2:  [95 %-99 %] 95 % (01/28 0337) Weight:  [92.8 kg] 92.8 kg (01/27 2317) Last BM Date : 01/10/25 General:  Alert 64 year old male in no acute distress.  Mildly hard of hearing. Head:  Normocephalic and atraumatic. Eyes: Right eye deformity. Ears:  Normal auditory acuity. Nose:  No deformity, discharge or lesions. Mouth:  Dentition intact. No ulcers or lesions.  Neck:  Supple. No lymphadenopathy or thyromegaly.  Lungs: Breath sounds clear throughout. No wheezes, rhonchi or crackles.  Heart: Regular rate and rhythm, no murmurs. Abdomen: Soft, nondistended.  Nontender.  Positive bowel sounds to all 4 quadrants. Rectal: Circumferential external hemorrhoids, mildly friable without active bleeding.  Internal hemorrhoids palpated without prolapse.  No blood or stool in the rectal vault.  RN at the bedside at time of exam. Musculoskeletal:  Symmetrical without gross deformities.  Pulses:  Normal pulses noted. Extremities: Right ankle with dressing dry and intact. Neurologic:  Alert and  oriented x 4. No focal deficits.  Skin:  Intact without significant lesions or rashes. Psych:  Alert and cooperative. Normal mood and affect.  Intake/Output from previous day: 01/27 0701 - 01/28 0700 In: 2312.4 [P.O.:240; I.V.:72.4; IV Piggyback:2000] Out: 600 [Urine:600] Intake/Output this shift: No intake/output data recorded.  Lab Results: Recent Labs    01/10/25 1659 01/11/25 0540  WBC 18.2* 10.7*  HGB 17.3* 14.9  HCT 52.3* 45.4  PLT 339 269   BMET Recent Labs    01/10/25 1659 01/11/25 0540  NA 131* 137  K  4.7 4.3  CL 97* 104  CO2 21* 24  GLUCOSE 216* 95  BUN 20 14  CREATININE 0.93 0.92  CALCIUM 9.4 8.4*   LFT Recent Labs    01/10/25 1659  PROT 7.8  ALBUMIN 4.6  AST 21  ALT 19  ALKPHOS 57  BILITOT 1.6*   PT/INR No results for input(s): LABPROT, INR in the last 72 hours. Hepatitis Panel No results for input(s): HEPBSAG, HCVAB, HEPAIGM, HEPBIGM in the last 72 hours.    Studies/Results: ECHOCARDIOGRAM COMPLETE Result Date: 01/11/2025    ECHOCARDIOGRAM REPORT   Patient Name:   BABE CLENNEY Date of Exam: 01/11/2025 Medical Rec #:  [id number]     Height:       74.0 in Accession #:    [id number]    Weight:       204.5 lb Date of Birth:  11-May-1961    BSA:  2.194 m Patient Age:    63 years      BP:           126/76 mmHg Patient Gender: M             HR:           80 bpm. Exam Location:  Inpatient Procedure: 2D Echo, Cardiac Doppler and Color Doppler (Both Spectral and Color            Flow Doppler were utilized during procedure). Indications:    I48.92* Unspecified atrial flutter  History:        Patient has prior history of Echocardiogram examinations, most                 recent 07/04/2022. Risk Factors:Former Smoker and Hypertension.  Sonographer:    Merlynn Argyle Referring Phys: [id number] CAROLE N HALL IMPRESSIONS  1. Left ventricular ejection fraction, by estimation, is 55 to 60%. The left ventricle has normal function. The left ventricle has no regional wall motion abnormalities. There is mild left ventricular hypertrophy. Left ventricular diastolic parameters are indeterminate.  2. Right ventricular systolic function is normal. The right ventricular size is normal. There is normal pulmonary artery systolic pressure.  3. The mitral valve is normal in structure. Trivial mitral valve regurgitation. No evidence of mitral stenosis.  4. The aortic valve is tricuspid. There is mild calcification of the aortic valve. Aortic valve regurgitation is not visualized. Aortic valve  sclerosis/calcification is present, without any evidence of aortic stenosis.  5. Aortic dilatation noted. There is borderline dilatation of the aortic root, measuring 38 mm. There is borderline dilatation of the ascending aorta, measuring 38 mm.  6. The inferior vena cava is dilated in size with >50% respiratory variability, suggesting right atrial pressure of 8 mmHg. FINDINGS  Left Ventricle: Left ventricular ejection fraction, by estimation, is 55 to 60%. The left ventricle has normal function. The left ventricle has no regional wall motion abnormalities. The left ventricular internal cavity size was normal in size. There is  mild left ventricular hypertrophy. Left ventricular diastolic parameters are indeterminate. Right Ventricle: The right ventricular size is normal. No increase in right ventricular wall thickness. Right ventricular systolic function is normal. There is normal pulmonary artery systolic pressure. The tricuspid regurgitant velocity is 2.01 m/s, and  with an assumed right atrial pressure of 8 mmHg, the estimated right ventricular systolic pressure is 24.2 mmHg. Left Atrium: Left atrial size was normal in size. Right Atrium: Right atrial size was normal in size. Pericardium: There is no evidence of pericardial effusion. Mitral Valve: The mitral valve is normal in structure. Trivial mitral valve regurgitation. No evidence of mitral valve stenosis. Tricuspid Valve: The tricuspid valve is normal in structure. Tricuspid valve regurgitation is trivial. No evidence of tricuspid stenosis. Aortic Valve: The aortic valve is tricuspid. There is mild calcification of the aortic valve. Aortic valve regurgitation is not visualized. Aortic valve sclerosis/calcification is present, without any evidence of aortic stenosis. Pulmonic Valve: The pulmonic valve was normal in structure. Pulmonic valve regurgitation is trivial. No evidence of pulmonic stenosis. Aorta: Aortic dilatation noted. There is borderline  dilatation of the aortic root, measuring 38 mm. There is borderline dilatation of the ascending aorta, measuring 38 mm. Venous: The inferior vena cava is dilated in size with greater than 50% respiratory variability, suggesting right atrial pressure of 8 mmHg. IAS/Shunts: No atrial level shunt detected by color flow Doppler.  LEFT VENTRICLE PLAX 2D LVIDd:  5.00 cm LVIDs:         3.60 cm LV PW:         1.20 cm LV IVS:        1.20 cm LVOT diam:     2.20 cm LV SV:         77 LV SV Index:   35 LVOT Area:     3.80 cm  RIGHT VENTRICLE             IVC RV Basal diam:  3.70 cm     IVC diam: 2.20 cm RV S prime:     14.80 cm/s TAPSE (M-mode): 1.8 cm LEFT ATRIUM             Index        RIGHT ATRIUM           Index LA diam:        3.70 cm 1.69 cm/m   RA Area:     11.00 cm LA Vol (A2C):   39.2 ml 17.87 ml/m  RA Volume:   19.10 ml  8.71 ml/m LA Vol (A4C):   43.5 ml 19.83 ml/m LA Biplane Vol: 43.2 ml 19.69 ml/m  AORTIC VALVE LVOT Vmax:   109.00 cm/s LVOT Vmean:  76.400 cm/s LVOT VTI:    0.203 m  AORTA Ao Root diam: 3.80 cm Ao Asc diam:  3.80 cm TRICUSPID VALVE TR Peak grad:   16.2 mmHg TR Vmax:        201.00 cm/s  SHUNTS Systemic VTI:  0.20 m Systemic Diam: 2.20 cm Toribio Fuel MD Electronically signed by Toribio Fuel MD Signature Date/Time: 01/11/2025/8:41:17 AM    Final    CT ABDOMEN PELVIS W CONTRAST Result Date: 01/10/2025 EXAM: CT ABDOMEN AND PELVIS WITH CONTRAST 01/10/2025 07:50:52 PM TECHNIQUE: CT of the abdomen and pelvis was performed with the administration of 100 mL of iohexol  (OMNIPAQUE ) 300 MG/ML solution. Multiplanar reformatted images are provided for review. Automated exposure control, iterative reconstruction, and/or weight-based adjustment of the mA/kV was utilized to reduce the radiation dose to as low as reasonably achievable. COMPARISON: Comparison study 02/11/2023. CLINICAL HISTORY: Abdominal pain and diarrhea. FINDINGS: LOWER CHEST: No acute abnormality. LIVER: Fatty infiltration of  the liver is noted. GALLBLADDER AND BILE DUCTS: The gallbladder is well distended. No dependent gallstones are seen. No biliary ductal dilatation. SPLEEN: The spleen appears within normal limits. PANCREAS: No acute abnormality. ADRENAL GLANDS: No acute abnormality. KIDNEYS, URETERS AND BLADDER: The kidneys demonstrate no renal calculi or obstructive changes. Normal excretion is noted on delayed images. No perinephric or periureteral stranding. The bladder is well distended. GI AND BOWEL: Stomach and small bowel are within normal limits. No obstructive or inflammatory changes of the colon are seen. The appendix has been surgically removed. There is no bowel obstruction. PERITONEUM AND RETROPERITONEUM: No ascites. No free air. VASCULATURE: Aorta is normal in caliber. LYMPH NODES: No lymphadenopathy. REPRODUCTIVE ORGANS: No acute abnormality. BONES AND SOFT TISSUES: No acute osseous abnormality is seen. Stable SI joint sclerosis is seen. Postsurgical changes in the lower lumbar spine are noted. No focal soft tissue abnormality. IMPRESSION: 1. No acute findings in the abdomen or pelvis. Electronically signed by: Oneil Devonshire MD 01/10/2025 08:05 PM EST RP Workstation: [id number]    IMPRESSION/PLAN:  64 year old male presenting with N/V with hematemesis x 2 episodes.  No abdominal pain.  CTAP with contrast did not identify any intra-abdominal/pelvic pathology to explain symptoms. - Soft diet - NPO after midnight  -  Continue PPI IV bid - Ondansetron  4 mg PR IV every 6 hours as needed - EGD 12:30 PM on 01/12/2025 with Dr. Federico. EGD benefits and risks discussed including risk with sedation, risk of bleeding, perforation and infection  - Hold Heparin  IV at 8:30 AM on 01/12/2025 - Cardiac clearance including Heparin  IV hold  obtained from cardiology  GERD.  EGD 02/2023 showed reflux esophagitis, 3 cm hiatal hernia and gastritis. - See plan above   Diarrhea x 1 episode.  GI pathogen panel and C. difficile PCR  quick screen ordered, not collected as patient has not had any further diarrhea since admission.  Rectal bleeding, likely from internal and external hemorrhoids.  Colonoscopy 02/2023 identified 1 sessile serrated polyp removed from the colon, diverticulosis to the left colon and internal/external hemorrhoids. - Apply a small amount of Desitin inside the anal opening and to the external anal area three times daily as needed for anal or hemorrhoidal irritation/bleeding.  - Anusol   25mg  suppository 1 PR nightly x 3 nights - Further management as an outpatient  New onset atrial flutter. CHA2DS2-VASc Score = 2.  On IV Heparin . - Management per cardiology   Wegener's granulomatosis and leukocytoclastic vasculitis. On Methotrexate  Prednisone  and Doxycycline  per rheumatology.  DM type II  Elida CHRISTELLA Shawl  01/11/2025, 11:41 AM  I have taken a history, reviewed the chart and examined the patient. I performed a substantive portion of this encounter (greater than 50%), including complete performance of at least one of the key components, in conjunction with the APP. I agree with the APP's note, impression and recommendations with additional input as follows.  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  This started out as dry heaves and then progressed to food-like emesis, followed by bloody emesis.  Patient did have 1 episode of diarrhea initially as well.  Denies any NSAID use and he was not on any blood thinners prior to this hospitalization. His last EGD in 02/2023 showed grade B reflux esophagitis, 3 cm hiatal hernia, and gastritis.  Upon arrival, patient was found to have leukocytosis.  Hemoglobin has remained stable.  CT A/P did not show any acute findings.  Patient was also found to be in a flutter and  thus will need to be on anticoagulation in preparation for possible cardioversion.  We will plan for EGD tomorrow for further evaluation of a source of hematemesis.  N.p.o. after midnight.  Will plan to hold IV heparin  4 hours before his procedure.  Mallory-Weiss tear is a possibility since patient did have nonbloody emesis prior to the development of bloody emesis. Alternatively esophagitis or Ole erosions could also be considered. Patient does describe some rectal bleeding but because his hemoglobin has remained stable and he has a history of hemorrhoids, this can be followed as an outpatient.  Estefana Federico, MD  "

## 2025-01-11 NOTE — Progress Notes (Signed)
 PHARMACY - ANTICOAGULATION CONSULT NOTE  Pharmacy Consult for Heparin  Indication: atrial fibrillation  Allergies[1]  Patient Measurements: Height: 6' 2 (188 cm) Weight: 92.8 kg (204 lb 8 oz) IBW/kg (Calculated) : 82.2 HEPARIN  DW (KG): 92.8  Vital Signs: Temp: 98.3 F (36.8 C) (01/28 1353) BP: 116/70 (01/28 1353) Pulse Rate: 98 (01/28 1353)  Labs: Recent Labs    01/10/25 1659 01/11/25 0540  HGB 17.3* 14.9  HCT 52.3* 45.4  PLT 339 269  HEPARINUNFRC  --  <0.10*  CREATININE 0.93 0.92    Estimated Creatinine Clearance: 93.1 mL/min (by C-G formula based on SCr of 0.92 mg/dL).   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 Not on anticoagulation PTA.  Patient reports hematemesis on admission. He does not have any significant GI history, however GI to consult.  He receives wound care on a routine basis outpatient for chronic right ankle wound.  Cardiology consulted - possible cardioversion.  Pharmacy consulted for heparin  management.  Today,  01/11/25: -Heparin  level remains <0.10 - subtherapeutic with heparin  infusing at 1400 units/hr -CBC stable (Hgb down to 14.9 from 17.3 but WNL) -Per RN, patient has hemorrhoids that bleed frequently but are not currently bleeding and no signs of bleeding hemorrhoids during GI digital exam this AM. Patient does, however, have orange-tinged urine since ~1030. No other s/sx of bleeding noted  Goal of Therapy:  Heparin  level 0.3-0.7 units/ml Monitor platelets by anticoagulation protocol: Yes   Plan:  -Increase heparin  infusion to 1700 units/hr (NO bolus given orange-tinged urine noted by RN) -Check heparin  level ~6h after rate increase -Daily heparin  level & CBC while on heparin  -Per GI, hold heparin  1/29 @0830  in preparation for EGD at 1230   Lacinda Moats, PharmD Clinical Pharmacist  1/28/20263:43 PM    [1] No Known Allergies

## 2025-01-11 NOTE — H&P (Addendum)
 " History and Physical  Atsushi Yom FMW:[id number] DOB: October 19, 1961 DOA: 01/10/2025  Referring physician: Glendia Longs, PA-EDP  PCP: Patrcia Lynwood RAMAN, MD  Outpatient Specialists: Rheumatology, wound care specialist. Patient coming from: Home.  Chief Complaint: Nausea, vomiting, diarrhea.  HPI: Shawn [name] is a 64 y.o. male with medical history significant for chronic right ankle wound after skin biopsy 2 years ago, type 2 diabetes, Wegener's granulomatosis on methotrexate , chronic prednisone  use, doxycycline , who presents to the ER due to sudden onset diarrhea while at the wound care clinic today.  States he was doing fine yesterday.  He was advised to go to the ER but opted to go home instead.  While at home he had another episode of watery stool.  Also had nausea and vomiting every 20 minutes from about 11:00 AM until about 5 PM.  He had 2 episodes of blood in the emesis at home.  Presented to the ER for further evaluation.  In the ER, nausea and vomiting persisted, vomited once.  Nausea improved after IV Zofran  and IV fluid.  While in the ER, he was found to be in atrial flutter with rapid ventricular response.  EDP discussed the case with cardiology.  Recommended admission by the medicine team, cardiology will see in consultation.  The patient was started on heparin  drip in the ER.  TRH, hospitalist service, was asked to admit.  ED Course: Temperature 98.4.  BP 126/76, pulse 82, respiration rate 15, O2 saturation 95% on room air.  Review of Systems: Review of systems as noted in the HPI. All other systems reviewed and are negative.   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-followed by Dr Revonda   Multiple thyroid  nodules    Onychomycosis    treated with lamisil in the past   Other chronic pain    Pain in right hand    Palpitations    Prediabetes    Pruritus    Retinal detachment    right eye caused blindness   Tinea cruris    Tinnitus    Total retinal detachment of right eye    Past Surgical History:  Procedure Laterality Date   APPENDECTOMY     BACK SURGERY     COLONOSCOPY     COLONOSCOPY WITH ESOPHAGOGASTRODUODENOSCOPY (EGD)  02/23/2023   CYSTOSCOPY W/ URETERAL STENT PLACEMENT  08/24/2012   Procedure: CYSTOSCOPY WITH RETROGRADE PYELOGRAM/URETERAL STENT PLACEMENT;  Surgeon: Arlena LILLETTE Gal, MD;  Location: [facility];  Service: Urology;  Laterality: Right;   CYSTOSCOPY/RETROGRADE/URETEROSCOPY  08/24/2012   Procedure: CYSTOSCOPY/RETROGRADE/URETEROSCOPY;  Surgeon: Arlena LILLETTE Gal, MD;  Location: [facility];  Service: Urology;  Laterality: Right;  Stone Basketing   CYSTOSCOPY/RETROGRADE/URETEROSCOPY/STONE EXTRACTION WITH BASKET Left 04/11/2013   Procedure: CYSTOSCOPY/RETROGRADE/URETEROSCOPY/STONE EXTRACTION WITH BASKET;  Surgeon: Arlena LILLETTE Gal, MD;  Location: [facility] [place];  Service: Urology;  Laterality: Left;  BASKET STONE EXTRACTION WITH URETEROSCOPE     Right retina detachment      Social History:  reports that he has quit smoking. His smoking use included pipe. He has never used smokeless tobacco. He reports current alcohol use. He reports that he does not use drugs.   Allergies[1]  Family History  Problem Relation Age of Onset   Diabetes Mother    Stroke Mother    Aneurysm Mother    Cancer Mother    Heart attack Father    Hypertension Father    CAD Father    Heart disease Father    Bladder  Cancer Brother    Diabetes Maternal Grandmother    Hypertension Maternal Grandmother    Aneurysm Paternal Grandmother    Heart attack Paternal Grandfather    Hypertension Paternal Grandfather    Thyroid  disease Neg Hx       Prior to Admission medications  Medication Sig Start Date End Date Taking? Authorizing Provider  clobetasol  cream (TEMOVATE ) 0.05 % 1 Application. 02/20/24  Yes [provider]  doxycycline  (VIBRA -TABS) 100 MG tablet Take 100 mg by mouth daily. 08/31/24  Yes [provider]  folic acid  (FOLVITE ) 1 MG tablet Take 1 mg by mouth daily. 07/01/24  Yes [provider]  gabapentin  (NEURONTIN ) 300 MG capsule Take by mouth. Take 1 tab in the morning and 2 tabs in the evening 01/22/22  Yes [provider]  glipiZIDE (GLUCOTROL) 5 MG tablet Take 5 mg by mouth daily.   Yes [provider]  HYDROcodone -acetaminophen  (NORCO/VICODIN) 5-325 MG tablet Take 1 tablet by mouth every 8 (eight) hours as needed. 07/12/24  Yes [provider]  methotrexate  (RHEUMATREX) 2.5 MG tablet Take 15 mg by mouth once a week. Caution:Chemotherapy. Protect from light.   Yes [provider]  metoprolol  succinate (TOPROL -XL) 50 MG 24 hr tablet Take 1 tablet (50 mg total) by mouth daily. 10/24/22  Yes Chandrasekhar, Mahesh A, MD  pantoprazole  (PROTONIX ) 40 MG tablet TAKE 1 TABLET (40 MG TOTAL) BY MOUTH TWICE A DAY BEFORE MEALS 10/16/23  Yes Nandigam, Kavitha V, MD  predniSONE  (DELTASONE ) 10 MG tablet Take 10 mg by mouth daily. 06/30/24  Yes [provider]  betamethasone dipropionate 0.05 % cream Apply topically. Patient not taking: Reported on 01/10/2025 01/29/24   [provider]  doxycycline  (DORYX ) 100 MG EC tablet Take 100 mg by mouth 2 (two) times daily. Patient not taking: Reported on 01/10/2025 07/14/24   [provider]  mupirocin ointment (BACTROBAN) 2 % Apply 1 Application topically 2 (two) times daily. To  nostrils Patient not taking: Reported on 01/10/2025 06/21/24   [provider]  tadalafil  (CIALIS ) 5 MG tablet Take 5 mg by mouth. Patient not taking: Reported on 01/10/2025 12/04/22   [provider]  testosterone  cypionate (DEPOTESTOSTERONE CYPIONATE) 200 MG/ML injection INJECT 1.5 MLS INTO THE MUSCLE EVERY 14 DAYS *DISCARD REMAINDER OF OPEN VIAL AFTER EACH DOSE* 12/07/24   Stoioff, Glendia BROCKS, MD    Physical Exam: BP 124/88 (BP Location: Left Arm)   Pulse 85   Temp 98.4 F (36.9 C) (Oral)   Resp 16   Ht 6' 2 (1.88 m)   Wt 92.8 kg   SpO2 99%   BMI 26.26 kg/m   General: 64 y.o. year-old male well developed well nourished in no acute distress.  Alert and oriented x3. Cardiovascular: Regular rate and rhythm with no rubs or gallops.  No thyromegaly or  JVD noted.  No lower extremity edema. 2/4 pulses in all 4 extremities. Respiratory: Clear to auscultation with no wheezes or rales. Good inspiratory effort. Abdomen: Soft nontender nondistended with normal bowel sounds x4 quadrants. Muskuloskeletal: No cyanosis, clubbing or edema noted bilaterally Neuro: CN II-XII intact, strength, sensation, reflexes Skin: No ulcerative lesions noted or rashes Psychiatry: Judgement and insight appear normal. Mood is appropriate for condition and setting          Labs on Admission:  Basic Metabolic Panel: Recent Labs  Lab 01/10/25 1659  NA 131*  K 4.7  CL 97*  CO2 21*  GLUCOSE 216*  BUN 20  CREATININE 0.93  CALCIUM 9.4   Liver Function Tests: Recent Labs  Lab 01/10/25 1659  AST 21  ALT 19  ALKPHOS 57  BILITOT 1.6*  PROT 7.8  ALBUMIN 4.6   Recent Labs  Lab 01/10/25 1659  LIPASE 69*   No results for input(s): AMMONIA in the last 168 hours. CBC: Recent Labs  Lab 01/10/25 1659  WBC 18.2*  NEUTROABS 15.9*  HGB 17.3*  HCT 52.3*  MCV 95.6  PLT 339   Cardiac Enzymes: No results for input(s): CKTOTAL, CKMB, CKMBINDEX, TROPONINI in the last 168  hours.  BNP (last 3 results) No results for input(s): BNP in the last 8760 hours.  ProBNP (last 3 results) No results for input(s): PROBNP in the last 8760 hours.  CBG: Recent Labs  Lab 01/10/25 1139  GLUCAP 139*    Radiological Exams on Admission: CT ABDOMEN PELVIS W CONTRAST Result Date: 01/10/2025 EXAM: CT ABDOMEN AND PELVIS WITH CONTRAST 01/10/2025 07:50:52 PM TECHNIQUE: CT of the abdomen and pelvis was performed with the administration of 100 mL of iohexol  (OMNIPAQUE ) 300 MG/ML solution. Multiplanar reformatted images are provided for review. Automated exposure control, iterative reconstruction, and/or weight-based adjustment of the mA/kV was utilized to reduce the radiation dose to as low as reasonably achievable. COMPARISON: Comparison study 02/11/2023. CLINICAL HISTORY: Abdominal pain and diarrhea. FINDINGS: LOWER CHEST: No acute abnormality. LIVER: Fatty infiltration of the liver is noted. GALLBLADDER AND BILE DUCTS: The gallbladder is well distended. No dependent gallstones are seen. No biliary ductal dilatation. SPLEEN: The spleen appears within normal limits. PANCREAS: No acute abnormality. ADRENAL GLANDS: No acute abnormality. KIDNEYS, URETERS AND BLADDER: The kidneys demonstrate no renal calculi or obstructive changes. Normal excretion is noted on delayed images. No perinephric or periureteral stranding. The bladder is well distended. GI AND BOWEL: Stomach and small bowel are within normal limits. No obstructive or inflammatory changes of the colon are seen. The appendix has been surgically removed. There is no bowel obstruction. PERITONEUM AND RETROPERITONEUM: No ascites. No free air. VASCULATURE: Aorta is normal in caliber. LYMPH NODES: No lymphadenopathy. REPRODUCTIVE ORGANS: No acute abnormality. BONES AND SOFT TISSUES: No acute osseous abnormality is seen. Stable SI joint sclerosis is seen. Postsurgical changes in the lower lumbar spine are noted. No focal soft tissue  abnormality. IMPRESSION: 1. No acute findings in the abdomen or pelvis. Electronically signed by: Oneil Devonshire MD 01/10/2025 08:05 PM EST RP Workstation: [id number]    EKG: I independently viewed the EKG done and my findings are as followed: Atrial flutter 113.  QTc 453.  Assessment/Plan Present on Admission:  Atrial flutter (HCC)  Principal Problem:   Atrial flutter (HCC)  Atrial flutter with rapid ventricular response Lopressor  12.5 mg twice daily for rate control Heparin  drip for primary CVA prevention Follow transthoracic echocardiogram and TSH Cardiology consulted by EDP. N.p.o. until seen by cardiology  for possible cardioversion  Nausea vomiting diarrhea x 1 day 2 episodes of blood in emesis On chronic doxycycline  History of esophagitis seen on EGD [facility] consulted by EDP Continue IV PPI Protonix  twice daily Continue as needed IV antiemetics Continue gentle IV fluid hydration LR at 50 cc/h x 1 day Check for C. difficile if recurrent diarrhea  Hypertension BP is at goal 126/76 P.o. Lopressor  12.5 mg twice daily Closely monitor vital signs  Wegener's granulomatosis Resume home regimen On methotrexate  weekly, daily folic acid , prednisone , and daily doxycycline  prior to admission  Leukocytosis, possibly reactive Presented with WBC 18.2. On chronic steroid, possibly reactive. However if diarrhea recurs check for C. difficile.  Type 2 diabetes with hyperglycemia Obtain hemoglobin A1c Insulin  coverage   Time: 75 minutes.   DVT prophylaxis: Heparin  drip  Code Status: Full code.  Family Communication: None at bedside.  Disposition Plan: Admitted to progressive care unit.  Consults called: Cardiology and  GI consulted by EDP.  Admission status: Observation status.   Status is: Observation    Terry LOISE Hurst MD [facility] Pager [phone]  If 7PM-7AM, please contact night-coverage [email or web address] Password [email or web address]  01/11/2025, 12:04 AM       [1] No Known Allergies  "

## 2025-01-11 NOTE — Progress Notes (Signed)
 Echocardiogram 2D Echocardiogram has been performed.  Shawn [name] 01/11/2025, 8:28 AM

## 2025-01-11 NOTE — Care Management Obs Status (Signed)
 MEDICARE OBSERVATION STATUS NOTIFICATION   Patient Details  Name: Shawn [name] MRN: [id number] Date of Birth: 03-08-1961   Medicare Observation Status Notification Given:  Yes    [facility] Service, RN 01/11/2025, 3:48 PM

## 2025-01-11 NOTE — Consult Note (Addendum)
 "  Cardiology Consultation   Patient ID: Shawn [name] MRN: [id number]; DOB: March 26, 1961  Admit date: 01/10/2025 Date of Consult: 01/11/2025  PCP:  Patrcia Lynwood RAMAN, MD   [facility] Providers Cardiologist:  Stanly DELENA Leavens, MD        Patient Profile: Shawn [name] is a 64 y.o. male with a hx of atrial tachycardia, PVCs, hypertension, MGUS, bilateral sensorineural hearing loss,, and GERD who is being seen 01/11/2025 for the evaluation of Atrial fibrillation at the request of Toribio Hummer.  History of Present Illness: Shawn [name] is a 64 year old male with prior cardiac history listed below.  A TTE was performed on 06/2022 and showed a normal LVEF of 55 to 60%, no RWMA, normal RV systolic function, grossly normal valve function, and a small echodensity in the left atrium that was suspected to be artifact  The patient had a cardiac MRI performed on 11/2022 that showed no evidence of cardiac amyloidosis.  The cardiac MRI showed a normal LVEF of 56%, and that the left atrial echodensity is likely a prominent Coumadin ridge (a normal variant structure)  On 01/2023 the patient was seen for concerns of atrial tachycardia and presyncope.  Because of this a 14-day ZIO monitor was ordered and showed rare PACs less than 1%, and occasional PVCs about 2.4%.  Patient's triggered symptoms were associated with sinus rhythm and sinus tachycardia.  Patient presented to the emergency department for abdominal pain, nausea, and vomiting.  The patient also reported having some hematemesis.  An EKG was done and showed that the patient was in atrial flutter.  Patient was started on IV heparin  and metoprolol  tartrate 12.5 mg twice daily.  Patient's blood pressure has been stable.  Most recent BP was 126/76.  On 02/2023 the patient had an EGD performed that showed grade B reflux esophagitis, and 3 cm hiatal hernia.  On interview patient reported that yesterday morning he felt sick, had hot flashes  and felt lightheaded.  Reported that from 1130 to about 4 PM yesterday he had multiple episodes of vomiting.  2 of these episodes look like bright red blood.  Also reported having dark brown.  Reported snoring, daytime somnolence, and poor sleep quality.  Drinks 1 glass of wine every 2 to 3 months.  Denies nicotine use or illicit substance use.  Denies any symptoms with atrial fibrillation such as chest pain, chest tightness, shortness of breath, fatigue, or palpitations.  Labs showed potassium of 4.3, creatinine of 0.92, sodium of 137, BUN of 14, magnesium of 2.2, WBC count of 10.7, hemoglobin of 14.9, and decreased TSH of 0.171.  EKG showed atrial flutter with a ventricular rate of 113 and variable AV node conduction.  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-followed  by Dr Revonda   Multiple thyroid  nodules    Onychomycosis    treated with lamisil in the past   Other chronic pain    Pain in right hand    Palpitations    Prediabetes    Pruritus    Retinal detachment    right eye caused blindness   Tinea cruris    Tinnitus    Total retinal detachment of right eye     Past Surgical History:  Procedure Laterality Date   APPENDECTOMY     BACK SURGERY     COLONOSCOPY     COLONOSCOPY WITH ESOPHAGOGASTRODUODENOSCOPY (EGD)  02/23/2023   CYSTOSCOPY W/ URETERAL STENT PLACEMENT  08/24/2012    Procedure: CYSTOSCOPY WITH RETROGRADE PYELOGRAM/URETERAL STENT PLACEMENT;  Surgeon: Arlena LILLETTE Gal, MD;  Location: [facility];  Service: Urology;  Laterality: Right;   CYSTOSCOPY/RETROGRADE/URETEROSCOPY  08/24/2012   Procedure: CYSTOSCOPY/RETROGRADE/URETEROSCOPY;  Surgeon: Arlena LILLETTE Gal, MD;  Location: [facility];  Service: Urology;  Laterality: Right;  Stone Basketing   CYSTOSCOPY/RETROGRADE/URETEROSCOPY/STONE EXTRACTION WITH BASKET Left 04/11/2013   Procedure: CYSTOSCOPY/RETROGRADE/URETEROSCOPY/STONE EXTRACTION WITH BASKET;  Surgeon: Arlena LILLETTE Gal, MD;  Location: [facility];  Service: Urology;  Laterality: Left;  BASKET STONE EXTRACTION WITH URETEROSCOPE     Right retina detachment       Home Medications:  Prior to Admission medications  Medication Sig Start Date End Date Taking? Authorizing Provider  clobetasol  cream (TEMOVATE ) 0.05 % 1 Application. 02/20/24  Yes [provider]  doxycycline  (VIBRA -TABS) 100 MG tablet Take 100 mg by mouth daily. 08/31/24  Yes [provider]  folic acid  (FOLVITE ) 1 MG tablet Take 1 mg by mouth daily. 07/01/24  Yes [provider]  gabapentin  (NEURONTIN ) 300 MG capsule Take by mouth. Take 1 tab in the morning and 2 tabs in the evening 01/22/22  Yes [provider]  glipiZIDE (GLUCOTROL) 5 MG tablet Take 5 mg by mouth daily.   Yes [provider]  HYDROcodone -acetaminophen  (NORCO/VICODIN) 5-325 MG tablet Take 1 tablet by mouth every 8 (eight) hours as needed. 07/12/24  Yes [provider]  methotrexate  (RHEUMATREX) 2.5 MG tablet Take 15 mg by mouth once a week. Caution:Chemotherapy. Protect from light.   Yes [provider]  metoprolol  succinate (TOPROL -XL) 50 MG 24 hr tablet Take 1 tablet (50 mg total) by mouth daily. 10/24/22  Yes Chandrasekhar, Mahesh A, MD  pantoprazole  (PROTONIX ) 40 MG tablet TAKE 1 TABLET (40 MG TOTAL) BY MOUTH TWICE A DAY BEFORE MEALS 10/16/23  Yes  Nandigam, Kavitha V, MD  predniSONE  (DELTASONE ) 10 MG tablet Take 10 mg by mouth daily. 06/30/24  Yes [provider]  betamethasone dipropionate 0.05 % cream Apply topically. Patient not taking: Reported on 01/10/2025 01/29/24   [provider]  doxycycline  (DORYX ) 100 MG EC tablet Take 100 mg by mouth 2 (two) times daily. Patient not taking: Reported on 01/10/2025 07/14/24   [provider]  mupirocin ointment (BACTROBAN) 2 % Apply 1 Application topically 2 (two) times daily. To nostrils Patient not taking: Reported on 01/10/2025 06/21/24   [provider]  tadalafil  (CIALIS ) 5 MG tablet Take 5 mg by mouth. Patient not taking: Reported on 01/10/2025 12/04/22   [provider]  testosterone  cypionate (DEPOTESTOSTERONE CYPIONATE) 200 MG/ML injection INJECT 1.5 MLS INTO THE MUSCLE EVERY 14 DAYS *DISCARD REMAINDER OF OPEN VIAL AFTER EACH DOSE* 12/07/24   Stoioff, Glendia BROCKS, MD    Scheduled Meds:  clobetasol  ointment  1 Application Topical Daily  doxycycline   100 mg Oral Daily   folic acid   1 mg Oral Daily   gabapentin   300 mg Oral Daily   And   gabapentin   600 mg Oral QHS   insulin  aspart  0-9 Units Subcutaneous Q4H   [START ON 01/14/2025] methotrexate   15 mg Oral Weekly   metoprolol  tartrate  12.5 mg Oral BID   pantoprazole  (PROTONIX ) IV  40 mg Intravenous BID   predniSONE   10 mg Oral Q lunch   saccharomyces boulardii  250 mg Oral BID   triamcinolone  ointment  1 Application Topical Daily   Continuous Infusions:  heparin  1,400 Units/hr (01/11/25 0740)   lactated ringers  50 mL/hr at 01/11/25 0330   PRN Meds: acetaminophen , melatonin, mouth rinse, oxyCODONE , prochlorperazine   Allergies:   Allergies[1]  Social History:   Social History   Socioeconomic History   Marital status: Married    Spouse name: Not on file   Number of children: Not on file   Years of education: Not on file   Highest education level: Not on file  Occupational History    Not on file  Tobacco Use   Smoking status: Former    Types: Pipe   Smokeless tobacco: Never  Vaping Use   Vaping status: Never Used  Substance and Sexual Activity   Alcohol use: Yes    Comment: ocassionally beer or wine   Drug use: No   Sexual activity: Never  Other Topics Concern   Not on file  Social History Narrative   Not on file   Social Drivers of Health   Tobacco Use: Medium Risk (01/11/2025)   Patient History    Smoking Tobacco Use: Former    Smokeless Tobacco Use: Never    Passive Exposure: Not on file  Financial Resource Strain: Low Risk (05/13/2024)   Received from [facility]   Overall Financial Resource Strain (CARDIA)    Difficulty of Paying Living Expenses: Not hard at all  Food Insecurity: No Food Insecurity (01/11/2025)   Epic    Worried About Radiation Protection Practitioner of Food in the Last Year: Never true    Ran Out of Food in the Last Year: Never true  Transportation Needs: No Transportation Needs (01/11/2025)   Epic    Lack of Transportation (Medical): No    Lack of Transportation (Non-Medical): No  Physical Activity: Not on file  Stress: Not on file  Social Connections: Socially Integrated (01/11/2025)   Social Connection and Isolation Panel    Frequency of Communication with Friends and Family: Twice a week    Frequency of Social Gatherings with Friends and Family: Twice a week    Attends Religious Services: 1 to 4 times per year    Active Member of [employer] or Organizations: No    Attends Banker Meetings: 1 to 4 times per year    Marital Status: Married  Catering Manager Violence: Not At Risk (01/11/2025)   Epic    Fear of Current or Ex-Partner: No    Emotionally Abused: No    Physically Abused: No    Sexually Abused: No  Depression (PHQ2-9): Not on file  Alcohol Screen: Not on file  Housing: Low Risk (01/11/2025)   Epic    Unable to Pay for Housing in the Last Year: No    Number of Times Moved in the Last Year: 0    Homeless in the Last Year:  No  Utilities: Not At Risk (01/11/2025)   Epic    Threatened with loss  of utilities: No  Health Literacy: Not on file    Family History:    Family History  Problem Relation Age of Onset   Diabetes Mother    Stroke Mother    Aneurysm Mother    Cancer Mother    Heart attack Father    Hypertension Father    CAD Father    Heart disease Father    Bladder Cancer Brother    Diabetes Maternal Grandmother    Hypertension Maternal Grandmother    Aneurysm Paternal Grandmother    Heart attack Paternal Grandfather    Hypertension Paternal Grandfather    Thyroid  disease Neg Hx      ROS:  Please see the history of present illness.   All other ROS reviewed and negative.     Physical Exam/Data: Vitals:   01/10/25 2230 01/10/25 2300 01/10/25 2317 01/11/25 0337  BP: 136/86 (!) 134/91 124/88 126/76  Pulse: (!) 111 (!) 117 85 82  Resp: (!) 26 19 16 15   Temp:   98.4 F (36.9 C) 98.4 F (36.9 C)  TempSrc:   Oral Oral  SpO2: 98% 98% 99% 95%  Weight:   92.8 kg   Height:   6' 2 (1.88 m)     Intake/Output Summary (Last 24 hours) at 01/11/2025 1124 Last data filed at 01/11/2025 1030 Gross per 24 hour  Intake 2312.36 ml  Output 900 ml  Net 1412.36 ml      01/10/2025   11:17 PM 07/29/2024    6:08 AM 06/08/2024    1:42 PM  Last 3 Weights  Weight (lbs) 204 lb 8 oz 204 lb 204 lb  Weight (kg) 92.761 kg 92.534 kg 92.534 kg     Body mass index is 26.26 kg/m.  General:  Well nourished, well developed, in no acute distress.  Alert and orientated on room air HEENT: normal Neck: no JVD Vascular: No carotid bruits; Distal pulses 2+ bilaterally Cardiac:  normal S1, S2; iRRR; no murmur  Lungs:  clear to auscultation bilaterally, no wheezing, rhonchi or rales  Abd: soft, nontender, no hepatomegaly  Ext: no edema Musculoskeletal:  No deformities Skin: warm and dry  Neuro:   no focal abnormalities noted Psych:  Normal affect   EKG:  The EKG was personally reviewed and demonstrates:  atrial  flutter with a ventricular rate of 113 and variable AV node conduction. Telemetry:  Telemetry was personally reviewed and demonstrates: Atrial fibrillation with heart rates in the 80s to 110s  Relevant CV Studies: TTE Pending  Laboratory Data: High Sensitivity Troponin:  No results for input(s): TROPONINIHS in the last 720 hours.  Recent Labs  Lab 01/10/25 1815 01/10/25 2041  TRNPT 24* 38*      Chemistry Recent Labs  Lab 01/10/25 1659 01/11/25 0540  NA 131* 137  K 4.7 4.3  CL 97* 104  CO2 21* 24  GLUCOSE 216* 95  BUN 20 14  CREATININE 0.93 0.92  CALCIUM 9.4 8.4*  MG  --  2.2  GFRNONAA >60 >60  ANIONGAP 13 10    Recent Labs  Lab 01/10/25 1659  PROT 7.8  ALBUMIN 4.6  AST 21  ALT 19  ALKPHOS 57  BILITOT 1.6*   Lipids No results for input(s): CHOL, TRIG, HDL, LABVLDL, LDLCALC, CHOLHDL in the last 168 hours.  Hematology Recent Labs  Lab 01/10/25 1659 01/11/25 0540  WBC 18.2* 10.7*  RBC 5.47 4.64  HGB 17.3* 14.9  HCT 52.3* 45.4  MCV 95.6 97.8  MCH 31.6 32.1  MCHC 33.1 32.8  RDW 15.0 15.2  PLT 339 269   Thyroid   Recent Labs  Lab 01/11/25 0540  TSH 0.171*    BNPNo results for input(s): BNP, PROBNP in the last 168 hours.  DDimer No results for input(s): DDIMER in the last 168 hours.  Radiology/Studies:  ECHOCARDIOGRAM COMPLETE Result Date: 01/11/2025    ECHOCARDIOGRAM REPORT   Patient Name:   Shawn [name] Date of Exam: 01/11/2025 Medical Rec #:  [id number]     Height:       74.0 in Accession #:    [id number]    Weight:       204.5 lb Date of Birth:  [birth year]    BSA:          2.194 m Patient Age:    63 years      BP:           126/76 mmHg Patient Gender: M             HR:           80 bpm. Exam Location:  Inpatient Procedure: 2D Echo, Cardiac Doppler and Color Doppler (Both Spectral and Color            Flow Doppler were utilized during procedure). Indications:    I48.92* Unspecified atrial flutter  History:        Patient has prior  history of Echocardiogram examinations, most                 recent 07/04/2022. Risk Factors:Former Smoker and Hypertension.  Sonographer:    Merlynn Argyle Referring Phys: [id number] CAROLE N HALL IMPRESSIONS  1. Left ventricular ejection fraction, by estimation, is 55 to 60%. The left ventricle has normal function. The left ventricle has no regional wall motion abnormalities. There is mild left ventricular hypertrophy. Left ventricular diastolic parameters are indeterminate.  2. Right ventricular systolic function is normal. The right ventricular size is normal. There is normal pulmonary artery systolic pressure.  3. The mitral valve is normal in structure. Trivial mitral valve regurgitation. No evidence of mitral stenosis.  4. The aortic valve is tricuspid. There is mild calcification of the aortic valve. Aortic valve regurgitation is not visualized. Aortic valve sclerosis/calcification is present, without any evidence of aortic stenosis.  5. Aortic dilatation noted. There is borderline dilatation of the aortic root, measuring 38 mm. There is borderline dilatation of the ascending aorta, measuring 38 mm.  6. The inferior vena cava is dilated in size with >50% respiratory variability, suggesting right atrial pressure of 8 mmHg. FINDINGS  Left Ventricle: Left ventricular ejection fraction, by estimation, is 55 to 60%. The left ventricle has normal function. The left ventricle has no regional wall motion abnormalities. The left ventricular internal cavity size was normal in size. There is  mild left ventricular hypertrophy. Left ventricular diastolic parameters are indeterminate. Right Ventricle: The right ventricular size is normal. No increase in right ventricular wall thickness. Right ventricular systolic function is normal. There is normal pulmonary artery systolic pressure. The tricuspid regurgitant velocity is 2.01 m/s, and  with an assumed right atrial pressure of 8 mmHg, the estimated right ventricular systolic  pressure is 24.2 mmHg. Left Atrium: Left atrial size was normal in size. Right Atrium: Right atrial size was normal in size. Pericardium: There is no evidence of pericardial effusion. Mitral Valve: The mitral valve is normal in structure. Trivial mitral valve regurgitation. No evidence of mitral valve stenosis. Tricuspid Valve: The tricuspid valve is normal in structure.  Tricuspid valve regurgitation is trivial. No evidence of tricuspid stenosis. Aortic Valve: The aortic valve is tricuspid. There is mild calcification of the aortic valve. Aortic valve regurgitation is not visualized. Aortic valve sclerosis/calcification is present, without any evidence of aortic stenosis. Pulmonic Valve: The pulmonic valve was normal in structure. Pulmonic valve regurgitation is trivial. No evidence of pulmonic stenosis. Aorta: Aortic dilatation noted. There is borderline dilatation of the aortic root, measuring 38 mm. There is borderline dilatation of the ascending aorta, measuring 38 mm. Venous: The inferior vena cava is dilated in size with greater than 50% respiratory variability, suggesting right atrial pressure of 8 mmHg. IAS/Shunts: No atrial level shunt detected by color flow Doppler.  LEFT VENTRICLE PLAX 2D LVIDd:         5.00 cm LVIDs:         3.60 cm LV PW:         1.20 cm LV IVS:        1.20 cm LVOT diam:     2.20 cm LV SV:         77 LV SV Index:   35 LVOT Area:     3.80 cm  RIGHT VENTRICLE             IVC RV Basal diam:  3.70 cm     IVC diam: 2.20 cm RV S prime:     14.80 cm/s TAPSE (M-mode): 1.8 cm LEFT ATRIUM             Index        RIGHT ATRIUM           Index LA diam:        3.70 cm 1.69 cm/m   RA Area:     11.00 cm LA Vol (A2C):   39.2 ml 17.87 ml/m  RA Volume:   19.10 ml  8.71 ml/m LA Vol (A4C):   43.5 ml 19.83 ml/m LA Biplane Vol: 43.2 ml 19.69 ml/m  AORTIC VALVE LVOT Vmax:   109.00 cm/s LVOT Vmean:  76.400 cm/s LVOT VTI:    0.203 m  AORTA Ao Root diam: 3.80 cm Ao Asc diam:  3.80 cm TRICUSPID VALVE TR  Peak grad:   16.2 mmHg TR Vmax:        201.00 cm/s  SHUNTS Systemic VTI:  0.20 m Systemic Diam: 2.20 cm Toribio Fuel MD Electronically signed by Toribio Fuel MD Signature Date/Time: 01/11/2025/8:41:17 AM    Final    CT ABDOMEN PELVIS W CONTRAST Result Date: 01/10/2025 EXAM: CT ABDOMEN AND PELVIS WITH CONTRAST 01/10/2025 07:50:52 PM TECHNIQUE: CT of the abdomen and pelvis was performed with the administration of 100 mL of iohexol  (OMNIPAQUE ) 300 MG/ML solution. Multiplanar reformatted images are provided for review. Automated exposure control, iterative reconstruction, and/or weight-based adjustment of the mA/kV was utilized to reduce the radiation dose to as low as reasonably achievable. COMPARISON: Comparison study 02/11/2023. CLINICAL HISTORY: Abdominal pain and diarrhea. FINDINGS: LOWER CHEST: No acute abnormality. LIVER: Fatty infiltration of the liver is noted. GALLBLADDER AND BILE DUCTS: The gallbladder is well distended. No dependent gallstones are seen. No biliary ductal dilatation. SPLEEN: The spleen appears within normal limits. PANCREAS: No acute abnormality. ADRENAL GLANDS: No acute abnormality. KIDNEYS, URETERS AND BLADDER: The kidneys demonstrate no renal calculi or obstructive changes. Normal excretion is noted on delayed images. No perinephric or periureteral stranding. The bladder is well distended. GI AND BOWEL: Stomach and small bowel are within normal limits. No obstructive or inflammatory changes of the  colon are seen. The appendix has been surgically removed. There is no bowel obstruction. PERITONEUM AND RETROPERITONEUM: No ascites. No free air. VASCULATURE: Aorta is normal in caliber. LYMPH NODES: No lymphadenopathy. REPRODUCTIVE ORGANS: No acute abnormality. BONES AND SOFT TISSUES: No acute osseous abnormality is seen. Stable SI joint sclerosis is seen. Postsurgical changes in the lower lumbar spine are noted. No focal soft tissue abnormality. IMPRESSION: 1. No acute findings in the  abdomen or pelvis. Electronically signed by: Oneil Devonshire MD 01/10/2025 08:05 PM EST RP Workstation: [id number]     Assessment and Plan:  Shawn [name] is a 64 y.o. male with a hx of atrial tachycardia, PVCs, hypertension, MGUS, bilateral sensorineural hearing loss,, and GERD who is being seen 01/11/2025 for the evaluation of Atrial fibrillation at the request of Toribio Hummer.  Nausea Vomiting Hematemesis On interview patient reported that yesterday morning he felt sick, had hot flashes and felt lightheaded.  Reported that from 1130 to about 4 PM yesterday he had multiple episodes of vomiting.  2 of these episodes look like bright red blood.  Denies melanotic stools.  Patient reported that he felt like it was a significant amount of blood in the vomit. CT abdomen pelvis showed no acute findings in the abdomen or pelvis  GI consulting for hematemesis.   New onset atrial fibrillation. Click Here to Calculate/Change CHADS2VASc Score The patient's CHADS2-VASc score is 3, indicating a 3.2% annual risk of stroke.   CHF History: No HTN History: Yes Diabetes History: Yes Stroke History: No Vascular Disease History: No     Denies any symptoms with atrial fibrillation such as chest pain, chest tightness, shortness of breath, fatigue, or palpitations. Reported snoring, daytime somnolence, and poor sleep quality. Stop bang 4.  Patient was started on IV heparin  and metoprolol  tartrate 12.5 mg twice daily.  Telemetry shows rate controlled atrial fibrillation with heart rate in the 80s to 110's TTE showed normal LVEF of 55 to 60%, mild LVH, normal RV systolic function, mild aortic valve calcification with normal function, and borderline aortic root dilation. Labs showed potassium of 4.3, creatinine of 0.92, magnesium of 2.2, and TSH With patient reporting recent vomiting and hematemesis I am concerned to do a TEE cardioversion as that will commit the patient to 4 weeks of anticoagulation.  T4  pending. Primary is working this up. Recommend outpatient sleep study. May transition to Eliquis  5mg  BID when cleared to do so by GI. Plan to schedule outpatient follow-up with A-fib clinic.  May consider cardioversion at that time if has not had reoccurrence of vomiting or hematemesis.  For questions or updates, please contact [facility] Please consult [email or web address] for contact info under    Signed, ZANE ADAMS PA-C   ADDENDUM:   Patient seen and examined with Zane Adams PA-C.  I personally taken a history, examined the patient, reviewed relevant notes,  laboratory data / imaging studies.  I performed a substantive portion of this encounter and formulated the important aspects of the plan.  I agree with the APP's note, impression, and recommendations; however, I have edited the note to reflect changes or salient points.   Patient seen and examined at bedside. Accompanied by his wife. No active chest pain or heart failure symptoms. Cardiology consulted during this hospitalization as he was noted to have atrial flutter on EKG.  Started on AV nodal blocking agents as well as IV heparin .  Patient does not feel any palpitations or symptoms associated with his atrial flutter. Prior to the  hospitalization he notes having episodes of emesis with blood enough to fill 2 shot glasses..  He does also have hemorrhoidal bleeding but denies melanotic stools.  No prior history of brain bleeds.  Patient has not been on anticoagulation in the past.  No prior history of coronary artery disease, PCI/CABG, strokes/TIA, DVT/PE.  Wife states that because of prednisone  he was newly diagnosed with diabetes.  No structured exercise program but able to split wood, go up flights of stairs, yard work during summer months.  He has chronic dyspnea which he describes as congestion.  Patient also endorses having a thyroid  nodule.  PHYSICAL EXAM: Today's Vitals   01/11/25 0316 01/11/25 0337 01/11/25 0401  01/11/25 1110  BP:  126/76    Pulse:  82    Resp:  15    Temp:  98.4 F (36.9 C)    TempSrc:  Oral    SpO2:  95%    Weight:      Height:      PainSc: 3   Asleep 0-No pain   Body mass index is 26.26 kg/m.   Net IO Since Admission: 1,412.36 mL [01/11/25 1124]  Filed Weights   01/10/25 2317  Weight: 92.8 kg    General: Age-appropriate, hemodynamically stable, no acute distress HEENT: Normocephalic, atraumatic, cochlear implant, no JVP Lungs: Clear to auscultation bilaterally, no wheezes rales or rhonchi's Heart: Irregularly irregular, positive S1-S2, no murmurs rubs or gallops appreciated Abdomen: Soft, nontender, nondistended, positive bowel sounds in all 4 quadrants Extremities: Prior wound in the right lower extremity Kerlix applied, no swelling, warm to touch Neuro: Moves all 4 extremities Psych: Cooperative and appropriate  EKG: (personally reviewed by me) 01/10/2025: Atrial flutter, 113 bpm  Telemetry: (personally reviewed by me) Atrial flutter with controlled ventricular rate   Impression & Recommendations: :  Newly discovered atrial flutter. Hematemesis and hematochezia Preprocedural cardiovascular examination Hypertension. MGUS. Bilateral sensorineural hearing loss  Cardiology consulted for newly discovered atrial flutter diagnoses. Hemodynamically stable. Currently on IV heparin  drip for thromboembolic prophylaxis. On Lopressor  12.5 mg p.o. twice daily for rate control strategy Discussed the risks, benefits, and alternatives to oral anticoagulation.  Given the fact that he is endorsing hematemesis GI has been consulted for further evaluation to see if he is a candidate for oral anticoagulation at the time of discharge.  Will await GI clearance prior to transitioning to oral anticoagulation.  Would recommend rate control strategy for now to see how he does in the next 4 weeks.  Would recommend close outpatient follow-up with either primary cardiology or atrial  fibrillation clinic.  If within 4 weeks he is not back in normal rhythm and he is able to tolerate anticoagulation then cardioversion should be considered to restore sinus rhythm.  I would be hesitant to cardiovert him during this hospitalization due to his concerns for hematemesis and he also has hemorrhoidal bleeding.  Echocardiogram today notes preserved LVEF, no significant valvular heart disease, patient is able to produce more than 4 METs of activity level.  From a cardiology standpoint he is acceptable risk for upcoming endoscopy.  Recommendations conveyed to both patient and wife at bedside.  Also discussed care with GI APP who was also evaluating the patient at the time of rounds.  Continue telemetry and uptitrate AV nodal blocking agents based on hemodynamics and ventricular rate.   Medical Decision:  Complexity: High -new diagnosis of atrial flutter, concerns for GI bleed, discussion for thromboembolic prophylaxis Interdisciplinary: Yes -  Independently reviewed: EKG, telemetry, vital  signs, echocardiogram, prior MRI results Prescription drug management -recommendations stated above Family updated: Wife updated  Further recommendations to follow as the case evolves.   This note was created using a voice recognition software as a result there may be grammatical errors inadvertently enclosed that do not reflect the nature of this encounter. Every attempt is made to correct such errors.   Madonna Michele HAS, [facility]  A Division of [facility] [address], [place], [place] [address]  Pager: [phone] Office: [phone] 01/11/2025 11:24 AM       [1] No Known Allergies  "

## 2025-01-12 ENCOUNTER — Observation Stay (HOSPITAL_COMMUNITY): Admitting: Anesthesiology

## 2025-01-12 ENCOUNTER — Encounter (HOSPITAL_COMMUNITY): Payer: Self-pay | Admitting: Internal Medicine

## 2025-01-12 ENCOUNTER — Telehealth (HOSPITAL_COMMUNITY): Payer: Self-pay | Admitting: Pharmacy Technician

## 2025-01-12 ENCOUNTER — Encounter (HOSPITAL_COMMUNITY)
Admission: EM | Disposition: A | Discharge status: Home / Self Care | Payer: Self-pay | Source: Home / Self Care | Attending: Internal Medicine

## 2025-01-12 ENCOUNTER — Other Ambulatory Visit (HOSPITAL_COMMUNITY): Payer: Self-pay

## 2025-01-12 DIAGNOSIS — R112 Nausea with vomiting, unspecified: Secondary | ICD-10-CM

## 2025-01-12 DIAGNOSIS — K2950 Unspecified chronic gastritis without bleeding: Secondary | ICD-10-CM | POA: Diagnosis not present

## 2025-01-12 DIAGNOSIS — R350 Frequency of micturition: Secondary | ICD-10-CM | POA: Diagnosis not present

## 2025-01-12 DIAGNOSIS — N390 Urinary tract infection, site not specified: Secondary | ICD-10-CM | POA: Clinically undetermined

## 2025-01-12 DIAGNOSIS — R300 Dysuria: Secondary | ICD-10-CM | POA: Diagnosis not present

## 2025-01-12 DIAGNOSIS — Z01810 Encounter for preprocedural cardiovascular examination: Secondary | ICD-10-CM | POA: Diagnosis not present

## 2025-01-12 DIAGNOSIS — I4891 Unspecified atrial fibrillation: Secondary | ICD-10-CM | POA: Diagnosis not present

## 2025-01-12 DIAGNOSIS — I10 Essential (primary) hypertension: Secondary | ICD-10-CM | POA: Diagnosis not present

## 2025-01-12 DIAGNOSIS — Z87891 Personal history of nicotine dependence: Secondary | ICD-10-CM | POA: Diagnosis not present

## 2025-01-12 DIAGNOSIS — K921 Melena: Secondary | ICD-10-CM | POA: Diagnosis not present

## 2025-01-12 DIAGNOSIS — I4892 Unspecified atrial flutter: Secondary | ICD-10-CM | POA: Diagnosis not present

## 2025-01-12 DIAGNOSIS — K449 Diaphragmatic hernia without obstruction or gangrene: Secondary | ICD-10-CM | POA: Diagnosis not present

## 2025-01-12 DIAGNOSIS — D72829 Elevated white blood cell count, unspecified: Secondary | ICD-10-CM

## 2025-01-12 DIAGNOSIS — K259 Gastric ulcer, unspecified as acute or chronic, without hemorrhage or perforation: Secondary | ICD-10-CM

## 2025-01-12 DIAGNOSIS — K920 Hematemesis: Secondary | ICD-10-CM | POA: Diagnosis not present

## 2025-01-12 DIAGNOSIS — E119 Type 2 diabetes mellitus without complications: Secondary | ICD-10-CM | POA: Diagnosis not present

## 2025-01-12 DIAGNOSIS — R197 Diarrhea, unspecified: Secondary | ICD-10-CM | POA: Diagnosis not present

## 2025-01-12 LAB — GASTROINTESTINAL PANEL BY PCR, STOOL (REPLACES STOOL CULTURE)

## 2025-01-12 LAB — CBC
HCT: 41.9 % (ref 39.0–52.0)
Hemoglobin: 14.3 g/dL (ref 13.0–17.0)
MCH: 32.7 pg (ref 26.0–34.0)
MCHC: 34.1 g/dL (ref 30.0–36.0)
MCV: 95.9 fL (ref 80.0–100.0)
Platelets: 275 10*3/uL (ref 150–400)
RBC: 4.37 MIL/uL (ref 4.22–5.81)
RDW: 15.2 % (ref 11.5–15.5)
WBC: 15.8 10*3/uL — ABNORMAL HIGH (ref 4.0–10.5)
nRBC: 0 % (ref 0.0–0.2)

## 2025-01-12 LAB — URINALYSIS, COMPLETE (UACMP) WITH MICROSCOPIC
Bilirubin Urine: NEGATIVE
Glucose, UA: NEGATIVE mg/dL
Ketones, ur: 5 mg/dL — AB
Nitrite: POSITIVE — AB
Protein, ur: 30 mg/dL — AB
Specific Gravity, Urine: 1.017 (ref 1.005–1.030)
WBC, UA: >50 WBC/hpf (ref 0–5)
pH: 6 (ref 5.0–8.0)

## 2025-01-12 LAB — BASIC METABOLIC PANEL WITH GFR
Anion gap: 10 (ref 5–15)
BUN: 14 mg/dL (ref 8–23)
CO2: 26 mmol/L (ref 22–32)
Calcium: 8.8 mg/dL — ABNORMAL LOW (ref 8.9–10.3)
Chloride: 103 mmol/L (ref 98–111)
Creatinine, Ser: 0.92 mg/dL (ref 0.61–1.24)
GFR, Estimated: >60 mL/min
Glucose, Bld: 92 mg/dL (ref 70–99)
Potassium: 3.9 mmol/L (ref 3.5–5.1)
Sodium: 139 mmol/L (ref 135–145)

## 2025-01-12 LAB — HEPARIN LEVEL (UNFRACTIONATED): Heparin Unfractionated: 0.28 [IU]/mL — ABNORMAL LOW (ref 0.30–0.70)

## 2025-01-12 LAB — GLUCOSE, CAPILLARY
Glucose-Capillary: 128 mg/dL — ABNORMAL HIGH (ref 70–99)
Glucose-Capillary: 105 mg/dL — ABNORMAL HIGH (ref 70–99)
Glucose-Capillary: 101 mg/dL — ABNORMAL HIGH (ref 70–99)
Glucose-Capillary: 123 mg/dL — ABNORMAL HIGH (ref 70–99)
Glucose-Capillary: 104 mg/dL — ABNORMAL HIGH (ref 70–99)

## 2025-01-12 LAB — HEMOGLOBIN A1C
Hgb A1c MFr Bld: 5.4 % (ref 4.8–5.6)
Mean Plasma Glucose: 108.28 mg/dL

## 2025-01-12 LAB — MISC LABCORP TEST (SEND OUT): Labcorp test code: 83935

## 2025-01-12 LAB — MAGNESIUM: Magnesium: 2.1 mg/dL (ref 1.7–2.4)

## 2025-01-12 MED ORDER — PROPOFOL 500 MG/50ML IV EMUL
INTRAVENOUS | Status: DC | PRN
  Administered 2025-01-12: 100 mg via INTRAVENOUS
  Administered 2025-01-12: 150 ug/kg/min via INTRAVENOUS

## 2025-01-12 MED ORDER — LIDOCAINE HCL (CARDIAC) PF 100 MG/5ML IV SOSY
PREFILLED_SYRINGE | INTRAVENOUS | Status: DC | PRN
  Administered 2025-01-12: 100 mg via INTRATRACHEAL

## 2025-01-12 MED ORDER — SODIUM CHLORIDE 0.9 % IV SOLN: INTRAVENOUS | Status: DC | PRN

## 2025-01-12 MED ORDER — PHENYLEPHRINE HCL (PRESSORS) 10 MG/ML IV SOLN
INTRAVENOUS | Status: DC | PRN
  Administered 2025-01-12 (×2): 160 ug via INTRAVENOUS

## 2025-01-12 MED ORDER — PROPOFOL 1000 MG/100ML IV EMUL
INTRAVENOUS | Status: AC
  Filled 2025-01-12: qty 100

## 2025-01-12 MED ORDER — SUCRALFATE 1 GM/10ML PO SUSP
1.0000 g | Freq: Two times a day (BID) | ORAL | Status: DC
  Administered 2025-01-12 – 2025-01-14 (×5): 1 g via ORAL
  Filled 2025-01-12 (×5): qty 10

## 2025-01-12 MED ORDER — SODIUM CHLORIDE 0.9 % IV SOLN: INTRAVENOUS | Status: DC

## 2025-01-12 MED ORDER — PROPOFOL 10 MG/ML IV BOLUS
INTRAVENOUS | Status: AC
  Filled 2025-01-12: qty 20

## 2025-01-12 MED ORDER — ZINC OXIDE 40 % EX OINT
TOPICAL_OINTMENT | Freq: Two times a day (BID) | CUTANEOUS | Status: DC
  Filled 2025-01-12: qty 57

## 2025-01-12 MED ORDER — SODIUM CHLORIDE 0.9 % IV SOLN
2.0000 g | INTRAVENOUS | Status: DC
  Administered 2025-01-12 – 2025-01-13 (×2): 2 g via INTRAVENOUS
  Filled 2025-01-12 (×2): qty 20

## 2025-01-12 MED ORDER — APIXABAN 5 MG PO TABS
5.0000 mg | ORAL_TABLET | Freq: Two times a day (BID) | ORAL | Status: DC
  Administered 2025-01-12 – 2025-01-14 (×4): 5 mg via ORAL
  Filled 2025-01-12 (×4): qty 1

## 2025-01-12 MED ORDER — HEPARIN (PORCINE) 25000 UT/250ML-% IV SOLN: 1900.0000 [IU]/h | INTRAVENOUS | Status: DC

## 2025-01-12 MED ORDER — SUCRALFATE 1 GM/10ML PO SUSP: 1.0000 g | Freq: Three times a day (TID) | ORAL | Status: DC

## 2025-01-12 NOTE — Progress Notes (Signed)
 PHARMACY - ANTICOAGULATION CONSULT NOTE  Pharmacy Consult for Heparin  Indication: atrial fibrillation  Allergies[1]  Patient Measurements: Height: 6' 2 (188 cm) Weight: 92.8 kg (204 lb 8 oz) IBW/kg (Calculated) : 82.2 HEPARIN  DW (KG): 92.8  Vital Signs: Temp: 98.7 F (37.1 C) (01/29 1323) Temp Source: Temporal (01/29 1323) BP: 105/65 (01/29 1340) Pulse Rate: 78 (01/29 1340)  Labs: Recent Labs    01/10/25 1659 01/10/25 1659 01/11/25 0540 01/11/25 1521 01/11/25 2258 01/12/25 0649  HGB 17.3*  --  14.9  --   --  14.3  HCT 52.3*  --  45.4  --   --  41.9  PLT 339  --  269  --   --  275  HEPARINUNFRC  --    < > <0.10* <0.10* 0.23* 0.28*  CREATININE 0.93  --  0.92  --   --  0.92   < > = values in this interval not displayed.    Estimated Creatinine Clearance: 93.1 mL/min (by C-G formula based on SCr of 0.92 mg/dL).   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 Not on anticoagulation PTA.  Patient reports hematemesis on admission. He does not have any  significant GI history, however GI to consult.  He receives wound care on a routine basis outpatient for chronic right ankle wound.  Pharmacy consulted for heparin  management.    Today, 01/12/25: -Heparin  level 0.28, remains subtherapeutic but rising following heparin  rate increase to 1850 units/hr -Heparin  held around 0830 for EGD -EGD with non-bleeding gastric ulcers with a clean ulcer base  Goal of Therapy:  Heparin  level 0.3-0.7 units/ml Monitor platelets by anticoagulation protocol: Yes   Plan:  -Okay to resume heparin  infusion at 4 pm -Resume heparin  infusion at 1900 units/hr (slightly higher than previous rate as his level was just barely subtherapeutic this morning prior to turning off heparin ) -Check heparin  level ~ 6 hours after resuming infusion -Daily heparin  level & CBC while on heparin  -Continue to monitor for any new or worsening bleeding  Stefano MARLA Bologna, PharmD, BCPS Clinical Pharmacist 01/12/2025 1:52 PM       [1] No Known Allergies

## 2025-01-12 NOTE — Progress Notes (Addendum)
 "    [facility] Progress Note  CC:  N/V, hematemesis   Subjective: He remains NPO after midnight for EGD today.  No nausea or vomiting.  No abdominal pain.  He endorsed passing a small dark brown to black loose stool yesterday evening around 9:30 PM without further recurrence since then.  No bright red blood per the rectum.  He stated having frequent urination without dysuria which started after midnight. No obvious hematuria. No chest pain or shortness of breath.   Objective:  Vital signs in last 24 hours: Temp:  [98.2 F (36.8 C)-98.7 F (37.1 C)] 98.2 F (36.8 C) (01/29 0402) Pulse Rate:  [89-98] 89 (01/29 0402) Resp:  [15-20] 15 (01/29 0402) BP: (116-123)/(70-84) 123/74 (01/29 0402) SpO2:  [95 %-96 %] 96 % (01/29 0402) Last BM Date : 01/11/25 General: Alert, well-developed in NAD Heart: Irregular rhythm.  No murmurs. Pulm: Breath sounds clear throughout.  On room air. Abdomen: Soft, nondistended.  Nontender.  Positive bowel sounds to all 4 quadrants. Extremities: No lower extremity edema. Neurologic:  Alert and oriented x 4. Grossly normal neurologically. Psych:  Alert and cooperative. Normal mood and affect.  Intake/Output from previous day: 01/28 0701 - 01/29 0700 In: 2158.9 [P.O.:600; I.V.:1558.9] Out: 1170 [Urine:1170] Intake/Output this shift: No intake/output data recorded.  Lab Results: Recent Labs    01/10/25 1659 01/11/25 0540 01/12/25 0649  WBC 18.2* 10.7* 15.8*  HGB 17.3* 14.9 14.3  HCT 52.3* 45.4 41.9  PLT 339 269 275   BMET Recent Labs    01/10/25 1659 01/11/25 0540 01/12/25 0649  NA 131* 137 139  K 4.7 4.3 3.9  CL 97* 104 103  CO2 21* 24 26  GLUCOSE 216* 95 92  BUN 20 14 14   CREATININE 0.93 0.92 0.92  CALCIUM 9.4 8.4* 8.8*   LFT Recent Labs    01/10/25 1659  PROT 7.8  ALBUMIN 4.6  AST 21  ALT 19  ALKPHOS 57  BILITOT 1.6*   PT/INR No results for input(s): LABPROT, INR in the last 72 hours. Hepatitis  Panel No results for input(s): HEPBSAG, HCVAB, HEPAIGM, HEPBIGM in the last 72 hours.  ECHOCARDIOGRAM COMPLETE Result Date: 01/11/2025    ECHOCARDIOGRAM REPORT   Patient Name:   LAVANTE TOSO Date of Exam: 01/11/2025 Medical Rec #:  [id number]     Height:       74.0 in Accession #:    [id number]    Weight:       204.5 lb Date of Birth:  March 05, 1961    BSA:          2.194 m Patient Age:    64 years      BP:           126/76 mmHg Patient Gender: M             HR:           80 bpm. Exam Location:  Inpatient Procedure: 2D Echo, Cardiac Doppler and Color Doppler (Both Spectral and Color            Flow Doppler were utilized during procedure). Indications:    I48.92* Unspecified atrial flutter  History:        Patient has prior history of Echocardiogram examinations, most                 recent 07/04/2022. Risk Factors:Former Smoker and Hypertension.  Sonographer:    Merlynn Argyle Referring Phys: [id number] CAROLE N HALL IMPRESSIONS  1. Left ventricular  ejection fraction, by estimation, is 55 to 60%. The left ventricle has normal function. The left ventricle has no regional wall motion abnormalities. There is mild left ventricular hypertrophy. Left ventricular diastolic parameters are indeterminate.  2. Right ventricular systolic function is normal. The right ventricular size is normal. There is normal pulmonary artery systolic pressure.  3. The mitral valve is normal in structure. Trivial mitral valve regurgitation. No evidence of mitral stenosis.  4. The aortic valve is tricuspid. There is mild calcification of the aortic valve. Aortic valve regurgitation is not visualized. Aortic valve sclerosis/calcification is present, without any evidence of aortic stenosis.  5. Aortic dilatation noted. There is borderline dilatation of the aortic root, measuring 38 mm. There is borderline dilatation of the ascending aorta, measuring 38 mm.  6. The inferior vena cava is dilated in size with >50% respiratory variability,  suggesting right atrial pressure of 8 mmHg. FINDINGS  Left Ventricle: Left ventricular ejection fraction, by estimation, is 55 to 60%. The left ventricle has normal function. The left ventricle has no regional wall motion abnormalities. The left ventricular internal cavity size was normal in size. There is  mild left ventricular hypertrophy. Left ventricular diastolic parameters are indeterminate. Right Ventricle: The right ventricular size is normal. No increase in right ventricular wall thickness. Right ventricular systolic function is normal. There is normal pulmonary artery systolic pressure. The tricuspid regurgitant velocity is 2.01 m/s, and  with an assumed right atrial pressure of 8 mmHg, the estimated right ventricular systolic pressure is 24.2 mmHg. Left Atrium: Left atrial size was normal in size. Right Atrium: Right atrial size was normal in size. Pericardium: There is no evidence of pericardial effusion. Mitral Valve: The mitral valve is normal in structure. Trivial mitral valve regurgitation. No evidence of mitral valve stenosis. Tricuspid Valve: The tricuspid valve is normal in structure. Tricuspid valve regurgitation is trivial. No evidence of tricuspid stenosis. Aortic Valve: The aortic valve is tricuspid. There is mild calcification of the aortic valve. Aortic valve regurgitation is not visualized. Aortic valve sclerosis/calcification is present, without any evidence of aortic stenosis. Pulmonic Valve: The pulmonic valve was normal in structure. Pulmonic valve regurgitation is trivial. No evidence of pulmonic stenosis. Aorta: Aortic dilatation noted. There is borderline dilatation of the aortic root, measuring 38 mm. There is borderline dilatation of the ascending aorta, measuring 38 mm. Venous: The inferior vena cava is dilated in size with greater than 50% respiratory variability, suggesting right atrial pressure of 8 mmHg. IAS/Shunts: No atrial level shunt detected by color flow Doppler.  LEFT  VENTRICLE PLAX 2D LVIDd:         5.00 cm LVIDs:         3.60 cm LV PW:         1.20 cm LV IVS:        1.20 cm LVOT diam:     2.20 cm LV SV:         77 LV SV Index:   35 LVOT Area:     3.80 cm  RIGHT VENTRICLE             IVC RV Basal diam:  3.70 cm     IVC diam: 2.20 cm RV S prime:     14.80 cm/s TAPSE (M-mode): 1.8 cm LEFT ATRIUM             Index        RIGHT ATRIUM           Index LA diam:  3.70 cm 1.69 cm/m   RA Area:     11.00 cm LA Vol (A2C):   39.2 ml 17.87 ml/m  RA Volume:   19.10 ml  8.71 ml/m LA Vol (A4C):   43.5 ml 19.83 ml/m LA Biplane Vol: 43.2 ml 19.69 ml/m  AORTIC VALVE LVOT Vmax:   109.00 cm/s LVOT Vmean:  76.400 cm/s LVOT VTI:    0.203 m  AORTA Ao Root diam: 3.80 cm Ao Asc diam:  3.80 cm TRICUSPID VALVE TR Peak grad:   16.2 mmHg TR Vmax:        201.00 cm/s  SHUNTS Systemic VTI:  0.20 m Systemic Diam: 2.20 cm Toribio Fuel MD Electronically signed by Toribio Fuel MD Signature Date/Time: 01/11/2025/8:41:17 AM    Final    CT ABDOMEN PELVIS W CONTRAST Result Date: 01/10/2025 EXAM: CT ABDOMEN AND PELVIS WITH CONTRAST 01/10/2025 07:50:52 PM TECHNIQUE: CT of the abdomen and pelvis was performed with the administration of 100 mL of iohexol  (OMNIPAQUE ) 300 MG/ML solution. Multiplanar reformatted images are provided for review. Automated exposure control, iterative reconstruction, and/or weight-based adjustment of the mA/kV was utilized to reduce the radiation dose to as low as reasonably achievable. COMPARISON: Comparison study 02/11/2023. CLINICAL HISTORY: Abdominal pain and diarrhea. FINDINGS: LOWER CHEST: No acute abnormality. LIVER: Fatty infiltration of the liver is noted. GALLBLADDER AND BILE DUCTS: The gallbladder is well distended. No dependent gallstones are seen. No biliary ductal dilatation. SPLEEN: The spleen appears within normal limits. PANCREAS: No acute abnormality. ADRENAL GLANDS: No acute abnormality. KIDNEYS, URETERS AND BLADDER: The kidneys demonstrate no renal  calculi or obstructive changes. Normal excretion is noted on delayed images. No perinephric or periureteral stranding. The bladder is well distended. GI AND BOWEL: Stomach and small bowel are within normal limits. No obstructive or inflammatory changes of the colon are seen. The appendix has been surgically removed. There is no bowel obstruction. PERITONEUM AND RETROPERITONEUM: No ascites. No free air. VASCULATURE: Aorta is normal in caliber. LYMPH NODES: No lymphadenopathy. REPRODUCTIVE ORGANS: No acute abnormality. BONES AND SOFT TISSUES: No acute osseous abnormality is seen. Stable SI joint sclerosis is seen. Postsurgical changes in the lower lumbar spine are noted. No focal soft tissue abnormality. IMPRESSION: 1. No acute findings in the abdomen or pelvis. Electronically signed by: Oneil Devonshire MD 01/10/2025 08:05 PM EST RP Workstation: [id number]    Assessment / Plan:  64 year old male presenting with N/V with hematemesis x 2 episodes.  Patient passed 1 small dark brown/black stool at 9:30 PM 1/28. No abdominal pain.  CTAP with contrast did not identify any intra-abdominal/pelvic pathology to explain symptoms. - NPO  - Cardiac clearance received per cardiology and okay to hold Heparin  IV x 4 hours prior to EGD - Patient to proceed with EGD at 12:30 PM today as scheduled.  Heparin  IV on hold since around 8:15 AM. - Continue PPI IV bid - Ondansetron  4 mg PR IV every 6 hours as needed   GERD.  EGD 02/2023 showed reflux esophagitis, 3 cm hiatal hernia and gastritis. - See plan above    Diarrhea x 1 episode.  Patient passed 1 small dark brown/black stool yesterday evening.  GI pathogen panel pending. - Nursing staff to document BMs every shift  Increased urinary frequency, started after midnight.  -Management per the hospitalist  Leukocytosis, WBC 18.2 -> 10.7 -> 15.8. Afebrile.    Rectal bleeding, likely from internal and external hemorrhoids.  Colonoscopy 02/2023 identified 1 sessile serrated  polyp removed from the colon,  diverticulosis to the left colon and internal/external hemorrhoids. - Desitin three times daily as needed for anal or hemorrhoidal irritation/bleeding.  - Anusol   25mg  suppository 1 PR nightly x 3 nights - Further management as an outpatient   Atrial flutter/Atrial fib. CHA2DS2-VASc Score = 2.  ECHO 01/11/2025 showed LVEF 55 to 60%.  IV Heparin  on hold due to EGD today at 12:30 PM. - Management per cardiology    Wegener's granulomatosis and leukocytoclastic vasculitis. On Methotrexate  Prednisone  and Doxycycline  per rheumatology.   DM type II  Principal Problem:   Atrial flutter (HCC) Active Problems:   Hematemesis of fresh blood   Hematochezia   Preprocedural cardiovascular examination   Bilateral sensorineural hearing loss   Benign hypertension   New onset atrial flutter (HCC)   Nausea vomiting and diarrhea   Diabetes mellitus type 2, controlled (HCC)     LOS: 0 days   Elida CHRISTELLA Shawl  01/12/2025, 8:39 AM   I have taken a history, reviewed the chart and examined the patient. I performed a substantive portion of this encounter (greater than 50%), including complete performance of at least one of the key components, in conjunction with the APP. I agree with the APP's note, impression and recommendations with additional input as follows.  Proceed with EGD today  Estefana Kidney, MD "

## 2025-01-12 NOTE — Plan of Care (Incomplete)
" °  Problem: Clinical Measurements: Goal: Will remain free from infection Outcome: Progressing Goal: Diagnostic test results will improve Outcome: Progressing   Problem: Education: Goal: Knowledge of General Education information will improve Description: Including pain rating scale, medication(s)/side effects and non-pharmacologic comfort measures Outcome: Adequate for Discharge   Problem: Health Behavior/Discharge Planning: Goal: Ability to manage health-related needs will improve Outcome: Adequate for Discharge   Problem: Clinical Measurements: Goal: Ability to maintain clinical measurements within normal limits will improve Outcome: Adequate for Discharge Goal: Respiratory complications will improve Outcome: Adequate for Discharge   "

## 2025-01-12 NOTE — Anesthesia Postprocedure Evaluation (Signed)
"   Anesthesia Post Note  Patient: Shawn [name]  Procedure(s) Performed: EGD (ESOPHAGOGASTRODUODENOSCOPY)     Patient location during evaluation: Endoscopy Anesthesia Type: MAC Level of consciousness: oriented, patient cooperative and awake and alert Pain management: pain level controlled Vital Signs Assessment: post-procedure vital signs reviewed and stable Respiratory status: spontaneous breathing, nonlabored ventilation, respiratory function stable and patient connected to nasal cannula oxygen Cardiovascular status: stable (remains in aflutter, cardiology aware and is managing) Postop Assessment: no apparent nausea or vomiting Anesthetic complications: no   No notable events documented.  Last Vitals:  Vitals:   01/12/25 1154 01/12/25 1323  BP: 120/82 (!) 90/53  Pulse: 92 76  Resp: (!) 22 (!) 33  Temp: 37.7 C 37.1 C  SpO2: 97% 95%    Last Pain:  Vitals:   01/12/25 1323  TempSrc: Temporal  PainSc: Asleep                 Cherlynn Popiel,E. Revella Shelton      "

## 2025-01-12 NOTE — Anesthesia Preprocedure Evaluation (Addendum)
"                                    Anesthesia Evaluation  Patient identified by MRN, date of birth, ID band Patient awake    Reviewed: Allergy & Precautions, NPO status , Patient's Chart, lab work & pertinent test results  History of Anesthesia Complications Negative for: history of anesthetic complications  Airway Mallampati: II  TM Distance: >3 FB Neck ROM: Full    Dental  (+) Partial Upper, Dental Advisory Given, Missing   Pulmonary former smoker   Pulmonary exam normal breath sounds clear to auscultation       Cardiovascular hypertension, Pt. on medications and Pt. on home beta blockers (-) angina + dysrhythmias (new onset Aflutter, cards not cardioverting at this time) Atrial Fibrillation and Supra Ventricular Tachycardia  Rhythm:Irregular Rate:Normal  01/11/2025 ECHO: EF 55 to 60%.  1. The left ventricle has normal function, no regional wall motion abnormalities. There is mild left ventricular hypertrophy.   2. RVF is normal. The right ventricular size is normal. There is normal pulmonary artery systolic pressure.   3. The mitral valve is normal in structure. Trivial mitral valve regurgitation. No evidence of mitral stenosis.   4. The aortic valve is tricuspid. There is mild calcification of the aortic valve. Aortic valve regurgitation is not visualized. Aortic valve sclerosis/calcification is present, without any evidence of aortic stenosis.     Neuro/Psych Cochlear implant    GI/Hepatic Neg liver ROS,GERD  Medicated and Controlled,,  Endo/Other  diabetes (glu 101), Oral Hypoglycemic Agents    Renal/GU Renal InsufficiencyRenal disease     Musculoskeletal   Abdominal   Peds  Hematology Hb 14.3, plt 275k   Anesthesia Other Findings   Reproductive/Obstetrics                              Anesthesia Physical Anesthesia Plan  ASA: 3  Anesthesia Plan: MAC   Post-op Pain Management: Minimal or no pain anticipated    Induction:   PONV Risk Score and Plan: 1 and Treatment may vary due to age or medical condition  Airway Management Planned: Natural Airway and Simple Face Mask  Additional Equipment: None  Intra-op Plan:   Post-operative Plan:   Informed Consent: I have reviewed the patients History and Physical, chart, labs and discussed the procedure including the risks, benefits and alternatives for the proposed anesthesia with the patient or authorized representative who has indicated his/her understanding and acceptance.     Dental advisory given  Plan Discussed with: CRNA and Surgeon  Anesthesia Plan Comments:          Anesthesia Quick Evaluation  "

## 2025-01-12 NOTE — Plan of Care (Signed)

## 2025-01-12 NOTE — Progress Notes (Signed)
 " Progress Note  Patient Name: Shawn [name] Date of Encounter: 01/12/2025  Primary Cardiologist: Stanly DELENA Leavens, MD  Subjective   Patient out of room, gone to EGD.  Inpatient Medications    Scheduled Meds:  clobetasol  ointment  1 Application Topical Daily   doxycycline   100 mg Oral Daily   folic acid   1 mg Oral Daily   gabapentin   300 mg Oral Daily   And   gabapentin   600 mg Oral QHS   hydrocortisone   25 mg Rectal QHS   insulin  aspart  0-9 Units Subcutaneous Q4H   liver oil-zinc  oxide   Topical BID   [START ON 01/14/2025] methotrexate   15 mg Oral Weekly   metoprolol  tartrate  12.5 mg Oral BID   pantoprazole  (PROTONIX ) IV  40 mg Intravenous BID   predniSONE   10 mg Oral Q lunch   saccharomyces boulardii  250 mg Oral BID   triamcinolone  ointment  1 Application Topical Daily   Continuous Infusions:  sodium chloride  20 mL/hr at 01/12/25 0826   sodium chloride      PRN Meds: acetaminophen , melatonin, mouth rinse, oxyCODONE , prochlorperazine    Vital Signs    Vitals:   01/11/25 0337 01/11/25 1353 01/11/25 1926 01/12/25 0402  BP: 126/76 116/70 123/84 123/74  Pulse: 82 98 89 89  Resp: 15 20 15 15   Temp: 98.4 F (36.9 C) 98.3 F (36.8 C) 98.7 F (37.1 C) 98.2 F (36.8 C)  TempSrc: Oral   Oral  SpO2: 95% 95% 95% 96%  Weight:      Height:        Intake/Output Summary (Last 24 hours) at 01/12/2025 1136 Last data filed at 01/12/2025 1052 Gross per 24 hour  Intake 2158.94 ml  Output 1045 ml  Net 1113.94 ml      01/10/2025   11:17 PM 07/29/2024    6:08 AM 06/08/2024    1:42 PM  Last 3 Weights  Weight (lbs) 204 lb 8 oz 204 lb 204 lb  Weight (kg) 92.761 kg 92.534 kg 92.534 kg     Telemetry    Atrial flutter HR predominantly 80s - Personally Reviewed  Physical Exam   Pending reassessment by MD upon return.  Labs    High Sensitivity Troponin:  No results for input(s): TROPONINIHS in the last 720 hours.    Cardiac EnzymesNo results for input(s):  TROPONINI in the last 168 hours. No results for input(s): TROPIPOC in the last 168 hours.   Chemistry Recent Labs  Lab 01/10/25 1659 01/11/25 0540 01/12/25 0649  NA 131* 137 139  K 4.7 4.3 3.9  CL 97* 104 103  CO2 21* 24 26  GLUCOSE 216* 95 92  BUN 20 14 14   CREATININE 0.93 0.92 0.92  CALCIUM 9.4 8.4* 8.8*  PROT 7.8  --   --   ALBUMIN 4.6  --   --   AST 21  --   --   ALT 19  --   --   ALKPHOS 57  --   --   BILITOT 1.6*  --   --   GFRNONAA >60 >60 >60  ANIONGAP 13 10 10      Hematology Recent Labs  Lab 01/10/25 1659 01/11/25 0540 01/12/25 0649  WBC 18.2* 10.7* 15.8*  RBC 5.47 4.64 4.37  HGB 17.3* 14.9 14.3  HCT 52.3* 45.4 41.9  MCV 95.6 97.8 95.9  MCH 31.6 32.1 32.7  MCHC 33.1 32.8 34.1  RDW 15.0 15.2 15.2  PLT 339 269 275  BNPNo results for input(s): BNP, PROBNP in the last 168 hours.   DDimer No results for input(s): DDIMER in the last 168 hours.   Radiology    ECHOCARDIOGRAM COMPLETE Result Date: 01/11/2025    ECHOCARDIOGRAM REPORT   Patient Name:   Shawn [name] Date of Exam: 01/11/2025 Medical Rec #:  [id number]     Height:       74.0 in Accession #:    [id number]    Weight:       204.5 lb Date of Birth:  1961/02/06    BSA:          2.194 m Patient Age:    63 years      BP:           126/76 mmHg Patient Gender: M             HR:           80 bpm. Exam Location:  Inpatient Procedure: 2D Echo, Cardiac Doppler and Color Doppler (Both Spectral and Color            Flow Doppler were utilized during procedure). Indications:    I48.92* Unspecified atrial flutter  History:        Patient has prior history of Echocardiogram examinations, most                 recent 07/04/2022. Risk Factors:Former Smoker and Hypertension.  Sonographer:    Merlynn Argyle Referring Phys: [id number] CAROLE N HALL IMPRESSIONS  1. Left ventricular ejection fraction, by estimation, is 55 to 60%. The left ventricle has normal function. The left ventricle has no regional wall motion  abnormalities. There is mild left ventricular hypertrophy. Left ventricular diastolic parameters are indeterminate.  2. Right ventricular systolic function is normal. The right ventricular size is normal. There is normal pulmonary artery systolic pressure.  3. The mitral valve is normal in structure. Trivial mitral valve regurgitation. No evidence of mitral stenosis.  4. The aortic valve is tricuspid. There is mild calcification of the aortic valve. Aortic valve regurgitation is not visualized. Aortic valve sclerosis/calcification is present, without any evidence of aortic stenosis.  5. Aortic dilatation noted. There is borderline dilatation of the aortic root, measuring 38 mm. There is borderline dilatation of the ascending aorta, measuring 38 mm.  6. The inferior vena cava is dilated in size with >50% respiratory variability, suggesting right atrial pressure of 8 mmHg. FINDINGS  Left Ventricle: Left ventricular ejection fraction, by estimation, is 55 to 60%. The left ventricle has normal function. The left ventricle has no regional wall motion abnormalities. The left ventricular internal cavity size was normal in size. There is  mild left ventricular hypertrophy. Left ventricular diastolic parameters are indeterminate. Right Ventricle: The right ventricular size is normal. No increase in right ventricular wall thickness. Right ventricular systolic function is normal. There is normal pulmonary artery systolic pressure. The tricuspid regurgitant velocity is 2.01 m/s, and  with an assumed right atrial pressure of 8 mmHg, the estimated right ventricular systolic pressure is 24.2 mmHg. Left Atrium: Left atrial size was normal in size. Right Atrium: Right atrial size was normal in size. Pericardium: There is no evidence of pericardial effusion. Mitral Valve: The mitral valve is normal in structure. Trivial mitral valve regurgitation. No evidence of mitral valve stenosis. Tricuspid Valve: The tricuspid valve is normal in  structure. Tricuspid valve regurgitation is trivial. No evidence of tricuspid stenosis. Aortic Valve: The aortic valve is tricuspid. There is mild calcification of the  aortic valve. Aortic valve regurgitation is not visualized. Aortic valve sclerosis/calcification is present, without any evidence of aortic stenosis. Pulmonic Valve: The pulmonic valve was normal in structure. Pulmonic valve regurgitation is trivial. No evidence of pulmonic stenosis. Aorta: Aortic dilatation noted. There is borderline dilatation of the aortic root, measuring 38 mm. There is borderline dilatation of the ascending aorta, measuring 38 mm. Venous: The inferior vena cava is dilated in size with greater than 50% respiratory variability, suggesting right atrial pressure of 8 mmHg. IAS/Shunts: No atrial level shunt detected by color flow Doppler.  LEFT VENTRICLE PLAX 2D LVIDd:         5.00 cm LVIDs:         3.60 cm LV PW:         1.20 cm LV IVS:        1.20 cm LVOT diam:     2.20 cm LV SV:         77 LV SV Index:   35 LVOT Area:     3.80 cm  RIGHT VENTRICLE             IVC RV Basal diam:  3.70 cm     IVC diam: 2.20 cm RV S prime:     14.80 cm/s TAPSE (M-mode): 1.8 cm LEFT ATRIUM             Index        RIGHT ATRIUM           Index LA diam:        3.70 cm 1.69 cm/m   RA Area:     11.00 cm LA Vol (A2C):   39.2 ml 17.87 ml/m  RA Volume:   19.10 ml  8.71 ml/m LA Vol (A4C):   43.5 ml 19.83 ml/m LA Biplane Vol: 43.2 ml 19.69 ml/m  AORTIC VALVE LVOT Vmax:   109.00 cm/s LVOT Vmean:  76.400 cm/s LVOT VTI:    0.203 m  AORTA Ao Root diam: 3.80 cm Ao Asc diam:  3.80 cm TRICUSPID VALVE TR Peak grad:   16.2 mmHg TR Vmax:        201.00 cm/s  SHUNTS Systemic VTI:  0.20 m Systemic Diam: 2.20 cm Toribio Fuel MD Electronically signed by Toribio Fuel MD Signature Date/Time: 01/11/2025/8:41:17 AM    Final    CT ABDOMEN PELVIS W CONTRAST Result Date: 01/10/2025 EXAM: CT ABDOMEN AND PELVIS WITH CONTRAST 01/10/2025 07:50:52 PM TECHNIQUE: CT of the  abdomen and pelvis was performed with the administration of 100 mL of iohexol  (OMNIPAQUE ) 300 MG/ML solution. Multiplanar reformatted images are provided for review. Automated exposure control, iterative reconstruction, and/or weight-based adjustment of the mA/kV was utilized to reduce the radiation dose to as low as reasonably achievable. COMPARISON: Comparison study 02/11/2023. CLINICAL HISTORY: Abdominal pain and diarrhea. FINDINGS: LOWER CHEST: No acute abnormality. LIVER: Fatty infiltration of the liver is noted. GALLBLADDER AND BILE DUCTS: The gallbladder is well distended. No dependent gallstones are seen. No biliary ductal dilatation. SPLEEN: The spleen appears within normal limits. PANCREAS: No acute abnormality. ADRENAL GLANDS: No acute abnormality. KIDNEYS, URETERS AND BLADDER: The kidneys demonstrate no renal calculi or obstructive changes. Normal excretion is noted on delayed images. No perinephric or periureteral stranding. The bladder is well distended. GI AND BOWEL: Stomach and small bowel are within normal limits. No obstructive or inflammatory changes of the colon are seen. The appendix has been surgically removed. There is no bowel obstruction. PERITONEUM AND RETROPERITONEUM: No ascites. No free air. VASCULATURE:  Aorta is normal in caliber. LYMPH NODES: No lymphadenopathy. REPRODUCTIVE ORGANS: No acute abnormality. BONES AND SOFT TISSUES: No acute osseous abnormality is seen. Stable SI joint sclerosis is seen. Postsurgical changes in the lower lumbar spine are noted. No focal soft tissue abnormality. IMPRESSION: 1. No acute findings in the abdomen or pelvis. Electronically signed by: Oneil Devonshire MD 01/10/2025 08:05 PM EST RP Workstation: [id number]    Cardiac Studies   2d echo 01/11/25   1. Left ventricular ejection fraction, by estimation, is 55 to 60%. The  left ventricle has normal function. The left ventricle has no regional  wall motion abnormalities. There is mild left ventricular  hypertrophy.  Left ventricular diastolic parameters  are indeterminate.   2. Right ventricular systolic function is normal. The right ventricular  size is normal. There is normal pulmonary artery systolic pressure.   3. The mitral valve is normal in structure. Trivial mitral valve  regurgitation. No evidence of mitral stenosis.   4. The aortic valve is tricuspid. There is mild calcification of the  aortic valve. Aortic valve regurgitation is not visualized. Aortic valve  sclerosis/calcification is present, without any evidence of aortic  stenosis.   5. Aortic dilatation noted. There is borderline dilatation of the aortic  root, measuring 38 mm. There is borderline dilatation of the ascending  aorta, measuring 38 mm.   6. The inferior vena cava is dilated in size with >50% respiratory  variability, suggesting right atrial pressure of 8 mmHg.   Patient Profile     64 y.o. male with atrial tachycardia, PVCs (2.4% in 2024), hypertension, MGUS, bilateral sensorineural hearing loss, GERD, leukocytoclastic vasculitis, Wegener's granulomatosis, anal fissure, hemorrhoids and colon polyps. cMRI 2023 not consistent with cardiac amyloidosis, left atrial echodensity is likely prominent coumadin ridge (normal variant structure). Presented to ED with intracrable nausea/vomiting and abdominal pain, found to have hematemesis. Also noted to be in new onset atrial flutter.  Assessment & Plan    1. Nausea, vomiting, hematemesis - started on BID PPI - for EGD today  2. New onset atrial flutter, prior hx of A-tach and PVCs - placed on IV heparin , OAC deferred pending completion of GI workup - on Toprol  50mg  daily PTA, now on Lopressor  12.5mg  BID - will review med plan to return to home dosing with MD - consider OP sleep study - TSH suppressed but free T4 wnl - consider subclinical hyperthyroidism, defer mgmt to primary team - consideration of DCCV deferred at this time given GI bleeding; can revisit as OP  once this is proven stable and able to consistently tolerate anticoagulation  3. Borderline dilation of aortic root/ascending aorta - anticipate routine OP f/u (consider echo 1 yr)  4. HTN - controlled, follow  Remainder per primary   For questions or updates, please contact [facility] Please consult [email or web address] for contact info under Cardiology/STEMI.  Signed, Rushawn Capshaw N Caprice Wasko, PA-C 01/12/2025, 11:36 AM    "

## 2025-01-12 NOTE — Transfer of Care (Signed)
 Immediate Anesthesia Transfer of Care Note  Patient: Shawn [name]  Procedure(s) Performed: EGD (ESOPHAGOGASTRODUODENOSCOPY)  Patient Location: PACU and Endoscopy Unit  Anesthesia Type:MAC  Level of Consciousness: drowsy, patient cooperative, and responds to stimulation  Airway & Oxygen Therapy: Patient Spontanous Breathing and Patient connected to face mask oxygen  Post-op Assessment: Report given to RN and Post -op Vital signs reviewed and stable  Post vital signs: Reviewed and stable  Last Vitals:  Vitals Value Taken Time  BP 90/53 01/12/25 13:23  Temp 37.1 C 01/12/25 13:23  Pulse 76 01/12/25 13:24  Resp 34 01/12/25 13:24  SpO2 95 % 01/12/25 13:24  Vitals shown include unfiled device data.  Last Pain:  Vitals:   01/12/25 1323  TempSrc: Temporal  PainSc: Asleep      Patients Stated Pain Goal: 0 (01/12/25 1231)  Complications: No notable events documented.

## 2025-01-12 NOTE — Progress Notes (Signed)
 PHARMACY - ANTICOAGULATION CONSULT NOTE  Pharmacy Consult for Heparin  Indication: atrial fibrillation  Allergies[1]  Patient Measurements: Height: 6' 2 (188 cm) Weight: 92.8 kg (204 lb 8 oz) IBW/kg (Calculated) : 82.2 HEPARIN  DW (KG): 92.8  Vital Signs: Temp: 98.7 F (37.1 C) (01/28 1926) BP: 123/84 (01/28 1926) Pulse Rate: 89 (01/28 1926)  Labs: Recent Labs    01/10/25 1659 01/11/25 0540 01/11/25 1521 01/11/25 2258  HGB 17.3* 14.9  --   --   HCT 52.3* 45.4  --   --   PLT 339 269  --   --   HEPARINUNFRC  --  <0.10* <0.10* 0.23*  CREATININE 0.93 0.92  --   --     Estimated Creatinine Clearance: 93.1 mL/min (by C-G formula based on SCr of 0.92 mg/dL).   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 Not on anticoagulation PTA.  Patient reports hematemesis on admission. He does not have any significant GI history, however GI to consult.  He receives wound care on a routine basis outpatient for chronic right ankle wound.  Cardiology consulted - possible  cardioversion.  Pharmacy consulted for heparin  management.  Per RN, patient has hemorrhoids that bleed frequently but are not currently bleeding and no signs of bleeding hemorrhoids during GI digital exam 1/28.  Today, 01/12/25: -Heparin  level = 0.23 subtherapeutic but rising following heparin  rate increase to 1700 units/hr - RN reports clear yellow urine tonight.  No s/sx of bleeding reported.  Goal of Therapy:  Heparin  level 0.3-0.7 units/ml Monitor platelets by anticoagulation protocol: Yes   Plan:  -Increase heparin  infusion to 1850 units/hr  -Check heparin  level ~6h after rate increase -Daily heparin  level & CBC while on heparin  -Per GI, hold heparin  1/29 @0830  in preparation for EGD at 1230   Arvin Gauss, PharmD Clinical Pharmacist  1/29/202612:31 AM     [1] No Known Allergies

## 2025-01-12 NOTE — Progress Notes (Signed)
 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  Patient noted to have 2 episodes of hematemesis.  Patient noted to have an episode of diarrhea at the wound care center 1 further episode at home prior to presentation however no further loose/watery stools.  Patient seen in the ED noted to be on new onset atrial flutter.  Patient admitted for new onset atrial flutter, concern for hematemesis.  Patient placed on beta-blocker and IV heparin .  GI and cardiology consulted.   Assessment & Plan:   Principal Problem:   Atrial flutter (HCC) Active Problems:   Hematemesis of fresh blood   Hematochezia   Preprocedural cardiovascular examination   Bilateral sensorineural hearing loss   Benign hypertension   New onset atrial flutter (HCC)   Nausea vomiting and diarrhea   Diabetes mellitus type 2, controlled (HCC)   Frequency of urination   Dysuria  #1 new onset atrial flutter -CHA2DS2 Vasc score 2 - Patient noted prior to onset of diarrhea and was having some lightheadedness and some dizziness however denied any palpitations. - Patient did note to cardiology of snoring, daytime somnolence, poor sleep quality. - TSH noted at 0.171 - Free T4 at 1.43. - Patient currently in atrial flutter on telemetry however rate controlled. -2D echo done with EF of 55 to 60%,NWMA, mild LVH, normal right ventricular systolic function, normal mitral valve structure, trivial MVR, no evidence of mitral stenosis. - Continue Lopressor  for rate control. -Patient noted to have been on Toprol -XL  50 mg prior to admission which will defer to cardiology on resumption on discharge. - Patient on IV heparin . - Patient seen in consultation by GI and to undergo EGD prior to starting patient on oral long-term anticoagulation. - Patient seen in consultation by cardiology who are recommending outpatient sleep study, transition to Eliquis  5 mg twice daily when cleared to do so by GI. - Cardiology recommending outpatient follow-up with primary cardiologist or A-fib clinic and then at that time if patient is still in atrial flutter may need cardioversion. - Appreciate cardiology input and recommendations.  2.  Hematemesis -Questionable etiology.  May be secondary to Mallory-Weiss tear versus peptic ulcer disease versus gastritis versus esophagitis. - Patient noted to have episodes of nausea and emesis initially as dry heaves which progressed to food like emesis followed by bloody emesis. - Patient with no further hematemesis. - It is noted per GI that patient had EGD 02/2023 that showed grade B reflux esophagitis, 3 cm hiatal hernia, gastritis. - CT abdomen and pelvis done with no acute findings. - Hemoglobin currently stable at 14.3 from 14.9 from 17.3 on admission likely dilutional. -Continue IV PPI every 12 hours - Follow H&H. - Patient seen in consultation by GI, patient scheduled for EGD today 01/12/2025. - Appreciate GI input and recommendations.  3.  Diarrhea -Noted to have 2 episodes of diarrhea day prior to admission none since admission. -No further episodes of diarrhea - Discontinued enteric precautions. - GI pathogen panel negative.  4.  Hypertension -BP currently stable - Continue Lopressor .  5.  Wegener's granulomatosis -Noted to be  on methotrexate  weekly, daily folic acid , prednisone , daily doxycycline  prior to admission which we will continue.  6.  Leukocytosis -Likely reactive. - Also noted on chronic steroid. - Leukocytosis fluctuating.   - Patient with complaints of  dysuria, urinary frequency and urgency.   - Check a UA with cultures and sensitivities.    7.  Type 2 diabetes mellitus with hyperglycemia  - Hemoglobin A1c 5.5 (01/27/2022) - Hemoglobin A1c noted at 5.4. - CBG at 105 this morning. -Continue to hold home regimen glipizide. -SSI.  8.  Rectal bleeding Hematochezia -Likely secondary to hemorrhoids. -No further hematochezia noted. -GI recommended Anusol  25 mg suppository rectally nightly x 3 nights. -Outpatient follow-up per GI  9.  Chronic right lower extremity wound, POA - Continue current wound care. - Outpatient follow-up by wound care clinic.  10.  Urinary urgency/urinary frequency/dysuria -Patient with complaints of urinary urgency frequency and dysuria which started last night. - Check a UA with cultures and sensitivities.        DVT prophylaxis: Heparin   Code Status: Full Family Communication: Updated patient and wife at bedside. Disposition: Likely home when cleared by GI and cardiology.  Status is: Observation The patient remains OBS appropriate and will d/c before 2 midnights.   Consultants:  Cardiology: Dr. Michele 01/11/2025 Gastroenterology: Dr. Federico 01/11/2025  Procedures:  CT abdomen and pelvis 01/10/2025 2D echo 01/11/2025   Antimicrobials:  Anti-infectives (From admission, onward)    Start     Dose/Rate Route Frequency Ordered Stop   01/11/25 1000  doxycycline  (VIBRA -TABS) tablet 100 mg        100 mg Oral Daily 01/11/25 0008           Subjective: Patient laying in bed.  Complained of a bout of nausea this morning.  Patient also with complaints of urinary frequency and urgency as well as dysuria which started last night.  Patient stated had 2 bowel movements 1 a smear and the other 1 admixture of solid and liquid which was darker than he had expected.  No further hematemesis.  Wife at bedside.  Telemetry with flutter waves noted.  Objective: Vitals:   01/11/25 0337 01/11/25 1353 01/11/25 1926  01/12/25 0402  BP: 126/76 116/70 123/84 123/74  Pulse: 82 98 89 89  Resp: 15 20 15 15   Temp: 98.4 F (36.9 C) 98.3 F (36.8 C) 98.7 F (37.1 C) 98.2 F (36.8 C)  TempSrc: Oral   Oral  SpO2: 95% 95% 95% 96%  Weight:      Height:        Intake/Output Summary (Last 24 hours) at 01/12/2025 1129 Last data filed at 01/12/2025 1052 Gross per 24 hour  Intake 2158.94 ml  Output 1045 ml  Net 1113.94 ml   Filed Weights   01/10/25 2317  Weight: 92.8 kg    Examination:  General exam: NAD. Respiratory system: CTAB.  No wheezes, no crackles, no rhonchi.  Fair air movement.  Speaking in full sentences.   Cardiovascular system: Irregular.  No JVD, no murmurs rubs or gallops.  No lower extremity edema.  Gastrointestinal system: Abdomen is soft, nontender, nondistended, positive bowel sounds.  No rebound.  No guarding.  Central nervous system: Alert and oriented. No focal neurological deficits. Extremities: Symmetric 5 x 5 power. Skin: No rashes, lesions or ulcers Psychiatry: Judgement and insight appear normal. Mood & affect appropriate.     Data Reviewed: I have personally reviewed following labs and imaging studies  CBC: Recent Labs  Lab 01/10/25 1659 01/11/25  0540 01/12/25 0649  WBC 18.2* 10.7* 15.8*  NEUTROABS 15.9*  --   --   HGB 17.3* 14.9 14.3  HCT 52.3* 45.4 41.9  MCV 95.6 97.8 95.9  PLT 339 269 275    Basic Metabolic Panel: Recent Labs  Lab 01/10/25 1659 01/11/25 0540 01/12/25 0649  NA 131* 137 139  K 4.7 4.3 3.9  CL 97* 104 103  CO2 21* 24 26  GLUCOSE 216* 95 92  BUN 20 14 14   CREATININE 0.93 0.92 0.92  CALCIUM 9.4 8.4* 8.8*  MG  --  2.2 2.1  PHOS  --  2.9  --     GFR: Estimated Creatinine Clearance: 93.1 mL/min (by C-G formula based on SCr of 0.92 mg/dL).  Liver Function Tests: Recent Labs  Lab 01/10/25 1659  AST 21  ALT 19  ALKPHOS 57  BILITOT 1.6*  PROT 7.8  ALBUMIN 4.6    CBG: Recent Labs  Lab 01/11/25 1629 01/11/25 1924  01/11/25 2351 01/12/25 0400 01/12/25 0744  GLUCAP 115* 155* 132* 128* 105*     Recent Results (from the past 240 hours)  Blood culture (routine x 2)     Status: None (Preliminary result)   Collection Time: 01/10/25  6:45 PM   Specimen: BLOOD LEFT HAND  Result Value Ref Range Status   Specimen Description   Final    BLOOD LEFT HAND Performed at [facility], [address] [place], [place] [address]    Special Requests   Final    BOTTLES DRAWN AEROBIC AND ANAEROBIC Blood Culture adequate volume Performed at [facility], [address], [place], [place] [address]    Culture   Final    NO GROWTH 2 DAYS Performed at [facility], [address] [place], [place] [address]    Report Status PENDING  Incomplete  Blood culture (routine x 2)     Status: None (Preliminary result)   Collection Time: 01/10/25  6:45 PM   Specimen: BLOOD  Result Value Ref Range Status   Specimen Description   Final    BLOOD LEFT ANTECUBITAL Performed at [facility], [address] [place], [place] [address]    Special Requests   Final    BOTTLES DRAWN AEROBIC ONLY Blood Culture adequate volume Performed at [facility], [address] [place], [place] [address]    Culture   Final    NO GROWTH 2 DAYS Performed at [facility], [address], [place], [place] [address]    Report Status PENDING  Incomplete  Gastrointestinal Panel by PCR , Stool     Status: None   Collection Time: 01/11/25  9:37 PM   Specimen: Stool  Result Value Ref Range Status   Campylobacter species NOT DETECTED NOT DETECTED Final   Plesimonas shigelloides NOT DETECTED NOT DETECTED Final   Salmonella species NOT DETECTED NOT DETECTED Final   Yersinia enterocolitica NOT DETECTED NOT DETECTED Final   Vibrio species NOT DETECTED NOT DETECTED Final   Vibrio cholerae NOT DETECTED NOT DETECTED Final   Enteroaggregative E coli (EAEC) NOT DETECTED NOT  DETECTED Final   Enteropathogenic E coli (EPEC) NOT DETECTED NOT DETECTED Final   Enterotoxigenic E coli (ETEC) NOT DETECTED NOT DETECTED Final   Shiga like toxin producing E coli (STEC) NOT DETECTED NOT DETECTED Final   Shigella/Enteroinvasive E coli (EIEC) NOT DETECTED NOT DETECTED Final   Cryptosporidium NOT DETECTED NOT DETECTED Final   Cyclospora  cayetanensis NOT DETECTED NOT DETECTED Final   Entamoeba histolytica NOT DETECTED NOT DETECTED Final   Giardia lamblia NOT DETECTED NOT DETECTED Final   Adenovirus F40/41 NOT DETECTED NOT DETECTED Final   Astrovirus NOT DETECTED NOT DETECTED Final   Norovirus GI/GII NOT DETECTED NOT DETECTED Final   Rotavirus A NOT DETECTED NOT DETECTED Final   Sapovirus (I, II, IV, and V) NOT DETECTED NOT DETECTED Final    Comment: Performed at [facility], [address] [place], [place] [address]         Radiology Studies: ECHOCARDIOGRAM COMPLETE Result Date: 01/11/2025    ECHOCARDIOGRAM REPORT   Patient Name:   Shawn [name] Date of Exam: 01/11/2025 Medical Rec #:  [id number]     Height:       74.0 in Accession #:    [id number]    Weight:       204.5 lb Date of Birth:  11-04-1961    BSA:          2.194 m Patient Age:    63 years      BP:           126/76 mmHg Patient Gender: M             HR:           80 bpm. Exam Location:  Inpatient Procedure: 2D Echo, Cardiac Doppler and Color Doppler (Both Spectral and Color            Flow Doppler were utilized during procedure). Indications:    I48.92* Unspecified atrial flutter  History:        Patient has prior history of Echocardiogram examinations, most                 recent 07/04/2022. Risk Factors:Former Smoker and Hypertension.  Sonographer:    Merlynn Argyle Referring Phys: [id number] CAROLE N HALL IMPRESSIONS  1. Left ventricular ejection fraction, by estimation, is 55 to 60%. The left ventricle has normal function. The left ventricle has no regional wall motion abnormalities. There is mild left  ventricular hypertrophy. Left ventricular diastolic parameters are indeterminate.  2. Right ventricular systolic function is normal. The right ventricular size is normal. There is normal pulmonary artery systolic pressure.  3. The mitral valve is normal in structure. Trivial mitral valve regurgitation. No evidence of mitral stenosis.  4. The aortic valve is tricuspid. There is mild calcification of the aortic valve. Aortic valve regurgitation is not visualized. Aortic valve sclerosis/calcification is present, without any evidence of aortic stenosis.  5. Aortic dilatation noted. There is borderline dilatation of the aortic root, measuring 38 mm. There is borderline dilatation of the ascending aorta, measuring 38 mm.  6. The inferior vena cava is dilated in size with >50% respiratory variability, suggesting right atrial pressure of 8 mmHg. FINDINGS  Left Ventricle: Left ventricular ejection fraction, by estimation, is 55 to 60%. The left ventricle has normal function. The left ventricle has no regional wall motion abnormalities. The left ventricular internal cavity size was normal in size. There is  mild left ventricular hypertrophy. Left ventricular diastolic parameters are indeterminate. Right Ventricle: The right ventricular size is normal. No increase in right ventricular wall thickness. Right ventricular systolic function is normal. There is normal pulmonary artery systolic pressure. The tricuspid regurgitant velocity is 2.01 m/s, and  with an assumed right atrial pressure of 8 mmHg, the estimated right ventricular systolic pressure is 24.2 mmHg. Left Atrium: Left atrial size was normal in size. Right  Atrium: Right atrial size was normal in size. Pericardium: There is no evidence of pericardial effusion. Mitral Valve: The mitral valve is normal in structure. Trivial mitral valve regurgitation. No evidence of mitral valve stenosis. Tricuspid Valve: The tricuspid valve is normal in structure. Tricuspid valve  regurgitation is trivial. No evidence of tricuspid stenosis. Aortic Valve: The aortic valve is tricuspid. There is mild calcification of the aortic valve. Aortic valve regurgitation is not visualized. Aortic valve sclerosis/calcification is present, without any evidence of aortic stenosis. Pulmonic Valve: The pulmonic valve was normal in structure. Pulmonic valve regurgitation is trivial. No evidence of pulmonic stenosis. Aorta: Aortic dilatation noted. There is borderline dilatation of the aortic root, measuring 38 mm. There is borderline dilatation of the ascending aorta, measuring 38 mm. Venous: The inferior vena cava is dilated in size with greater than 50% respiratory variability, suggesting right atrial pressure of 8 mmHg. IAS/Shunts: No atrial level shunt detected by color flow Doppler.  LEFT VENTRICLE PLAX 2D LVIDd:         5.00 cm LVIDs:         3.60 cm LV PW:         1.20 cm LV IVS:        1.20 cm LVOT diam:     2.20 cm LV SV:         77 LV SV Index:   35 LVOT Area:     3.80 cm  RIGHT VENTRICLE             IVC RV Basal diam:  3.70 cm     IVC diam: 2.20 cm RV S prime:     14.80 cm/s TAPSE (M-mode): 1.8 cm LEFT ATRIUM             Index        RIGHT ATRIUM           Index LA diam:        3.70 cm 1.69 cm/m   RA Area:     11.00 cm LA Vol (A2C):   39.2 ml 17.87 ml/m  RA Volume:   19.10 ml  8.71 ml/m LA Vol (A4C):   43.5 ml 19.83 ml/m LA Biplane Vol: 43.2 ml 19.69 ml/m  AORTIC VALVE LVOT Vmax:   109.00 cm/s LVOT Vmean:  76.400 cm/s LVOT VTI:    0.203 m  AORTA Ao Root diam: 3.80 cm Ao Asc diam:  3.80 cm TRICUSPID VALVE TR Peak grad:   16.2 mmHg TR Vmax:        201.00 cm/s  SHUNTS Systemic VTI:  0.20 m Systemic Diam: 2.20 cm Toribio Fuel MD Electronically signed by Toribio Fuel MD Signature Date/Time: 01/11/2025/8:41:17 AM    Final    CT ABDOMEN PELVIS W CONTRAST Result Date: 01/10/2025 EXAM: CT ABDOMEN AND PELVIS WITH CONTRAST 01/10/2025 07:50:52 PM TECHNIQUE: CT of the abdomen and pelvis was  performed with the administration of 100 mL of iohexol  (OMNIPAQUE ) 300 MG/ML solution. Multiplanar reformatted images are provided for review. Automated exposure control, iterative reconstruction, and/or weight-based adjustment of the mA/kV was utilized to reduce the radiation dose to as low as reasonably achievable. COMPARISON: Comparison study 02/11/2023. CLINICAL HISTORY: Abdominal pain and diarrhea. FINDINGS: LOWER CHEST: No acute abnormality. LIVER: Fatty infiltration of the liver is noted. GALLBLADDER AND BILE DUCTS: The gallbladder is well distended. No dependent gallstones are seen. No biliary ductal dilatation. SPLEEN: The spleen appears within normal limits. PANCREAS: No acute abnormality. ADRENAL GLANDS: No acute abnormality. KIDNEYS, URETERS AND  BLADDER: The kidneys demonstrate no renal calculi or obstructive changes. Normal excretion is noted on delayed images. No perinephric or periureteral stranding. The bladder is well distended. GI AND BOWEL: Stomach and small bowel are within normal limits. No obstructive or inflammatory changes of the colon are seen. The appendix has been surgically removed. There is no bowel obstruction. PERITONEUM AND RETROPERITONEUM: No ascites. No free air. VASCULATURE: Aorta is normal in caliber. LYMPH NODES: No lymphadenopathy. REPRODUCTIVE ORGANS: No acute abnormality. BONES AND SOFT TISSUES: No acute osseous abnormality is seen. Stable SI joint sclerosis is seen. Postsurgical changes in the lower lumbar spine are noted. No focal soft tissue abnormality. IMPRESSION: 1. No acute findings in the abdomen or pelvis. Electronically signed by: Oneil Devonshire MD 01/10/2025 08:05 PM EST RP Workstation: [id number]        Scheduled Meds:  clobetasol  ointment  1 Application Topical Daily   doxycycline   100 mg Oral Daily   folic acid   1 mg Oral Daily   gabapentin   300 mg Oral Daily   And   gabapentin   600 mg Oral QHS   hydrocortisone   25 mg Rectal QHS   insulin  aspart  0-9  Units Subcutaneous Q4H   liver oil-zinc  oxide   Topical BID   [START ON 01/14/2025] methotrexate   15 mg Oral Weekly   metoprolol  tartrate  12.5 mg Oral BID   pantoprazole  (PROTONIX ) IV  40 mg Intravenous BID   predniSONE   10 mg Oral Q lunch   saccharomyces boulardii  250 mg Oral BID   triamcinolone  ointment  1 Application Topical Daily   Continuous Infusions:  sodium chloride  20 mL/hr at 01/12/25 0826     LOS: 0 days    Time spent: \40 minutes    Toribio Hummer, MD [facility]   To contact the attending provider between 7A-7P or the covering provider during after hours 7P-7A, please log into the web site [email or web address] and access using universal [place] password for that web site. If you do not have the password, please call the hospital operator.  01/12/2025, 11:29 AM    "

## 2025-01-12 NOTE — H&P (View-Only) (Signed)
 "    [facility] Progress Note  CC:  N/V, hematemesis   Subjective: He remains NPO after midnight for EGD today.  No nausea or vomiting.  No abdominal pain.  He endorsed passing a small dark brown to black loose stool yesterday evening around 9:30 PM without further recurrence since then.  No bright red blood per the rectum.  He stated having frequent urination without dysuria which started after midnight. No obvious hematuria. No chest pain or shortness of breath.   Objective:  Vital signs in last 24 hours: Temp:  [98.2 F (36.8 C)-98.7 F (37.1 C)] 98.2 F (36.8 C) (01/29 0402) Pulse Rate:  [89-98] 89 (01/29 0402) Resp:  [15-20] 15 (01/29 0402) BP: (116-123)/(70-84) 123/74 (01/29 0402) SpO2:  [95 %-96 %] 96 % (01/29 0402) Last BM Date : 01/11/25 General: Alert, well-developed in NAD Heart: Irregular rhythm.  No murmurs. Pulm: Breath sounds clear throughout.  On room air. Abdomen: Soft, nondistended.  Nontender.  Positive bowel sounds to all 4 quadrants. Extremities: No lower extremity edema. Neurologic:  Alert and oriented x 4. Grossly normal neurologically. Psych:  Alert and cooperative. Normal mood and affect.  Intake/Output from previous day: 01/28 0701 - 01/29 0700 In: 2158.9 [P.O.:600; I.V.:1558.9] Out: 1170 [Urine:1170] Intake/Output this shift: No intake/output data recorded.  Lab Results: Recent Labs    01/10/25 1659 01/11/25 0540 01/12/25 0649  WBC 18.2* 10.7* 15.8*  HGB 17.3* 14.9 14.3  HCT 52.3* 45.4 41.9  PLT 339 269 275   BMET Recent Labs    01/10/25 1659 01/11/25 0540 01/12/25 0649  NA 131* 137 139  K 4.7 4.3 3.9  CL 97* 104 103  CO2 21* 24 26  GLUCOSE 216* 95 92  BUN 20 14 14   CREATININE 0.93 0.92 0.92  CALCIUM 9.4 8.4* 8.8*   LFT Recent Labs    01/10/25 1659  PROT 7.8  ALBUMIN 4.6  AST 21  ALT 19  ALKPHOS 57  BILITOT 1.6*   PT/INR No results for input(s): LABPROT, INR in the last 72 hours. Hepatitis  Panel No results for input(s): HEPBSAG, HCVAB, HEPAIGM, HEPBIGM in the last 72 hours.  ECHOCARDIOGRAM COMPLETE Result Date: 01/11/2025    ECHOCARDIOGRAM REPORT   Patient Name:   LAVANTE TOSO Date of Exam: 01/11/2025 Medical Rec #:  [id number]     Height:       74.0 in Accession #:    [id number]    Weight:       204.5 lb Date of Birth:  March 05, 1961    BSA:          2.194 m Patient Age:    63 years      BP:           126/76 mmHg Patient Gender: M             HR:           80 bpm. Exam Location:  Inpatient Procedure: 2D Echo, Cardiac Doppler and Color Doppler (Both Spectral and Color            Flow Doppler were utilized during procedure). Indications:    I48.92* Unspecified atrial flutter  History:        Patient has prior history of Echocardiogram examinations, most                 recent 07/04/2022. Risk Factors:Former Smoker and Hypertension.  Sonographer:    Merlynn Argyle Referring Phys: [id number] CAROLE N HALL IMPRESSIONS  1. Left ventricular  ejection fraction, by estimation, is 55 to 60%. The left ventricle has normal function. The left ventricle has no regional wall motion abnormalities. There is mild left ventricular hypertrophy. Left ventricular diastolic parameters are indeterminate.  2. Right ventricular systolic function is normal. The right ventricular size is normal. There is normal pulmonary artery systolic pressure.  3. The mitral valve is normal in structure. Trivial mitral valve regurgitation. No evidence of mitral stenosis.  4. The aortic valve is tricuspid. There is mild calcification of the aortic valve. Aortic valve regurgitation is not visualized. Aortic valve sclerosis/calcification is present, without any evidence of aortic stenosis.  5. Aortic dilatation noted. There is borderline dilatation of the aortic root, measuring 38 mm. There is borderline dilatation of the ascending aorta, measuring 38 mm.  6. The inferior vena cava is dilated in size with >50% respiratory variability,  suggesting right atrial pressure of 8 mmHg. FINDINGS  Left Ventricle: Left ventricular ejection fraction, by estimation, is 55 to 60%. The left ventricle has normal function. The left ventricle has no regional wall motion abnormalities. The left ventricular internal cavity size was normal in size. There is  mild left ventricular hypertrophy. Left ventricular diastolic parameters are indeterminate. Right Ventricle: The right ventricular size is normal. No increase in right ventricular wall thickness. Right ventricular systolic function is normal. There is normal pulmonary artery systolic pressure. The tricuspid regurgitant velocity is 2.01 m/s, and  with an assumed right atrial pressure of 8 mmHg, the estimated right ventricular systolic pressure is 24.2 mmHg. Left Atrium: Left atrial size was normal in size. Right Atrium: Right atrial size was normal in size. Pericardium: There is no evidence of pericardial effusion. Mitral Valve: The mitral valve is normal in structure. Trivial mitral valve regurgitation. No evidence of mitral valve stenosis. Tricuspid Valve: The tricuspid valve is normal in structure. Tricuspid valve regurgitation is trivial. No evidence of tricuspid stenosis. Aortic Valve: The aortic valve is tricuspid. There is mild calcification of the aortic valve. Aortic valve regurgitation is not visualized. Aortic valve sclerosis/calcification is present, without any evidence of aortic stenosis. Pulmonic Valve: The pulmonic valve was normal in structure. Pulmonic valve regurgitation is trivial. No evidence of pulmonic stenosis. Aorta: Aortic dilatation noted. There is borderline dilatation of the aortic root, measuring 38 mm. There is borderline dilatation of the ascending aorta, measuring 38 mm. Venous: The inferior vena cava is dilated in size with greater than 50% respiratory variability, suggesting right atrial pressure of 8 mmHg. IAS/Shunts: No atrial level shunt detected by color flow Doppler.  LEFT  VENTRICLE PLAX 2D LVIDd:         5.00 cm LVIDs:         3.60 cm LV PW:         1.20 cm LV IVS:        1.20 cm LVOT diam:     2.20 cm LV SV:         77 LV SV Index:   35 LVOT Area:     3.80 cm  RIGHT VENTRICLE             IVC RV Basal diam:  3.70 cm     IVC diam: 2.20 cm RV S prime:     14.80 cm/s TAPSE (M-mode): 1.8 cm LEFT ATRIUM             Index        RIGHT ATRIUM           Index LA diam:  3.70 cm 1.69 cm/m   RA Area:     11.00 cm LA Vol (A2C):   39.2 ml 17.87 ml/m  RA Volume:   19.10 ml  8.71 ml/m LA Vol (A4C):   43.5 ml 19.83 ml/m LA Biplane Vol: 43.2 ml 19.69 ml/m  AORTIC VALVE LVOT Vmax:   109.00 cm/s LVOT Vmean:  76.400 cm/s LVOT VTI:    0.203 m  AORTA Ao Root diam: 3.80 cm Ao Asc diam:  3.80 cm TRICUSPID VALVE TR Peak grad:   16.2 mmHg TR Vmax:        201.00 cm/s  SHUNTS Systemic VTI:  0.20 m Systemic Diam: 2.20 cm Toribio Fuel MD Electronically signed by Toribio Fuel MD Signature Date/Time: 01/11/2025/8:41:17 AM    Final    CT ABDOMEN PELVIS W CONTRAST Result Date: 01/10/2025 EXAM: CT ABDOMEN AND PELVIS WITH CONTRAST 01/10/2025 07:50:52 PM TECHNIQUE: CT of the abdomen and pelvis was performed with the administration of 100 mL of iohexol  (OMNIPAQUE ) 300 MG/ML solution. Multiplanar reformatted images are provided for review. Automated exposure control, iterative reconstruction, and/or weight-based adjustment of the mA/kV was utilized to reduce the radiation dose to as low as reasonably achievable. COMPARISON: Comparison study 02/11/2023. CLINICAL HISTORY: Abdominal pain and diarrhea. FINDINGS: LOWER CHEST: No acute abnormality. LIVER: Fatty infiltration of the liver is noted. GALLBLADDER AND BILE DUCTS: The gallbladder is well distended. No dependent gallstones are seen. No biliary ductal dilatation. SPLEEN: The spleen appears within normal limits. PANCREAS: No acute abnormality. ADRENAL GLANDS: No acute abnormality. KIDNEYS, URETERS AND BLADDER: The kidneys demonstrate no renal  calculi or obstructive changes. Normal excretion is noted on delayed images. No perinephric or periureteral stranding. The bladder is well distended. GI AND BOWEL: Stomach and small bowel are within normal limits. No obstructive or inflammatory changes of the colon are seen. The appendix has been surgically removed. There is no bowel obstruction. PERITONEUM AND RETROPERITONEUM: No ascites. No free air. VASCULATURE: Aorta is normal in caliber. LYMPH NODES: No lymphadenopathy. REPRODUCTIVE ORGANS: No acute abnormality. BONES AND SOFT TISSUES: No acute osseous abnormality is seen. Stable SI joint sclerosis is seen. Postsurgical changes in the lower lumbar spine are noted. No focal soft tissue abnormality. IMPRESSION: 1. No acute findings in the abdomen or pelvis. Electronically signed by: Oneil Devonshire MD 01/10/2025 08:05 PM EST RP Workstation: [id number]    Assessment / Plan:  64 year old male presenting with N/V with hematemesis x 2 episodes.  Patient passed 1 small dark brown/black stool at 9:30 PM 1/28. No abdominal pain.  CTAP with contrast did not identify any intra-abdominal/pelvic pathology to explain symptoms. - NPO  - Cardiac clearance received per cardiology and okay to hold Heparin  IV x 4 hours prior to EGD - Patient to proceed with EGD at 12:30 PM today as scheduled.  Heparin  IV on hold since around 8:15 AM. - Continue PPI IV bid - Ondansetron  4 mg PR IV every 6 hours as needed   GERD.  EGD 02/2023 showed reflux esophagitis, 3 cm hiatal hernia and gastritis. - See plan above    Diarrhea x 1 episode.  Patient passed 1 small dark brown/black stool yesterday evening.  GI pathogen panel pending. - Nursing staff to document BMs every shift  Increased urinary frequency, started after midnight.  -Management per the hospitalist  Leukocytosis, WBC 18.2 -> 10.7 -> 15.8. Afebrile.    Rectal bleeding, likely from internal and external hemorrhoids.  Colonoscopy 02/2023 identified 1 sessile serrated  polyp removed from the colon,  diverticulosis to the left colon and internal/external hemorrhoids. - Desitin three times daily as needed for anal or hemorrhoidal irritation/bleeding.  - Anusol   25mg  suppository 1 PR nightly x 3 nights - Further management as an outpatient   Atrial flutter/Atrial fib. CHA2DS2-VASc Score = 2.  ECHO 01/11/2025 showed LVEF 55 to 60%.  IV Heparin  on hold due to EGD today at 12:30 PM. - Management per cardiology    Wegener's granulomatosis and leukocytoclastic vasculitis. On Methotrexate  Prednisone  and Doxycycline  per rheumatology.   DM type II  Principal Problem:   Atrial flutter (HCC) Active Problems:   Hematemesis of fresh blood   Hematochezia   Preprocedural cardiovascular examination   Bilateral sensorineural hearing loss   Benign hypertension   New onset atrial flutter (HCC)   Nausea vomiting and diarrhea   Diabetes mellitus type 2, controlled (HCC)     LOS: 0 days   Elida CHRISTELLA Shawl  01/12/2025, 8:39 AM   I have taken a history, reviewed the chart and examined the patient. I performed a substantive portion of this encounter (greater than 50%), including complete performance of at least one of the key components, in conjunction with the APP. I agree with the APP's note, impression and recommendations with additional input as follows.  Proceed with EGD today  Estefana Kidney, MD "

## 2025-01-12 NOTE — Telephone Encounter (Signed)
 Patient Product/process Development Scientist completed.    The patient is insured through [employer]. Patient has Medicare and is not eligible for a copay card, but may be able to apply for patient assistance or Medicare RX Payment Plan (Patient Must reach out to their plan, if eligible for payment plan), if available.    Ran test claim for Eliquis  5 mg and the current 30 day co-pay is $249.70 due to a deductible.   This test claim was processed through [facility]- copay amounts may vary at other pharmacies due to pharmacy/plan contracts, or as the patient moves through the different stages of their insurance plan.     Reyes Sharps, CPHT Pharmacy Technician Patient Advocate Specialist Lead [facility] Patient Advocate Team Direct Number: [phone]  Fax: [phone]

## 2025-01-12 NOTE — Interval H&P Note (Signed)
 History and Physical Interval Note:  01/12/2025 12:28 PM  Shawn [name]  has presented today for surgery, with the diagnosis of Hematemesis.  The various methods of treatment have been discussed with the patient and family. After consideration of risks, benefits and other options for treatment, the patient has consented to  Procedures: EGD (ESOPHAGOGASTRODUODENOSCOPY) (N/A) as a surgical intervention.  The patient's history has been reviewed, patient examined, no change in status, stable for surgery.  I have reviewed the patient's chart and labs.  Questions were answered to the patient's satisfaction.     Tiona Ruane C Ferris Fielden

## 2025-01-12 NOTE — Op Note (Addendum)
 [facility] Patient Name: Shawn [name] Procedure Date: 01/12/2025 MRN: [id number] Attending MD: Rosario Estefana Kidney , , [id number] Date of Birth: 1961-10-13 CSN: [id number] Age: 64 Admit Type: Inpatient Procedure:                Upper GI endoscopy Indications:              Hematemesis Providers:                Rosario Estefana Kidney, Darleene Bare, RN, Haskel Chris, Technician Referring MD:             Hospitalist team Medicines:                Monitored Anesthesia Care Complications:            No immediate complications. Estimated Blood Loss:     Estimated blood loss was minimal. Procedure:                Pre-Anesthesia Assessment:                           - Prior to the procedure, a History and Physical                            was performed, and patient medications and                            allergies were reviewed. The patient's tolerance of                            previous anesthesia was also reviewed. The risks                            and benefits of the procedure and the sedation                            options and risks were discussed with the patient.                            All questions were answered, and informed consent                            was obtained. Prior Anticoagulants: The patient has                            taken heparin , last dose was day of procedure. ASA                            Grade Assessment: III - A patient with severe                            systemic disease. After reviewing the risks and  benefits, the patient was deemed in satisfactory                            condition to undergo the procedure.                           After obtaining informed consent, the endoscope was                            passed under direct vision. Throughout the                            procedure, the patient's blood pressure, pulse, and                             oxygen saturations were monitored continuously. The                            [id number]-[id number] ([id number]) Olympus endoscope was introduced                            through the mouth, and advanced to the second part                            of duodenum. The upper GI endoscopy was                            accomplished without difficulty. The patient                            tolerated the procedure well. Scope In: Scope Out: Findings:      The examined esophagus was normal.      A medium-sized hiatal hernia was present.      Many non-bleeding linear gastric ulcers with a clean ulcer base (Forrest       Class III) were found in the gastric antrum. The largest lesion was 12       mm in largest dimension. Biopsies were taken with a cold forceps for       histology.      The examined duodenum was normal. Impression:               - Normal esophagus.                           - Medium-sized hiatal hernia.                           - Non-bleeding gastric ulcers with a clean ulcer                            base (Forrest Class III). Biopsied.                           - Normal examined duodenum. Moderate Sedation:      Not Applicable - Patient had care per Anesthesia. Recommendation:           -  Return patient to hospital ward for ongoing care.                           - Await pathology results.                           - Use a proton pump inhibitor PO BID for 2 months.                           - Use sucralfate  suspension 1 gram PO BID for 4                            weeks. Please take this 1.5 hours away from the                            patient's other medications.                           - Avoid NSAIDs.                           - Repeat upper endoscopy in 2 months to check                            healing.                           - Okay to restart IV heparin  gtt later today. Will                            place pharmacy consult to assist with this.                           -  The findings and recommendations were discussed                            with the patient. Procedure Code(s):        --- Professional ---                           [id number], Esophagogastroduodenoscopy, flexible,                            transoral; with biopsy, single or multiple Diagnosis Code(s):        --- Professional ---                           K44.9, Diaphragmatic hernia without obstruction or                            gangrene                           K25.9, Gastric ulcer, unspecified as acute or  chronic, without hemorrhage or perforation                           K92.0, Hematemesis CPT copyright 2022 American Medical Association. All rights reserved. The codes documented in this report are preliminary and upon coder review may  be revised to meet current compliance requirements. Dr Estefana Federico Rosario Estefana Federico,  01/12/2025 1:31:40 PM Number of Addenda: 0

## 2025-01-12 NOTE — Discharge Instructions (Addendum)

## 2025-01-13 ENCOUNTER — Inpatient Hospital Stay (HOSPITAL_COMMUNITY)

## 2025-01-13 ENCOUNTER — Ambulatory Visit: Payer: Self-pay | Admitting: Internal Medicine

## 2025-01-13 DIAGNOSIS — E119 Type 2 diabetes mellitus without complications: Secondary | ICD-10-CM | POA: Diagnosis not present

## 2025-01-13 DIAGNOSIS — K921 Melena: Secondary | ICD-10-CM | POA: Diagnosis not present

## 2025-01-13 DIAGNOSIS — K253 Acute gastric ulcer without hemorrhage or perforation: Secondary | ICD-10-CM | POA: Diagnosis not present

## 2025-01-13 DIAGNOSIS — R112 Nausea with vomiting, unspecified: Secondary | ICD-10-CM | POA: Diagnosis not present

## 2025-01-13 DIAGNOSIS — R197 Diarrhea, unspecified: Secondary | ICD-10-CM | POA: Diagnosis not present

## 2025-01-13 DIAGNOSIS — I10 Essential (primary) hypertension: Secondary | ICD-10-CM | POA: Diagnosis not present

## 2025-01-13 DIAGNOSIS — E079 Disorder of thyroid, unspecified: Secondary | ICD-10-CM | POA: Diagnosis not present

## 2025-01-13 DIAGNOSIS — N3000 Acute cystitis without hematuria: Secondary | ICD-10-CM

## 2025-01-13 DIAGNOSIS — K259 Gastric ulcer, unspecified as acute or chronic, without hemorrhage or perforation: Secondary | ICD-10-CM | POA: Diagnosis not present

## 2025-01-13 DIAGNOSIS — H903 Sensorineural hearing loss, bilateral: Secondary | ICD-10-CM | POA: Diagnosis not present

## 2025-01-13 DIAGNOSIS — I4892 Unspecified atrial flutter: Secondary | ICD-10-CM | POA: Diagnosis not present

## 2025-01-13 DIAGNOSIS — K920 Hematemesis: Secondary | ICD-10-CM | POA: Diagnosis not present

## 2025-01-13 LAB — GLUCOSE, CAPILLARY
Glucose-Capillary: 94 mg/dL (ref 70–99)
Glucose-Capillary: 90 mg/dL (ref 70–99)
Glucose-Capillary: 98 mg/dL (ref 70–99)
Glucose-Capillary: 131 mg/dL — ABNORMAL HIGH (ref 70–99)
Glucose-Capillary: 153 mg/dL — ABNORMAL HIGH (ref 70–99)

## 2025-01-13 LAB — BASIC METABOLIC PANEL WITH GFR
Anion gap: 11 (ref 5–15)
BUN: 11 mg/dL (ref 8–23)
CO2: 23 mmol/L (ref 22–32)
Calcium: 8.3 mg/dL — ABNORMAL LOW (ref 8.9–10.3)
Chloride: 100 mmol/L (ref 98–111)
Creatinine, Ser: 0.99 mg/dL (ref 0.61–1.24)
GFR, Estimated: >60 mL/min
Glucose, Bld: 92 mg/dL (ref 70–99)
Potassium: 3.7 mmol/L (ref 3.5–5.1)
Sodium: 134 mmol/L — ABNORMAL LOW (ref 135–145)

## 2025-01-13 LAB — CBC
HCT: 40.2 % (ref 39.0–52.0)
Hemoglobin: 13.4 g/dL (ref 13.0–17.0)
MCH: 32.6 pg (ref 26.0–34.0)
MCHC: 33.3 g/dL (ref 30.0–36.0)
MCV: 97.8 fL (ref 80.0–100.0)
Platelets: 243 10*3/uL (ref 150–400)
RBC: 4.11 MIL/uL — ABNORMAL LOW (ref 4.22–5.81)
RDW: 15.7 % — ABNORMAL HIGH (ref 11.5–15.5)
WBC: 19.2 10*3/uL — ABNORMAL HIGH (ref 4.0–10.5)
nRBC: 0 % (ref 0.0–0.2)

## 2025-01-13 LAB — MAGNESIUM: Magnesium: 1.9 mg/dL (ref 1.7–2.4)

## 2025-01-13 MED ORDER — METOPROLOL TARTRATE 25 MG PO TABS
25.0000 mg | ORAL_TABLET | Freq: Two times a day (BID) | ORAL | Status: DC
  Administered 2025-01-13 – 2025-01-14 (×3): 25 mg via ORAL
  Filled 2025-01-13 (×3): qty 1

## 2025-01-13 NOTE — Progress Notes (Addendum)
 "    [facility] Progress Note  CC:   N/V, hematemesis   Subjective: Endorses having mild upper abdominal soreness, no significant abdominal pain.  No nausea or vomiting.  Post EGD he passed 1, dark brown stool. No melena or bright red blood per rectum.  No chest pain.  He has a mild cough with productive clear to white phlegm, which he stated was chronic.  Transport at the bedside to take patient down to radiology for a chest x-ray.  Objective:   EGD 01/12/2025: - Normal esophagus. - Medium-sized hiatal hernia. - Non-bleeding gastric ulcers with a clean ulcer base (Forrest Class III). Biopsied.  - Normal examined duodenum.  Vital signs in last 24 hours: Temp:  [98.2 F (36.8 C)-99.8 F (37.7 C)] 98.2 F (36.8 C) (01/30 0517) Pulse Rate:  [76-110] 110 (01/30 0517) Resp:  [15-33] 15 (01/30 0517) BP: (90-143)/(53-82) 143/81 (01/30 0517) SpO2:  [94 %-98 %] 95 % (01/30 0517) Last BM Date : 01/11/25 General: Alert 64 year old male, mild facial flushing noted at this time.  In no acute distress. Heart: Mildly tachycardic, rhythm intermittently irregular, no murmur. Pulm: Breath sounds clear. Abdomen: Soft, nondistended.  Nontender.  Positive bowel sounds all 4 quadrants. Extremities: No lower extremity edema. Neurologic:  Alert and oriented x 4. Grossly normal neurologically. Psych:  Alert and cooperative. Normal mood and affect.  Intake/Output from previous day: 01/29 0701 - 01/30 0700 In: 1653 [P.O.:200; I.V.:1353; IV Piggyback:100] Out: 675 [Urine:675] Intake/Output this shift: No intake/output data recorded.  Lab Results: Recent Labs    01/11/25 0540 01/12/25 0649 01/13/25 0634  WBC 10.7* 15.8* 19.2*  HGB 14.9 14.3 13.4  HCT 45.4 41.9 40.2  PLT 269 275 243   BMET Recent Labs    01/11/25 0540 01/12/25 0649 01/13/25 0634  NA 137 139 134*  K 4.3 3.9 3.7  CL 104 103 100  CO2 24 26 23   GLUCOSE 95 92 92  BUN 14 14 11   CREATININE 0.92 0.92 0.99   CALCIUM 8.4* 8.8* 8.3*   LFT Recent Labs    01/10/25 1659  PROT 7.8  ALBUMIN 4.6  AST 21  ALT 19  ALKPHOS 57  BILITOT 1.6*   PT/INR No results for input(s): LABPROT, INR in the last 72 hours. Hepatitis Panel No results for input(s): HEPBSAG, HCVAB, HEPAIGM, HEPBIGM in the last 72 hours.  No results found.  Assessment / Plan:  64 year old male admitted 01/10/2025 with N/V and hematemesis x 2 episodes. CTAP with contrast did not identify any intra-abdominal/pelvic pathology to explain symptoms. EGD 1/29 identified a medium sized hiatal hernia and a nonbleeding gastric ulcer with a clean base.  Hemoglobin 14.3 -> 13.4.  - Await path report - Pantoprazole  40 mg p.o. twice daily for 2 months - Sucralfate  suspension 1 g p.o. twice daily for 4 weeks - Ondansetron  4 mg PR IV every 6 hours as needed - Repeat EGD to assess for ulcer healing in 2 months - Await further recommendations per Dr. Federico   GERD - See plan above   Leukocytosis, UTI.  WBC 18.2 -> 10.7 -> 15.8 -> 19.2.  Urinalysis with large leukocytes, positive nitrite and moderate hemoglobin 1/29, urine cultures pending.  Blood cultures collected.  Temp 98.2.  Heart rate 110 b/m.  BP 143/81.  Started on Ceftriaxone  2 g IV every 24 hours 1/29. - Management per the hospitalist   Rectal bleeding, likely from internal and external hemorrhoids.  Colonoscopy 02/2023 identified 1 sessile serrated polyp  removed from the colon, diverticulosis to the left colon and internal/external hemorrhoids. - Desitin three times daily as needed for anal or hemorrhoidal irritation/bleeding.  - Anusol   25mg  suppository 1 PR nightly x 3 nights - Further management as an outpatient   Atrial flutter/Atrial fib. CHA2DS2-VASc Score = 2.  ECHO 01/11/2025 showed LVEF 55 to 60%.  Initially on heparin  IV then transition to Eliquis  1/29. - Management per cardiology   Cough, chronic -Chest x-ray ordered by the hospitalist   Wegener's  granulomatosis and leukocytoclastic vasculitis. On Methotrexate  Prednisone  and Doxycycline  per rheumatology.   DM type II       Principal Problem:   Atrial flutter (HCC) Active Problems:   Hematemesis of fresh blood   Hematochezia   Preprocedural cardiovascular examination   Bilateral sensorineural hearing loss   Benign hypertension   New onset atrial flutter (HCC)   Nausea vomiting and diarrhea   Diabetes mellitus type 2, controlled (HCC)   Frequency of urination   Dysuria   UTI (urinary tract infection)     LOS: 1 day   Elida HERO Kennedy-Smith  01/13/2025, 9:07 AM  I have taken a history, reviewed the chart and examined the patient. I performed a substantive portion of this encounter (greater than 50%), including complete performance of at least one of the key components, in conjunction with the APP. I agree with the APP's note, impression and recommendations with additional input as follows.  Hematemesis was due to gastric ulcers. Will maintain on PPI and sucralfate  therapy. Patient was started on Eliquis  yesterday. Will follow up gastric biopsies taken to see if patient has H pylori. We will arrange for GI clinic follow up. Repeat EGD will be planned for 2 months from now. GI will sign off for now. Please call back if any new questions arise.  Estefana Kidney, MD  "

## 2025-01-13 NOTE — Progress Notes (Addendum)
 " Progress Note  Patient Name: Shawn [name] Date of Encounter: 01/13/2025  Primary Cardiologist: Stanly DELENA Leavens, MD  Subjective   Feeling miserable with UTI symptoms - frequency, dysuria, feeling tired this morning like he can't stay awake, some nausea. No recurrent s/sx of GIB. WBC rising. Wife reports he had a thyroid  bx about a month ago with non-cancerous nodules. She confirms symptoms of apnea without prior OSA eval.  Inpatient Medications    Scheduled Meds:  apixaban   5 mg Oral BID   clobetasol  ointment  1 Application Topical Daily   doxycycline   100 mg Oral Daily   folic acid   1 mg Oral Daily   gabapentin   300 mg Oral Daily   And   gabapentin   600 mg Oral QHS   hydrocortisone   25 mg Rectal QHS   insulin  aspart  0-9 Units Subcutaneous Q4H   liver oil-zinc  oxide   Topical BID   [START ON 01/14/2025] methotrexate   15 mg Oral Weekly   metoprolol  tartrate  12.5 mg Oral BID   pantoprazole  (PROTONIX ) IV  40 mg Intravenous BID   predniSONE   10 mg Oral Q lunch   saccharomyces boulardii  250 mg Oral BID   sucralfate   1 g Oral BID   triamcinolone  ointment  1 Application Topical Daily   Continuous Infusions:  sodium chloride  125 mL/hr at 01/13/25 0658   cefTRIAXone  (ROCEPHIN )  IV 2 g (01/12/25 2058)   PRN Meds: acetaminophen , melatonin, mouth rinse, oxyCODONE , prochlorperazine    Vital Signs    Vitals:   01/12/25 1340 01/12/25 1401 01/12/25 2016 01/13/25 0517  BP: 105/65 115/78 132/76 (!) 143/81  Pulse: 78 84 76 (!) 110  Resp: (!) 26 18 15 15   Temp:  98.9 F (37.2 C) 99.7 F (37.6 C) 98.2 F (36.8 C)  TempSrc:  Oral    SpO2: 94% 98% 97% 95%  Weight:      Height:        Intake/Output Summary (Last 24 hours) at 01/13/2025 0756 Last data filed at 01/12/2025 2300 Gross per 24 hour  Intake 1652.98 ml  Output 675 ml  Net 977.98 ml      01/10/2025   11:17 PM 07/29/2024    6:08 AM 06/08/2024    1:42 PM  Last 3 Weights  Weight (lbs) 204 lb 8 oz 204 lb 204 lb   Weight (kg) 92.761 kg 92.534 kg 92.534 kg     Telemetry    Atrial flutter 80s-low 100s - Personally Reviewed  Physical Exam   GEN: No acute distress.  HEENT: Normocephalic, atraumatic, sclera non-icteric. Neck: No JVD or bruits. Cardiac: irregular, borderline elevated rate, no murmurs, rubs, or gallops.  Respiratory: Clear to auscultation bilaterally. Breathing is unlabored. GI: Soft, nontender, non-distended, BS +x 4. MS: no deformity. Extremities: No clubbing or cyanosis. No edema. Chronic appearing mottling which wife states is chronic due to vasculitis. Distal pedal pulses are 2+ and equal bilaterally. Neuro:  Sleeping when I arrived but easily arousable. AAOx3. Follows commands. Psych:  Responds to questions appropriately with a normal affect.  Labs    High Sensitivity Troponin:  No results for input(s): TROPONINIHS in the last 720 hours.    Cardiac EnzymesNo results for input(s): TROPONINI in the last 168 hours. No results for input(s): TROPIPOC in the last 168 hours.   Chemistry Recent Labs  Lab 01/10/25 1659 01/11/25 0540 01/12/25 0649  NA 131* 137 139  K 4.7 4.3 3.9  CL 97* 104 103  CO2 21* 24  26  GLUCOSE 216* 95 92  BUN 20 14 14   CREATININE 0.93 0.92 0.92  CALCIUM 9.4 8.4* 8.8*  PROT 7.8  --   --   ALBUMIN 4.6  --   --   AST 21  --   --   ALT 19  --   --   ALKPHOS 57  --   --   BILITOT 1.6*  --   --   GFRNONAA >60 >60 >60  ANIONGAP 13 10 10      Hematology Recent Labs  Lab 01/11/25 0540 01/12/25 0649 01/13/25 0634  WBC 10.7* 15.8* 19.2*  RBC 4.64 4.37 4.11*  HGB 14.9 14.3 13.4  HCT 45.4 41.9 40.2  MCV 97.8 95.9 97.8  MCH 32.1 32.7 32.6  MCHC 32.8 34.1 33.3  RDW 15.2 15.2 15.7*  PLT 269 275 243    BNPNo results for input(s): BNP, PROBNP in the last 168 hours.   DDimer No results for input(s): DDIMER in the last 168 hours.   Radiology    ECHOCARDIOGRAM COMPLETE Result Date: 01/11/2025    ECHOCARDIOGRAM REPORT   Patient  Name:   ZYAIR RUSSI Date of Exam: 01/11/2025 Medical Rec #:  [id number]     Height:       74.0 in Accession #:    [id number]    Weight:       204.5 lb Date of Birth:  1961/06/22    BSA:          2.194 m Patient Age:    64 years      BP:           126/76 mmHg Patient Gender: M             HR:           80 bpm. Exam Location:  Inpatient Procedure: 2D Echo, Cardiac Doppler and Color Doppler (Both Spectral and Color            Flow Doppler were utilized during procedure). Indications:    I48.92* Unspecified atrial flutter  History:        Patient has prior history of Echocardiogram examinations, most                 recent 07/04/2022. Risk Factors:Former Smoker and Hypertension.  Sonographer:    Merlynn Argyle Referring Phys: [id number] CAROLE N HALL IMPRESSIONS  1. Left ventricular ejection fraction, by estimation, is 55 to 60%. The left ventricle has normal function. The left ventricle has no regional wall motion abnormalities. There is mild left ventricular hypertrophy. Left ventricular diastolic parameters are indeterminate.  2. Right ventricular systolic function is normal. The right ventricular size is normal. There is normal pulmonary artery systolic pressure.  3. The mitral valve is normal in structure. Trivial mitral valve regurgitation. No evidence of mitral stenosis.  4. The aortic valve is tricuspid. There is mild calcification of the aortic valve. Aortic valve regurgitation is not visualized. Aortic valve sclerosis/calcification is present, without any evidence of aortic stenosis.  5. Aortic dilatation noted. There is borderline dilatation of the aortic root, measuring 38 mm. There is borderline dilatation of the ascending aorta, measuring 38 mm.  6. The inferior vena cava is dilated in size with >50% respiratory variability, suggesting right atrial pressure of 8 mmHg. FINDINGS  Left Ventricle: Left ventricular ejection fraction, by estimation, is 55 to 60%. The left ventricle has normal function. The left  ventricle has no regional wall motion abnormalities. The left ventricular internal cavity size was  normal in size. There is  mild left ventricular hypertrophy. Left ventricular diastolic parameters are indeterminate. Right Ventricle: The right ventricular size is normal. No increase in right ventricular wall thickness. Right ventricular systolic function is normal. There is normal pulmonary artery systolic pressure. The tricuspid regurgitant velocity is 2.01 m/s, and  with an assumed right atrial pressure of 8 mmHg, the estimated right ventricular systolic pressure is 24.2 mmHg. Left Atrium: Left atrial size was normal in size. Right Atrium: Right atrial size was normal in size. Pericardium: There is no evidence of pericardial effusion. Mitral Valve: The mitral valve is normal in structure. Trivial mitral valve regurgitation. No evidence of mitral valve stenosis. Tricuspid Valve: The tricuspid valve is normal in structure. Tricuspid valve regurgitation is trivial. No evidence of tricuspid stenosis. Aortic Valve: The aortic valve is tricuspid. There is mild calcification of the aortic valve. Aortic valve regurgitation is not visualized. Aortic valve sclerosis/calcification is present, without any evidence of aortic stenosis. Pulmonic Valve: The pulmonic valve was normal in structure. Pulmonic valve regurgitation is trivial. No evidence of pulmonic stenosis. Aorta: Aortic dilatation noted. There is borderline dilatation of the aortic root, measuring 38 mm. There is borderline dilatation of the ascending aorta, measuring 38 mm. Venous: The inferior vena cava is dilated in size with greater than 50% respiratory variability, suggesting right atrial pressure of 8 mmHg. IAS/Shunts: No atrial level shunt detected by color flow Doppler.  LEFT VENTRICLE PLAX 2D LVIDd:         5.00 cm LVIDs:         3.60 cm LV PW:         1.20 cm LV IVS:        1.20 cm LVOT diam:     2.20 cm LV SV:         77 LV SV Index:   35 LVOT Area:      3.80 cm  RIGHT VENTRICLE             IVC RV Basal diam:  3.70 cm     IVC diam: 2.20 cm RV S prime:     14.80 cm/s TAPSE (M-mode): 1.8 cm LEFT ATRIUM             Index        RIGHT ATRIUM           Index LA diam:        3.70 cm 1.69 cm/m   RA Area:     11.00 cm LA Vol (A2C):   39.2 ml 17.87 ml/m  RA Volume:   19.10 ml  8.71 ml/m LA Vol (A4C):   43.5 ml 19.83 ml/m LA Biplane Vol: 43.2 ml 19.69 ml/m  AORTIC VALVE LVOT Vmax:   109.00 cm/s LVOT Vmean:  76.400 cm/s LVOT VTI:    0.203 m  AORTA Ao Root diam: 3.80 cm Ao Asc diam:  3.80 cm TRICUSPID VALVE TR Peak grad:   16.2 mmHg TR Vmax:        201.00 cm/s  SHUNTS Systemic VTI:  0.20 m Systemic Diam: 2.20 cm Toribio Fuel MD Electronically signed by Toribio Fuel MD Signature Date/Time: 01/11/2025/8:41:17 AM    Final     Cardiac Studies   2d echo 01/11/25   1. Left ventricular ejection fraction, by estimation, is 55 to 60%. The  left ventricle has normal function. The left ventricle has no regional  wall motion abnormalities. There is mild left ventricular hypertrophy.  Left ventricular diastolic parameters  are  indeterminate.   2. Right ventricular systolic function is normal. The right ventricular  size is normal. There is normal pulmonary artery systolic pressure.   3. The mitral valve is normal in structure. Trivial mitral valve  regurgitation. No evidence of mitral stenosis.   4. The aortic valve is tricuspid. There is mild calcification of the  aortic valve. Aortic valve regurgitation is not visualized. Aortic valve  sclerosis/calcification is present, without any evidence of aortic  stenosis.   5. Aortic dilatation noted. There is borderline dilatation of the aortic  root, measuring 38 mm. There is borderline dilatation of the ascending  aorta, measuring 38 mm.   6. The inferior vena cava is dilated in size with >50% respiratory  variability, suggesting right atrial pressure of 8 mmHg.     Patient Profile     64 y.o. male with  atrial tachycardia, PVCs (2.4% in 2024), hypertension, MGUS, bilateral sensorineural hearing loss, GERD, leukocytoclastic vasculitis, Wegener's granulomatosis, anal fissure, hemorrhoids and colon polyps. cMRI 2023 not consistent with cardiac amyloidosis, left atrial echodensity is likely prominent coumadin ridge (normal variant structure). Presented to ED with intracrable nausea/vomiting and abdominal pain, found to have hematemesis. Also noted to be in new onset atrial flutter.  Assessment & Plan    1. Nausea, vomiting, hematemesis - EGD with medium hiatal hernia, nonbleeding gastric ulcers - being managed by GI with BID PPI, sucralfate , avoidance of NSAIDs - recommended to have repeat EGD in 2 months to check healing, will need to take this into account when considering DCCV plans   2. New onset atrial flutter, prior hx of A-tach and PVCs - on Toprol  50mg  daily PTA, now on Lopressor  12.5mg  BID - will inch up to Lopressor  25mg  BID with plan to consolidate at DC once clinically stable - consider OP sleep study given sleep disordered breathing - see below re: thyroid  - restarted on oral anticoagulation last night (confirmed OK with GI via secure chat today). Will need to follow to ensure he tolerates as outpatient before committing to DCCV (no acute indication to proceed) -> OK to switch back to IV heparin  if nausea prohibits oral meds - echo with EF 55-60%, mild LVH, trivial MR, aortic sclerosis without stenosis   3. Abnormal TSH, suspected subclinical hyperthyroidism - TSH suppressed this admission as well as in 2024 as well, suggestive of subclinical hyperthyroidism. Literature suggests investigation of underlying causes and consideration of treatment given that this can have implications with atrial arrhythmias  - patient's wife reports he has known thyroid  nodules s/p bx a month ago that was non-cancerous, recommended to monitor for now - further mgmt per IM - consider discussion with  endocrinology re: treatment since outpatient referrals can be booked out almost a year  4. Borderline dilation of aortic root/ascending aorta - anticipate routine OP f/u (consider echo 1 yr)   5. HTN - BP beginning to normalize/elevate, follow with med plan above  6. UTI, leukocytosis - patient's WBC is rising and he reports significant dysuria and frequency along with malaise, nausea and lethargy - relayed their concerns to IM   Remainder per primary  Has AF clinic f/u scheduled 02/01/25 and Dr. Santo in 03/2025.  For questions or updates, please contact [facility] Please consult [email or web address] for contact info under Cardiology/STEMI.  Signed, Dayna N Dunn, PA-C 01/13/2025, 7:56 AM    ADDENDUM:   Patient seen and examined with DAYNA N DUNN, PA-C.  I personally taken a history, examined the patient, reviewed relevant notes,  laboratory data / imaging studies.  I performed a substantive portion of this encounter and formulated the important aspects of the plan.  I agree with the APP's note, impression, and recommendations; however, I have edited the note to reflect changes or salient points.   Patient seen and examined at bedside. Hemodynamic stable  PHYSICAL EXAM: Today's Vitals   01/12/25 2016 01/13/25 0517 01/13/25 0851 01/13/25 0931  BP: 132/76 (!) 143/81  (!) 146/82  Pulse: 76 (!) 110    Resp: 15 15    Temp: 99.7 F (37.6 C) 98.2 F (36.8 C)    TempSrc:      SpO2: 97% 95%    Weight:      Height:      PainSc: 0-No pain  0-No pain    Body mass index is 26.26 kg/m.   Net IO Since Admission: 3,479.28 mL [01/13/25 1121]  Filed Weights   01/10/25 2317  Weight: 92.8 kg    General: Age-appropriate, hemodynamically stable. HEENT: Normocephalic, atraumatic, no JVP Lungs: Clear to auscultation bilaterally no wheezes rales or rhonchi's Heart: Irregularly irregular, variable S1-S2 Abdomen: Soft, nontender, distended, positive bowel  sounds Extremities: No lower extremity swelling Neuro: Nonfocal  EKG: (personally reviewed by me) No new tracing  Telemetry: (personally reviewed by me) Atrial flutter with episodes of CVR and RVR   Impression & Recommendations: :  Newly discovered atrial flutter. Thyroid  disease Borderline dilatation of aortic root/ascending aorta Hypertension. MGUS. Bilateral sensorineural hearing loss  With regards to newly discovered atrial flutter patient's ventricular rates are better controlled.  Agree with uptitration of Lopressor  to 25 mg p.o. twice daily and continue to uptitrate as per ventricular rate and blood pressure response.  Consolidate to once a day dosing prior to discharge.  Patient transition to Eliquis  5 mg p.o. twice daily.  Would recommend rate control strategy until his GI workup is complete.  The plan to undergo repeat EGD in 2 months to reevaluate disease progression.  Recommend close outpatient follow-up with A-fib clinic and primary cardiologist  Patient also encouraged to follow-up with his primary care and consider endocrinology follow-up for thyroid  disease.  Follow-up echocardiogram in 1 year January 2027 reevaluate borderline aortic root and ascending aorta dilatation.  Remainder of management per primary team  Follow-up appointments have been made.  Cardiology will follow on as needed basis.  Please reach out if any questions or concerns arise.  This note was created using a voice recognition software as a result there may be grammatical errors inadvertently enclosed that do not reflect the nature of this encounter. Every attempt is made to correct such errors.   Madonna Michele HAS, [facility]  A Division of [facility] [address], [place], [place] [address]  01/13/2025 11:21 AM    "

## 2025-01-14 ENCOUNTER — Other Ambulatory Visit (HOSPITAL_COMMUNITY): Payer: Self-pay

## 2025-01-14 ENCOUNTER — Encounter (HOSPITAL_COMMUNITY): Payer: Self-pay | Admitting: Internal Medicine

## 2025-01-14 DIAGNOSIS — M3130 Wegener's granulomatosis without renal involvement: Secondary | ICD-10-CM

## 2025-01-14 LAB — BASIC METABOLIC PANEL WITH GFR
Anion gap: 9 (ref 5–15)
BUN: 10 mg/dL (ref 8–23)
CO2: 27 mmol/L (ref 22–32)
Calcium: 8.9 mg/dL (ref 8.9–10.3)
Chloride: 103 mmol/L (ref 98–111)
Creatinine, Ser: 1.05 mg/dL (ref 0.61–1.24)
GFR, Estimated: >60 mL/min
Glucose, Bld: 113 mg/dL — ABNORMAL HIGH (ref 70–99)
Potassium: 4.3 mmol/L (ref 3.5–5.1)
Sodium: 138 mmol/L (ref 135–145)

## 2025-01-14 LAB — CBC WITH DIFFERENTIAL/PLATELET
Abs Immature Granulocytes: 0.12 10*3/uL — ABNORMAL HIGH (ref 0.00–0.07)
Basophils Absolute: 0.1 10*3/uL (ref 0.0–0.1)
Basophils Relative: 1 %
Eosinophils Absolute: 0.1 10*3/uL (ref 0.0–0.5)
Eosinophils Relative: 1 %
HCT: 39.6 % (ref 39.0–52.0)
Hemoglobin: 12.9 g/dL — ABNORMAL LOW (ref 13.0–17.0)
Immature Granulocytes: 1 %
Lymphocytes Relative: 7 %
Lymphs Abs: 0.9 10*3/uL (ref 0.7–4.0)
MCH: 32.3 pg (ref 26.0–34.0)
MCHC: 32.6 g/dL (ref 30.0–36.0)
MCV: 99 fL (ref 80.0–100.0)
Monocytes Absolute: 1.2 10*3/uL — ABNORMAL HIGH (ref 0.1–1.0)
Monocytes Relative: 9 %
Neutro Abs: 10.9 10*3/uL — ABNORMAL HIGH (ref 1.7–7.7)
Neutrophils Relative %: 81 %
Platelets: 237 10*3/uL (ref 150–400)
RBC: 4 MIL/uL — ABNORMAL LOW (ref 4.22–5.81)
RDW: 15.3 % (ref 11.5–15.5)
WBC: 13.3 10*3/uL — ABNORMAL HIGH (ref 4.0–10.5)
nRBC: 0 % (ref 0.0–0.2)

## 2025-01-14 LAB — URINE CULTURE: Culture: >=100000 — AB

## 2025-01-14 LAB — GLUCOSE, CAPILLARY
Glucose-Capillary: 123 mg/dL — ABNORMAL HIGH (ref 70–99)
Glucose-Capillary: 124 mg/dL — ABNORMAL HIGH (ref 70–99)
Glucose-Capillary: 130 mg/dL — ABNORMAL HIGH (ref 70–99)

## 2025-01-14 LAB — MAGNESIUM: Magnesium: 2.3 mg/dL (ref 1.7–2.4)

## 2025-01-14 MED ORDER — APIXABAN 5 MG PO TABS
5.0000 mg | ORAL_TABLET | Freq: Two times a day (BID) | ORAL | 0 refills | Status: AC
  Filled 2025-01-14: qty 60, 30d supply, fill #0

## 2025-01-14 MED ORDER — PANTOPRAZOLE SODIUM 40 MG PO TBEC
80.0000 mg | DELAYED_RELEASE_TABLET | Freq: Two times a day (BID) | ORAL | 0 refills | Status: AC
  Filled 2025-01-14: qty 120, 30d supply, fill #0

## 2025-01-14 MED ORDER — CEPHALEXIN 500 MG PO CAPS
500.0000 mg | ORAL_CAPSULE | Freq: Three times a day (TID) | ORAL | 0 refills | Status: AC
  Filled 2025-01-14: qty 9, 3d supply, fill #0

## 2025-01-14 MED ORDER — SUCRALFATE 1 G PO TABS
1.0000 g | ORAL_TABLET | Freq: Two times a day (BID) | ORAL | 0 refills | Status: AC
  Filled 2025-01-14: qty 60, 30d supply, fill #0

## 2025-01-14 MED ORDER — CEPHALEXIN 500 MG PO CAPS: 500.0000 mg | ORAL_CAPSULE | Freq: Three times a day (TID) | ORAL | Status: DC

## 2025-01-14 NOTE — Assessment & Plan Note (Addendum)
 Acute blood loss anemia.  01/27 EGD with normal esophagus, medium sized hiatal hernia, non bleeding gastric ulcers with clean ulcer base, (Forrest Class III) biopsied Normal examined duodenum.  Plan to continue pantoprazole  and sucralfate .  Continue anticoagulation with apixaban .  Follow up as outpatient biopsies, for H Pylori.  Repeat EGD in 2 months as outpatient.   Discharge hgb is 12.9, with plt 237

## 2025-01-14 NOTE — Assessment & Plan Note (Signed)
 Present on admission, no sepsis  Urine culture positive for E coli, pending sensitivities.  Patient was placed on IV ceftriaxone   At the time of discharge wbc is trending down to 13.3  He has been afebrile.   Plan to continue cephalexin  for 3 more days

## 2025-01-14 NOTE — Assessment & Plan Note (Signed)
 Follow up as outpatient

## 2025-01-14 NOTE — TOC Transition Note (Signed)
 Transition of Care [facility]) - Discharge Note   Patient Details  Name: Shawn [name] MRN: [id number] Date of Birth: 31-Jan-1961  [facility] CM/SW Contact:  Sonda Manuella Quill, RN Phone Number: 01/14/2025, 10:25 AM   Clinical Narrative:    D/C orders received; no IP CM needs.   Final next level of care: Home/Self Care Barriers to Discharge: No Barriers Identified   Patient Goals and CMS Choice Patient states their goals for this hospitalization and ongoing recovery are:: Home CMS Medicare.gov Compare Post Acute Care list provided to:: Patient Represenative (must comment) (Friend)   [place] ownership interest in [facility].provided to:: Spouse    Discharge Placement                       Discharge Plan and Services Additional resources added to the After Visit Summary for     Discharge Planning Services: CM Consult            DME Arranged: N/A DME Agency: NA       HH Arranged: NA HH Agency: NA        Social Drivers of Health (SDOH) Interventions SDOH Screenings   Food Insecurity: No Food Insecurity (01/11/2025)  Housing: Low Risk (01/11/2025)  Transportation Needs: No Transportation Needs (01/11/2025)  Utilities: Not At Risk (01/11/2025)  Financial Resource Strain: Low Risk (05/13/2024)   Received from [facility]  Social Connections: Socially Integrated (01/11/2025)  Tobacco Use: Medium Risk (01/11/2025)     Readmission Risk Interventions     No data to display

## 2025-01-14 NOTE — Hospital Course (Signed)
 Shawn [name] was admitted to the hospital with the working diagnosis of atrial fibrillation.   64 yo male with the past medical history of chronic right ankle wound, T2DM, Wegener's granulomatosis who presented with nausea, vomiting and diarrhea.  Acute onset of nausea and vomiting, including hematemesis, prompting to call EMS. He was found in pain and was transported to the ED.  On his initial physical examination he was found in atrial flutter with rapid ventricular response, blood pressure 126/76, HR 82, RR 15 and 02 saturation 95% on room air Lungs with no wheezing or rhonchi, heart with S1 and S2 present and regular with no gallops or rubs, abdomen with no distention and no lower extremity edema.   Na 139, K 3.9 Cl 103 bicarbonate 26 glucos3 92 bun 14 cr 0,92 Mg 2.1  Wbc 15.8 hgb 14.3 plr 275  Urine analysis SG 1,017, protein 30, positive nitrates, large leukocytes, moderate hgb, rbc 21-50, wbc > 50  Urine cultures positive for >100,000 CFU E coli  GI panel negative  EKG 88 bpm, normal axis, normal intervals, qtc 453, atrial flutter with variable block, no ST segment or T wave changes.   Chest radiograph with no cardiomegaly, no infiltrates, small left pleural effusion.  CT abdomen and pelvis with no acute findings.   Patient was placed on AV blockade for rate control. Anticoagulation with IV heparin   EGD with no active bleeding.  Placed on antiacids.  01/29 transitioned to direct oral anticoagulant with apixaban .

## 2025-01-14 NOTE — Assessment & Plan Note (Addendum)
 Echocardiogram with preserved LV systolic function EF 55 to 60%, mild LVH, RV systolic function preserved, no significant valvular dysfunction  Patient was place on AV blockade with, metoprolol  succinate 50 mg po daily  Anticoagulation with apixaban .  Plan to follow up as outpatient.

## 2025-01-14 NOTE — Assessment & Plan Note (Addendum)
 Patient was placed on insulin  sliding scale for glucose cover and monitoring  Glucose remained well controlled.  At discharge resume glipizide.

## 2025-01-14 NOTE — Assessment & Plan Note (Signed)
 Continue blood pressure control with metoprolol.

## 2025-01-14 NOTE — Assessment & Plan Note (Signed)
 Plan to continue methotrexate  and prednisone   Continue gabapentin   Continue right ankle wound care.

## 2025-01-14 NOTE — Assessment & Plan Note (Signed)
 Low TSH 0,171 but normal free T4 1,43, plan for follow up thyroid  function as outpatient

## 2025-01-14 NOTE — Progress Notes (Signed)
 Discharge meds in a secure bag delivered to patient by this RN

## 2025-01-15 LAB — CULTURE, BLOOD (ROUTINE X 2)
Culture: NO GROWTH
Culture: NO GROWTH
Special Requests: ADEQUATE
Special Requests: ADEQUATE

## 2025-01-16 ENCOUNTER — Encounter (HOSPITAL_BASED_OUTPATIENT_CLINIC_OR_DEPARTMENT_OTHER): Admitting: General Surgery

## 2025-01-16 LAB — SURGICAL PATHOLOGY

## 2025-01-17 ENCOUNTER — Encounter (HOSPITAL_BASED_OUTPATIENT_CLINIC_OR_DEPARTMENT_OTHER): Admitting: General Surgery

## 2025-01-18 LAB — CULTURE, BLOOD (ROUTINE X 2)
Culture: NO GROWTH
Culture: NO GROWTH
Special Requests: ADEQUATE

## 2025-01-19 ENCOUNTER — Encounter (HOSPITAL_BASED_OUTPATIENT_CLINIC_OR_DEPARTMENT_OTHER): Admitting: General Surgery

## 2025-01-19 ENCOUNTER — Other Ambulatory Visit (HOSPITAL_COMMUNITY): Payer: Self-pay

## 2025-01-20 ENCOUNTER — Other Ambulatory Visit (HOSPITAL_COMMUNITY): Payer: Self-pay

## 2025-01-24 ENCOUNTER — Encounter (HOSPITAL_BASED_OUTPATIENT_CLINIC_OR_DEPARTMENT_OTHER): Admitting: General Surgery

## 2025-01-31 ENCOUNTER — Encounter (HOSPITAL_BASED_OUTPATIENT_CLINIC_OR_DEPARTMENT_OTHER): Admitting: General Surgery

## 2025-02-01 ENCOUNTER — Ambulatory Visit (HOSPITAL_COMMUNITY): Admitting: Nurse Practitioner

## 2025-02-07 ENCOUNTER — Encounter (HOSPITAL_BASED_OUTPATIENT_CLINIC_OR_DEPARTMENT_OTHER): Admitting: General Surgery

## 2025-02-13 ENCOUNTER — Ambulatory Visit: Admitting: Gastroenterology

## 2025-03-23 ENCOUNTER — Other Ambulatory Visit: Payer: PRIVATE HEALTH INSURANCE

## 2025-04-06 ENCOUNTER — Ambulatory Visit: Payer: PRIVATE HEALTH INSURANCE | Admitting: Hematology and Oncology

## 2025-04-11 ENCOUNTER — Ambulatory Visit: Admitting: Internal Medicine

## 2025-06-01 ENCOUNTER — Other Ambulatory Visit: Payer: PRIVATE HEALTH INSURANCE

## 2025-06-07 ENCOUNTER — Ambulatory Visit: Payer: PRIVATE HEALTH INSURANCE | Admitting: Urology

## 2025-07-27 ENCOUNTER — Other Ambulatory Visit: Payer: PRIVATE HEALTH INSURANCE

## 2025-08-03 ENCOUNTER — Telehealth: Payer: PRIVATE HEALTH INSURANCE | Admitting: Hematology and Oncology
